# Patient Record
Sex: Female | Born: 1945 | Race: White | Hispanic: No | Marital: Married | Smoking: Never smoker
Health system: Southern US, Community
[De-identification: ages and names within clinical notes are randomized; demographics above are authoritative.]

## PROBLEM LIST (undated history)

## (undated) DIAGNOSIS — R7301 Impaired fasting glucose: Secondary | ICD-10-CM

## (undated) DIAGNOSIS — E782 Mixed hyperlipidemia: Secondary | ICD-10-CM

## (undated) DIAGNOSIS — I10 Essential (primary) hypertension: Secondary | ICD-10-CM

## (undated) DIAGNOSIS — D509 Iron deficiency anemia, unspecified: Secondary | ICD-10-CM

## (undated) DIAGNOSIS — Z972 Presence of dental prosthetic device (complete) (partial): Secondary | ICD-10-CM

## (undated) DIAGNOSIS — K219 Gastro-esophageal reflux disease without esophagitis: Secondary | ICD-10-CM

## (undated) DIAGNOSIS — R112 Nausea with vomiting, unspecified: Secondary | ICD-10-CM

## (undated) DIAGNOSIS — M1990 Unspecified osteoarthritis, unspecified site: Secondary | ICD-10-CM

## (undated) DIAGNOSIS — M4800 Spinal stenosis, site unspecified: Secondary | ICD-10-CM

## (undated) DIAGNOSIS — Z9889 Other specified postprocedural states: Secondary | ICD-10-CM

## (undated) DIAGNOSIS — H269 Unspecified cataract: Secondary | ICD-10-CM

## (undated) DIAGNOSIS — M549 Dorsalgia, unspecified: Secondary | ICD-10-CM

## (undated) DIAGNOSIS — I639 Cerebral infarction, unspecified: Secondary | ICD-10-CM

## (undated) DIAGNOSIS — G8929 Other chronic pain: Secondary | ICD-10-CM

## (undated) HISTORY — PX: FRACTURE SURGERY: SHX138

## (undated) HISTORY — DX: Iron deficiency anemia, unspecified: D50.9

## (undated) HISTORY — DX: Mixed hyperlipidemia: E78.2

## (undated) HISTORY — DX: Essential (primary) hypertension: I10

## (undated) HISTORY — DX: Impaired fasting glucose: R73.01

## (undated) HISTORY — DX: Gastro-esophageal reflux disease without esophagitis: K21.9

## (undated) HISTORY — PX: TONSILLECTOMY: SUR1361

## (undated) HISTORY — DX: Unspecified osteoarthritis, unspecified site: M19.90

## (undated) HISTORY — DX: Spinal stenosis, site unspecified: M48.00

## (undated) HISTORY — PX: APPENDECTOMY: SHX54

---

## 1980-11-16 HISTORY — PX: ABDOMINAL HYSTERECTOMY: SHX81

## 2000-08-09 ENCOUNTER — Encounter: Payer: Self-pay | Admitting: Obstetrics and Gynecology

## 2000-08-09 ENCOUNTER — Encounter: Admission: RE | Admit: 2000-08-09 | Discharge: 2000-08-09 | Payer: Self-pay | Admitting: Obstetrics and Gynecology

## 2003-08-07 ENCOUNTER — Ambulatory Visit (HOSPITAL_COMMUNITY): Admission: RE | Admit: 2003-08-07 | Discharge: 2003-08-07 | Payer: Self-pay | Admitting: Family Medicine

## 2003-08-07 ENCOUNTER — Encounter: Payer: Self-pay | Admitting: Family Medicine

## 2004-07-07 ENCOUNTER — Ambulatory Visit (HOSPITAL_COMMUNITY): Admission: RE | Admit: 2004-07-07 | Discharge: 2004-07-07 | Payer: Self-pay | Admitting: Family Medicine

## 2004-07-10 ENCOUNTER — Encounter: Admission: RE | Admit: 2004-07-10 | Discharge: 2004-07-10 | Payer: Self-pay | Admitting: Family Medicine

## 2005-02-25 ENCOUNTER — Ambulatory Visit (HOSPITAL_COMMUNITY): Admission: RE | Admit: 2005-02-25 | Discharge: 2005-02-25 | Payer: Self-pay | Admitting: Family Medicine

## 2005-04-14 ENCOUNTER — Ambulatory Visit (HOSPITAL_COMMUNITY): Admission: RE | Admit: 2005-04-14 | Discharge: 2005-04-14 | Payer: Self-pay | Admitting: Family Medicine

## 2005-04-16 ENCOUNTER — Ambulatory Visit (HOSPITAL_COMMUNITY): Admission: RE | Admit: 2005-04-16 | Discharge: 2005-04-16 | Payer: Self-pay | Admitting: Family Medicine

## 2005-07-17 ENCOUNTER — Encounter: Admission: RE | Admit: 2005-07-17 | Discharge: 2005-07-17 | Payer: Self-pay | Admitting: Family Medicine

## 2006-07-21 ENCOUNTER — Encounter: Admission: RE | Admit: 2006-07-21 | Discharge: 2006-07-21 | Payer: Self-pay | Admitting: Family Medicine

## 2006-09-10 ENCOUNTER — Ambulatory Visit (HOSPITAL_COMMUNITY): Admission: RE | Admit: 2006-09-10 | Discharge: 2006-09-10 | Payer: Self-pay | Admitting: Family Medicine

## 2006-09-20 ENCOUNTER — Emergency Department (HOSPITAL_COMMUNITY): Admission: EM | Admit: 2006-09-20 | Discharge: 2006-09-20 | Payer: Self-pay | Admitting: Emergency Medicine

## 2007-08-09 ENCOUNTER — Encounter: Admission: RE | Admit: 2007-08-09 | Discharge: 2007-08-09 | Payer: Self-pay | Admitting: Family Medicine

## 2008-04-16 HISTORY — PX: ESOPHAGOGASTRODUODENOSCOPY: SHX1529

## 2008-04-16 HISTORY — PX: COLONOSCOPY: SHX174

## 2008-04-23 ENCOUNTER — Encounter: Admission: RE | Admit: 2008-04-23 | Discharge: 2008-04-23 | Payer: Self-pay | Admitting: Orthopedic Surgery

## 2008-05-01 ENCOUNTER — Ambulatory Visit: Payer: Self-pay | Admitting: Gastroenterology

## 2008-05-01 ENCOUNTER — Ambulatory Visit (HOSPITAL_COMMUNITY): Admission: RE | Admit: 2008-05-01 | Discharge: 2008-05-01 | Payer: Self-pay | Admitting: Internal Medicine

## 2008-05-11 ENCOUNTER — Encounter: Payer: Self-pay | Admitting: Internal Medicine

## 2008-05-11 ENCOUNTER — Ambulatory Visit (HOSPITAL_COMMUNITY): Admission: RE | Admit: 2008-05-11 | Discharge: 2008-05-11 | Payer: Self-pay | Admitting: Internal Medicine

## 2008-05-11 ENCOUNTER — Ambulatory Visit: Payer: Self-pay | Admitting: Internal Medicine

## 2008-06-15 ENCOUNTER — Encounter (HOSPITAL_COMMUNITY): Admission: RE | Admit: 2008-06-15 | Discharge: 2008-07-15 | Payer: Self-pay | Admitting: Internal Medicine

## 2008-06-19 ENCOUNTER — Ambulatory Visit: Payer: Self-pay | Admitting: Internal Medicine

## 2008-08-09 ENCOUNTER — Encounter: Admission: RE | Admit: 2008-08-09 | Discharge: 2008-08-09 | Payer: Self-pay | Admitting: Family Medicine

## 2008-11-15 ENCOUNTER — Ambulatory Visit (HOSPITAL_COMMUNITY): Admission: RE | Admit: 2008-11-15 | Discharge: 2008-11-16 | Payer: Self-pay | Admitting: Specialist

## 2008-11-16 HISTORY — PX: BACK SURGERY: SHX140

## 2009-04-05 ENCOUNTER — Encounter (INDEPENDENT_AMBULATORY_CARE_PROVIDER_SITE_OTHER): Payer: Self-pay | Admitting: *Deleted

## 2009-11-28 ENCOUNTER — Encounter: Admission: RE | Admit: 2009-11-28 | Discharge: 2009-11-28 | Payer: Self-pay | Admitting: Internal Medicine

## 2010-12-06 ENCOUNTER — Other Ambulatory Visit: Payer: Self-pay | Admitting: Internal Medicine

## 2010-12-06 DIAGNOSIS — Z1239 Encounter for other screening for malignant neoplasm of breast: Secondary | ICD-10-CM

## 2010-12-07 ENCOUNTER — Encounter: Payer: Self-pay | Admitting: Family Medicine

## 2011-01-16 ENCOUNTER — Ambulatory Visit
Admission: RE | Admit: 2011-01-16 | Discharge: 2011-01-16 | Disposition: A | Discharge status: Home / Self Care | Payer: Medicare Other | Source: Ambulatory Visit | Attending: Internal Medicine | Admitting: Internal Medicine

## 2011-01-16 DIAGNOSIS — Z1239 Encounter for other screening for malignant neoplasm of breast: Secondary | ICD-10-CM

## 2011-01-26 ENCOUNTER — Emergency Department (HOSPITAL_COMMUNITY): Payer: Medicare Other

## 2011-01-26 ENCOUNTER — Inpatient Hospital Stay (HOSPITAL_COMMUNITY)
Admission: EM | Admit: 2011-01-26 | Discharge: 2011-01-27 | DRG: 287 | Disposition: A | Discharge status: Home / Self Care | Payer: Medicare Other | Attending: Cardiovascular Disease | Admitting: Cardiovascular Disease

## 2011-01-26 ENCOUNTER — Inpatient Hospital Stay: Admission: AD | Admit: 2011-01-26 | Payer: Self-pay | Source: Ambulatory Visit | Admitting: Cardiovascular Disease

## 2011-01-26 DIAGNOSIS — R0789 Other chest pain: Principal | ICD-10-CM | POA: Diagnosis present

## 2011-01-26 DIAGNOSIS — E669 Obesity, unspecified: Secondary | ICD-10-CM | POA: Diagnosis present

## 2011-01-26 DIAGNOSIS — E785 Hyperlipidemia, unspecified: Secondary | ICD-10-CM | POA: Diagnosis present

## 2011-01-26 DIAGNOSIS — Z8249 Family history of ischemic heart disease and other diseases of the circulatory system: Secondary | ICD-10-CM

## 2011-01-26 LAB — MRSA PCR SCREENING: MRSA by PCR: NEGATIVE

## 2011-01-26 LAB — COMPREHENSIVE METABOLIC PANEL
AST: 19 U/L (ref 0–37)
Alkaline Phosphatase: 73 U/L (ref 39–117)
BUN: 17 mg/dL (ref 6–23)
CO2: 25 mEq/L (ref 19–32)
Chloride: 103 mEq/L (ref 96–112)
GFR calc non Af Amer: >60 mL/min (ref >60)
Glucose, Bld: 100 mg/dL — ABNORMAL HIGH (ref 70–99)
Sodium: 137 mEq/L (ref 135–145)
Total Protein: 7.2 g/dL (ref 6.0–8.3)

## 2011-01-26 LAB — BASIC METABOLIC PANEL
Chloride: 107 mEq/L (ref 96–112)
GFR calc Af Amer: >60 mL/min (ref >60)
Sodium: 138 mEq/L (ref 135–145)

## 2011-01-26 LAB — DIFFERENTIAL
Eosinophils Absolute: 0 10*3/uL (ref 0.0–0.7)
Lymphocytes Relative: 30 % (ref 12–46)
Lymphs Abs: 1.6 10*3/uL (ref 0.7–4.0)
Monocytes Relative: 6 % (ref 3–12)
Neutro Abs: 3.3 10*3/uL (ref 1.7–7.7)
Neutrophils Relative %: 63 % (ref 43–77)

## 2011-01-26 LAB — CK TOTAL AND CKMB (NOT AT ARMC): Total CK: 77 U/L (ref 7–177)

## 2011-01-26 LAB — POCT CARDIAC MARKERS
CKMB, poc: <1 ng/mL — ABNORMAL LOW (ref 1.0–8.0)
Myoglobin, poc: 55.9 ng/mL (ref 12–200)
Troponin i, poc: <0.05 ng/mL (ref 0.00–0.09)

## 2011-01-26 LAB — CBC
HCT: 37.2 % (ref 36.0–46.0)
Hemoglobin: 12.2 g/dL (ref 12.0–15.0)
MCH: 27.2 pg (ref 26.0–34.0)
MCHC: 32.8 g/dL (ref 30.0–36.0)

## 2011-01-26 LAB — CARDIAC PANEL(CRET KIN+CKTOT+MB+TROPI): CK, MB: 1.5 ng/mL (ref 0.3–4.0)

## 2011-01-27 LAB — PROTIME-INR
INR: 1 (ref 0.00–1.49)
Prothrombin Time: 13.4 seconds (ref 11.6–15.2)

## 2011-01-27 LAB — CBC
Hemoglobin: 11.6 g/dL — ABNORMAL LOW (ref 12.0–15.0)
MCH: 26.6 pg (ref 26.0–34.0)
RBC: 4.36 MIL/uL (ref 3.87–5.11)

## 2011-01-27 LAB — CARDIAC PANEL(CRET KIN+CKTOT+MB+TROPI)
Relative Index: INVALID (ref 0.0–2.5)
Total CK: 53 U/L (ref 7–177)
Troponin I: <0.01 ng/mL (ref 0.00–0.06)

## 2011-01-27 LAB — BASIC METABOLIC PANEL
CO2: 24 mEq/L (ref 19–32)
Calcium: 8.7 mg/dL (ref 8.4–10.5)
GFR calc Af Amer: >60 mL/min (ref >60)
GFR calc non Af Amer: >60 mL/min (ref >60)
Sodium: 136 mEq/L (ref 135–145)

## 2011-01-28 NOTE — Procedures (Signed)
[name], [name] NO.:  [id number]  MEDICAL RECORD NO.:  [id number]          PATIENT TYPE:  LOCATION:                                 FACILITY:  PHYSICIAN:  Landry Corporal, M DDATE OF BIRTH:  [birth year]  DATE OF PROCEDURE:  01/27/2011 DATE OF DISCHARGE:                           CARDIAC CATHETERIZATION   REFERRING CARDIOLOGIST:  Gerlene Burdock A. Alanda Amass, MD  PRIMARY CARE PHYSICIAN:  Shelva Majestic, MD  PERFORMING PHYSICIAN:  Landry Corporal, MD  PROCEDURE PERFORMED: 1. Left heart catheterization via 5-French right radial access. 2. Left ventriculogram in the RAO projection with 10 mL/sec for 30     seconds. 3. Native coronary angiography.  INDICATIONS:  Chest pain concerning for unstable angina.  BRIEF HISTORY:  [name] is a pleasant 65 year old woman with borderline hyperlipidemia, borderline hyperglycemia/diabetes and hypertension, who was transferred from [facility] after being evaluated by Dr. Susa Griffins in consultation for episodes of chest pain at rest and minimal exertion over the last several days.  These symptoms were concerning for unstable angina and therefore she was referred for diagnostic cardiac catheterization.  The risks, benefits, alternatives, indications of the procedure were explained to the patient in detail and informed consent was obtained and signed was placed on the chart.  PROCEDURE:  The patient was brought to the [facility] in the fasting state.  She was prepped and draped in usual sterile fashion.  After a modified Allen's / Barbeau test was performed to evaluate for collateral circulation to the right hand via the ulnar artery.  As this was deemed to be sufficient collateral flow, the right radial artery access site was prepped.  A time-out period was performed and the patient was sedated with intravenous Versed and fentanyl.  The right radial artery  was then accessed using the Seldinger technique with placement of 5-French sheath.  Sheath was then aspirated, flushed and then infiltrated with total of 10 mL of saline with radial cocktail.  An ACT was checked prior to doing this and was noted to be 128 seconds and therefore a total of 45 units of intravenous heparin was administered at the time of sheath placement.  After the heparin was administered, a 5-French TIG 4-L catheter was advanced over the safety J- wire into the ascending aorta.  It was then used to engage the right coronary artery and the catheter was then redirected and then the left main coronary artery and multiple angiographic views of the both main coronary artery system were obtained.  After completion of the left ventricular angiography, the catheter was exchanged over wire for a 6- French straight pigtail catheter which was advanced over the wire across the aortic valve for measurement left ventricular hemodynamics.  After the hemodynamics were measured, the left ventriculogram was performed in the RAO projection.  The catheter was then aspirated and flushed and left ventricular hemodynamics were remeasured and the catheter was pulled back across the aortic valve for measuring pullback gradient.  The catheter was then removed completely out of the body over wire without any complications.  A  TR band was placed in the cath lab initially it was placed.  There was noted to be a proximal small hematoma forming and the more proximal hemostasis was obtained manually and the TR band was then repositioned more proximally up the forearm and adequate hemostasis was then obtained.  This was monitored in the holding area and was not noted to getting any larger and was actually diminishing in size prior to the patient going to the radial lounge. Otherwise, the patient was stable for during and after the procedure. There was no other significant complications.  Estimated blood  loss less 10 mL.  __________ Sedation 1 mg IV Versed, 200 mcg IV fentanyl.  CONTRAST: 1. 110 mL. 2. 4000 heparin units. 3. Radial cocktail:  400 mcg of nitroglycerin, 2 mL 1% of lidocaine, 5     mg of verapamil and 10 mL of normal saline.  HEMODYNAMICS: 1. Aortic pressure 131/77 mmHg with a mean of 101 mmHg. 2. Left ventricular pressure 135/11 mmHg with EDP of 60/mmHg.  No     pullback gradient. 3. LV gram showed EF showed 60 with no wall motion abnormalities.  ANGIOGRAPHIC FINDINGS: 1. The right coronary artery is a large-caliber dominant vessel gives     rise to posterior ascending artery and extensive posterolateral     branch.  The artery is smooth and large caliber throughout this     entire course to crux and has no significant disease in the entire     vessel system besides diffuse distal minor luminal irregularities. 2. The left main artery is a large-caliber vessel which bifurcates     into an LAD and circumflex branch. 3. The LAD is a large-caliber vessel which tapers down to around the     apex.  It gives rise to a very proximal first diagonal branch which     gives was as above 30% ostial lesion.  There are two small septal     perforators right after this and after the third septal perforator,     there is a second diagonal branch that is relatively small in     caliber, but no significant disease.  The remainder of the LAD     itself has no significant disease.  There is some slight     calcification in the mid vessel at the first diagonal branch. 4. Circumflex is a moderate caliber vessel and the ostial portion in     it bifurcates into two branches.  The first the more dominant of     which is the obtuse marginal which reached down to the inferior     wall of the heart.  The atrioventricular groove circumflex actually     goes in the same distribution as that of obtuse marginal and then     curves back on the AV groove, but does not provide much distal      circulation just to small posterolateral branches  IMPRESSION: 1. No angiographic evidence of any significant obstructive coronary     artery explains her chest pain for admission.  I would likely then     consider non-ACS/nonanginal chest pain as the source for chest     pain.  Consider GI evaluation. 2. Small right forearm hematoma upon initial sheath removal, almost     diminished to nothing shortly thereafter and stable with TR     repositioning.  PLAN:  Likely discharge this afternoon after standard TR band evaluation.  She will follow up with Dr.  Margo Aye and Dr. Alanda Amass or another physician in [facility].          ______________________________ Landry Corporal, MD     DWH/MEDQ  D:  01/27/2011  T:  01/28/2011  Job:  [id number]  cc:   Gerlene Burdock A. Alanda Amass, M.D. Dr. Margo Aye  Electronically Signed by Bryan Lemma MD on 01/28/2011 12:00:51 PM

## 2011-02-09 NOTE — Discharge Summary (Signed)
  [name], May                 ACCOUNT NO.:  [id number]  MEDICAL RECORD NO.:  [id number]           PATIENT TYPE:  I  LOCATION:  6522                         FACILITY:  [facility]  PHYSICIAN:  Nariyah Osias A. Alanda Amass, M.D.DATE OF BIRTH:  [birth year]  DATE OF ADMISSION:  01/26/2011 DATE OF DISCHARGE:  01/27/2011                              DISCHARGE SUMMARY   DISCHARGE DIAGNOSES: 1. Chest pain worrisome for unstable angina, catheterization this     admission revealing minor coronary artery disease with a 30%     diagonal stenosis, but no other significant disease. 2. Good left ventricular function. 3. Dyslipidemia with an LDL of 174. 4. Family history of coronary artery disease.  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  She saw Dr. Alanda Amass in [place] on January 26, 2011, with complaints of chest pain worrisome for unstable angina.  Please see his history and physical for complete details.  She was actually sent from [place] to [facility] for admission for further evaluation.  She was put on heparin and nitrates because she was still having some discomfort.  Her enzymes were negative.  She was sent for diagnostic catheterization January 27, 2011, which was done by Dr. Herbie Baltimore.  I used a right radial artery approach. She had minor disease in the proximal diagonal, but no other significant coronary artery disease.  Her EF was preserved.  She did have a small amount of ecchymosis around the right radial artery site, but had good pulse.  We feel she can be discharged later on the 13th.  LABORATORY DATA:  CK-MB and troponin's were negative x3.  Liver functions were normal.  Sodium 136, potassium 3.8, BUN 18, creatinine 0.85, INR 1.08, white count 5.6, hemoglobin 11.6, hematocrit 36.2, platelets 183, D-dimer is less than 0.22.  Chest x-ray with no acute abnormality.  EKG shows sinus rhythm without acute  changes.  DISPOSITION:  The patient is discharged in stable condition and will follow up with Dr. Margo Aye and Dr. Alanda Amass.  We have added PPI and statin.  Please see med rec for complete discharge medications.  She will see [place] back in the [place] office in a week or two.     [name], P.A.   ______________________________ Pearletha Furl Alanda Amass, M.D.    Lenard Lance  D:  01/27/2011  T:  01/28/2011  Job:  [id number]  cc:   Gerlene Burdock A. Alanda Amass, M.D. Catalina Pizza, M.D.  Electronically Signed by Corine Shelter P.A. on 01/28/2011 03:09:42 PM Electronically Signed by Susa Griffins M.D. on 02/09/2011 01:37:39 PM

## 2011-03-31 NOTE — Op Note (Signed)
[name], [name]                 ACCOUNT NO.:  [id number]   MEDICAL RECORD NO.:  [id number]          PATIENT TYPE:  OIB   LOCATION:  2550                         FACILITY:  [facility]   PHYSICIAN:  Kerrin Champagne, M.D.   DATE OF BIRTH:  [birth year]   DATE OF PROCEDURE:  11/15/2008  DATE OF DISCHARGE:                               OPERATIVE REPORT   PREOPERATIVE DIAGNOSIS:  Large central herniated nucleus pulposus, L4-5.   POSTOPERATIVE DIAGNOSIS:  Large central herniated nucleus pulposus, L4-  5.   PROCEDURE:  Bilateral microdiskectomy, L4-5 with excision of large  herniated nucleus pulposus and extruded fragments.  An operating room  microscope was used during this procedure.   SURGEON:  Kerrin Champagne, M.D.   ASSISTANT:  Wende Neighbors, PA-C.   ANESTHESIA:  General via orotracheal intubation by Dr. Judie Petit  supplemented with local infiltration with Marcaine 0.5% with 1:200,000  epinephrine 10 mL subcu skin, additional 10 mL deep.   FINDINGS:  Bilateral L5 nerve root entrapment and compression secondary  to a large disk herniation at L4-5 with at least 5-8 fragments of disk  material excised.   ESTIMATED BLOOD LOSS:  75 mL.   COMPLICATIONS:  None.   The patient returned to the PACU in good condition.   HISTORY OF PRESENT ILLNESS:  This patient is 65 year old female who has  had no significant history of back pain problems up until approximately  6 weeks ago when she began to experience increasing back pain and  discomfort.  This has worsened over the last 2 weeks to the point where  she was having difficulty with standing or walking any length of time  and now pain with sitting, standing, walking, or lying.  Radiation to  lower extremities.  Clinical exam shows weakness on dorsiflexion of the  feet both sides as well as great toe extension.  She underwent  evaluation by Dr. Burnard Bunting.  MRI study demonstrated a very large  central disk herniation at L4-5 with  compression of the thecal sac  bilateral L5 and bilateral L4 nerve roots.  She was brought to the  operating room because of concerns of rather severe spinal stenosis with  weakness in the lower extremities and concerns of inability to tolerate  pain to nerve compression intractable pain.  Intraoperative findings as  above.   DESCRIPTION OF THE PROCEDURE:  After adequate general anesthesia, the  patient noted to have a rash, felt to be related to muscle relaxer use  during induction.  The patient had standard preoperative antibiotics  with Ancef.  She was rolled to a prone position using a Wilson frame and  reverse Skytron bed.   A standard prep with DuraPrep solution of the lower dorsal spine to the  upper sacral level, draped in the usual manner, iodine Vi-Drape was  used.  Legs were wrapped with TED stockings and PAS hose were used to  prevent DVT.   All pressure points were well padded.  Standard preoperative antibiotics  of Ancef were given.  It was felt that this did not cause  the patient's  rash initially.  Standard preoperative protocol marked in the scheduled  procedure level.  This was carried out marking on the right side with  excellent initials.  Also, a standard intraoperative time-out  identifying the patient, procedure to be performed, and expected  participants, any significant concerns.  The patient had incision made  at the expected L4 level standard superiorly and inferiorly  approximately 1/2 inch to 3/4 of an inch to each direction through the  skin and subcutaneous layers in the midline, carried down to the spinous  process of L4.  The subcu layers infiltrate markings with Marcaine 0.5%  with 1:200,000 epinephrine.  Electrocautery used to incise the  lumbodorsal fascia along the insertion into the lateral aspect of the  spinous process of L4, carried upwards to L3-4, and inferiorly to the  L5.  Clamp placed over the spinous process of L4.  Intraoperative   lateral radiograph demonstrated clamp on the spinous process of L4.  Clamps were then used to elevate the paralumbar muscles off the  posterolateral aspects of the spinous process of L4 and L5, and over the  posterior aspect of the interlaminar region at the L4-5 level on both  sides.  A McCullough retractor was inserted.  Bleeder was controlled  using bipolar monopolar electrocautery.  Using loupe magnification head  lamp initially, a high-speed bur was used to remove the small portion of  the inferior aspect of the lamina of the L4 bilaterally pinning this to  the deeper cortex.   A 2-3 mm Kerrisons were used to resect residual amount of deeper cortex  at the inferior aspect of the lamina on both sides, small amount at the  medial aspect of the facet at the L4-5 level was resected bilaterally  approximately 10-15% on each side to gain access to the spinal canal.  The ligamentum flavum identified and then carefully retracted and  resected off the superior portion of the lamina of L5.  Foraminotomy  performed over both the L5 nerve roots.  Ligamentum flavum was then  resected off the medial aspect of the facet at the L4-5 bilaterally, and  resected at its area of insertion into the ventral aspect of the  inferior portion of the lamina of L4, both bilaterally.  Thecal sac  easily identified and visualized.  Lateral recesses were decompressed  using 2-3 mm Kerrison and resected a small amount of the superior  articular process of L5, both bilaterally within the lateral recess back  to the medial aspect of the pedicle of L5, both sides.  The L5 nerve  root was then able to be identified bilaterally.  The operating room  microscope carefully, draped sterilely, brought into the field.   Under the operating room microscope, beginning on the right side thecal  sac was retracted immediately and carefully using a Penfield 4, and the  L4-5 herniated disc material identified.  An initial incision  was made  in the posterior longitudinal ligament, just teased out of the  subligamentous region using a nerve hook as well as a larger nerve hook  used to remove very large fragments of herniated disc material.  It  appeared to be degenerated here.  The 3 or 4 large fragments were  resected carefully, then freed up the L5 in the lateral aspect of the  thecal sac line falling for better retraction.  Immediately and further  inspection of the disk posteriorly.  Subligamentous wise, the disk was  excised on the right side  on the lamina of the disk space on the right  side due to a disk space narrowing posteriorly.   Large fragments were resected on this side and then probing using large  neuro probe and further fragments that were freed were able to be  introduced into the open laminotomy region resected using  micropituitary.  At this time, thrombin-soaked Gelfoam was placed on the  lateral recess.  Bone wax applied to the bleeding cancellous surface on  the right side at L4-5.  Attention turned to the left side where using  operating room microscope and the L5 nerve root was identified and  retracted immediately along with the lateral aspect of thecal sac at the  L4-5 level.  This noted to be herniated persistently on the left side,  so a 15-blade scalpel was used to incise the disk.  Nerve hook used to  resect free disk material, and that was located on subligamentous plane.  Pituitary rongeur was used to debride the degenerated disc material, and  a rent into the posterior left side of the disk was identified as  posterior annular tear.  This was debrided using the straight  micropituitaries entering the disk space, removing in a small retained  fragments that were loose or free.   Additional exploration of the canal on the left side identified 2 or 3  small fragments of the disk material that remained free on this side.  The L5 nerve root was noted to be exiting without further  nerve  compression.  The L4 nerve root noted to still have some amount of  compression within the neural foramen such that the reflected portion of  the ligamentum flavum was resected using 2-mm Kerrsions up to the area  of fat surrounding the L4 nerve root.  Hockey-stick neuro probe could  then be passed out the right neural foramen and the left neural foramen  of L4 without difficulty.  Following this, then careful inspection of  the canal on the left side demonstrated no further free fragments or  disk material remaining that could be causing nerve root compression.  Bleeder was controlled using bipolar electrocautery and thrombin-soaked  Gelfoam.  Bone wax applied to the bleeding cancellous bone surfaces.  Attention then replaced on the right side and changing to the other side  of the operating table.   Thecal sac was carefully retracted immediately over a very large  fragment that apparently had caused to migrate to this side, was then  removed using a pituitary rongeur.  Additional exploration above the  disk space into the right undersurface of the L4 nerve root identified  some further disk material and was resected using micropituitary.  With  this, there was no further disk material found within the spinal canal,  irrigation was carried out.  Careful inspection of the disk posteriorly  on the right side demonstrated no rents within the disk area.  No area  of entry into the disk space.  The L5 nerve root and L4 nerve root noted  to be exiting without further nerve compression.  Thrombin-soaked  Gelfoam was placed.  Irrigation carried out and the incision was felt to  be completely dry, and the areas of Gelfoam were removed.   Inspection demonstrated no active bleeding at this point.  Irrigation  was carried out.  The incision allowed to fall back into place.  The  lumbar dorsal fascia was then approximated in the midline to the  supraspinous ligament using interrupted 0  Vicryl with UR needle.  Deep  subcutaneous layer approximated with interrupted 0 Vicryl sutures, more  superficial layers with interrupted 2-0 Vicryl sutures, and skin closed  with a running subcu stitch of 4-0 Vicryl.  The patient then had  Dermabond applied, 4 x 4s fixed to the skin with Hypafix tape.  The  patient was then returned to supine position, reactivated, extubated,  and returned to the recovery room in satisfactory condition.  All  instruments and sponge counts were correct.      Kerrin Champagne, M.D.  Electronically Signed     JEN/MEDQ  D:  11/15/2008  T:  11/16/2008  Job:  [id number]

## 2011-03-31 NOTE — Assessment & Plan Note (Signed)
NAMECHRYSTIE, [name]                  CHART#:  [id number]   DATE:  05/01/2008                       DOB:  Mar 29, 1946   CHIEF COMPLAINT:  Right-sided abdominal pain and right back pain, nausea  and vomiting.   PHYSICIAN REQUESTING CONSULTATION:  Melvyn Novas, MD   HISTORY OF PRESENT ILLNESS:  The patient is a 65 year old Caucasian  female who presented for further evaluation of several-month history of  posterior chest pain, right flank pain, and now right-sided abdominal  pain.  She states she has had this pain before back in 2007.  At that  time, she underwent a CT without contrast to rule out kidney stones.  This revealed some minimal right lower lobe atelectasis and duodenal  diverticulum, but no nephrolithiasis or hydronephrosis.  She states that  at that time, the pain lasted about 4 weeks.  In February, she had  recurrence of this pain.  She states this time, the pain has not let up.  She has pain constantly.  It is progressively worsening.  Initially, she  noted it just when she was lying down.  She tends to like to sleep on  her right side, but has been unable to do so.  She has been sleeping in  a recliner for over 2 months now.  Now, pain has been more constant, she  has it during the day, and it is unrelated to position.  She did  initially see a neurosurgeon, Dr. Lajoyce Corners, whom her husband sees.  She  states she had MRI and x-rays, which were unremarkable.  She also had an  abdominal ultrasound, which was negative.  She tried a 2-week course of  pain pills for possible pulled muscle, but this did not work.  She also  was on Celebrex at some point, which did not seem to help.  Over the  last one month, she started having more nausea.  When she wakes up in  the morning, she had a lot of nausea and has to eat crackers.  On one  occasion, she woke up at 1 a.m. and vomited.  She has postprandial  nausea.  For the last 3 to 4 days, it has been a lot worse.  She has  been  having heartburn and ingestion for about a week, which is new.  She  has been taking Alka-Seltzer.  She denies any dysphagia or odynophagia.  She has a bowel movement every morning.  A couple of times, she has had  some loose stools, but nothing significant.  Denies any blood in the  stool or melena.  She is quite frustrated.  She denies any weight loss.   MEDICATIONS:  None.   ALLERGIES:  No known drug allergies.   PAST MEDICAL HISTORY:  Hypercholesterolemia, diet controlled.   PAST SURGICAL HISTORY:  Colonoscopy in December 2001 by Dr. Karilyn Cota.  She  had a sigmoid colon polyp, which was inflammatory.  She has small  external hemorrhoids.  She had a hysterectomy in 1981.  She had surgery  on her arm in 1974.   FAMILY HISTORY:  Two maternal aunts had colon cancer.  Mother is 44 and  has hypertension.  Father died at age 38.  History of MI.   SOCIAL HISTORY:  She is married, has 1 child.  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  Never been a smoker.  No alcohol use.   REVIEW OF SYSTEMS:  See HPI for GI.  CONSTITUTIONAL:  See HPI.  CARDIOPULMONARY:  She complains of chest pain, especially on the right  side with a deep breath.  She really has not had any dyspnea on  exertion.  No cough.  GENITOURINARY:  No dysuria or hematuria.   PHYSICAL EXAMINATION:  VITALS:  Weight 202, height 5 feet 4 inches,  temperature 98.1, blood pressure 118/84, and pulse 72.  GENERAL:  Pleasant, elderly Caucasian female, who is tearful during much  of the exam.  SKIN:  Warm and dry.  No jaundice.  HEENT:  Sclera nonicteric.  Conjunctivae is moist and pink.  CHEST:  Lungs are clear to auscultation.  CARDIAC:  Reveals regular rate and rhythm.  No murmurs, rubs, or  gallops.  BACK:  Cannot reproduce her symptoms with palpation of the back or along  the rib cage margin.  ABDOMEN:  Positive bowel sounds.  Obese.  She has tenderness noted  throughout the right abdomen down the right mid and a little bit down  into  the right pelvis.  Mostly in the right mid and right upper quadrant  region, however.  No rebound or guarding.  No organomegaly or masses.  No abdominal bruits or hernias in the lower extremities.  No edema.   IMPRESSION:  The patient is a 65 year old lady with a 63-month history of  progressive, now constant pain, which is located in the right posterior  chest, right flank, and right side of the abdomen.  Over the last couple  of weeks, she has developed some nausea and one episode of vomiting as  well as reflux symptoms.  I think it will be hard to explain all of her  symptoms based on the upper GI source.  She really denies any urinary  tract symptoms.  Abdominal ultrasound was unremarkable recently.  Etiology not well defined at this point, but I discussed with her today  given the chronicity and progression of these symptoms,  would consider  CT of the chest, abdomen, as well as the pelvis to further evaluate her  pain.  If this was unremarkable, we would pursue EGD prior to any  further evaluation of gallbladder disease.  Her symptoms do not sound  entirely biliary.  She does have some postprandial symptoms, but her  symptoms are not always related to meals.   PLAN:  1. CT of the chest, abdomen, and pelvis.  2. CBC and CMET.  3. Trial of Kapidex 60 mg daily, #27 pills provided.  4. Further recommendations to follow.       Tana Coast, P.A.  Electronically Signed     R. Roetta Sessions, M.D.  Electronically Signed    LL/MEDQ  D:  05/01/2008  T:  05/02/2008  Job:  [id number]   cc:   Melvyn Novas, MD

## 2011-03-31 NOTE — Op Note (Signed)
[name], CORDIAL                 ACCOUNT NO.:  [id number]   MEDICAL RECORD NO.:  [id number]          PATIENT TYPE:  AMB   LOCATION:  DAY                           FACILITY:  [facility]   PHYSICIAN:  R. Roetta Sessions, M.D. DATE OF BIRTH:  Feb 18, 1946   DATE OF PROCEDURE:  05/11/2008  DATE OF DISCHARGE:                               OPERATIVE REPORT   EGD with biopsy followed by ileal colonoscopy and snare polypectomy,  biopsy.   INDICATIONS FOR PROCEDURE:  A 62-year lady with a 47-month history of  progressive right posterior chest, right flank, right-sided abdominal  pain, symptoms gradually worsened.  We have seen her, she underwent a  chest, abdominal and pelvic CT scanning.  Previously underwent an MRI of  spine.  Tiny lesion in liver consistent with hemangioma.  Tiny lung  lesion for which 1 year followup CT was recommended (nonsmoker).  She  tells me that 5 days ago, the above-mentioned symptoms RESOLVED.  She  states, she had similar symptoms back in October which were more short  lived at that time, but for the past 5 days she has not had any symptoms  whatsoever.  She has Kapidex on hand, but has not started taking this  agent as of yet.  She previously had an abdominal ultrasound also which  demonstrated no gallbladder abnormalities.  It has been early 10 years  since she last had a colonoscopy.  EGD and colonoscopy now being done in  part for screening and in part to further evaluate her recent symptoms.  This approach has been discussed with the patient at length.  Risks,  benefits, and alternatives have been reviewed, questions answered.  She  is agreeable.  Please see documentation in medical record.   PROCEDURE NOTE:  O2 saturation, blood pressure, pulse, and respirations  monitored throughout entire procedure.  Conscious sedation, Versed 7 mg,  IV Demerol 125 mg IV in divided doses.   INSTRUMENT:  Pentax video chip system.   FINDINGS:  Examination of the tubular  esophagus revealed a 4-5 mm  circumferential distal esophageal erosions trailing the EG junction.  Esophageal mucosa otherwise appeared normal.  EG junction easily  traversed.  Stomach:  Gastric cavity was emptied and insufflated well  with air.  Thorough examination of the gastric mucosa including  retroflexion at the proximal stomach and esophagogastric junction  demonstrated a small hiatal hernia in 2 x 3 cm of antral scar versus  early adenomatous change.  There are couple of tiny associated erosions  as well.  I did not see anything looking like infiltrating process.  There was no ulcer.  Pylorus patent and easily traversed.  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  Otherwise, duodenal mucosa appeared normal.  Therapeutic/diagnostic maneuvers performed:  The area of scar versus  antrum was biopsied for histologic study.  The patient tolerated the  procedure well and was prepared for colonoscopy.  Digital rectal exam  revealed no abnormalities.  Endoscopic findings:  The prep was good.  Colon:  Colonic  mucosa was surveyed from the rectosigmoid junction  through the left transverse right colon appendiceal orifice, ileocecal  valve, and cecum.  These structures well seen and photographed for the  record.  Terminal ileum was intubated to 10 cm from this level.  The  scope was slowly and cautiously withdrawn.  All previously mentioned  mucosal surfaces were again seen.  The patient was noted to have a 5-mm  polyp at the splenic flexure, which was cold snared, recovered through  the scope.  There was a diminutive polyp at the base of the cecum which  was cold biopsied/removed.  The remainder of the colonic mucosa as well  as terminal ileal mucosa appeared normal.  The scope was pulled down to  the rectum with examination of  rectal mucosa including retroflex view  of the anal verge demonstrated anal papilla and internal  hemorrhoids  only.  The patient tolerated both procedures well and was reactive to  endoscopy.   IMPRESSION:  1. Esophagogastroduodenoscopy.  Circumferential distal esophageal      erosions consistent with mild erosive reflux esophagitis, small      hiatal hernia, antral erosions, any areas of scar versus      adenomatous changes described above status post biopsy.  Otherwise      unremarkable stomach.  D1 and D2 unremarkable aside from D2      diverticulum.  2. Colonoscopy findings:  Anal papilla and internal hemorrhoids,      otherwise normal rectum.  3. Polyp at the splenic flexure removed with snare.  Diminutive polyp      in the cecum removed with cold biopsy technique.  Remainder of the      colonic mucosa, terminal ileum, mucosa appeared normal.   The patient's symptoms have not been present for the past 5 days.  Her  symptoms are atypical for any one GI entity.  She could have occult  gallbladder disease.  She certainly does have an element of erosive  reflux esophagitis.   RECOMMENDATIONS:  1. We will follow up pathology on polyps taken today.  She has Kapidex      on hand, I will ask her to  start taking Kapidex 60 mg orally once      daily.  She has samples for nearly a month.  We will bring her back      in 1 month and see how she is doing as a separate issue.  She will      need chest CT in 1 year to follow up on the most likely benign      lesions seen on recent chest CT scan.  2. Followup appointment with [place] in less than 1 month to see how she is      doing.  3. If she has any recurrence of symptoms, would give some      consideration to HIDA scan with fatty meal challenge.  4. The inflammatory changes in the antrum, they maybe related to      recent course of Celebrex per the record.      Jonathon Bellows, M.D.  Electronically Signed     RMR/MEDQ  D:  05/11/2008  T:  05/12/2008  Job:  [id number]   cc:   Melvyn Novas, MD  Fax: [phone]

## 2011-03-31 NOTE — Assessment & Plan Note (Signed)
[name], [name]                  CHART#:  [id number]   DATE:  06/19/2008                       DOB:  [birth year]   Followup, right flank pain and history of erosive reflux esophagitis.   The patient returns.  She is really doing well.  She took Kapidex for a  week and developed a rash, for which she was given some steroid shots by  Dr. Janna Arch and it resolved.  She is taking her friend's Nexium, with  good control of her symptoms.  Flank pain has resolved.  She had a small  hemangioma on recent CT and nonspecific pulmonary lesions, for which  followup CT in 1 year was recommended.  She has a history of adenomatous  polyps and needs colonoscopy in 5 years.  HIDA scan demonstrated good  gallbladder EF, 63%, no reproduction of symptoms.  Overall, the patient  is doing very well clinically at this point in time.   CURRENT MEDICATIONS:  None.   ALLERGIES:  Most likely now Kapidex but adds Prevacid to the list.  I  doubt she developed the rash related to IV Demerol and Versed for the  endoscopic procedures.   PHYSICAL EXAMINATION:  GENERAL:  Today, appears well.  VITAL SIGNS:  Weight 199, temperature 98.3, BP 118/82, and pulse 64.  SKIN:  Warm and dry.  CHEST:  Lungs are clear to auscultation.  CARDIAC:  Regular rate and rhythm without murmur, gallop, or rub.  ABDOMEN:  Nondistended, positive bowel sounds, soft, and nontender  without appreciable mass or organomegaly.   ASSESSMENT:  1. Etiology of her fleeting right flank pain has never been nailed      down.  However, negative results of multiple studies are      reassuring.  2. She does have erosive reflux esophagitis, needs to be on daily      suppression therapy.  She wants something like Nexium but wants a      generic, therefore we will give her a prescription for omeprazole      20 mg orally daily, #30 with 6 refills.  We will plan to see this      nice lady back in 3 months.  We will slate her for a chest CT in 1  year.  3. Colonoscopy in 5 years.  4. If she has any interim symptoms, she is to let us know immediately.       Jonathon Bellows, M.D.  Electronically Signed     RMR/MEDQ  D:  06/19/2008  T:  06/19/2008  Job:  [id number]   cc:   Melvyn Novas, MD

## 2011-08-21 LAB — URINALYSIS, ROUTINE W REFLEX MICROSCOPIC
Bilirubin Urine: NEGATIVE
Glucose, UA: NEGATIVE mg/dL
Hgb urine dipstick: NEGATIVE
Ketones, ur: NEGATIVE mg/dL
Nitrite: NEGATIVE
Protein, ur: NEGATIVE mg/dL
Specific Gravity, Urine: 1.025 (ref 1.005–1.030)
Urobilinogen, UA: 0.2 mg/dL (ref 0.0–1.0)
pH: 6 (ref 5.0–8.0)

## 2011-08-21 LAB — COMPREHENSIVE METABOLIC PANEL
ALT: 17 U/L (ref 0–35)
AST: 17 U/L (ref 0–37)
Albumin: 4.4 g/dL (ref 3.5–5.2)
Alkaline Phosphatase: 79 U/L (ref 39–117)
BUN: 18 mg/dL (ref 6–23)
CO2: 29 mEq/L (ref 19–32)
Calcium: 9.2 mg/dL (ref 8.4–10.5)
Chloride: 102 mEq/L (ref 96–112)
Creatinine, Ser: 1.03 mg/dL (ref 0.4–1.2)
GFR calc Af Amer: >60 mL/min (ref >60)
GFR calc non Af Amer: 54 mL/min — ABNORMAL LOW (ref >60)
Glucose, Bld: 103 mg/dL — ABNORMAL HIGH (ref 70–99)
Potassium: 3.7 mEq/L (ref 3.5–5.1)
Sodium: 137 mEq/L (ref 135–145)
Total Bilirubin: 0.6 mg/dL (ref 0.3–1.2)
Total Protein: 6.8 g/dL (ref 6.0–8.3)

## 2011-08-21 LAB — URINE MICROSCOPIC-ADD ON

## 2011-08-21 LAB — DIFFERENTIAL
Basophils Absolute: 0 10*3/uL (ref 0.0–0.1)
Lymphocytes Relative: 39 % (ref 12–46)
Monocytes Absolute: 0.5 10*3/uL (ref 0.1–1.0)
Monocytes Relative: 6 % (ref 3–12)
Neutro Abs: 4 10*3/uL (ref 1.7–7.7)

## 2011-08-21 LAB — CBC
HCT: 37.9 % (ref 36.0–46.0)
MCHC: 33.3 g/dL (ref 30.0–36.0)
MCV: 85.1 fL (ref 78.0–100.0)
Platelets: 244 10*3/uL (ref 150–400)
RDW: 14.1 % (ref 11.5–15.5)

## 2011-08-21 LAB — APTT: aPTT: 26 seconds (ref 24–37)

## 2011-08-21 LAB — PROTIME-INR: INR: 0.9 (ref 0.00–1.49)

## 2012-01-11 ENCOUNTER — Other Ambulatory Visit: Payer: Self-pay | Admitting: Internal Medicine

## 2012-01-11 DIAGNOSIS — Z1231 Encounter for screening mammogram for malignant neoplasm of breast: Secondary | ICD-10-CM

## 2012-01-21 ENCOUNTER — Ambulatory Visit
Admission: RE | Admit: 2012-01-21 | Discharge: 2012-01-21 | Disposition: A | Discharge status: Home / Self Care | Payer: Medicare Other | Source: Ambulatory Visit | Attending: Internal Medicine | Admitting: Internal Medicine

## 2012-01-21 DIAGNOSIS — Z1231 Encounter for screening mammogram for malignant neoplasm of breast: Secondary | ICD-10-CM

## 2012-09-19 ENCOUNTER — Other Ambulatory Visit (HOSPITAL_COMMUNITY): Payer: Self-pay | Admitting: Internal Medicine

## 2012-09-19 DIAGNOSIS — M545 Low back pain, unspecified: Secondary | ICD-10-CM

## 2012-09-22 ENCOUNTER — Ambulatory Visit (HOSPITAL_COMMUNITY)
Admission: RE | Admit: 2012-09-22 | Discharge: 2012-09-22 | Disposition: A | Discharge status: Home / Self Care | Payer: Medicare Other | Source: Ambulatory Visit | Attending: Internal Medicine | Admitting: Internal Medicine

## 2012-09-22 DIAGNOSIS — M5137 Other intervertebral disc degeneration, lumbosacral region: Secondary | ICD-10-CM | POA: Insufficient documentation

## 2012-09-22 DIAGNOSIS — M47817 Spondylosis without myelopathy or radiculopathy, lumbosacral region: Secondary | ICD-10-CM | POA: Insufficient documentation

## 2012-09-22 DIAGNOSIS — M51379 Other intervertebral disc degeneration, lumbosacral region without mention of lumbar back pain or lower extremity pain: Secondary | ICD-10-CM | POA: Insufficient documentation

## 2012-09-22 DIAGNOSIS — M545 Low back pain, unspecified: Secondary | ICD-10-CM

## 2012-09-22 LAB — POCT I-STAT, CHEM 8
BUN: 20 mg/dL (ref 6–23)
Calcium, Ion: 1.19 mmol/L (ref 1.13–1.30)
Chloride: 107 mEq/L (ref 96–112)
Creatinine, Ser: 1.1 mg/dL (ref 0.50–1.10)
Glucose, Bld: 108 mg/dL — ABNORMAL HIGH (ref 70–99)
HCT: 37 % (ref 36.0–46.0)

## 2012-09-22 MED ORDER — GADOBENATE DIMEGLUMINE 529 MG/ML IV SOLN
18.0000 mL | Freq: Once | INTRAVENOUS | Status: AC | PRN
  Administered 2012-09-22: 18 mL via INTRAVENOUS

## 2012-09-22 NOTE — Progress Notes (Signed)
Blood sample obtained from left arm IV for Creatnine level.  

## 2012-10-27 ENCOUNTER — Ambulatory Visit (HOSPITAL_COMMUNITY)
Admission: RE | Admit: 2012-10-27 | Discharge: 2012-10-27 | Disposition: A | Discharge status: Home / Self Care | Payer: Medicare Other | Source: Ambulatory Visit | Attending: Neurosurgery | Admitting: Neurosurgery

## 2012-10-27 DIAGNOSIS — R262 Difficulty in walking, not elsewhere classified: Secondary | ICD-10-CM | POA: Insufficient documentation

## 2012-10-27 DIAGNOSIS — M549 Dorsalgia, unspecified: Secondary | ICD-10-CM | POA: Insufficient documentation

## 2012-10-27 DIAGNOSIS — R29898 Other symptoms and signs involving the musculoskeletal system: Secondary | ICD-10-CM | POA: Insufficient documentation

## 2012-10-27 DIAGNOSIS — IMO0001 Reserved for inherently not codable concepts without codable children: Secondary | ICD-10-CM | POA: Insufficient documentation

## 2012-10-27 DIAGNOSIS — M2560 Stiffness of unspecified joint, not elsewhere classified: Secondary | ICD-10-CM | POA: Insufficient documentation

## 2012-10-27 NOTE — Evaluation (Signed)
Physical Therapy Evaluation  Patient Details  Name: [name] MRN: [id number] Date of Birth: Nov 15, 1946  Today's Date: 10/27/2012 Time: 0805-0848 PT Time Calculation (min): 43 min  Visit#: 1  of 8   Re-eval: 11/26/12 Assessment Diagnosis: LBP Next MD Visit: 11/22/11 Prior Therapy: none  Authorization: [employer] medicare complete   Past Medical History: No past medical history on file. Past Surgical History: No past surgical history on file.  Subjective Symptoms/Limitations Symptoms: [name] states that she has been having back pain for the past three months.  The patient had back surgery four yrs ago but did well after that.  She states she has pain on both sides of her back and both of her legs have radicular pain.  She is unable to lie in the bed any longer due to the pain.  She is now staying in a recliner most of the time.  Pertinent History: disectomy L4-5 four years ago.   How long can you sit comfortably?: 5-10 minutes. Pt is unable to travel in a car greater than 30 minutes. (30 minutes she is in severe pain). How long can you stand comfortably?: unable to stand more than two minutes. How long can you walk comfortably?: no more than five minutes. Special Tests: Pt is not sleeping. Pain Assessment Currently in Pain?: Yes Pain Score:   6 (worst 8/10; best 3-4/10) Pain Location: Back Pain Orientation: Right;Left Pain Type: Chronic pain   Assessment RLE Strength Right Hip Flexion: 5/5 Right Hip Extension: 3+/5 Right Hip ABduction: 5/5 Right Hip ADduction: 5/5 Right Knee Flexion: 5/5 Right Knee Extension: 4/5 Right Ankle Dorsiflexion: 5/5 LLE Strength Left Hip Flexion: 3/5 Left Hip Extension: 3+/5 Left Hip ABduction: 5/5 Left Hip ADduction: 4/5 Left Knee Flexion: 4/5 Left Knee Extension: 5/5 Left Ankle Dorsiflexion: 5/5 Lumbar AROM Lumbar Flexion: wnl Lumbar Extension: decreased 50% Lumbar - Right Side Bend: decreased 50% Lumbar - Left Side Bend: decreased  25% Lumbar - Right Rotation: decreased 20% Lumbar - Left Rotation: decreased 30%  Exercise/Treatments    Stretches Single Knee to Chest Stretch: 3 reps;30 seconds Standing Side Bend: 5 reps Standing Extension: 5 reps   Supine Ab Set: 10 reps Other Supine Lumbar Exercises: Kegal x 10  Physical Therapy Assessment and Plan PT Assessment and Plan Clinical Impression Statement: Pt with postural changes and weakened core mm affecting low back pain.  Pt will benefit from skilled physical therapy to improve pt function and quality of life. Pt will benefit from skilled therapeutic intervention in order to improve on the following deficits: Decreased activity tolerance;Decreased range of motion;Decreased strength;Difficulty walking;Impaired flexibility;Pain Rehab Potential: Good PT Frequency: Min 2X/week PT Duration: 4 weeks PT Treatment/Interventions: Therapeutic exercise;Therapeutic activities PT Plan: begin bent knee raise, bridge, clam, and decompressive exercises next treatment.  Pt will need decompressive exercises for home.  Second treatment begin prone heel squeeze, and SLR; third treatment add T-band exercises for posture.    Goals Home Exercise Program Pt will Perform Home Exercise Program: Independently PT Short Term Goals Time to Complete Short Term Goals: 2 weeks PT Short Term Goal 1: no radicular pain past the knee level PT Short Term Goal 1 - Progress: Met PT Short Term Goal 2: Pt to be able to sit for 30 minutes without difficutly PT Short Term Goal 4: Pt to be able to stand x 15 minutes PT Short Term Goal 5: Pt to be able to walk 15 minutes PT Long Term Goals Time to Complete Long Term Goals: 4  weeks PT Long Term Goal 1: No radicular pain in the past week PT Long Term Goal 2: Pt to be able to walk for 30 minutes  Long Term Goal 3: Pt to be I in advance HEP Long Term Goal 4: Pain level to be no greater than a 2 80 % of the day PT Long Term Goal 5: Pt to stand upright, (  currently forward bent 20 degrees)  Problem List Patient Active Problem List  Diagnosis  . Stiffness of joints, not elsewhere classified, multiple sites  . Weakness of both legs  . Difficulty in walking    PT - End of Session Activity Tolerance: Patient tolerated treatment well General Behavior During Session: [facility] for tasks performed Cognition: [facility] for tasks performed PT Plan of Care PT Home Exercise Plan: given  GP Functional Assessment Tool Used: Oswestry Functional Limitation: Mobility: Walking and moving around Mobility: Walking and Moving Around Current Status ([id number]): At least 60 percent but less than 80 percent impaired, limited or restricted Mobility: Walking and Moving Around Goal Status [phone]): At least 20 percent but less than 40 percent impaired, limited or restricted  [name],CINDY 10/27/2012, 12:33 PM  Physician Documentation Your signature is required to indicate approval of the treatment plan as stated above.  Please sign and either send electronically or make a copy of this report for your files and return this physician signed original.   Please mark one 1.__approve of plan  2. ___approve of plan with the following conditions.   ______________________________                                                          _____________________ Physician Signature                                                                                                             Date

## 2012-11-01 ENCOUNTER — Ambulatory Visit (HOSPITAL_COMMUNITY)
Admission: RE | Admit: 2012-11-01 | Discharge: 2012-11-01 | Disposition: A | Discharge status: Home / Self Care | Payer: Medicare Other | Source: Ambulatory Visit | Attending: Neurosurgery | Admitting: Neurosurgery

## 2012-11-01 NOTE — Progress Notes (Signed)
Physical Therapy Treatment Patient Details  Name: [name] MRN: [id number] Date of Birth: 1946-01-21  Today's Date: 11/01/2012 Time: 1324-4010 PT Time Calculation (min): 40 min  Visit#: 2  of 8   Re-eval: 11/26/12 Charges: Therex x 38'  Authorization: [employer] medicare complete    Subjective: Symptoms/Limitations Symptoms: Pt states that she was doing well until Sunday. She states that she was shopping and both legs locked up. Pain Assessment Currently in Pain?: Yes Pain Score:   4 Pain Location: Back Pain Orientation: Right;Left Pain Radiating Towards: B ankles   Exercise/Treatments Stretches Single Knee to Chest Stretch: 3 reps;30 seconds Supine Ab Set: 10 reps Clam: 10 reps Bent Knee Raise: 10 reps Bridge: 10 reps;5 seconds Other Supine Lumbar Exercises: Kegal x 10 Other Supine Lumbar Exercises: Decompression 1-5  Physical Therapy Assessment and Plan PT Assessment and Plan Clinical Impression Statement: Pt presents with decreased mobility in LLE compared to R. Pt completes therex with good core contraction after initial demonstration. Began sciatic nerve glides to decrease adhesions and radicular pain. Pt reports no change in pain at end of session. PT Plan: Next treatment begin prone heel squeeze, and SLR; third treatment add T-band exercises for posture.     Problem List Patient Active Problem List  Diagnosis  . Stiffness of joints, not elsewhere classified, multiple sites  . Weakness of both legs  . Difficulty in walking    PT - End of Session Activity Tolerance: Patient tolerated treatment well General Behavior During Session: [facility] for tasks performed Cognition: [facility] for tasks performed  GP Functional Assessment Tool Used: Oswestry  Seth Bake, PTA 11/01/2012, 11:43 AM

## 2012-11-03 ENCOUNTER — Ambulatory Visit (HOSPITAL_COMMUNITY)
Admission: RE | Admit: 2012-11-03 | Discharge: 2012-11-03 | Disposition: A | Discharge status: Home / Self Care | Payer: Medicare Other | Source: Ambulatory Visit | Attending: Neurosurgery | Admitting: Neurosurgery

## 2012-11-03 NOTE — Progress Notes (Signed)
Physical Therapy Treatment Patient Details  Name: [name] MRN: [id number] Date of Birth: 10-Mar-1946  Today's Date: 11/03/2012 Time: 4098-1191 PT Time Calculation (min): 41 min  Visit#: 3  of 8   Re-eval: 11/26/12 Charges: Therex x 32' Manual x 8'  Authorization: [employer] medicare complete   Subjective: Symptoms/Limitations Symptoms: Pt states that her radicular sx have decreased since last session. Pain Assessment Currently in Pain?: Yes Pain Score:   3 Pain Location: Back Pain Orientation: Right;Left Pain Radiating Towards: B ankles   Exercise/Treatments Stretches Active Hamstring Stretch: 2 reps;30 seconds Single Knee to Chest Stretch: 2 reps;30 seconds Supine Ab Set: 10 reps Clam: 10 reps Bent Knee Raise: 10 reps Bridge: 10 reps;5 seconds Prone  Straight Leg Raise: 10 reps Other Prone Lumbar Exercises: Heel squeeze 10x5"  Manual Therapy Manual Therapy: Other (comment) Other Manual Therapy: Sciatic nerve glides completed to B LE to decrease adhesions and radicular sx  Physical Therapy Assessment and Plan PT Assessment and Plan Clinical Impression Statement: Began scapular tband exercises with multimodal cueing for proper form. Pt completes therex with good core contraction after initial cueing. Completed sciatic nerve glides again this session as they decreased radicular pain after last session. PT Plan: Continue to progress core strength and decrease radicular sx per PT POC.      Problem List Patient Active Problem List  Diagnosis  . Stiffness of joints, not elsewhere classified, multiple sites  . Weakness of both legs  . Difficulty in walking    PT - End of Session Activity Tolerance: Patient tolerated treatment well General Behavior During Session: [facility] for tasks performed Cognition: [facility] for tasks performed  GP Functional Assessment Tool Used: Oswestry  Seth Bake, PTA 11/03/2012, 9:13 AM

## 2012-11-10 ENCOUNTER — Ambulatory Visit (HOSPITAL_COMMUNITY)
Admission: RE | Admit: 2012-11-10 | Discharge: 2012-11-10 | Disposition: A | Discharge status: Home / Self Care | Payer: Medicare Other | Source: Ambulatory Visit | Attending: Neurosurgery | Admitting: Neurosurgery

## 2012-11-10 NOTE — Progress Notes (Signed)
Physical Therapy Treatment Patient Details  Name: [name] MRN: [id number] Date of Birth: [birth year]  Today's Date: 11/10/2012 Time: 4098-1191 PT Time Calculation (min): 39 min  Visit#: 4  of 8   Re-eval: 11/26/12 Authorization: Susa Simmonds medicare complete  Charges:  therex 38'  Subjective: Symptoms/Limitations Symptoms: Pt. states she is not having any radicular symptoms today, pt. is centralized iin LB bilaterally. Pain Assessment Currently in Pain?: Yes Pain Score:   2 Pain Location: Back Pain Orientation: Mid   Exercise/Treatments Stretches Active Hamstring Stretch: 2 reps;30 seconds Single Knee to Chest Stretch: 2 reps;30 seconds Standing Other Standing Lumbar Exercises: postural 3 theraband added 12/19 visit but not done today due to time. Supine Ab Set: 15 reps Clam: 15 reps Bent Knee Raise: 15 reps Bridge: 15 reps Straight Leg Raise: 10 reps Other Supine Lumbar Exercises: hip adduction iso 10X5" Prone  Straight Leg Raise: 10 reps Other Prone Lumbar Exercises: Heel squeeze 10x5"     Physical Therapy Assessment and Plan PT Assessment and Plan Clinical Impression Statement: Unable to complete scapular tband exercises today due to time constraints (pt. requested to be done early due to meeting).  Pt. did not need nerve glides this session due to no radicular symptoms.  Pt. with overall improving form and decreasing symptoms.  PT Plan: Continue to progress core strength and decrease radicular sx per PT POC.  Resume postural tband exercises and add nerve glides as needed for radicular symtoms.      Problem List Patient Active Problem List  Diagnosis  . Stiffness of joints, not elsewhere classified, multiple sites  . Weakness of both legs  . Difficulty in walking    PT - End of Session Activity Tolerance: Patient tolerated treatment well General Behavior During Session: [facility] for tasks performed Cognition: [facility] for tasks performed   Lurena Nida,  PTA/CLT 11/10/2012, 12:01 PM

## 2012-11-15 ENCOUNTER — Ambulatory Visit (HOSPITAL_COMMUNITY)
Admission: RE | Admit: 2012-11-15 | Discharge: 2012-11-15 | Disposition: A | Discharge status: Home / Self Care | Payer: Medicare Other | Source: Ambulatory Visit | Attending: *Deleted | Admitting: *Deleted

## 2012-11-15 NOTE — Progress Notes (Signed)
Physical Therapy Treatment Patient Details  Name: LASEAN RAHMING MRN: [id number] Date of Birth: [birth year]  Today's Date: 11/15/2012 Time: 0802-0845 PT Time Calculation (min): 43 min  Visit#: 5  of 8   Re-eval: 11/26/12 Charges: Therex x 30' Manual x 10'  Authorization: [employer] medicare complete  Authorization Visit#: 5  of 10    Subjective: Symptoms/Limitations Symptoms: Pt describes pain as soreness. Pt reports decreased radicular sx. Pain Assessment Currently in Pain?: Yes Pain Score:   3 Pain Location: Back Pain Orientation: Lower Pain Radiating Towards: B ankles   Exercise/Treatments Supine Ab Set: 15 reps Bent Knee Raise: 15 reps Bridge: 15 reps Straight Leg Raise: 10 reps Other Supine Lumbar Exercises: hip adduction iso 10X5" Sidelying Clam: 5 reps;Limitations Clam Limitations: 10" holds Prone  Straight Leg Raise: 10 reps  Manual Therapy Manual Therapy: Other (comment) Other Manual Therapy: Sciatic nerve glides completed to B LE to decrease adhesions and radicular sx  Physical Therapy Assessment and Plan PT Assessment and Plan Clinical Impression Statement: Pt completes therex withi mproved form and decreased need for cuein.g Pt displays improved glute strength with prone exercises. Pt continues to require vc's to avoid lumbar contraction with prone hip extension.  PT Plan: Continue to progress core strength and decrease radicular sx per PT POC.  Resume postural tband exercises and add nerve glides as needed for radicular symtoms.      Problem List Patient Active Problem List  Diagnosis  . Stiffness of joints, not elsewhere classified, multiple sites  . Weakness of both legs  . Difficulty in walking    PT - End of Session Activity Tolerance: Patient tolerated treatment well General Behavior During Session: [facility] for tasks performed Cognition: [facility] for tasks performed  GP Functional Assessment Tool Used: Oswestry  Seth Bake, PTA 11/15/2012, 9:09  AM

## 2012-11-17 ENCOUNTER — Ambulatory Visit (HOSPITAL_COMMUNITY): Payer: Medicare Other | Admitting: Physical Therapy

## 2012-11-22 ENCOUNTER — Ambulatory Visit (HOSPITAL_COMMUNITY)
Admission: RE | Admit: 2012-11-22 | Discharge: 2012-11-22 | Disposition: A | Discharge status: Home / Self Care | Payer: Medicare Other | Source: Ambulatory Visit | Attending: Neurosurgery | Admitting: Neurosurgery

## 2012-11-22 DIAGNOSIS — M549 Dorsalgia, unspecified: Secondary | ICD-10-CM | POA: Insufficient documentation

## 2012-11-22 DIAGNOSIS — IMO0001 Reserved for inherently not codable concepts without codable children: Secondary | ICD-10-CM | POA: Insufficient documentation

## 2012-11-22 DIAGNOSIS — R262 Difficulty in walking, not elsewhere classified: Secondary | ICD-10-CM | POA: Insufficient documentation

## 2012-11-22 DIAGNOSIS — M2560 Stiffness of unspecified joint, not elsewhere classified: Secondary | ICD-10-CM | POA: Insufficient documentation

## 2012-11-22 NOTE — Progress Notes (Signed)
Physical Therapy Treatment Patient Details  Name: VIEVA BRUMMITT MRN: [id number] Date of Birth: [birth year]  Today's Date: 11/22/2012 Time: 0830-0925 PT Time Calculation (min): 55 min  Visit#: 6  of 8   Re-eval: 11/26/12 Charges: Therex x 38' MHP x 15'  Authorization: [employer] medicare complete  Authorization Time Period:    Authorization Visit#: 6  of 10    Subjective: Symptoms/Limitations Symptoms: Pt states that she is having more stiffness and soreness than pain. Pain Assessment Currently in Pain?: Yes Pain Score:   3 Pain Location: Back Pain Orientation: Lower   Exercise/Treatments Stretches ITB Stretch: 2 reps;30 seconds;Limitations ITB Stretch Limitations: with rope Piriformis Stretch: 2 reps;30 seconds Standing Scapular Retraction: 15 reps;Theraband Theraband Level (Scapular Retraction): Level 3 (Green) Row: 15 reps;Theraband Theraband Level (Row): Level 3 (Green) Shoulder Extension: 15 reps;Theraband Theraband Level (Shoulder Extension): Level 3 (Green) Supine Bent Knee Raise: 15 reps Bridge: 15 reps Straight Leg Raise: 10 reps Sidelying Clam: 5 reps;Limitations Clam Limitations: 10" holds Prone  Straight Leg Raise: 10 reps Other Prone Lumbar Exercises: Heel squeeze 10x5"  Physical Therapy Assessment and Plan PT Assessment and Plan Clinical Impression Statement: Pt completes therex well with minimal need for cueing. TIghtness in B piriformis and ITB noted. Began stretches for both. MHP applied to lumbar at end of session  to decrease pain and  tightness. Pt reports pain decrease to 1/10 at end of session. PT Plan: Continue to progress core strength and decrease radicular sx per PT POC.      Goals    Problem List Patient Active Problem List  Diagnosis  . Stiffness of joints, not elsewhere classified, multiple sites  . Weakness of both legs  . Difficulty in walking    PT - End of Session Activity Tolerance: Patient tolerated treatment  well General Behavior During Session: [facility] for tasks performed Cognition: [facility] for tasks performed  GP Functional Assessment Tool Used: Oswestry  Seth Bake, PTA 11/22/2012, 9:32 AM

## 2012-11-24 ENCOUNTER — Ambulatory Visit (HOSPITAL_COMMUNITY)
Admission: RE | Admit: 2012-11-24 | Discharge: 2012-11-24 | Disposition: A | Discharge status: Home / Self Care | Payer: Medicare Other | Source: Ambulatory Visit | Attending: Neurosurgery | Admitting: Neurosurgery

## 2012-11-24 DIAGNOSIS — M2560 Stiffness of unspecified joint, not elsewhere classified: Secondary | ICD-10-CM

## 2012-11-24 DIAGNOSIS — R29898 Other symptoms and signs involving the musculoskeletal system: Secondary | ICD-10-CM

## 2012-11-24 DIAGNOSIS — R262 Difficulty in walking, not elsewhere classified: Secondary | ICD-10-CM

## 2012-11-24 NOTE — Evaluation (Signed)
Physical Therapy Reassessment Patient Details  Name: [name] MRN: [id number] Date of Birth: 12-Mar-1946  Today's Date: 11/24/2012 Time: 0802-0845 PT Time Calculation (min): 43 min  Visit#: 7  of 16   Re-eval: 12/24/12 Assessment Diagnosis: LBP Next MD Visit: 12/24/2012 Prior Therapy: none Charge:  MM test; ROM test, [place] x 8'; Manual x 4' Authorization: [employer] medicare complete  Authorization Visit#: 7  of 10    Past Medical History: No past medical history on file. Past Surgical History: No past surgical history on file.  Subjective Symptoms/Limitations Symptoms: My legs are a lot better they do not ache like they did but my back is still stiff.   How long can you sit comfortably?: Able to sit for an hour or two but traveling in a car is still difficult aftter 30 minutes but she is no longer in severe pain at this time. How long can you stand comfortably?: Able to stand five to ten minutes was less than two minutes. How long can you walk comfortably?: Pt does not go out and walk.  She states after work it is too late.   Special Tests: Pt is waking up three times a night. Patient Stated Goals: Pt states she is still having significant difficulty going up and down steps due to not having the strength to pull herself up.  She needs to hold on going down the steps. 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 Leisure: Hobbies-yes (Comment)  Cognition/Observation Cognition Overall Cognitive Status: Appears within functional limits for tasks assessed  Sensation/Coordination/Flexibility/Functional Tests Flexibility Thomas: Positive 90/90: Positive Functional Tests Functional Tests: [id number]  Assessment RLE Strength Right Hip Flexion: 5/5 Right Hip Extension: 4/5 (was 3+) Right Hip  ABduction: 5/5 Right Hip ADduction: 5/5 Right Knee Flexion: 5/5 Right Knee Extension: 5/5 (was 4/5) Right Ankle Dorsiflexion: 5/5 LLE Strength Left Hip Flexion: 3/5 Left Hip Extension: 4/5 (was 3+/5) Left Hip ABduction: 5/5 Left Hip ADduction: 4/5 Left Knee Flexion: 4/5 Left Knee Extension: 5/5 Left Ankle Dorsiflexion: 5/5 Lumbar AROM Lumbar Flexion: wnl Lumbar Extension: decreased 20% was  50% Lumbar - Right Side Bend: decreased 30% was 50% Lumbar - Left Side Bend: decreased 20 % was 25% Lumbar - Right Rotation: decreased 20% was 20% Lumbar - Left Rotation: decreased 30% was 30%  Exercise/Treatments    Stretches Standing Side Bend: 5 reps Standing Extension: 5 reps Prone  Straight Leg Raise: 10 reps    Modalities Modalities: Ultrasound Manual Therapy Manual Therapy: Other (comment) Other Manual Therapy: myofascial release and STM to decrease adhesions to lumbar spine at surgical site Ultrasound Ultrasound Location: B paraspinal mm Ultrasound Parameters: 1.5 w/cm2 Ultrasound Goals: Pain  Physical Therapy Assessment and Plan PT Assessment and Plan Clinical Impression Statement: Pt has progressed well with therapy but still has weakned core and low back pain which is affecting functional mobility.  Pt will  benefit from continued theapy to begin stabilization with functional tasks as well and decrease adhesions in LB to decrease pain and improve ROM Pt will benefit from skilled therapeutic intervention in order to improve on the following deficits: Decreased balance;Decreased activity tolerance;Decreased range of motion;Decreased strength;Difficulty walking;Increased fascial restricitons;Increased muscle spasms;Impaired flexibility;Pain Rehab Potential: Good PT Frequency: Min 2X/week PT Duration: 4 weeks PT Treatment/Interventions: Therapeutic activities;Functional mobility training;Therapeutic exercise;Balance training;Manual techniques;Modalities;Patient/family  education PT Plan: Begin [place] and  manual myfascial and STM; continue on stab as well as standing functional activity w/stab,( step up to progress to stair climbing; sit to stand/= begin wall push ups and opposite arm/leg next treatment.    Goals Home Exercise Program PT Goal: Perform Home Exercise Program - Progress: Progressing toward goal PT Short Term Goals PT Short Term Goal 1 - Progress: Met PT Short Term Goal 2 - Progress: Met PT Short Term Goal 4 - Progress: Progressing toward goal PT Short Term Goal 5 - Progress: Met PT Long Term Goals PT Long Term Goal 1 - Progress: Progressing toward goal PT Long Term Goal 2 - Progress: Progressing toward goal Long Term Goal 4 Progress: Progressing toward goal Long Term Goal 5 Progress: Not met  Problem List Patient Active Problem List  Diagnosis  . Stiffness of joints, not elsewhere classified, multiple sites  . Weakness of both legs  . Difficulty in walking    PT - End of Session Activity Tolerance: Patient tolerated treatment well General Behavior During Session: [facility] for tasks performed Cognition: [facility] for tasks performed PT Plan of Care PT Home Exercise Plan: given  GP    Artur Winningham,CINDY 11/24/2012, 9:46 AM  Physician Documentation Your signature is required to indicate approval of the treatment plan as stated above.  Please sign and either send electronically or make a copy of this report for your files and return this physician signed original.   Please mark one 1.__approve of plan  2. ___approve of plan with the following conditions.   ______________________________                                                          _____________________ Physician Signature                                                                                                             Date

## 2012-11-29 ENCOUNTER — Ambulatory Visit (HOSPITAL_COMMUNITY)
Admission: RE | Admit: 2012-11-29 | Discharge: 2012-11-29 | Disposition: A | Discharge status: Home / Self Care | Payer: Medicare Other | Source: Ambulatory Visit | Attending: Neurosurgery | Admitting: Neurosurgery

## 2012-11-29 NOTE — Progress Notes (Signed)
Physical Therapy Treatment Patient Details  Name: EVALYNNE LOCURTO MRN: [id number] Date of Birth: [birth year]  Today's Date: 11/29/2012 Time: 4098-1191 PT Time Calculation (min): 38 min  Visit#: 8  of 16   Re-eval: 12/24/12 Charges:  therex  20' , [place] 8', manual 8'  Authorization: [employer] medicare complete  Authorization Time Period:    Authorization Visit#: 8  of 10    Subjective: Symptoms/Limitations Symptoms: Pt. reports more pain on her L side.  currently 3/10 pain but can tell she is improving since beginning therapy.   Exercise/Treatments Standing Other Standing Lumbar Exercises: fwd, lateral step ups, fwd step downs all with 4" step, 1 HHA, 10 reps  Other Standing Lumbar Exercises: wall push ups 10 reps, UE flexion against wall (add next visit) Seated Other Seated Lumbar Exercises: sit to stand 5 reps no UE's Prone  Opposite Arm/Leg Raise: 5 reps   Modalities Modalities: Ultrasound Manual Therapy Manual Therapy: Myofascial release Myofascial Release: to Bilateral lumbar paraspinal mm X 8' in prone position Ultrasound Ultrasound Location: B paraspinal mm in prone position Ultrasound Parameters: 1.4 w/cm2 continuous with Ultrasound Goals: Pain  Physical Therapy Assessment and Plan PT Assessment and Plan Clinical Impression Statement: Progressed per PT POC.  Added step ups with 4" step.  Noted weakness in R LE greater than L, however with more discomfort/pain in Left.  Added stab exercises with multimodal cues but without c/o pain. PT Plan: G-codes in 2 more visits.  Continue to progress stab with functional standing activities progressing to stair negotiation goal.  Add UE stab against wall next visit.     Problem List Patient Active Problem List  Diagnosis  . Stiffness of joints, not elsewhere classified, multiple sites  . Weakness of both legs  . Difficulty in walking    PT - End of Session Activity Tolerance: Patient tolerated treatment  well General Behavior During Session: [facility] for tasks performed Cognition: [facility] for tasks performed   Lurena Nida, PTA/CLT 11/29/2012, 9:11 AM

## 2012-12-01 ENCOUNTER — Ambulatory Visit (HOSPITAL_COMMUNITY)
Admission: RE | Admit: 2012-12-01 | Discharge: 2012-12-01 | Disposition: A | Discharge status: Home / Self Care | Payer: Medicare Other | Source: Ambulatory Visit | Attending: Neurosurgery | Admitting: Neurosurgery

## 2012-12-01 NOTE — Progress Notes (Signed)
Physical Therapy Treatment Patient Details  Name: [name] MRN: [id number] Date of Birth: [birth year]  Today's Date: 12/01/2012 Time: 8295-6213 PT Time Calculation (min): 40 min  Visit#: 9  of 16   Re-eval: 12/24/12 Charges: Therex x 22' Manual x 8' [place] x 8'  Authorization: [employer] medicare complete  Authorization Visit#: 9  of 10    Subjective: Symptoms/Limitations Symptoms: Pt states that her back pain has decreased and she is no longer having radicular pain. She states that she is having more stiffness than pain in her lower back. Pain Assessment Currently in Pain?: Yes Pain Score:   3 Pain Location: Back Pain Orientation: Lower   Exercise/Treatments Standing Other Standing Lumbar Exercises: fwd, lateral step ups, fwd step downs all with 4" step, 1 HHA, 10 reps  Other Standing Lumbar Exercises: wall push ups 10 reps, UE flexion against wall x10 Seated Other Seated Lumbar Exercises: sit to stand 10 reps no UE's from standard surface Prone  Single Arm Raise: 10 reps Straight Leg Raise: 10 reps Opposite Arm/Leg Raise: 5 reps  Modalities Modalities: Ultrasound Manual Therapy Manual Therapy: Myofascial release Other Manual Therapy: myofascial release and STM to decrease adhesions to lumbar spine at surgical site  Ultrasound Ultrasound Location: B paraspinal mm in prone position  Ultrasound Parameters: 1.2 w/cm2 continuous with  Ultrasound Goals: Pain  Physical Therapy Assessment and Plan PT Assessment and Plan Clinical Impression Statement: Pt appears to be progressing well. Radicular sx have subsided and back pain is decreasing. Pt Continues to display weakness in B LE. [place] and manual techniques completed to decrease pain and stiffness. Pt reports pain decrease to 2/10 at end of session. PT Plan: Continue to progress stab with functional standing activities progressing to stair negotiation goal. Update g-code next session.     Problem List Patient Active Problem  List  Diagnosis  . Stiffness of joints, not elsewhere classified, multiple sites  . Weakness of both legs  . Difficulty in walking    PT - End of Session Activity Tolerance: Patient tolerated treatment well General Behavior During Session: [facility] for tasks performed Cognition: [facility] for tasks performed   Seth Bake, PTA 12/01/2012, 8:59 AM

## 2012-12-06 ENCOUNTER — Ambulatory Visit (HOSPITAL_COMMUNITY)
Admission: RE | Admit: 2012-12-06 | Discharge: 2012-12-06 | Disposition: A | Discharge status: Home / Self Care | Payer: Medicare Other | Source: Ambulatory Visit | Attending: Neurosurgery | Admitting: Neurosurgery

## 2012-12-06 DIAGNOSIS — M2560 Stiffness of unspecified joint, not elsewhere classified: Secondary | ICD-10-CM

## 2012-12-06 DIAGNOSIS — R29898 Other symptoms and signs involving the musculoskeletal system: Secondary | ICD-10-CM

## 2012-12-06 DIAGNOSIS — R262 Difficulty in walking, not elsewhere classified: Secondary | ICD-10-CM

## 2012-12-06 NOTE — Progress Notes (Signed)
Physical Therapy Treatment Patient Details  Name: [name] MRN: [id number] Date of Birth: Feb 21, 1946 Charge: Therex 12; manual 14'; [place] 8' Today's Date: 12/06/2012 Time: 0803-0848 PT Time Calculation (min): 45 min  Visit#: 10  of 16   Re-eval: 12/23/12 Assessment Diagnosis: LBP Next MD Visit: 12/24/2012 Prior Therapy: none  Authorization: [employer] medicare complete  Authorization Visit#: 10  of 16    Subjective: Symptoms/Limitations Symptoms: Pt states that she was doing well but was in the ER with her mother for four hours last night and now has increased leg pain.  Despite leg pain patient states that she has done so well with therapy she is no longer thinking of having surgery; she wants to see what relief she can get with therapy.  Exercise/Treatments    Stretches Standing Side Bend: 5 reps Standing Extension: 5 reps Prone on Elbows Stretch: 1 rep;60 seconds Press Ups: Limitations Press Ups Limitations: mad cat/old horse x 5   Modalities Modalities: Ultrasound Manual Therapy Manual Therapy: Myofascial release Myofascial Release: myofascial release and STM to decreased adhesions and mm tightness at surgical site. Ultrasound Ultrasound Location: B lumbar paraspinal mm Ultrasound Parameters: 1.5 w/cm2  @ 1 MHz x 8'  Physical Therapy Assessment and Plan PT Assessment and Plan Clinical Impression Statement: Pt w/ acute exacerbation secondary to having to be sitting in the ER for four hours with her mother last night.  PT states she is having leg pain again today.  Treatment focused on decreasing pain and spasms today will resume strengthening exercises next treatment. PT Plan: resume strengthening and posture exercises next treatment.    Goals Home Exercise Program PT Goal: Perform Home Exercise Program - Progress: Met PT Short Term Goals PT Short Term Goal 1 - Progress: Met PT Short Term Goal 4 - Progress: Progressing toward goal PT Short Term Goal 5 - Progress:  Progressing toward goal PT Long Term Goals PT Long Term Goal 1 - Progress: Progressing toward goal PT Long Term Goal 2 - Progress: Progressing toward goal Long Term Goal 3 Progress: Met Long Term Goal 4 Progress: Not met Long Term Goal 5 Progress: Progressing toward goal  Problem List Patient Active Problem List  Diagnosis  . Stiffness of joints, not elsewhere classified, multiple sites  . Weakness of both legs  . Difficulty in walking    PT - End of Session Activity Tolerance: Patient tolerated treatment well  GP Functional Assessment Tool Used: oswestry Functional Limitation: Mobility: Walking and moving around Mobility: Walking and Moving Around Current Status ([id number]): At least 40 percent but less than 60 percent impaired, limited or restricted Mobility: Walking and Moving Around Goal Status [phone]): At least 20 percent but less than 40 percent impaired, limited or restricted  [name],[name] 12/06/2012, 1:24 PM

## 2012-12-08 ENCOUNTER — Ambulatory Visit (HOSPITAL_COMMUNITY): Payer: Medicare Other | Admitting: Physical Therapy

## 2012-12-13 ENCOUNTER — Ambulatory Visit (HOSPITAL_COMMUNITY): Payer: Medicare Other | Admitting: *Deleted

## 2012-12-15 ENCOUNTER — Ambulatory Visit (HOSPITAL_COMMUNITY): Payer: Medicare Other | Admitting: *Deleted

## 2012-12-20 ENCOUNTER — Ambulatory Visit (HOSPITAL_COMMUNITY)
Admission: RE | Admit: 2012-12-20 | Discharge: 2012-12-20 | Disposition: A | Discharge status: Home / Self Care | Payer: Medicare Other | Source: Ambulatory Visit | Attending: Neurosurgery | Admitting: Neurosurgery

## 2012-12-20 DIAGNOSIS — M2560 Stiffness of unspecified joint, not elsewhere classified: Secondary | ICD-10-CM | POA: Insufficient documentation

## 2012-12-20 DIAGNOSIS — M549 Dorsalgia, unspecified: Secondary | ICD-10-CM | POA: Insufficient documentation

## 2012-12-20 DIAGNOSIS — R262 Difficulty in walking, not elsewhere classified: Secondary | ICD-10-CM | POA: Insufficient documentation

## 2012-12-20 DIAGNOSIS — IMO0001 Reserved for inherently not codable concepts without codable children: Secondary | ICD-10-CM | POA: Insufficient documentation

## 2012-12-20 NOTE — Progress Notes (Signed)
Physical Therapy Treatment Patient Details  Name: DEADRA DIGGINS MRN: [id number] Date of Birth: January 12, 1946  Today's Date: 12/20/2012 Time: 0810-0848 PT Time Calculation (min): 38 min  Visit#: 11  of 16   Re-eval: 12/23/12 Charges: Therex x 30' [place] x 8'  Authorization: [employer] medicare complete  Authorization Visit#: 11  of 16    Subjective: Symptoms/Limitations Symptoms: Radicular sx have subsided and pt is only having pain in her back. Pain Assessment Currently in Pain?: Yes Pain Score:   4 Pain Location: Back Pain Orientation: Lower   Exercise/Treatments Standing Other Standing Lumbar Exercises: fwd, lateral step ups, fwd step downs all with 4" step, 1 HHA, 15 reps B Other Standing Lumbar Exercises: wall push ups 10 reps, UE flexion against wall x10 Seated Other Seated Lumbar Exercises: sit to stand 10 reps no UE's from standard surface  Modalities Modalities: Ultrasound Ultrasound Ultrasound Location: B lumbar paraspinal mm  Ultrasound Parameters: 1.5 w/cm2 @ 1 MHz x 8'   Physical Therapy Assessment and Plan PT Assessment and Plan Clinical Impression Statement: Able to resume strengthening and postural exercises without difficulty. Pt completes therex with minimal need for cueing. [place] completed to lumbar to decrease pain and tightness. Pt reports pain decrease to 2/10 at end of session. PT Plan: Reassess next session.     Problem List Patient Active Problem List  Diagnosis  . Stiffness of joints, not elsewhere classified, multiple sites  . Weakness of both legs  . Difficulty in walking    PT - End of Session Activity Tolerance: Patient tolerated treatment well General Behavior During Session: [facility] for tasks performed Cognition: [facility] for tasks performed  GP Functional Assessment Tool Used: Bertis Ruddy, PTA 12/20/2012, 9:00 AM

## 2012-12-22 ENCOUNTER — Ambulatory Visit (HOSPITAL_COMMUNITY)
Admission: RE | Admit: 2012-12-22 | Discharge: 2012-12-22 | Disposition: A | Discharge status: Home / Self Care | Payer: Medicare Other | Source: Ambulatory Visit | Attending: Neurosurgery | Admitting: Neurosurgery

## 2012-12-22 DIAGNOSIS — M2560 Stiffness of unspecified joint, not elsewhere classified: Secondary | ICD-10-CM

## 2012-12-22 DIAGNOSIS — R29898 Other symptoms and signs involving the musculoskeletal system: Secondary | ICD-10-CM

## 2012-12-22 DIAGNOSIS — R262 Difficulty in walking, not elsewhere classified: Secondary | ICD-10-CM

## 2012-12-22 NOTE — Evaluation (Addendum)
Physical Therapy Reassessment  Patient Details  Name: [name] MRN: [id number] Charge:  MM test; self care Today's Date: 12/22/2012 Time: 0800-0839 PT Time Calculation (min): 39 min  Visit#: 12  of 12   Re-eval: 12/22/12 Assessment Diagnosis: LBP Next MD Visit: 12/24/2012 Prior Therapy: none  Authorization: [employer] medicare   Past Medical History: No past medical history on file. Past Surgical History: No past surgical history on file.  Subjective Symptoms/Limitations Symptoms: Pt is doing well.  She states her legs no longer has pain.   How long can you stand comfortably?: Able to stand for 15-20 minutes.  Was 10 min.  in Jan.   2 min in Dec. Special Tests: Pt is waking up 2x a night now.  was 3.  Sometimes waking is due to needing to use the restroom Patient Stated Goals: Steps are a litttle better but still is having difficulty Pain Assessment Pain Score:   4 Pain Location: Back Pain Orientation: Lower Pain Type: Chronic pain   Prior Functioning  Prior Function Vocation: Full time employment Vocation Requirements: sit, stand, walk does altrerations Leisure: Hobbies-yes (Comment)  Cognition/Observation Cognition Overall Cognitive Status: Appears within functional limits for tasks assessed  Sensation/Coordination/Flexibility/Functional Tests Flexibility Thomas: Positive 90/90: Positive Functional Tests Functional Tests: Oswestry 27/50 was 38/50  Assessment LLE Strength Left Hip Flexion: 4/5 (was 3/5) Left Knee Flexion: 4/5 B hip extension 4/5 Lumbar AROM Lumbar Flexion: wnl  Exercise/Treatments  lateral and forward step ups for L LE strength;  Prone hip ext B for glut strength. Physical Therapy Assessment and Plan PT Assessment and Plan Clinical Impression Statement: Pt has no radicular sx and has not since she has been with her mother in the ER.  Pt I in Hep pt goals have been met able to D/C PT Plan: D/C goals all met.  Pt no longer thinking about  surgery as pain is tolerable now.     Goals Home Exercise Program PT Goal: Perform Home Exercise Program - Progress: Met PT Short Term Goals PT Short Term Goal 1 - Progress: Met PT Short Term Goal 2 - Progress: Met PT Short Term Goal 3 - Progress: Met PT Short Term Goal 4 - Progress: Met PT Short Term Goal 5 - Progress: Met PT Long Term Goals PT Long Term Goal 1 - Progress: Met PT Long Term Goal 2 - Progress: Progressing toward goal Long Term Goal 3 Progress: Met Long Term Goal 4 Progress: Not met Long Term Goal 5 Progress: Met  Problem List Patient Active Problem List  Diagnosis  . Stiffness of joints, not elsewhere classified, multiple sites  . Weakness of both legs  . Difficulty in walking    PT - End of Session Activity Tolerance: Patient tolerated treatment well General Behavior During Session: [facility] for tasks performed Cognition: [facility] for tasks performed PT Plan of Care PT Home Exercise Plan: updated exercise given.  Pt and therapist went over the importance of walking everyday for general health as well as the importance of good sitting and standing posture.    GP Functional Assessment Tool Used: clinical judgement Functional Limitation: Mobility: Walking and moving around Mobility: Walking and Moving Around Goal Status [phone]): At least 20 percent but less than 40 percent impaired, limited or restricted Mobility: Walking and Moving Around Discharge Status [phone]): At least 20 percent but less than 40 percent impaired, limited or restricted  Ellory Khurana,CINDY 12/22/2012, 8:43 AM  Physician Documentation Your signature is required to indicate approval of the treatment plan  as stated above.  Please sign and either send electronically or make a copy of this report for your files and return this physician signed original.   Please mark one 1.__approve of plan  2. ___approve of plan with the following conditions.   ______________________________                                                           _____________________ Physician Signature                                                                                                             Date

## 2013-01-26 ENCOUNTER — Other Ambulatory Visit: Payer: Self-pay

## 2013-01-26 DIAGNOSIS — Z1231 Encounter for screening mammogram for malignant neoplasm of breast: Secondary | ICD-10-CM

## 2013-02-24 ENCOUNTER — Ambulatory Visit
Admission: RE | Admit: 2013-02-24 | Discharge: 2013-02-24 | Disposition: A | Discharge status: Home / Self Care | Payer: Medicare Other | Source: Ambulatory Visit

## 2013-02-24 DIAGNOSIS — Z1231 Encounter for screening mammogram for malignant neoplasm of breast: Secondary | ICD-10-CM

## 2013-11-16 HISTORY — PX: BACK SURGERY: SHX140

## 2014-04-05 ENCOUNTER — Other Ambulatory Visit: Payer: Self-pay

## 2014-04-05 DIAGNOSIS — Z1231 Encounter for screening mammogram for malignant neoplasm of breast: Secondary | ICD-10-CM

## 2014-04-16 ENCOUNTER — Ambulatory Visit: Payer: Self-pay | Admitting: Podiatry

## 2014-04-17 ENCOUNTER — Ambulatory Visit: Payer: Self-pay

## 2014-04-17 ENCOUNTER — Ambulatory Visit (INDEPENDENT_AMBULATORY_CARE_PROVIDER_SITE_OTHER): Payer: Medicare Other

## 2014-04-17 VITALS — BP 125/92 | HR 75 | Resp 16 | Ht 64.0 in | Wt 199.0 lb

## 2014-04-17 DIAGNOSIS — B070 Plantar wart: Secondary | ICD-10-CM

## 2014-04-17 DIAGNOSIS — M2010 Hallux valgus (acquired), unspecified foot: Secondary | ICD-10-CM

## 2014-04-17 DIAGNOSIS — M2040 Other hammer toe(s) (acquired), unspecified foot: Secondary | ICD-10-CM

## 2014-04-17 DIAGNOSIS — B351 Tinea unguium: Secondary | ICD-10-CM

## 2014-04-17 DIAGNOSIS — Q828 Other specified congenital malformations of skin: Secondary | ICD-10-CM

## 2014-04-17 DIAGNOSIS — M79609 Pain in unspecified limb: Secondary | ICD-10-CM

## 2014-04-17 MED ORDER — TAVABOROLE 5 % EX SOLN: CUTANEOUS | Status: DC

## 2014-04-17 NOTE — Progress Notes (Signed)
   Subjective:    Patient ID: JAELY SILMAN, female    DOB: Dec 21, 1945, 68 y.o.   MRN: [id number]  HPI Comments: N knot L left 4th MPJ area plantar D 7 - 8 months O C painful, hard core A weight bearing T Dr. Casimer Leek ordered medicated corn pads  Foot Pain Associated symptoms include arthralgias and myalgias.      Review of Systems  Musculoskeletal: Positive for arthralgias, back pain, gait problem and myalgias.  Hematological: Bruises/bleeds easily.  All other systems reviewed and are negative.      Objective:   Physical Exam Lower extremity objective findings as follows neurovascular status is intact pedal pulses palpable DP and PT +2/4 bilateral capillary refill time 3 seconds all digits skin temperature warm turgor normal no edema rubor pallor or varicosities noted. Neurologically epicritic and proprioceptive sensations intact and symmetric bilateral there is normal plantar response and DTRs noted. Dermatologically skin color pigment normal there is keratoses interdigitally fifth digit right foot due to hammertoe deformity which is debrided at this time there is also punctate keratoses sub-third fourth metatarsal of the left foot consistent with verruca plantaris lumicain circular lesion about 6-8 month history noted. Patient also is notable HAV deformity of left foot more so than right as well as digital contractures lesser digits 2 through 5 bilateral. Both hallux nails show some yellowing discoloration and friability and separation of the nail plate from the nailbed consistent with distal subungual onychomycosis.       Assessment & Plan:  Assessments at this time as follows peripheral plantaris lesion is debrided pack to 16% salicylic acid and occlusion x24 hours Aquasol instruction sheet is given we salicylic acid and duct tape under occlusion reappointed one month for followup reevaluation  Assessment #2 at this time is onychomycosis of both hallux nails prescription for  Kerydin [facility] patient will have topical antifungal apply daily for 12 months duration to both the affected hallux nails as instructed.  Assessment #3 hammertoe deformity with keratoses fifth digit right foot keratotic lesion debridement and tube foam padding was applied to the toe maintain accommodative shoes. Return in the future as needed for followup suggest one month followup for verruca evaluation if not improved may consider more aggressive invasive options for treatment next  Harriet Masson DPM

## 2014-04-17 NOTE — Patient Instructions (Signed)
Onychomycosis/Fungal Toenails  WHAT IS IT? An infection that lies within the keratin of your nail plate that is caused by a fungus.  WHY ME? Fungal infections affect all ages, sexes, races, and creeds.  There may be many factors that predispose you to a fungal infection such as age, coexisting medical conditions such as diabetes, or an autoimmune disease; stress, medications, fatigue, genetics, etc.  Bottom line: fungus thrives in a warm, moist environment and your shoes offer such a location.  IS IT CONTAGIOUS? Theoretically, yes.  You do not want to share shoes, nail clippers or files with someone who has fungal toenails.  Walking around barefoot in the same room or sleeping in the same bed is unlikely to transfer the organism.  It is important to realize, however, that fungus can spread easily from one nail to the next on the same foot.  HOW DO WE TREAT THIS?  There are several ways to treat this condition.  Treatment may depend on many factors such as age, medications, pregnancy, liver and kidney conditions, etc.  It is best to ask your doctor which options are available to you.  1. No treatment.   Unlike many other medical concerns, you can live with this condition.  However for many people this can be a painful condition and may lead to ingrown toenails or a bacterial infection.  It is recommended that you keep the nails cut short to help reduce the amount of fungal nail. 2. Topical treatment.  These range from herbal remedies to prescription strength nail lacquers.  About 40-50% effective, topicals require twice daily application for approximately 9 to 12 months or until an entirely new nail has grown out.  The most effective topicals are medical grade medications available through physicians offices. 3. Oral antifungal medications.  With an 80-90% cure rate, the most common oral medication requires 3 to 4 months of therapy and stays in your system for a year as the new nail grows out.  Oral  antifungal medications do require blood work to make sure it is a safe drug for you.  A liver function panel will be performed prior to starting the medication and after the first month of treatment.  It is important to have the blood work performed to avoid any harmful side effects.  In general, this medication safe but blood work is required. 4. Laser Therapy.  This treatment is performed by applying a specialized laser to the affected nail plate.  This therapy is noninvasive, fast, and non-painful.  It is not covered by insurance and is therefore, out of pocket.  The results have been very good with a 80-95% cure rate.  [facility] is the only practice in the area to offer this therapy. 5. Permanent Nail Avulsion.  Removing the entire nail so that a new nail will not grow back.  Apply prescribed medication as follows the kerydin should be applied one drop to each affected toenail once daily for 12 months duration. Discontinue dizzy skin irritation or other difficulties

## 2014-04-26 ENCOUNTER — Ambulatory Visit
Admission: RE | Admit: 2014-04-26 | Discharge: 2014-04-26 | Disposition: A | Discharge status: Home / Self Care | Payer: Medicare Other | Source: Ambulatory Visit

## 2014-04-26 DIAGNOSIS — Z1231 Encounter for screening mammogram for malignant neoplasm of breast: Secondary | ICD-10-CM

## 2014-12-19 DIAGNOSIS — Z6834 Body mass index (BMI) 34.0-34.9, adult: Secondary | ICD-10-CM | POA: Diagnosis not present

## 2014-12-19 DIAGNOSIS — M5136 Other intervertebral disc degeneration, lumbar region: Secondary | ICD-10-CM | POA: Diagnosis not present

## 2014-12-19 DIAGNOSIS — M545 Low back pain: Secondary | ICD-10-CM | POA: Diagnosis not present

## 2014-12-19 DIAGNOSIS — M4806 Spinal stenosis, lumbar region: Secondary | ICD-10-CM | POA: Diagnosis not present

## 2014-12-19 DIAGNOSIS — M5416 Radiculopathy, lumbar region: Secondary | ICD-10-CM | POA: Diagnosis not present

## 2014-12-26 DIAGNOSIS — J069 Acute upper respiratory infection, unspecified: Secondary | ICD-10-CM | POA: Diagnosis not present

## 2014-12-26 DIAGNOSIS — Z6834 Body mass index (BMI) 34.0-34.9, adult: Secondary | ICD-10-CM | POA: Diagnosis not present

## 2014-12-26 DIAGNOSIS — I10 Essential (primary) hypertension: Secondary | ICD-10-CM | POA: Diagnosis not present

## 2014-12-26 DIAGNOSIS — M5416 Radiculopathy, lumbar region: Secondary | ICD-10-CM | POA: Diagnosis not present

## 2014-12-28 DIAGNOSIS — M4806 Spinal stenosis, lumbar region: Secondary | ICD-10-CM | POA: Diagnosis not present

## 2015-01-02 DIAGNOSIS — M5136 Other intervertebral disc degeneration, lumbar region: Secondary | ICD-10-CM | POA: Diagnosis not present

## 2015-01-02 DIAGNOSIS — M545 Low back pain: Secondary | ICD-10-CM | POA: Diagnosis not present

## 2015-01-02 DIAGNOSIS — M4806 Spinal stenosis, lumbar region: Secondary | ICD-10-CM | POA: Diagnosis not present

## 2015-01-02 DIAGNOSIS — Z6834 Body mass index (BMI) 34.0-34.9, adult: Secondary | ICD-10-CM | POA: Diagnosis not present

## 2015-01-02 DIAGNOSIS — M5416 Radiculopathy, lumbar region: Secondary | ICD-10-CM | POA: Diagnosis not present

## 2015-01-03 ENCOUNTER — Other Ambulatory Visit (HOSPITAL_COMMUNITY): Payer: Self-pay | Admitting: Neurosurgery

## 2015-01-16 DIAGNOSIS — I10 Essential (primary) hypertension: Secondary | ICD-10-CM | POA: Diagnosis not present

## 2015-01-16 DIAGNOSIS — M545 Low back pain: Secondary | ICD-10-CM | POA: Diagnosis not present

## 2015-01-16 DIAGNOSIS — J069 Acute upper respiratory infection, unspecified: Secondary | ICD-10-CM | POA: Diagnosis not present

## 2015-01-16 DIAGNOSIS — Z6834 Body mass index (BMI) 34.0-34.9, adult: Secondary | ICD-10-CM | POA: Diagnosis not present

## 2015-01-25 ENCOUNTER — Encounter (HOSPITAL_COMMUNITY)
Admission: RE | Admit: 2015-01-25 | Discharge: 2015-01-25 | Disposition: A | Discharge status: Home / Self Care | Payer: Medicare Other | Source: Ambulatory Visit | Attending: Neurosurgery | Admitting: Neurosurgery

## 2015-01-25 ENCOUNTER — Encounter (HOSPITAL_COMMUNITY): Payer: Self-pay

## 2015-01-25 DIAGNOSIS — M4806 Spinal stenosis, lumbar region: Secondary | ICD-10-CM | POA: Insufficient documentation

## 2015-01-25 DIAGNOSIS — Z01812 Encounter for preprocedural laboratory examination: Secondary | ICD-10-CM | POA: Diagnosis not present

## 2015-01-25 DIAGNOSIS — Z0183 Encounter for blood typing: Secondary | ICD-10-CM | POA: Diagnosis not present

## 2015-01-25 DIAGNOSIS — Z01810 Encounter for preprocedural cardiovascular examination: Secondary | ICD-10-CM | POA: Diagnosis not present

## 2015-01-25 HISTORY — DX: Other specified postprocedural states: Z98.890

## 2015-01-25 HISTORY — DX: Essential (primary) hypertension: I10

## 2015-01-25 HISTORY — DX: Nausea with vomiting, unspecified: R11.2

## 2015-01-25 LAB — BASIC METABOLIC PANEL
Anion gap: 10 (ref 5–15)
BUN: 16 mg/dL (ref 6–23)
CO2: 25 mmol/L (ref 19–32)
Calcium: 9.6 mg/dL (ref 8.4–10.5)
Chloride: 103 mmol/L (ref 96–112)
Creatinine, Ser: 0.95 mg/dL (ref 0.50–1.10)
GFR calc Af Amer: 69 mL/min — ABNORMAL LOW (ref >90)
GFR calc non Af Amer: 60 mL/min — ABNORMAL LOW (ref >90)
GLUCOSE: 110 mg/dL — AB (ref 70–99)
POTASSIUM: 3.9 mmol/L (ref 3.5–5.1)
SODIUM: 138 mmol/L (ref 135–145)

## 2015-01-25 LAB — CBC
HCT: 36.9 % (ref 36.0–46.0)
HEMOGLOBIN: 11.8 g/dL — AB (ref 12.0–15.0)
MCH: 26.6 pg (ref 26.0–34.0)
MCHC: 32 g/dL (ref 30.0–36.0)
MCV: 83.1 fL (ref 78.0–100.0)
PLATELETS: 221 10*3/uL (ref 150–400)
RBC: 4.44 MIL/uL (ref 3.87–5.11)
RDW: 15 % (ref 11.5–15.5)
WBC: 5.6 10*3/uL (ref 4.0–10.5)

## 2015-01-25 LAB — TYPE AND SCREEN
ABO/RH(D): O POS
Antibody Screen: NEGATIVE

## 2015-01-25 LAB — ABO/RH: ABO/RH(D): O POS

## 2015-01-25 LAB — SURGICAL PCR SCREEN
MRSA, PCR: NEGATIVE
STAPHYLOCOCCUS AUREUS: POSITIVE — AB

## 2015-01-25 NOTE — Pre-Procedure Instructions (Addendum)
[name]  01/25/2015   Your procedure is scheduled on:  02/04/15  Report to [facility] short stay admitting at 845 AM.  Call this number if you have problems the morning of surgery: [phone]   Remember:   Do not eat food or drink liquids after midnight.   Take these medicines the morning of surgery with A SIP OF WATER: pain med if needed    STOP all herbel meds, nsaids (aleve,naproxen,advil,ibuprofen) 5 days prior to surgery starting 01/30/15 including vitamins,aspirin,    Do not wear jewelry, make-up or nail polish.  Do not wear lotions, powders, or perfumes. You may wear deodorant.  Do not shave 48 hours prior to surgery. Men may shave face and neck.  Do not bring valuables to the hospital.  [facility] is not responsible                  for any belongings or valuables.               Contacts, dentures or bridgework may not be worn into surgery.  Leave suitcase in the car. After surgery it may be brought to your room.  For patients admitted to the hospital, discharge time is determined by your                treatment team.               Patients discharged the day of surgery will not be allowed to drive  home.  Name and phone number of your driver:   Special Instructions:  Special Instructions: [name] - Preparing for Surgery  Before surgery, you can play an important role.  Because skin is not sterile, your skin needs to be as free of germs as possible.  You can reduce the number of germs on you skin by washing with CHG (chlorahexidine gluconate) soap before surgery.  CHG is an antiseptic cleaner which kills germs and bonds with the skin to continue killing germs even after washing.  Please DO NOT use if you have an allergy to CHG or antibacterial soaps.  If your skin becomes reddened/irritated stop using the CHG and inform your nurse when you arrive at Short Stay.  Do not shave (including legs and underarms) for at least 48 hours prior to the first CHG shower.  You may  shave your face.  Please follow these instructions carefully:   1.  Shower with CHG Soap the night before surgery and the morning of Surgery.  2.  If you choose to wash your hair, wash your hair first as usual with your normal shampoo.  3.  After you shampoo, rinse your hair and body thoroughly to remove the Shampoo.  4.  Use CHG as you would any other liquid soap.  You can apply chg directly  to the skin and wash gently with scrungie or a clean washcloth.  5.  Apply the CHG Soap to your body ONLY FROM THE NECK DOWN.  Do not use on open wounds or open sores.  Avoid contact with your eyes ears, mouth and genitals (private parts).  Wash genitals (private parts)       with your normal soap.  6.  Wash thoroughly, paying special attention to the area where your surgery will be performed.  7.  Thoroughly rinse your body with warm water from the neck down.  8.  DO NOT shower/wash with your normal soap after using and rinsing off the CHG Soap.  9.  Pat yourself dry with a clean towel.            10.  Wear clean pajamas.            11.  Place clean sheets on your bed the night of your first shower and do not sleep with pets.  Day of Surgery  Do not apply any lotions/deodorants the morning of surgery.  Please wear clean clothes to the hospital/surgery center.   Please read over the following fact sheets that you were given: Pain Booklet, Coughing and Deep Breathing, Blood Transfusion Information, MRSA Information and Surgical Site Infection Prevention

## 2015-02-03 MED ORDER — CEFAZOLIN SODIUM-DEXTROSE 2-3 GM-% IV SOLR
2.0000 g | INTRAVENOUS | Status: AC
  Administered 2015-02-04 (×2): 2 g via INTRAVENOUS
  Filled 2015-02-03: qty 50

## 2015-02-04 ENCOUNTER — Encounter (HOSPITAL_COMMUNITY): Payer: Self-pay

## 2015-02-04 ENCOUNTER — Inpatient Hospital Stay (HOSPITAL_COMMUNITY): Payer: Medicare Other

## 2015-02-04 ENCOUNTER — Inpatient Hospital Stay (HOSPITAL_COMMUNITY): Payer: Medicare Other | Admitting: Certified Registered Nurse Anesthetist

## 2015-02-04 ENCOUNTER — Encounter (HOSPITAL_COMMUNITY)
Admission: RE | Disposition: A | Discharge status: Home / Self Care | Payer: Medicare Other | Source: Ambulatory Visit | Attending: Neurosurgery

## 2015-02-04 ENCOUNTER — Inpatient Hospital Stay (HOSPITAL_COMMUNITY)
Admission: RE | Admit: 2015-02-04 | Discharge: 2015-02-05 | DRG: 460 | Disposition: A | Discharge status: Home / Self Care | Payer: Medicare Other | Source: Ambulatory Visit | Attending: Neurosurgery | Admitting: Neurosurgery

## 2015-02-04 DIAGNOSIS — M4317 Spondylolisthesis, lumbosacral region: Secondary | ICD-10-CM | POA: Diagnosis not present

## 2015-02-04 DIAGNOSIS — M4806 Spinal stenosis, lumbar region: Secondary | ICD-10-CM | POA: Diagnosis present

## 2015-02-04 DIAGNOSIS — M48061 Spinal stenosis, lumbar region without neurogenic claudication: Secondary | ICD-10-CM

## 2015-02-04 DIAGNOSIS — M1990 Unspecified osteoarthritis, unspecified site: Secondary | ICD-10-CM | POA: Diagnosis not present

## 2015-02-04 DIAGNOSIS — M4326 Fusion of spine, lumbar region: Secondary | ICD-10-CM | POA: Diagnosis not present

## 2015-02-04 DIAGNOSIS — I10 Essential (primary) hypertension: Secondary | ICD-10-CM | POA: Diagnosis not present

## 2015-02-04 DIAGNOSIS — M5116 Intervertebral disc disorders with radiculopathy, lumbar region: Secondary | ICD-10-CM | POA: Diagnosis not present

## 2015-02-04 DIAGNOSIS — Z9889 Other specified postprocedural states: Secondary | ICD-10-CM | POA: Diagnosis not present

## 2015-02-04 DIAGNOSIS — M5416 Radiculopathy, lumbar region: Secondary | ICD-10-CM | POA: Diagnosis not present

## 2015-02-04 DIAGNOSIS — M549 Dorsalgia, unspecified: Secondary | ICD-10-CM | POA: Diagnosis present

## 2015-02-04 DIAGNOSIS — M5106 Intervertebral disc disorders with myelopathy, lumbar region: Secondary | ICD-10-CM | POA: Diagnosis not present

## 2015-02-04 DIAGNOSIS — M48062 Spinal stenosis, lumbar region with neurogenic claudication: Secondary | ICD-10-CM | POA: Diagnosis present

## 2015-02-04 DIAGNOSIS — M961 Postlaminectomy syndrome, not elsewhere classified: Secondary | ICD-10-CM | POA: Diagnosis not present

## 2015-02-04 SURGERY — POSTERIOR LUMBAR FUSION 2 LEVEL
Anesthesia: General | Site: Spine Lumbar

## 2015-02-04 MED ORDER — SODIUM CHLORIDE 0.9 % IR SOLN
Status: DC | PRN
  Administered 2015-02-04: 13:00:00

## 2015-02-04 MED ORDER — LACTATED RINGERS IV SOLN
INTRAVENOUS | Status: DC | PRN
  Administered 2015-02-04 (×2): via INTRAVENOUS

## 2015-02-04 MED ORDER — ALBUTEROL SULFATE HFA 108 (90 BASE) MCG/ACT IN AERS
INHALATION_SPRAY | RESPIRATORY_TRACT | Status: AC
  Filled 2015-02-04: qty 6.7

## 2015-02-04 MED ORDER — FENTANYL CITRATE 0.05 MG/ML IJ SOLN
INTRAMUSCULAR | Status: AC
  Filled 2015-02-04: qty 5

## 2015-02-04 MED ORDER — OXYCODONE-ACETAMINOPHEN 5-325 MG PO TABS
1.0000 | ORAL_TABLET | ORAL | Status: DC | PRN
  Administered 2015-02-04 – 2015-02-05 (×3): 2 via ORAL
  Filled 2015-02-04 (×3): qty 2

## 2015-02-04 MED ORDER — ALUM & MAG HYDROXIDE-SIMETH 200-200-20 MG/5ML PO SUSP: 30.0000 mL | Freq: Four times a day (QID) | ORAL | Status: DC | PRN

## 2015-02-04 MED ORDER — PHENYLEPHRINE HCL 10 MG/ML IJ SOLN
10.0000 mg | INTRAVENOUS | Status: DC | PRN
  Administered 2015-02-04: 10 ug/min via INTRAVENOUS

## 2015-02-04 MED ORDER — THROMBIN 20000 UNITS EX SOLR
CUTANEOUS | Status: DC | PRN
  Administered 2015-02-04 (×2): via TOPICAL

## 2015-02-04 MED ORDER — LIDOCAINE HCL (CARDIAC) 20 MG/ML IV SOLN
INTRAVENOUS | Status: AC
  Filled 2015-02-04: qty 10

## 2015-02-04 MED ORDER — NEOSTIGMINE METHYLSULFATE 10 MG/10ML IV SOLN
INTRAVENOUS | Status: AC
  Filled 2015-02-04: qty 1

## 2015-02-04 MED ORDER — PHENYLEPHRINE HCL 10 MG/ML IJ SOLN
INTRAMUSCULAR | Status: DC | PRN
  Administered 2015-02-04 (×2): 40 ug via INTRAVENOUS

## 2015-02-04 MED ORDER — ACETAMINOPHEN 650 MG RE SUPP: 650.0000 mg | RECTAL | Status: DC | PRN

## 2015-02-04 MED ORDER — PROPOFOL 10 MG/ML IV BOLUS
INTRAVENOUS | Status: DC | PRN
  Administered 2015-02-04: 200 mg via INTRAVENOUS

## 2015-02-04 MED ORDER — ROCURONIUM BROMIDE 100 MG/10ML IV SOLN
INTRAVENOUS | Status: DC | PRN
  Administered 2015-02-04: 50 mg via INTRAVENOUS

## 2015-02-04 MED ORDER — ARTIFICIAL TEARS OP OINT
TOPICAL_OINTMENT | OPHTHALMIC | Status: AC
  Filled 2015-02-04: qty 3.5

## 2015-02-04 MED ORDER — VANCOMYCIN HCL 1000 MG IV SOLR
INTRAVENOUS | Status: DC | PRN
  Administered 2015-02-04: 1000 mg via TOPICAL

## 2015-02-04 MED ORDER — CEFAZOLIN SODIUM-DEXTROSE 2-3 GM-% IV SOLR
INTRAVENOUS | Status: AC
  Filled 2015-02-04: qty 50

## 2015-02-04 MED ORDER — DOCUSATE SODIUM 100 MG PO CAPS: 100.0000 mg | ORAL_CAPSULE | Freq: Two times a day (BID) | ORAL | Status: DC

## 2015-02-04 MED ORDER — GLYCOPYRROLATE 0.2 MG/ML IJ SOLN
INTRAMUSCULAR | Status: DC | PRN
  Administered 2015-02-04: .8 mg via INTRAVENOUS

## 2015-02-04 MED ORDER — PHENOL 1.4 % MT LIQD: 1.0000 | OROMUCOSAL | Status: DC | PRN

## 2015-02-04 MED ORDER — EPHEDRINE SULFATE 50 MG/ML IJ SOLN
INTRAMUSCULAR | Status: AC
  Filled 2015-02-04: qty 1

## 2015-02-04 MED ORDER — BUPIVACAINE LIPOSOME 1.3 % IJ SUSP
INTRAMUSCULAR | Status: DC | PRN
  Administered 2015-02-04: 20 mL

## 2015-02-04 MED ORDER — DIAZEPAM 5 MG PO TABS
5.0000 mg | ORAL_TABLET | Freq: Four times a day (QID) | ORAL | Status: DC | PRN
  Administered 2015-02-04 – 2015-02-05 (×2): 5 mg via ORAL
  Filled 2015-02-04 (×2): qty 1

## 2015-02-04 MED ORDER — 0.9 % SODIUM CHLORIDE (POUR BTL) OPTIME
TOPICAL | Status: DC | PRN
  Administered 2015-02-04: 1000 mL

## 2015-02-04 MED ORDER — SUCCINYLCHOLINE CHLORIDE 20 MG/ML IJ SOLN
INTRAMUSCULAR | Status: AC
  Filled 2015-02-04: qty 1

## 2015-02-04 MED ORDER — ACETAMINOPHEN 325 MG PO TABS: 650.0000 mg | ORAL_TABLET | ORAL | Status: DC | PRN

## 2015-02-04 MED ORDER — ROCURONIUM BROMIDE 50 MG/5ML IV SOLN
INTRAVENOUS | Status: AC
  Filled 2015-02-04: qty 2

## 2015-02-04 MED ORDER — SUCCINYLCHOLINE CHLORIDE 20 MG/ML IJ SOLN
INTRAMUSCULAR | Status: DC | PRN
  Administered 2015-02-04: 120 mg via INTRAVENOUS

## 2015-02-04 MED ORDER — MORPHINE SULFATE 2 MG/ML IJ SOLN
1.0000 mg | INTRAMUSCULAR | Status: DC | PRN
  Administered 2015-02-04 (×2): 2 mg via INTRAVENOUS
  Filled 2015-02-04 (×2): qty 1

## 2015-02-04 MED ORDER — MIDAZOLAM HCL 5 MG/5ML IJ SOLN
INTRAMUSCULAR | Status: DC | PRN
  Administered 2015-02-04: 1 mg via INTRAVENOUS

## 2015-02-04 MED ORDER — BUPIVACAINE-EPINEPHRINE (PF) 0.5% -1:200000 IJ SOLN
INTRAMUSCULAR | Status: DC | PRN
  Administered 2015-02-04: 10 mL

## 2015-02-04 MED ORDER — BISACODYL 10 MG RE SUPP: 10.0000 mg | Freq: Every day | RECTAL | Status: DC | PRN

## 2015-02-04 MED ORDER — NEOSTIGMINE METHYLSULFATE 10 MG/10ML IV SOLN
INTRAVENOUS | Status: DC | PRN
  Administered 2015-02-04: 5 mg via INTRAVENOUS

## 2015-02-04 MED ORDER — VECURONIUM BROMIDE 10 MG IV SOLR
INTRAVENOUS | Status: DC | PRN
  Administered 2015-02-04: 2 mg via INTRAVENOUS

## 2015-02-04 MED ORDER — PHENYLEPHRINE HCL 10 MG/ML IJ SOLN
INTRAMUSCULAR | Status: AC
  Filled 2015-02-04: qty 1

## 2015-02-04 MED ORDER — FENTANYL CITRATE 0.05 MG/ML IJ SOLN
INTRAMUSCULAR | Status: DC | PRN
  Administered 2015-02-04 (×4): 50 ug via INTRAVENOUS
  Administered 2015-02-04: 25 ug via INTRAVENOUS
  Administered 2015-02-04 (×4): 50 ug via INTRAVENOUS
  Administered 2015-02-04: 25 ug via INTRAVENOUS

## 2015-02-04 MED ORDER — VANCOMYCIN HCL 1000 MG IV SOLR
INTRAVENOUS | Status: AC
  Filled 2015-02-04: qty 1000

## 2015-02-04 MED ORDER — MUPIROCIN 2 % EX OINT: 1.0000 "application " | TOPICAL_OINTMENT | Freq: Two times a day (BID) | CUTANEOUS | Status: DC

## 2015-02-04 MED ORDER — FLEET ENEMA 7-19 GM/118ML RE ENEM
1.0000 | ENEMA | Freq: Once | RECTAL | Status: AC | PRN
  Filled 2015-02-04: qty 1

## 2015-02-04 MED ORDER — HYDROMORPHONE HCL 1 MG/ML IJ SOLN
0.2500 mg | INTRAMUSCULAR | Status: DC | PRN
  Administered 2015-02-04 (×2): 0.5 mg via INTRAVENOUS

## 2015-02-04 MED ORDER — PHENYLEPHRINE 40 MCG/ML (10ML) SYRINGE FOR IV PUSH (FOR BLOOD PRESSURE SUPPORT)
PREFILLED_SYRINGE | INTRAVENOUS | Status: AC
  Filled 2015-02-04: qty 20

## 2015-02-04 MED ORDER — LACTATED RINGERS IV SOLN: INTRAVENOUS | Status: DC

## 2015-02-04 MED ORDER — CEFAZOLIN SODIUM-DEXTROSE 2-3 GM-% IV SOLR
2.0000 g | Freq: Three times a day (TID) | INTRAVENOUS | Status: DC
  Administered 2015-02-04: 2 g via INTRAVENOUS
  Filled 2015-02-04 (×2): qty 50

## 2015-02-04 MED ORDER — MENTHOL 3 MG MT LOZG: 1.0000 | LOZENGE | OROMUCOSAL | Status: DC | PRN

## 2015-02-04 MED ORDER — MIDAZOLAM HCL 2 MG/2ML IJ SOLN
INTRAMUSCULAR | Status: AC
  Filled 2015-02-04: qty 2

## 2015-02-04 MED ORDER — ONDANSETRON HCL 4 MG/2ML IJ SOLN
INTRAMUSCULAR | Status: DC | PRN
  Administered 2015-02-04: 4 mg via INTRAVENOUS

## 2015-02-04 MED ORDER — BUPIVACAINE LIPOSOME 1.3 % IJ SUSP
20.0000 mL | Freq: Once | INTRAMUSCULAR | Status: DC
  Filled 2015-02-04: qty 20

## 2015-02-04 MED ORDER — LIDOCAINE HCL (CARDIAC) 20 MG/ML IV SOLN
INTRAVENOUS | Status: DC | PRN
  Administered 2015-02-04: 60 mg via INTRAVENOUS

## 2015-02-04 MED ORDER — HYDROCODONE-ACETAMINOPHEN 5-325 MG PO TABS: 1.0000 | ORAL_TABLET | ORAL | Status: DC | PRN

## 2015-02-04 MED ORDER — LACTATED RINGERS IV SOLN
INTRAVENOUS | Status: DC
  Administered 2015-02-04: 11:00:00 via INTRAVENOUS

## 2015-02-04 MED ORDER — EPHEDRINE SULFATE 50 MG/ML IJ SOLN
INTRAMUSCULAR | Status: DC | PRN
  Administered 2015-02-04: 5 mg via INTRAVENOUS

## 2015-02-04 MED ORDER — GLYCOPYRROLATE 0.2 MG/ML IJ SOLN
INTRAMUSCULAR | Status: AC
  Filled 2015-02-04: qty 4

## 2015-02-04 MED ORDER — SODIUM CHLORIDE 0.9 % IJ SOLN
INTRAMUSCULAR | Status: AC
  Filled 2015-02-04: qty 30

## 2015-02-04 MED ORDER — ONDANSETRON HCL 4 MG/2ML IJ SOLN
4.0000 mg | INTRAMUSCULAR | Status: DC | PRN
  Administered 2015-02-04: 4 mg via INTRAVENOUS
  Filled 2015-02-04: qty 2

## 2015-02-04 MED ORDER — HYDROMORPHONE HCL 1 MG/ML IJ SOLN
INTRAMUSCULAR | Status: AC
  Administered 2015-02-04: 0.5 mg via INTRAVENOUS
  Filled 2015-02-04: qty 1

## 2015-02-04 MED ORDER — ONDANSETRON HCL 4 MG/2ML IJ SOLN: 4.0000 mg | Freq: Once | INTRAMUSCULAR | Status: DC | PRN

## 2015-02-04 SURGICAL SUPPLY — 70 items
APL SKNCLS STERI-STRIP NONHPOA (GAUZE/BANDAGES/DRESSINGS) ×1
APL SRG 60D 8 XTD TIP BNDBL (TIP) ×1
BAG DECANTER FOR FLEXI CONT (MISCELLANEOUS) ×2 IMPLANT
BENZOIN TINCTURE PRP APPL 2/3 (GAUZE/BANDAGES/DRESSINGS) ×2 IMPLANT
BLADE CLIPPER SURG (BLADE) IMPLANT
BRUSH SCRUB EZ PLAIN DRY (MISCELLANEOUS) ×2 IMPLANT
BUR MATCHSTICK NEURO 3.0 LAGG (BURR) ×2 IMPLANT
BUR PRECISION FLUTE 6.0 (BURR) ×2 IMPLANT
CANISTER SUCT 3000ML PPV (MISCELLANEOUS) ×2 IMPLANT
CAP REVERE LOCKING (Cap) ×6 IMPLANT
CONT SPEC 4OZ CLIKSEAL STRL BL (MISCELLANEOUS) ×2 IMPLANT
COVER BACK TABLE 60X90IN (DRAPES) ×2 IMPLANT
DRAPE C-ARM 42X72 X-RAY (DRAPES) ×4 IMPLANT
DRAPE LAPAROTOMY 100X72X124 (DRAPES) ×2 IMPLANT
DRAPE POUCH INSTRU U-SHP 10X18 (DRAPES) ×2 IMPLANT
DRAPE PROXIMA HALF (DRAPES) ×2 IMPLANT
DRAPE SURG 17X23 STRL (DRAPES) ×8 IMPLANT
DURASEAL APPLICATOR TIP (TIP) ×1 IMPLANT
DURASEAL SPINE SEALANT 3ML (MISCELLANEOUS) ×1 IMPLANT
ELECT BLADE 4.0 EZ CLEAN MEGAD (MISCELLANEOUS) ×2
ELECT REM PT RETURN 9FT ADLT (ELECTROSURGICAL) ×2
ELECTRODE BLDE 4.0 EZ CLN MEGD (MISCELLANEOUS) ×1 IMPLANT
ELECTRODE REM PT RTRN 9FT ADLT (ELECTROSURGICAL) ×1 IMPLANT
EVACUATOR 1/8 PVC DRAIN (DRAIN) ×1 IMPLANT
GAUZE SPONGE 4X4 12PLY STRL (GAUZE/BANDAGES/DRESSINGS) ×2 IMPLANT
GAUZE SPONGE 4X4 16PLY XRAY LF (GAUZE/BANDAGES/DRESSINGS) ×2 IMPLANT
GLOVE BIO SURGEON STRL SZ8 (GLOVE) ×4 IMPLANT
GLOVE BIO SURGEON STRL SZ8.5 (GLOVE) ×4 IMPLANT
GLOVE BIOGEL PI IND STRL 7.5 (GLOVE) IMPLANT
GLOVE BIOGEL PI INDICATOR 7.5 (GLOVE) ×3
GLOVE ECLIPSE 7.5 STRL STRAW (GLOVE) ×4 IMPLANT
GLOVE ECLIPSE 9.0 STRL (GLOVE) ×1 IMPLANT
GLOVE EXAM NITRILE LRG STRL (GLOVE) IMPLANT
GLOVE EXAM NITRILE MD LF STRL (GLOVE) IMPLANT
GLOVE EXAM NITRILE XL STR (GLOVE) IMPLANT
GLOVE EXAM NITRILE XS STR PU (GLOVE) IMPLANT
GOWN STRL REUS W/ TWL LRG LVL3 (GOWN DISPOSABLE) IMPLANT
GOWN STRL REUS W/ TWL XL LVL3 (GOWN DISPOSABLE) ×2 IMPLANT
GOWN STRL REUS W/TWL 2XL LVL3 (GOWN DISPOSABLE) IMPLANT
GOWN STRL REUS W/TWL LRG LVL3 (GOWN DISPOSABLE) ×2
GOWN STRL REUS W/TWL XL LVL3 (GOWN DISPOSABLE) ×8
KIT BASIN OR (CUSTOM PROCEDURE TRAY) ×2 IMPLANT
KIT ROOM TURNOVER OR (KITS) ×2 IMPLANT
NDL HYPO 21X1.5 SAFETY (NEEDLE) IMPLANT
NEEDLE HYPO 21X1.5 SAFETY (NEEDLE) IMPLANT
NEEDLE HYPO 22GX1.5 SAFETY (NEEDLE) ×3 IMPLANT
NS IRRIG 1000ML POUR BTL (IV SOLUTION) ×2 IMPLANT
PACK LAMINECTOMY NEURO (CUSTOM PROCEDURE TRAY) ×2 IMPLANT
PAD ARMBOARD 7.5X6 YLW CONV (MISCELLANEOUS) ×6 IMPLANT
PATTIES SURGICAL .5 X1 (DISPOSABLE) IMPLANT
ROD REVERE 6.35 CURVED 60MM (Rod) ×2 IMPLANT
SCREW REVERE 6.35 6.5MMX45 (Screw) ×4 IMPLANT
SCREW REVERE 6.5X50MM (Screw) ×2 IMPLANT
SPACER SUSTAIN O 10X26 8MM (Spacer) ×2 IMPLANT
SPONGE LAP 4X18 X RAY DECT (DISPOSABLE) IMPLANT
SPONGE NEURO XRAY DETECT 1X3 (DISPOSABLE) IMPLANT
SPONGE SURGIFOAM ABS GEL 100 (HEMOSTASIS) ×2 IMPLANT
STRIP BIOACTIVE 20CC 25X100X8 (Miscellaneous) ×1 IMPLANT
STRIP CLOSURE SKIN 1/2X4 (GAUZE/BANDAGES/DRESSINGS) ×2 IMPLANT
SUT PROLENE 6 0 BV (SUTURE) ×2 IMPLANT
SUT VIC AB 1 CT1 18XBRD ANBCTR (SUTURE) ×2 IMPLANT
SUT VIC AB 1 CT1 8-18 (SUTURE) ×2
SUT VIC AB 2-0 CP2 18 (SUTURE) ×4 IMPLANT
SYR 20CC LL (SYRINGE) ×1 IMPLANT
SYR 20ML ECCENTRIC (SYRINGE) ×2 IMPLANT
TAPE CLOTH SURG 4X10 WHT LF (GAUZE/BANDAGES/DRESSINGS) ×1 IMPLANT
TOWEL OR 17X24 6PK STRL BLUE (TOWEL DISPOSABLE) ×2 IMPLANT
TOWEL OR 17X26 10 PK STRL BLUE (TOWEL DISPOSABLE) ×2 IMPLANT
TRAY FOLEY CATH 14FRSI W/METER (CATHETERS) ×2 IMPLANT
WATER STERILE IRR 1000ML POUR (IV SOLUTION) ×2 IMPLANT

## 2015-02-04 NOTE — Transfer of Care (Signed)
Immediate Anesthesia Transfer of Care Note  Patient: TWALA COLLINGS  Procedure(s) Performed: Procedure(s) with comments: LUMBAR FOUR-FIVE, LUMBAR FIVE-SACRAL ONE POSTERIOR LUMBAR FUSION 2 LEVEL (N/A) - L45 L5S1 posterior lumbar interbody fusion with interbody prosthesis posterior lateral arthrodesis and posterior segmental instrumenation  Patient Location: PACU  Anesthesia Type:General  Level of Consciousness: awake, alert  and oriented  Airway & Oxygen Therapy: Patient Spontanous Breathing and Patient connected to face mask oxygen  Post-op Assessment: Report given to RN and Post -op Vital signs reviewed and stable  Post vital signs: Reviewed and stable  Last Vitals:  Filed Vitals:   02/04/15 0924  BP: 163/93  Pulse: 71  Temp: 37 C  Resp: 18    Complications: No apparent anesthesia complications

## 2015-02-04 NOTE — Anesthesia Procedure Notes (Signed)
Procedure Name: Intubation Date/Time: 02/04/2015 12:40 PM Performed by: Maeola Harman Pre-anesthesia Checklist: Patient identified, Emergency Drugs available, Suction available, Patient being monitored and Timeout performed Patient Re-evaluated:Patient Re-evaluated prior to inductionOxygen Delivery Method: Circle system utilized Preoxygenation: Pre-oxygenation with 100% oxygen Intubation Type: IV induction Ventilation: Mask ventilation without difficulty Laryngoscope Size: Mac and 3 Grade View: Grade I Tube type: Oral Tube size: 7.5 mm Number of attempts: 1 Airway Equipment and Method: Stylet Placement Confirmation: ETT inserted through vocal cords under direct vision,  positive ETCO2 and breath sounds checked- equal and bilateral Secured at: 22 cm Tube secured with: Tape Dental Injury: Teeth and Oropharynx as per pre-operative assessment

## 2015-02-04 NOTE — H&P (Signed)
Subjective: The patient is a 69 year old white female who has complained of back and leg pain consistent with neurogenic claudication. She has failed medical management and was worked up with a lumbar MRI and lumbar x-rays. 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 I discussed the various treatment options with the patient including surgery. She has weighed the risks, benefits, and alternative surgery and decided proceed with an L4-5 and L5-S1 decompression, instrumentation, and fusion.   Past Medical History  Diagnosis Date  . Arthritis   . PONV (postoperative nausea and vomiting)   . Hypertension     Past Surgical History  Procedure Laterality Date  . Back surgery  2010  . Abdominal hysterectomy  82  . Fracture surgery Right     74    No Known Allergies  History  Substance Use Topics  . Smoking status: Never Smoker   . Smokeless tobacco: Not on file  . Alcohol Use: No    Family History  Problem Relation Age of Onset  . Hypertension Mother    Prior to Admission medications   Medication Sig Start Date End Date Taking? Authorizing Provider  HYDROcodone-acetaminophen (NORCO/VICODIN) 5-325 MG per tablet Take 1 tablet by mouth 2 (two) times daily as needed for moderate pain.   Yes Historical Provider, MD  losartan (COZAAR) 25 MG tablet Take 25 mg by mouth daily.   Yes Historical Provider, MD  traMADol (ULTRAM) 50 MG tablet Take 50 mg by mouth every 6 (six) hours as needed for moderate pain.   Yes Historical Provider, MD  Tavaborole (KERYDIN) 5 % SOLN Apply 1 drop each affected toenail once daily for 12 months Patient not taking: Reported on 01/25/2015 04/17/14   Harriet Masson, DPM     Review of Systems  Positive ROS: As above  All other systems have been reviewed and were otherwise negative with the exception of those mentioned in the HPI and as above.  Objective: Vital signs in last 24 hours: Temp:  [98.6 F (37 C)] 98.6 F (37 C) (03/21  0924) Pulse Rate:  [71] 71 (03/21 0924) Resp:  [18] 18 (03/21 0924) BP: (163)/(93) 163/93 mmHg (03/21 0924) SpO2:  [97 %] 97 % (03/21 0924) Weight:  [92.307 kg (203 lb 8 oz)] 92.307 kg (203 lb 8 oz) (03/21 0924)  General Appearance: Alert, cooperative, no distress, Head: Normocephalic, without obvious abnormality, atraumatic Eyes: PERRL, conjunctiva/corneas clear, EOM's intact,    Ears: Normal  Throat: Normal  Neck: Supple, symmetrical, trachea midline, no adenopathy; thyroid: No enlargement/tenderness/nodules; no carotid bruit or JVD Back: Symmetric, no curvature, ROM normal, no CVA tenderness Lungs: Clear to auscultation bilaterally, respirations unlabored Heart: Regular rate and rhythm, no murmur, rub or gallop Abdomen: Soft, non-tender,, no masses, no organomegaly Extremities: Extremities normal, atraumatic, no cyanosis or edema Pulses: 2+ and symmetric all extremities Skin: Skin color, texture, turgor normal, no rashes or lesions  NEUROLOGIC:   Mental status: alert and oriented, no aphasia, good attention span, Fund of knowledge/ memory ok Motor Exam - grossly normal Sensory Exam - grossly normal Reflexes:  Coordination - grossly normal Gait - grossly normal Balance - grossly normal Cranial Nerves: I: smell Not tested  II: visual acuity  OS: Normal  OD: Normal   II: visual fields Full to confrontation  II: pupils Equal, round, reactive to light  III,VII: ptosis None  III,IV,VI: extraocular muscles  Full ROM  V: mastication Normal  V: facial light touch sensation  Normal  V,VII: corneal reflex  Present  VII: facial muscle function - upper  Normal  VII: facial muscle function - lower Normal  VIII: hearing Not tested  IX: soft palate elevation  Normal  IX,X: gag reflex Present  XI: trapezius strength  5/5  XI: sternocleidomastoid strength 5/5  XI: neck flexion strength  5/5  XII: tongue strength  Normal    Data Review Lab Results  Component Value Date   WBC 5.6  01/25/2015   HGB 11.8* 01/25/2015   HCT 36.9 01/25/2015   MCV 83.1 01/25/2015   PLT 221 01/25/2015   Lab Results  Component Value Date   NA 138 01/25/2015   K 3.9 01/25/2015   CL 103 01/25/2015   CO2 25 01/25/2015   BUN 16 01/25/2015   CREATININE 0.95 01/25/2015   GLUCOSE 110* 01/25/2015   Lab Results  Component Value Date   INR 1.00 01/27/2011    Assessment/Plan: L4-5 and L5-S1 spondylolisthesis, spinal stenosis, lumbago, lumbar radiculopathy: I have discussed the situation with the patient. I have reviewed her imaging studies with her and pointed out the abnormalities. We have discussed the various treatment options including surgery. I have described the surgical treatment option of an L4-5 and L5-S1 decompression, instrumentation, and fusion. I have shown her surgical models. We have discussed the risks, benefits, alternatives, and likelihood of achieving our goal for surgery. I have answered all the patient's questions. She has decided to proceed with surgery.   Ophelia Charter 02/04/2015 12:29 PM

## 2015-02-04 NOTE — Anesthesia Preprocedure Evaluation (Addendum)
Anesthesia Evaluation  Patient identified by MRN, date of birth, ID band Patient awake    Reviewed: Allergy & Precautions, NPO status , Patient's Chart, lab work & pertinent test results  History of Anesthesia Complications (+) PONV  Airway Mallampati: I  TM Distance: >3 FB     Dental  (+) Edentulous Upper, Edentulous Lower, Dental Advisory Given   Pulmonary neg pulmonary ROS,  breath sounds clear to auscultation        Cardiovascular hypertension, Rhythm:Regular     Neuro/Psych negative neurological ROS  negative psych ROS   GI/Hepatic negative GI ROS, Neg liver ROS,   Endo/Other  negative endocrine ROS  Renal/GU negative Renal ROS     Musculoskeletal negative musculoskeletal ROS (+) Arthritis -,   Abdominal (+)  Abdomen: soft. Bowel sounds: normal.  Peds  Hematology negative hematology ROS (+)   Anesthesia Other Findings   Reproductive/Obstetrics negative OB ROS                           Anesthesia Physical Anesthesia Plan  ASA: I  Anesthesia Plan: General   Post-op Pain Management:    Induction: Intravenous  Airway Management Planned: Oral ETT  Additional Equipment:   Intra-op Plan:   Post-operative Plan: Extubation in OR  Informed Consent: I have reviewed the patients History and Physical, chart, labs and discussed the procedure including the risks, benefits and alternatives for the proposed anesthesia with the patient or authorized representative who has indicated his/her understanding and acceptance.     Plan Discussed with: CRNA, Anesthesiologist and Surgeon  Anesthesia Plan Comments:         Anesthesia Quick Evaluation

## 2015-02-04 NOTE — Progress Notes (Signed)
Patient ID: [name], female   DOB: 05/13/1946, 69 y.o.   MRN: [id number] Subjective:  The patient is alert and pleasant. She is having some nausea. She otherwise looks well.  Objective: Vital signs in last 24 hours: Temp:  [98.6 F (37 C)] 98.6 F (37 C) (03/21 0924) Pulse Rate:  [71] 71 (03/21 0924) Resp:  [18] 18 (03/21 0924) BP: (163)/(93) 163/93 mmHg (03/21 0924) SpO2:  [97 %] 97 % (03/21 0924) Weight:  [92.307 kg (203 lb 8 oz)] 92.307 kg (203 lb 8 oz) (03/21 0924)  Intake/Output from previous day:   Intake/Output this shift: Total I/O In: 1320 [I.V.:1200; Blood:120] Out: 550 [Urine:250; Blood:300]  Physical exam the patient is alert and pleasant. She is moving her lower extremities well.  Lab Results: No results for input(s): WBC, HGB, HCT, PLT in the last 72 hours. BMET No results for input(s): NA, K, CL, CO2, GLUCOSE, BUN, CREATININE, CALCIUM in the last 72 hours.  Studies/Results: Dg Lumbar Spine 2-3 Views  02/04/2015   CLINICAL DATA:  L4-S1 PLIF  EXAM: DG C-ARM 61-120 MIN; LUMBAR SPINE - 2-3 VIEW  FLUOROSCOPY TIME:  Fluoroscopy Time (in minutes and seconds): 0 minutes, 28 seconds  Number of Acquired Images:  2  COMPARISON:  None  FINDINGS: The 2 fluoro spot images reveal placement of pedicle screws bilaterally at L4, L5, and S1. An intradiscal device is present at L4-L5. Connecting rods between the pedicle screws have not yet been attached  IMPRESSION: Intraoperative fluoro spot images reveal the patient to be undergoing L4-S1 PLIF. No immediate complication is demonstrated.   Electronically Signed   By: David  Martinique   On: 02/04/2015 17:13   Dg Lumbar Spine 1 View  02/04/2015   CLINICAL DATA:  L4-5, L5-S1 posterior fusion.  EXAM: LUMBAR SPINE - 1 VIEW  COMPARISON:  12/28/2014  FINDINGS: Single lateral intraoperative image of the lumbar spine demonstrates posterior surgical instruments in place with a localizing instrument directed at the L3-4 level.  IMPRESSION:  Intraoperative localization as above.   Electronically Signed   By: Rolm Baptise M.D.   On: 02/04/2015 15:10   Dg C-arm 61-120 Min  02/04/2015   CLINICAL DATA:  L4-S1 PLIF  EXAM: DG C-ARM 61-120 MIN; LUMBAR SPINE - 2-3 VIEW  FLUOROSCOPY TIME:  Fluoroscopy Time (in minutes and seconds): 0 minutes, 28 seconds  Number of Acquired Images:  2  COMPARISON:  None  FINDINGS: The 2 fluoro spot images reveal placement of pedicle screws bilaterally at L4, L5, and S1. An intradiscal device is present at L4-L5. Connecting rods between the pedicle screws have not yet been attached  IMPRESSION: Intraoperative fluoro spot images reveal the patient to be undergoing L4-S1 PLIF. No immediate complication is demonstrated.   Electronically Signed   By: David  Martinique   On: 02/04/2015 17:13    Assessment/Plan: The patient is doing well except for nausea. We are treating her with medications.  LOS: 0 days     Myha Arizpe D 02/04/2015, 5:57 PM

## 2015-02-04 NOTE — Op Note (Signed)
Brief history: The patient is a 69 year old white female who's had prior back surgery by another physician. She has developed recurrent back, buttock, and leg pain consistent with a lumbar radiculopathy. She has failed medical management and was worked up with lumbar x-rays and lumbar MRI. These demonstrated multilevel degenerative changes most prominent at L4-5 and L5-S1. I discussed the various treatment options with patient including surgery. She has weighed the risks, benefits, and alternative surgery and decided proceed with a L4-5 and L5-S1 redo laminectomy, decompression, fusion and instrumentation.  Preoperative diagnosis: L4-5 and L5-S1 Degenerative disc disease, spinal stenosis compressing both the L4, L5 and the S1 nerve roots; lumbago; lumbar radiculopathy  Postoperative diagnosis: The same  Procedure: Bilateral L4 and L5 Laminotomy/foraminotomies to decompress the bilateral L4, L5 and S1 nerve roots(the work required to do this was in addition to the work required to do the posterior lumbar interbody fusion because of the patient's spinal stenosis, facet arthropathy. Etc. requiring a wide decompression of the nerve roots.); L4-5 posterior lumbar interbody fusion with local morselized autograft bone and Kinnex graft extender; insertion of interbody prosthesis at L4-5 (globus peek interbody prosthesis); posterior segmental instrumentation from L4 to S1 with globus titanium pedicle screws and rods; posterior lateral arthrodesis at L4-5 and L5-S1 with local morselized autograft bone and Kinnex bone graft extender.  Surgeon: Dr. Earle Gell  Asst.: Dr. Granville Lewis  Anesthesia: Gen. endotracheal  Estimated blood loss: 250 mL  Drains: One medium Hemovac  Complications: None  Description of procedure: The patient was brought to the operating room by the anesthesia team. General endotracheal anesthesia was induced. The patient was turned to the prone position on the Wilson frame. The patient's  lumbosacral region was then prepared with Betadine scrub and Betadine solution. Sterile drapes were applied.  I then injected the area to be incised with Marcaine with epinephrine solution. I then used the scalpel to make a linear midline incision over the L4-5 and L5-S1 interspace, incising through the old surgical scar. I then used electrocautery to perform a bilateral subperiosteal dissection exposing the spinous process and lamina of L3, L4, L5 and the upper sacrum. We then obtained intraoperative radiograph to confirm our location. We then inserted the Verstrac retractor to provide exposure.  I began the decompression by using the high speed drill to perform laminotomies at L4 and L5 bilaterally. We then used the Kerrison punches to widen the laminotomy and removed the ligamentum flavum at L4-5 and L5-S1 bilaterally. There was epidural scar tissue at L4-5 bilaterally.. We used the Kerrison punches to remove the medial facets at L4-5 and L5-S1. We performed wide foraminotomies about the bilateral L4, L5 and S1 nerve roots completing the decompression. I did encounter a small durotomy at L4-5 on the left. I repaired it with 6-0 Prolene sutures.  We now turned our attention to the posterior lumbar interbody fusion. I used a scalpel to incise the intervertebral disc at L4-5 (I did not think we could get a prosthesis in the collapsed disc space at L5-S1). I then performed a partial intervertebral discectomy at L4-5 using the pituitary forceps. We prepared the vertebral endplates at T4-1 for the fusion by removing the soft tissues with the curettes. We then used the trial spacers to pick the appropriate sized interbody prosthesis. We prefilled his prosthesis with a combination of local morselized autograft bone that we obtained during the decompression as well as Kinnex bone graft extender. We inserted the prefilled prosthesis into the interspace at L4-5. There was  a good snug fit of the prosthesis in the  interspace. We then filled and the remainder of the intervertebral disc space with local morselized autograft bone and Kinnex. This completed the posterior lumbar interbody arthrodesis.  We now turned attention to the instrumentation. Under fluoroscopic guidance we cannulated the bilateral L4, L5 and S1 pedicles with the bone probe. We then removed the bone probe. We then tapped the pedicle with a high 0.5 millimeter tap. We then removed the tap. We probed inside the tapped pedicle with a ball probe to rule out cortical breaches. We then inserted a 6.5 x 50 and 45 millimeter pedicle screw into the L4, L5 and S1 pedicles bilaterally under fluoroscopic guidance. We then palpated along the medial aspect of the pedicles to rule out cortical breaches. There were none. The nerve roots were not injured. We then connected the unilateral pedicle screws with a lordotic rod. We compressed the construct and secured the rod in place with the caps. We then tightened the caps appropriately. This completed the instrumentation from L4-S1.  We now turned our attention to the posterior lateral arthrodesis at L4-5 and L5-S1. We used the high-speed drill to decorticate the remainder of the facets, pars, transverse process at L4-5 and L5-S1. We then applied a combination of local morselized autograft bone and Kinnex bone graft extender over these decorticated posterior lateral structures. This completed the posterior lateral arthrodesis.  We then obtained hemostasis using bipolar electrocautery. We irrigated the wound out with bacitracin solution. We inspected the thecal sac and nerve roots and noted they were well decompressed. I placed DuraSeal over the durotomy site at L4-5. We then removed the retractor. We placed a medium Hemovac drain in the epidural space and tunneled out through separate stab wound. I placed vancomycin powder in the wound. We reapproximated patient's thoracolumbar fascia with interrupted #1 Vicryl suture.  We reapproximated patient's subcutaneous tissue with interrupted 2-0 Vicryl suture. The reapproximated patient's skin with Steri-Strips and benzoin. The wound was then coated with bacitracin ointment. A sterile dressing was applied. The drapes were removed. The patient was subsequently returned to the supine position where they were extubated by the anesthesia team. He was then transported to the post anesthesia care unit in stable condition. All sponge instrument and needle counts were reportedly correct at the end of this case.

## 2015-02-05 LAB — CBC
HEMATOCRIT: 31.8 % — AB (ref 36.0–46.0)
Hemoglobin: 10.4 g/dL — ABNORMAL LOW (ref 12.0–15.0)
MCH: 27.4 pg (ref 26.0–34.0)
MCHC: 32.7 g/dL (ref 30.0–36.0)
MCV: 83.7 fL (ref 78.0–100.0)
PLATELETS: 164 10*3/uL (ref 150–400)
RBC: 3.8 MIL/uL — ABNORMAL LOW (ref 3.87–5.11)
RDW: 14.7 % (ref 11.5–15.5)
WBC: 6.8 10*3/uL (ref 4.0–10.5)

## 2015-02-05 LAB — BASIC METABOLIC PANEL
ANION GAP: 9 (ref 5–15)
BUN: 11 mg/dL (ref 6–23)
CALCIUM: 8.1 mg/dL — AB (ref 8.4–10.5)
CO2: 24 mmol/L (ref 19–32)
CREATININE: 0.92 mg/dL (ref 0.50–1.10)
Chloride: 104 mmol/L (ref 96–112)
GFR, EST AFRICAN AMERICAN: 72 mL/min — AB (ref >90)
GFR, EST NON AFRICAN AMERICAN: 62 mL/min — AB (ref >90)
Glucose, Bld: 129 mg/dL — ABNORMAL HIGH (ref 70–99)
Potassium: 4.1 mmol/L (ref 3.5–5.1)
SODIUM: 137 mmol/L (ref 135–145)

## 2015-02-05 MED ORDER — DOCUSATE SODIUM 100 MG PO CAPS: 100.0000 mg | ORAL_CAPSULE | Freq: Two times a day (BID) | ORAL | Status: DC

## 2015-02-05 MED ORDER — OXYCODONE-ACETAMINOPHEN 5-325 MG PO TABS: 1.0000 | ORAL_TABLET | ORAL | Status: DC | PRN

## 2015-02-05 MED ORDER — DIAZEPAM 5 MG PO TABS: 5.0000 mg | ORAL_TABLET | Freq: Four times a day (QID) | ORAL | Status: DC | PRN

## 2015-02-05 MED FILL — Heparin Sodium (Porcine) Inj 1000 Unit/ML: INTRAMUSCULAR | Qty: 30 | Status: AC

## 2015-02-05 MED FILL — Sodium Chloride IV Soln 0.9%: INTRAVENOUS | Qty: 2000 | Status: AC

## 2015-02-05 NOTE — Progress Notes (Signed)
PT Cancellation Note  Patient Details Name: GALADRIEL SHROFF MRN: [id number] DOB: 09-Jul-1946   Cancelled Treatment:    Reason Eval/Treat Not Completed: PT screened, no needs identified, will sign off (OT reports no PT needs)   Shanna Cisco 02/05/2015, 10:47 AM

## 2015-02-05 NOTE — Discharge Summary (Signed)
Physician Discharge Summary  Patient ID: [name] MRN: [id number] DOB/AGE: 69/05/1946 69 y.o.  Admit date: 02/04/2015 Discharge date: 02/05/2015  Admission Diagnoses: L4-5 and L5-S1 degenerative disc disease, stenosis, lumbago, lumbar radiculopathy, neurogenic claudication  Discharge Diagnoses: The same Active Problems:   Lumbar stenosis with neurogenic claudication   Discharged Condition: good  [facility] performed an L4-5 and L5-S1 decompression, instrumentation, and fusion on the patient on 02/04/2015.  The patient's postoperative course was unremarkable. On postoperative day #1 the patient requested discharge to home. She was given oral and written discharge instructions. All her questions were answered. Consults:None Significant  Diagnostic Stnone Treatments: L4-5 and L5-S1 decompression, instrumentation, and fusion.  Discharge Exam: Blood pressure 116/54, pulse 74, temperature 99.2 F (37.3 C), temperature source Oral, resp. rate 20, height 5\' 4"  (1.626 m), weight 92.307 kg (203 lb 8 oz), SpO2 100 %.the patient is alert and pleasant. She looks great. She is in no apparent distress. Her dressing is clean and dry. She does not have a headache. Her strength is normal in her lower extremities.  Disposition: home  Discharge Instructions    Call MD for:  difficulty breathing, headache or visual disturbances    Complete by:  As directed      Call MD for:  extreme fatigue    Complete by:  As directed      Call MD for:  hives    Complete by:  As directed      Call MD for:  persistant dizziness or light-headedness    Complete by:  As directed      Call MD for:  persistant nausea and vomiting    Complete by:  As directed      Call MD for:  redness, tenderness, or signs of infection (pain, swelling, redness, odor or green/yellow discharge around incision site)    Complete by:  As directed      Call MD for:  severe uncontrolled pain    Complete by:  As directed      Call  MD for:  temperature >100.4    Complete by:  As directed      Diet - low sodium heart healthy    Complete by:  As directed      Discharge instructions    Complete by:  As directed   Call [phone] for a followup appointment. Take a stool softener while you are using pain medications.     Driving Restrictions    Complete by:  As directed   Do not drive for 2 weeks.     Increase activity slowly    Complete by:  As directed      Lifting restrictions    Complete by:  As directed   Do not lift more than 5 pounds. No excessive bending or twisting.     May shower / Bathe    Complete by:  As directed   He may shower after the pain she is removed 3 days after surgery. Leave the incision alone.     Remove dressing in 48 hours    Complete by:  As directed   Your stitches are under the scan and will dissolve by themselves. The Steri-Strips will fall off after you take a few showers. Do not rub back or pick at the wound, Leave the wound alone.            Medication List    STOP taking these medications        HYDROcodone-acetaminophen 5-325 MG  per tablet  Commonly known as:  NORCO/VICODIN     Tavaborole 5 % Soln  Commonly known as:  KERYDIN     traMADol 50 MG tablet  Commonly known as:  ULTRAM      TAKE these medications        diazepam 5 MG tablet  Commonly known as:  VALIUM  Take 1 tablet (5 mg total) by mouth every 6 (six) hours as needed for muscle spasms.     docusate sodium 100 MG capsule  Commonly known as:  COLACE  Take 1 capsule (100 mg total) by mouth 2 (two) times daily.     losartan 25 MG tablet  Commonly known as:  COZAAR  Take 25 mg by mouth daily.     oxyCODONE-acetaminophen 5-325 MG per tablet  Commonly known as:  PERCOCET/ROXICET  Take 1-2 tablets by mouth every 4 (four) hours as needed for moderate pain.         SignedOphelia Charter 02/05/2015, 7:49 AM

## 2015-02-05 NOTE — Progress Notes (Signed)
Pt doing well. Pt given D/C instructions with Rx's, verbal understanding was provided. Pt's Hemovac and IV were removed prior to D/C. Pt's incision is covered with gauze dressing and has no sign of infection. Pt D/C'd home via wheelchair @ 1050 per MD order. Pt is stable @ D/C and has no other needs at this time. Holli Humbles, RN

## 2015-02-05 NOTE — Progress Notes (Signed)
Utilization review completed.  

## 2015-02-05 NOTE — Evaluation (Signed)
Occupational Therapy Evaluation and Discharge Patient Details Name: [name] MRN: [id number] DOB: [birth year] Today's Date: 02/05/2015    History of Present Illness Bilateral L4 and L5 Laminotomy/foraminotomies and L4-5 fusion   Clinical Impression   This 69 yo female admitted and underwent above presents to acute OT with all education completed, we will D/C from acute OT.    Follow Up Recommendations  No OT follow up    Equipment Recommendations  None recommended by OT       Precautions / Restrictions Precautions Precautions: Back Precaution Booklet Issued: Yes (comment) Required Braces or Orthoses: Spinal Brace Spinal Brace: Lumbar corset;Applied in sitting position Restrictions Weight Bearing Restrictions: No      Mobility Bed Mobility               General bed mobility comments: Pt sitting on EOB upon my arrival. She reports that the nursing staff had shown her how to roll over and then sit up from her side  Transfers Overall transfer level: Modified independent Equipment used: Rolling walker (2 wheeled)  Pt ambulated 200 feet and went up and down 3 steps with rail without issues.           General transfer comment: sit to stand         ADL                                         General ADL Comments: Husband will A with LB ADLs prn, recommended that she use wet wipes for toileting hygiene for ease and to avoid twisitng as much, recommended that she use a spit cup and a rinse cup for brushing teeth so she does not bend over sink. She demonstrated sit<>stand from toilet               Pertinent Vitals/Pain Pain Assessment: 0-10 Pain Score: 3  Pain Location: back Pain Descriptors / Indicators: Aching;Sore Pain Intervention(s): Monitored during session;Patient requesting pain meds-RN notified;Repositioned     Hand Dominance Right   Extremity/Trunk Assessment Upper Extremity Assessment Upper Extremity Assessment:  Overall WFL for tasks assessed   Lower Extremity Assessment Lower Extremity Assessment: Overall WFL for tasks assessed       Communication Communication Communication: No difficulties   Cognition Arousal/Alertness: Awake/alert Behavior During Therapy: WFL for tasks assessed/performed Overall Cognitive Status: Within Functional Limits for tasks assessed                                Home Living Family/patient expects to be discharged to:: Private residence Living Arrangements: Spouse/significant other Available Help at Discharge: Family Type of Home: House Home Access: Stairs to enter Technical brewer of Steps: 3 Entrance Stairs-Rails: Left Home Layout: One level     Bathroom Shower/Tub: Occupational psychologist: Handicapped height     Home Equipment: None          Prior Functioning/Environment Level of Independence: Independent             OT Diagnosis: Generalized weakness;Acute pain         OT Goals(Current goals can be found in the care plan section) Acute Rehab OT Goals Patient Stated Goal: home today  OT Frequency:                End of Session Equipment Utilized  During Treatment: Rolling walker;Back brace Nurse Communication: Mobility status  Activity Tolerance: Patient tolerated treatment well Patient left: in bed;with call bell/phone within reach   Time: 0853-0916 OT Time Calculation (min): 23 min Charges:  OT General Charges $OT Visit: 1 Procedure OT Evaluation $Initial OT Evaluation Tier I: 1 Procedure OT Treatments $Self Care/Home Management : 8-22 mins  Almon Register [phone] 02/05/2015, 12:19 PM

## 2015-02-05 NOTE — Discharge Instructions (Signed)

## 2015-02-05 NOTE — Anesthesia Postprocedure Evaluation (Signed)
  Anesthesia Post-op Note  Patient: [name]  Procedure(s) Performed: Procedure(s) with comments: LUMBAR FOUR-FIVE, LUMBAR FIVE-SACRAL ONE POSTERIOR LUMBAR FUSION 2 LEVEL (N/A) - L45 L5S1 posterior lumbar interbody fusion with interbody prosthesis posterior lateral arthrodesis and posterior segmental instrumenation  Patient Location: PACU  Anesthesia Type:General  Level of Consciousness: awake, alert , oriented and patient cooperative  Airway and Oxygen Therapy: Patient Spontanous Breathing  Post-op Pain: mild  Post-op Assessment: Post-op Vital signs reviewed, Patient's Cardiovascular Status Stable, Respiratory Function Stable, Patent Airway, No signs of Nausea or vomiting and Pain level controlled  Post-op Vital Signs: stable  Last Vitals:  Filed Vitals:   02/05/15 0800  BP: 121/62  Pulse: 98  Temp: 36.9 C  Resp: 18    Complications: No apparent anesthesia complications

## 2015-03-05 DIAGNOSIS — M4806 Spinal stenosis, lumbar region: Secondary | ICD-10-CM | POA: Diagnosis not present

## 2015-04-23 DIAGNOSIS — I10 Essential (primary) hypertension: Secondary | ICD-10-CM | POA: Diagnosis not present

## 2015-04-23 DIAGNOSIS — E782 Mixed hyperlipidemia: Secondary | ICD-10-CM | POA: Diagnosis not present

## 2015-04-29 DIAGNOSIS — R7301 Impaired fasting glucose: Secondary | ICD-10-CM | POA: Diagnosis not present

## 2015-04-29 DIAGNOSIS — I10 Essential (primary) hypertension: Secondary | ICD-10-CM | POA: Diagnosis not present

## 2015-04-29 DIAGNOSIS — G589 Mononeuropathy, unspecified: Secondary | ICD-10-CM | POA: Diagnosis not present

## 2015-04-29 DIAGNOSIS — E785 Hyperlipidemia, unspecified: Secondary | ICD-10-CM | POA: Diagnosis not present

## 2015-04-29 DIAGNOSIS — M5410 Radiculopathy, site unspecified: Secondary | ICD-10-CM | POA: Diagnosis not present

## 2015-06-10 DIAGNOSIS — M545 Low back pain: Secondary | ICD-10-CM | POA: Diagnosis not present

## 2015-06-10 DIAGNOSIS — I10 Essential (primary) hypertension: Secondary | ICD-10-CM | POA: Diagnosis not present

## 2015-06-10 DIAGNOSIS — G589 Mononeuropathy, unspecified: Secondary | ICD-10-CM | POA: Diagnosis not present

## 2015-06-17 DIAGNOSIS — D239 Other benign neoplasm of skin, unspecified: Secondary | ICD-10-CM | POA: Diagnosis not present

## 2015-06-17 DIAGNOSIS — L570 Actinic keratosis: Secondary | ICD-10-CM | POA: Diagnosis not present

## 2015-06-21 DIAGNOSIS — M4316 Spondylolisthesis, lumbar region: Secondary | ICD-10-CM | POA: Diagnosis not present

## 2015-06-21 DIAGNOSIS — M545 Low back pain: Secondary | ICD-10-CM | POA: Diagnosis not present

## 2015-10-02 DIAGNOSIS — L570 Actinic keratosis: Secondary | ICD-10-CM | POA: Diagnosis not present

## 2015-10-21 DIAGNOSIS — R197 Diarrhea, unspecified: Secondary | ICD-10-CM | POA: Diagnosis not present

## 2015-10-21 DIAGNOSIS — R5383 Other fatigue: Secondary | ICD-10-CM | POA: Diagnosis not present

## 2015-10-21 DIAGNOSIS — R070 Pain in throat: Secondary | ICD-10-CM | POA: Diagnosis not present

## 2015-10-21 DIAGNOSIS — R509 Fever, unspecified: Secondary | ICD-10-CM | POA: Diagnosis not present

## 2015-10-21 DIAGNOSIS — R110 Nausea: Secondary | ICD-10-CM | POA: Diagnosis not present

## 2015-10-29 DIAGNOSIS — D1800 Hemangioma unspecified site: Secondary | ICD-10-CM | POA: Diagnosis not present

## 2015-10-29 DIAGNOSIS — L570 Actinic keratosis: Secondary | ICD-10-CM | POA: Diagnosis not present

## 2015-12-02 DIAGNOSIS — J019 Acute sinusitis, unspecified: Secondary | ICD-10-CM | POA: Diagnosis not present

## 2015-12-09 DIAGNOSIS — E782 Mixed hyperlipidemia: Secondary | ICD-10-CM | POA: Diagnosis not present

## 2015-12-09 DIAGNOSIS — R7301 Impaired fasting glucose: Secondary | ICD-10-CM | POA: Diagnosis not present

## 2015-12-09 DIAGNOSIS — I10 Essential (primary) hypertension: Secondary | ICD-10-CM | POA: Diagnosis not present

## 2016-04-08 DIAGNOSIS — M792 Neuralgia and neuritis, unspecified: Secondary | ICD-10-CM | POA: Diagnosis not present

## 2016-04-08 DIAGNOSIS — G589 Mononeuropathy, unspecified: Secondary | ICD-10-CM | POA: Diagnosis not present

## 2016-05-08 DIAGNOSIS — R7301 Impaired fasting glucose: Secondary | ICD-10-CM | POA: Diagnosis not present

## 2016-05-08 DIAGNOSIS — E782 Mixed hyperlipidemia: Secondary | ICD-10-CM | POA: Diagnosis not present

## 2016-05-08 DIAGNOSIS — D509 Iron deficiency anemia, unspecified: Secondary | ICD-10-CM | POA: Diagnosis not present

## 2016-05-13 DIAGNOSIS — R7301 Impaired fasting glucose: Secondary | ICD-10-CM | POA: Diagnosis not present

## 2016-05-13 DIAGNOSIS — I10 Essential (primary) hypertension: Secondary | ICD-10-CM | POA: Diagnosis not present

## 2016-05-13 DIAGNOSIS — E782 Mixed hyperlipidemia: Secondary | ICD-10-CM | POA: Diagnosis not present

## 2016-05-13 DIAGNOSIS — D509 Iron deficiency anemia, unspecified: Secondary | ICD-10-CM | POA: Diagnosis not present

## 2016-05-13 DIAGNOSIS — G589 Mononeuropathy, unspecified: Secondary | ICD-10-CM | POA: Diagnosis not present

## 2016-05-20 ENCOUNTER — Other Ambulatory Visit: Payer: Self-pay | Admitting: Internal Medicine

## 2016-05-20 DIAGNOSIS — Z1231 Encounter for screening mammogram for malignant neoplasm of breast: Secondary | ICD-10-CM

## 2016-05-26 DIAGNOSIS — R030 Elevated blood-pressure reading, without diagnosis of hypertension: Secondary | ICD-10-CM | POA: Diagnosis not present

## 2016-05-26 DIAGNOSIS — M5416 Radiculopathy, lumbar region: Secondary | ICD-10-CM | POA: Diagnosis not present

## 2016-05-29 DIAGNOSIS — M5416 Radiculopathy, lumbar region: Secondary | ICD-10-CM | POA: Diagnosis not present

## 2016-05-29 DIAGNOSIS — M4806 Spinal stenosis, lumbar region: Secondary | ICD-10-CM | POA: Diagnosis not present

## 2016-06-01 ENCOUNTER — Ambulatory Visit
Admission: RE | Admit: 2016-06-01 | Discharge: 2016-06-01 | Disposition: A | Discharge status: Home / Self Care | Payer: Medicare Other | Source: Ambulatory Visit | Attending: Internal Medicine | Admitting: Internal Medicine

## 2016-06-01 DIAGNOSIS — Z1231 Encounter for screening mammogram for malignant neoplasm of breast: Secondary | ICD-10-CM

## 2016-06-02 DIAGNOSIS — M5416 Radiculopathy, lumbar region: Secondary | ICD-10-CM | POA: Diagnosis not present

## 2016-06-02 DIAGNOSIS — M4806 Spinal stenosis, lumbar region: Secondary | ICD-10-CM | POA: Diagnosis not present

## 2016-06-02 DIAGNOSIS — R030 Elevated blood-pressure reading, without diagnosis of hypertension: Secondary | ICD-10-CM | POA: Diagnosis not present

## 2016-06-08 ENCOUNTER — Other Ambulatory Visit: Payer: Self-pay | Admitting: Neurosurgery

## 2016-06-16 HISTORY — PX: BACK SURGERY: SHX140

## 2016-06-27 ENCOUNTER — Encounter (HOSPITAL_COMMUNITY): Payer: Self-pay

## 2016-06-29 ENCOUNTER — Encounter (HOSPITAL_COMMUNITY)
Admission: RE | Admit: 2016-06-29 | Discharge: 2016-06-29 | Disposition: A | Discharge status: Home / Self Care | Payer: Medicare Other | Source: Ambulatory Visit | Attending: Neurosurgery | Admitting: Neurosurgery

## 2016-06-29 ENCOUNTER — Other Ambulatory Visit: Payer: Self-pay

## 2016-06-29 ENCOUNTER — Encounter (HOSPITAL_COMMUNITY): Payer: Self-pay

## 2016-06-29 DIAGNOSIS — Z01812 Encounter for preprocedural laboratory examination: Secondary | ICD-10-CM | POA: Diagnosis not present

## 2016-06-29 DIAGNOSIS — Z01818 Encounter for other preprocedural examination: Secondary | ICD-10-CM | POA: Insufficient documentation

## 2016-06-29 DIAGNOSIS — Z0183 Encounter for blood typing: Secondary | ICD-10-CM | POA: Insufficient documentation

## 2016-06-29 DIAGNOSIS — M4806 Spinal stenosis, lumbar region: Secondary | ICD-10-CM | POA: Diagnosis not present

## 2016-06-29 LAB — BASIC METABOLIC PANEL
ANION GAP: 9 (ref 5–15)
BUN: 15 mg/dL (ref 6–20)
CO2: 26 mmol/L (ref 22–32)
Calcium: 9.4 mg/dL (ref 8.9–10.3)
Chloride: 104 mmol/L (ref 101–111)
Creatinine, Ser: 0.85 mg/dL (ref 0.44–1.00)
GFR calc Af Amer: >60 mL/min (ref >60)
GLUCOSE: 108 mg/dL — AB (ref 65–99)
POTASSIUM: 4.2 mmol/L (ref 3.5–5.1)
Sodium: 139 mmol/L (ref 135–145)

## 2016-06-29 LAB — CBC
HCT: 40.4 % (ref 36.0–46.0)
HEMOGLOBIN: 12.5 g/dL (ref 12.0–15.0)
MCH: 26.7 pg (ref 26.0–34.0)
MCHC: 30.9 g/dL (ref 30.0–36.0)
MCV: 86.1 fL (ref 78.0–100.0)
Platelets: 220 10*3/uL (ref 150–400)
RBC: 4.69 MIL/uL (ref 3.87–5.11)
RDW: 15.1 % (ref 11.5–15.5)
WBC: 5.3 10*3/uL (ref 4.0–10.5)

## 2016-06-29 LAB — TYPE AND SCREEN
ABO/RH(D): O POS
Antibody Screen: NEGATIVE

## 2016-06-29 LAB — SURGICAL PCR SCREEN
MRSA, PCR: NEGATIVE
STAPHYLOCOCCUS AUREUS: POSITIVE — AB

## 2016-06-29 NOTE — Progress Notes (Signed)
Message left for patient regarding results of PCR screen. Rx. For mupirocin called to [facility] in [place].

## 2016-06-29 NOTE — Pre-Procedure Instructions (Signed)
    [name]  06/29/2016      [facility], [place] [address] Phone: [phone] Fax: [place], [place] [address] Phone: [phone] Fax: [phone]    Your procedure is scheduled on 07-09-2016    Thursday   Report to [facility] Admitting at 11:30 AM    Call this number if you have problems the morning of surgery:  [phone]   Remember:  Do not eat food or drink liquids after midnight .  Take these medicines the morning of surgery with A SIP OF WATER  none               STOP ASPIRIN,ANTIINFLAMATORIES (IBUPROFEN,ALEVE,MOTRIN,ADVIL,GOODY'S POWDERS),HERBAL SUPPLEMENTS,FISH OIL,AND VITAMINS 5-7 DAYS PRIOR TO SURGERY   Do not wear jewelry, make-up or nail polish.  Do not wear lotions, powders, or perfumes.  You may NOT wear deoderant.  Do not shave 48 hours prior to surgery.     Do not bring valuables to the hospital.  [facility] is not responsible for any belongings or valuables.  Contacts, dentures or bridgework may not be worn into surgery.  Leave your suitcase in the car.  After surgery it may be brought to your room.  For patients admitted to the hospital, discharge time will be determined by your treatment team.  Patients discharged the day of surgery will not be allowed to drive home.    Special instructions:  See Attached Sheet for instructions on CHG shower  Please read over the following fact sheets that you were given. MRSA Information and Surgical Site Infection Prevention

## 2016-06-30 DIAGNOSIS — H52223 Regular astigmatism, bilateral: Secondary | ICD-10-CM | POA: Diagnosis not present

## 2016-06-30 DIAGNOSIS — H25813 Combined forms of age-related cataract, bilateral: Secondary | ICD-10-CM | POA: Diagnosis not present

## 2016-06-30 DIAGNOSIS — H524 Presbyopia: Secondary | ICD-10-CM | POA: Diagnosis not present

## 2016-06-30 DIAGNOSIS — H5213 Myopia, bilateral: Secondary | ICD-10-CM | POA: Diagnosis not present

## 2016-07-09 ENCOUNTER — Inpatient Hospital Stay (HOSPITAL_COMMUNITY): Payer: Medicare Other

## 2016-07-09 ENCOUNTER — Encounter (HOSPITAL_COMMUNITY): Payer: Self-pay | Admitting: Urology

## 2016-07-09 ENCOUNTER — Inpatient Hospital Stay (HOSPITAL_COMMUNITY)
Admission: RE | Disposition: A | Discharge status: Home / Self Care | Payer: Self-pay | Source: Ambulatory Visit | Attending: Neurosurgery

## 2016-07-09 ENCOUNTER — Inpatient Hospital Stay (HOSPITAL_COMMUNITY)
Admission: RE | Admit: 2016-07-09 | Discharge: 2016-07-10 | DRG: 460 | Disposition: A | Discharge status: Home / Self Care | Payer: Medicare Other | Source: Ambulatory Visit | Attending: Neurosurgery | Admitting: Neurosurgery

## 2016-07-09 ENCOUNTER — Inpatient Hospital Stay (HOSPITAL_COMMUNITY): Payer: Medicare Other | Admitting: Certified Registered Nurse Anesthetist

## 2016-07-09 DIAGNOSIS — M4326 Fusion of spine, lumbar region: Secondary | ICD-10-CM | POA: Diagnosis not present

## 2016-07-09 DIAGNOSIS — M5116 Intervertebral disc disorders with radiculopathy, lumbar region: Secondary | ICD-10-CM | POA: Diagnosis not present

## 2016-07-09 DIAGNOSIS — M545 Low back pain: Secondary | ICD-10-CM | POA: Diagnosis present

## 2016-07-09 DIAGNOSIS — M48062 Spinal stenosis, lumbar region with neurogenic claudication: Secondary | ICD-10-CM | POA: Diagnosis present

## 2016-07-09 DIAGNOSIS — Z419 Encounter for procedure for purposes other than remedying health state, unspecified: Secondary | ICD-10-CM

## 2016-07-09 DIAGNOSIS — Z981 Arthrodesis status: Secondary | ICD-10-CM | POA: Diagnosis not present

## 2016-07-09 DIAGNOSIS — M5136 Other intervertebral disc degeneration, lumbar region: Secondary | ICD-10-CM | POA: Diagnosis not present

## 2016-07-09 DIAGNOSIS — M4806 Spinal stenosis, lumbar region: Secondary | ICD-10-CM | POA: Diagnosis present

## 2016-07-09 DIAGNOSIS — Z6835 Body mass index (BMI) 35.0-35.9, adult: Secondary | ICD-10-CM | POA: Diagnosis not present

## 2016-07-09 DIAGNOSIS — I10 Essential (primary) hypertension: Secondary | ICD-10-CM | POA: Diagnosis present

## 2016-07-09 SURGERY — POSTERIOR LUMBAR FUSION 1 LEVEL
Anesthesia: General

## 2016-07-09 MED ORDER — SODIUM CHLORIDE 0.9 % IR SOLN
Status: DC | PRN
  Administered 2016-07-09: 500 mL

## 2016-07-09 MED ORDER — SUGAMMADEX SODIUM 200 MG/2ML IV SOLN
INTRAVENOUS | Status: AC
  Filled 2016-07-09: qty 2

## 2016-07-09 MED ORDER — BUPIVACAINE LIPOSOME 1.3 % IJ SUSP
20.0000 mL | Freq: Once | INTRAMUSCULAR | Status: DC
  Filled 2016-07-09: qty 20

## 2016-07-09 MED ORDER — CEFAZOLIN SODIUM-DEXTROSE 2-4 GM/100ML-% IV SOLN
2.0000 g | Freq: Three times a day (TID) | INTRAVENOUS | Status: AC
  Administered 2016-07-09 – 2016-07-10 (×2): 2 g via INTRAVENOUS
  Filled 2016-07-09 (×2): qty 100

## 2016-07-09 MED ORDER — BACITRACIN ZINC 500 UNIT/GM EX OINT
TOPICAL_OINTMENT | CUTANEOUS | Status: DC | PRN
  Administered 2016-07-09: 1 via TOPICAL

## 2016-07-09 MED ORDER — MEPERIDINE HCL 25 MG/ML IJ SOLN: 6.2500 mg | INTRAMUSCULAR | Status: DC | PRN

## 2016-07-09 MED ORDER — VANCOMYCIN HCL 1000 MG IV SOLR
INTRAVENOUS | Status: DC | PRN
  Administered 2016-07-09: 1000 mg via TOPICAL

## 2016-07-09 MED ORDER — HYDROMORPHONE HCL 1 MG/ML IJ SOLN
INTRAMUSCULAR | Status: AC
  Filled 2016-07-09: qty 1

## 2016-07-09 MED ORDER — PHENOL 1.4 % MT LIQD
1.0000 | OROMUCOSAL | Status: DC | PRN
Start: 2016-07-09 — End: 2016-07-10

## 2016-07-09 MED ORDER — HYDROCODONE-ACETAMINOPHEN 5-325 MG PO TABS: 1.0000 | ORAL_TABLET | ORAL | Status: DC | PRN

## 2016-07-09 MED ORDER — FENTANYL CITRATE (PF) 100 MCG/2ML IJ SOLN
INTRAMUSCULAR | Status: AC
  Filled 2016-07-09: qty 2

## 2016-07-09 MED ORDER — ALUM & MAG HYDROXIDE-SIMETH 200-200-20 MG/5ML PO SUSP: 30.0000 mL | Freq: Four times a day (QID) | ORAL | Status: DC | PRN

## 2016-07-09 MED ORDER — MORPHINE SULFATE (PF) 2 MG/ML IV SOLN: 1.0000 mg | INTRAVENOUS | Status: DC | PRN

## 2016-07-09 MED ORDER — EPHEDRINE 5 MG/ML INJ
INTRAVENOUS | Status: AC
  Filled 2016-07-09: qty 10

## 2016-07-09 MED ORDER — VANCOMYCIN HCL 1000 MG IV SOLR
INTRAVENOUS | Status: AC
  Filled 2016-07-09: qty 1000

## 2016-07-09 MED ORDER — THROMBIN 20000 UNITS EX SOLR
CUTANEOUS | Status: DC | PRN
  Administered 2016-07-09: 20 mL via TOPICAL

## 2016-07-09 MED ORDER — 0.9 % SODIUM CHLORIDE (POUR BTL) OPTIME
TOPICAL | Status: DC | PRN
  Administered 2016-07-09: 1000 mL

## 2016-07-09 MED ORDER — CEFAZOLIN SODIUM-DEXTROSE 2-4 GM/100ML-% IV SOLN
2.0000 g | INTRAVENOUS | Status: AC
  Administered 2016-07-09: 2 g via INTRAVENOUS

## 2016-07-09 MED ORDER — CHLORHEXIDINE GLUCONATE CLOTH 2 % EX PADS: 6.0000 | MEDICATED_PAD | Freq: Once | CUTANEOUS | Status: DC

## 2016-07-09 MED ORDER — LIDOCAINE HCL (CARDIAC) 20 MG/ML IV SOLN
INTRAVENOUS | Status: DC | PRN
  Administered 2016-07-09: 20 mg via INTRAVENOUS

## 2016-07-09 MED ORDER — ONDANSETRON HCL 4 MG/2ML IJ SOLN: 4.0000 mg | INTRAMUSCULAR | Status: DC | PRN

## 2016-07-09 MED ORDER — HYDROMORPHONE HCL 1 MG/ML IJ SOLN
0.2500 mg | INTRAMUSCULAR | Status: DC | PRN
  Administered 2016-07-09: 0.5 mg via INTRAVENOUS

## 2016-07-09 MED ORDER — PROMETHAZINE HCL 25 MG/ML IJ SOLN: 6.2500 mg | INTRAMUSCULAR | Status: DC | PRN

## 2016-07-09 MED ORDER — DIAZEPAM 5 MG PO TABS
5.0000 mg | ORAL_TABLET | Freq: Four times a day (QID) | ORAL | Status: DC | PRN
  Administered 2016-07-09: 5 mg via ORAL
  Filled 2016-07-09: qty 1

## 2016-07-09 MED ORDER — LACTATED RINGERS IV SOLN: INTRAVENOUS | Status: DC

## 2016-07-09 MED ORDER — CEFAZOLIN SODIUM-DEXTROSE 2-4 GM/100ML-% IV SOLN
INTRAVENOUS | Status: AC
  Filled 2016-07-09: qty 100

## 2016-07-09 MED ORDER — BUPIVACAINE-EPINEPHRINE (PF) 0.5% -1:200000 IJ SOLN
INTRAMUSCULAR | Status: DC | PRN
  Administered 2016-07-09: 10 mL

## 2016-07-09 MED ORDER — BISACODYL 10 MG RE SUPP: 10.0000 mg | Freq: Every day | RECTAL | Status: DC | PRN

## 2016-07-09 MED ORDER — PROPOFOL 10 MG/ML IV BOLUS
INTRAVENOUS | Status: AC
  Filled 2016-07-09: qty 20

## 2016-07-09 MED ORDER — LACTATED RINGERS IV SOLN
INTRAVENOUS | Status: DC
  Administered 2016-07-09 (×2): via INTRAVENOUS

## 2016-07-09 MED ORDER — DOCUSATE SODIUM 100 MG PO CAPS
100.0000 mg | ORAL_CAPSULE | Freq: Two times a day (BID) | ORAL | Status: DC
  Administered 2016-07-09: 100 mg via ORAL
  Filled 2016-07-09: qty 1

## 2016-07-09 MED ORDER — DEXAMETHASONE SODIUM PHOSPHATE 10 MG/ML IJ SOLN
INTRAMUSCULAR | Status: DC | PRN
  Administered 2016-07-09: 10 mg via INTRAVENOUS

## 2016-07-09 MED ORDER — PHENYLEPHRINE 40 MCG/ML (10ML) SYRINGE FOR IV PUSH (FOR BLOOD PRESSURE SUPPORT)
PREFILLED_SYRINGE | INTRAVENOUS | Status: AC
  Filled 2016-07-09: qty 10

## 2016-07-09 MED ORDER — ACETAMINOPHEN 325 MG PO TABS: 650.0000 mg | ORAL_TABLET | ORAL | Status: DC | PRN

## 2016-07-09 MED ORDER — PROPOFOL 10 MG/ML IV BOLUS
INTRAVENOUS | Status: DC | PRN
  Administered 2016-07-09: 200 mg via INTRAVENOUS

## 2016-07-09 MED ORDER — BUPIVACAINE LIPOSOME 1.3 % IJ SUSP
INTRAMUSCULAR | Status: DC | PRN
  Administered 2016-07-09: 20 mL

## 2016-07-09 MED ORDER — ONDANSETRON HCL 4 MG/2ML IJ SOLN
INTRAMUSCULAR | Status: DC | PRN
  Administered 2016-07-09: 4 mg via INTRAVENOUS

## 2016-07-09 MED ORDER — ROCURONIUM BROMIDE 100 MG/10ML IV SOLN
INTRAVENOUS | Status: DC | PRN
  Administered 2016-07-09: 50 mg via INTRAVENOUS
  Administered 2016-07-09 (×2): 10 mg via INTRAVENOUS

## 2016-07-09 MED ORDER — MIDAZOLAM HCL 2 MG/2ML IJ SOLN: 0.5000 mg | Freq: Once | INTRAMUSCULAR | Status: DC | PRN

## 2016-07-09 MED ORDER — MENTHOL 3 MG MT LOZG: 1.0000 | LOZENGE | OROMUCOSAL | Status: DC | PRN

## 2016-07-09 MED ORDER — FENTANYL CITRATE (PF) 100 MCG/2ML IJ SOLN
INTRAMUSCULAR | Status: DC | PRN
  Administered 2016-07-09 (×4): 100 ug via INTRAVENOUS

## 2016-07-09 MED ORDER — ACETAMINOPHEN 650 MG RE SUPP: 650.0000 mg | RECTAL | Status: DC | PRN

## 2016-07-09 MED ORDER — OXYCODONE-ACETAMINOPHEN 5-325 MG PO TABS
1.0000 | ORAL_TABLET | ORAL | Status: DC | PRN
  Administered 2016-07-09: 2 via ORAL
  Administered 2016-07-10 (×2): 1 via ORAL
  Filled 2016-07-09 (×2): qty 2
  Filled 2016-07-09: qty 1

## 2016-07-09 MED ORDER — ONDANSETRON HCL 4 MG/2ML IJ SOLN
INTRAMUSCULAR | Status: AC
  Filled 2016-07-09: qty 2

## 2016-07-09 MED ORDER — SUGAMMADEX SODIUM 200 MG/2ML IV SOLN
INTRAVENOUS | Status: DC | PRN
  Administered 2016-07-09: 186 mg via INTRAVENOUS

## 2016-07-09 SURGICAL SUPPLY — 68 items
APL SKNCLS STERI-STRIP NONHPOA (GAUZE/BANDAGES/DRESSINGS) ×1
BAG DECANTER FOR FLEXI CONT (MISCELLANEOUS) ×2 IMPLANT
BENZOIN TINCTURE PRP APPL 2/3 (GAUZE/BANDAGES/DRESSINGS) ×2 IMPLANT
BLADE CLIPPER SURG (BLADE) IMPLANT
BRUSH SCRUB EZ PLAIN DRY (MISCELLANEOUS) ×2 IMPLANT
BUR MATCHSTICK NEURO 3.0 LAGG (BURR) ×3 IMPLANT
BUR PRECISION FLUTE 6.0 (BURR) ×2 IMPLANT
CANISTER SUCT 3000ML PPV (MISCELLANEOUS) ×2 IMPLANT
CAP REVERE LOCKING (Cap) ×8 IMPLANT
CONT SPEC 4OZ CLIKSEAL STRL BL (MISCELLANEOUS) ×2 IMPLANT
CORDS BIPOLAR (ELECTRODE) ×1 IMPLANT
COVER BACK TABLE 60X90IN (DRAPES) ×2 IMPLANT
COVER TABLE BACK 60X90 (DRAPES) ×2 IMPLANT
DRAPE C-ARM 42X72 X-RAY (DRAPES) ×4 IMPLANT
DRAPE HALF SHEET 40X57 (DRAPES) ×2 IMPLANT
DRAPE LAPAROTOMY 100X72X124 (DRAPES) ×2 IMPLANT
DRAPE POUCH INSTRU U-SHP 10X18 (DRAPES) ×2 IMPLANT
DRAPE SURG 17X23 STRL (DRAPES) ×8 IMPLANT
ELECT BLADE 4.0 EZ CLEAN MEGAD (MISCELLANEOUS) ×2
ELECT REM PT RETURN 9FT ADLT (ELECTROSURGICAL) ×2
ELECTRODE BLDE 4.0 EZ CLN MEGD (MISCELLANEOUS) ×1 IMPLANT
ELECTRODE REM PT RTRN 9FT ADLT (ELECTROSURGICAL) ×1 IMPLANT
EVACUATOR 1/8 PVC DRAIN (DRAIN) IMPLANT
GAUZE SPONGE 4X4 12PLY STRL (GAUZE/BANDAGES/DRESSINGS) ×2 IMPLANT
GAUZE SPONGE 4X4 16PLY XRAY LF (GAUZE/BANDAGES/DRESSINGS) ×2 IMPLANT
GLOVE BIO SURGEON STRL SZ 6.5 (GLOVE) ×3 IMPLANT
GLOVE BIO SURGEON STRL SZ8 (GLOVE) ×4 IMPLANT
GLOVE BIO SURGEON STRL SZ8.5 (GLOVE) ×4 IMPLANT
GLOVE BIOGEL PI IND STRL 7.5 (GLOVE) IMPLANT
GLOVE BIOGEL PI INDICATOR 7.5 (GLOVE) ×2
GLOVE ECLIPSE 6.5 STRL STRAW (GLOVE) ×1 IMPLANT
GLOVE EXAM NITRILE LRG STRL (GLOVE) IMPLANT
GLOVE EXAM NITRILE MD LF STRL (GLOVE) IMPLANT
GLOVE EXAM NITRILE XL STR (GLOVE) IMPLANT
GLOVE EXAM NITRILE XS STR PU (GLOVE) IMPLANT
GOWN STRL REUS W/ TWL LRG LVL3 (GOWN DISPOSABLE) IMPLANT
GOWN STRL REUS W/ TWL XL LVL3 (GOWN DISPOSABLE) ×2 IMPLANT
GOWN STRL REUS W/TWL 2XL LVL3 (GOWN DISPOSABLE) IMPLANT
GOWN STRL REUS W/TWL LRG LVL3 (GOWN DISPOSABLE) ×2
GOWN STRL REUS W/TWL XL LVL3 (GOWN DISPOSABLE) ×4
KIT BASIN OR (CUSTOM PROCEDURE TRAY) ×2 IMPLANT
KIT ROOM TURNOVER OR (KITS) ×2 IMPLANT
NDL HYPO 21X1.5 SAFETY (NEEDLE) IMPLANT
NEEDLE HYPO 21X1.5 SAFETY (NEEDLE) IMPLANT
NEEDLE HYPO 22GX1.5 SAFETY (NEEDLE) ×2 IMPLANT
NS IRRIG 1000ML POUR BTL (IV SOLUTION) ×2 IMPLANT
PACK LAMINECTOMY NEURO (CUSTOM PROCEDURE TRAY) ×2 IMPLANT
PAD ARMBOARD 7.5X6 YLW CONV (MISCELLANEOUS) ×6 IMPLANT
PATTIES SURGICAL .5 X1 (DISPOSABLE) IMPLANT
ROD CURVED REVERE 6.35 95MM (Rod) ×1 IMPLANT
ROD REVERE 6.35 CURVED 85MM (Rod) ×1 IMPLANT
SCREW REVERE 6.5X50MM (Screw) ×2 IMPLANT
SPACER ALTERA 10X31 8-12MM-8 (Spacer) ×1 IMPLANT
SPONGE LAP 4X18 X RAY DECT (DISPOSABLE) IMPLANT
SPONGE NEURO XRAY DETECT 1X3 (DISPOSABLE) IMPLANT
SPONGE SURGIFOAM ABS GEL 100 (HEMOSTASIS) ×2 IMPLANT
STRIP BIOACTIVE 10CC 25X50X8 (Miscellaneous) ×2 IMPLANT
STRIP BIOACTIVE 5CC 25X50X4MM (Miscellaneous) ×1 IMPLANT
STRIP CLOSURE SKIN 1/2X4 (GAUZE/BANDAGES/DRESSINGS) ×2 IMPLANT
SUT VIC AB 1 CT1 18XBRD ANBCTR (SUTURE) ×2 IMPLANT
SUT VIC AB 1 CT1 8-18 (SUTURE) ×4
SUT VIC AB 2-0 CP2 18 (SUTURE) ×4 IMPLANT
SYRINGE 10CC LL (SYRINGE) ×1 IMPLANT
TAPE CLOTH SURG 4X10 WHT LF (GAUZE/BANDAGES/DRESSINGS) ×1 IMPLANT
TOWEL OR 17X24 6PK STRL BLUE (TOWEL DISPOSABLE) ×2 IMPLANT
TOWEL OR 17X26 10 PK STRL BLUE (TOWEL DISPOSABLE) ×2 IMPLANT
TRAY FOLEY W/METER SILVER 16FR (SET/KITS/TRAYS/PACK) ×2 IMPLANT
WATER STERILE IRR 1000ML POUR (IV SOLUTION) ×2 IMPLANT

## 2016-07-09 NOTE — Transfer of Care (Signed)
Immediate Anesthesia Transfer of Care Note  Patient: [name]  Procedure(s) Performed: Procedure(s) with comments: lumbar three-four  Exploration/extension of fusion/Interbody prosthesis/posterior segmental instrumentation/posterolateral arthrodesis (N/A) - L3-4 Exploration/extension of fusion/Interbody prosthesis/posterior segmental instrumentation/posterolateral arthrodesis  Patient Location: PACU  Anesthesia Type:General  Level of Consciousness: awake and alert   Airway & Oxygen Therapy: Patient Spontanous Breathing and Patient connected to nasal cannula oxygen  Post-op Assessment: Report given to RN and Post -op Vital signs reviewed and stable  Post vital signs: Reviewed and stable  Last Vitals:  Vitals:   07/09/16 1158 07/09/16 1803  BP: (!) 157/76   Pulse:    Resp:    Temp:  36.6 C    Last Pain:  Vitals:   07/09/16 1157  TempSrc:   PainSc: 4          Complications: No apparent anesthesia complications

## 2016-07-09 NOTE — Anesthesia Preprocedure Evaluation (Signed)
Anesthesia Evaluation  Patient identified by MRN, date of birth, ID band Patient awake    Reviewed: Allergy & Precautions, NPO status , Patient's Chart, lab work & pertinent test results  History of Anesthesia Complications Negative for: history of anesthetic complications  Airway Mallampati: I  TM Distance: >3 FB Neck ROM: Full    Dental  (+) Edentulous Upper, Edentulous Lower   Pulmonary neg pulmonary ROS,    breath sounds clear to auscultation       Cardiovascular hypertension (does not need meds), (-) angina Rhythm:Regular Rate:Normal     Neuro/Psych Back pain: tylenol    GI/Hepatic negative GI ROS, Neg liver ROS,   Endo/Other  Morbid obesity  Renal/GU negative Renal ROS     Musculoskeletal  (+) Arthritis , Osteoarthritis,    Abdominal (+) + obese,   Peds  Hematology negative hematology ROS (+)   Anesthesia Other Findings   Reproductive/Obstetrics                             Anesthesia Physical Anesthesia Plan  ASA: II  Anesthesia Plan: General   Post-op Pain Management:    Induction: Intravenous  Airway Management Planned: Oral ETT  Additional Equipment:   Intra-op Plan:   Post-operative Plan: Extubation in OR  Informed Consent: I have reviewed the patients History and Physical, chart, labs and discussed the procedure including the risks, benefits and alternatives for the proposed anesthesia with the patient or authorized representative who has indicated his/her understanding and acceptance.     Plan Discussed with: CRNA and Surgeon  Anesthesia Plan Comments: (Plan routine monitors, GETA)        Anesthesia Quick Evaluation

## 2016-07-09 NOTE — H&P (Signed)
Subjective: The patient is a 70 year old white female on whom I performed an L4-5 decompression, instrumentation, and fusion. She initially did well but has developed recurrent back, buttock, and leg pain consistent with neurogenic claudication. She has failed medical management and was worked up with a lumbar MRI. This demonstrated spinal stenosis at L3-4. Discussed the various treatment options with the patient. She has decided to proceed with surgery.   Past Medical History:  Diagnosis Date  . Arthritis   . Hypertension   . PONV (postoperative nausea and vomiting)     Past Surgical History:  Procedure Laterality Date  . ABDOMINAL HYSTERECTOMY  82  . BACK SURGERY  2010  . FRACTURE SURGERY Right    arm  1974    Allergies  Allergen Reactions  . No Known Allergies     Social History  Substance Use Topics  . Smoking status: Never Smoker  . Smokeless tobacco: Never Used  . Alcohol use No    Family History  Problem Relation Age of Onset  . Hypertension Mother    Prior to Admission medications   Medication Sig Start Date End Date Taking? Authorizing Provider  acetaminophen (TYLENOL) 325 MG tablet Take 650 mg by mouth every 6 (six) hours as needed for moderate pain or headache.   Yes Historical Provider, MD     Review of Systems  Positive ROS: As above  All other systems have been reviewed and were otherwise negative with the exception of those mentioned in the HPI and as above.  Objective: Vital signs in last 24 hours: Temp:  [98 F (36.7 C)] 98 F (36.7 C) (08/24 1128) Pulse Rate:  [77] 77 (08/24 1128) Resp:  [20] 20 (08/24 1128) BP: (157-172)/(76-78) 157/76 (08/24 1158) SpO2:  [99 %] 99 % (08/24 1128) Weight:  [93 kg (205 lb)] 93 kg (205 lb) (08/24 1128)  General Appearance: Alert, cooperative, no distress, Head: Normocephalic, without obvious abnormality, atraumatic Eyes: PERRL, conjunctiva/corneas clear, EOM's intact,    Ears: Normal  Throat: Normal  Neck:  Supple, symmetrical, trachea midline, no adenopathy; thyroid: No enlargement/tenderness/nodules; no carotid bruit or JVD Back: Symmetric, no curvature, ROM normal, no CVA tenderness. Her lumbar incision is well-healed. Lungs: Clear to auscultation bilaterally, respirations unlabored Heart: Regular rate and rhythm, no murmur, rub or gallop Abdomen: Soft, non-tender,, no masses, no organomegaly Extremities: Extremities normal, atraumatic, no cyanosis or edema Pulses: 2+ and symmetric all extremities Skin: Skin color, texture, turgor normal, no rashes or lesions  NEUROLOGIC:   Mental status: alert and oriented, no aphasia, good attention span, Fund of knowledge/ memory ok Motor Exam - grossly normal Sensory Exam - grossly normal Reflexes:  Coordination - grossly normal Gait - grossly normal Balance - grossly normal Cranial Nerves: I: smell Not tested  II: visual acuity  OS: Normal  OD: Normal   II: visual fields Full to confrontation  II: pupils Equal, round, reactive to light  III,VII: ptosis None  III,IV,VI: extraocular muscles  Full ROM  V: mastication Normal  V: facial light touch sensation  Normal  V,VII: corneal reflex  Present  VII: facial muscle function - upper  Normal  VII: facial muscle function - lower Normal  VIII: hearing Not tested  IX: soft palate elevation  Normal  IX,X: gag reflex Present  XI: trapezius strength  5/5  XI: sternocleidomastoid strength 5/5  XI: neck flexion strength  5/5  XII: tongue strength  Normal    Data Review Lab Results  Component Value Date  WBC 5.3 06/29/2016   HGB 12.5 06/29/2016   HCT 40.4 06/29/2016   MCV 86.1 06/29/2016   PLT 220 06/29/2016   Lab Results  Component Value Date   NA 139 06/29/2016   K 4.2 06/29/2016   CL 104 06/29/2016   CO2 26 06/29/2016   BUN 15 06/29/2016   CREATININE 0.85 06/29/2016   GLUCOSE 108 (H) 06/29/2016   Lab Results  Component Value Date   INR 1.00 01/27/2011     Assessment/Plan: L3-4 spinal stenosis, lumbago, lumbar radiculopathy, neurogenic claudication: I have discussed situation with the patient and reviewed her imaging studies with her. We have discussed the various treatment options including surgery. I have described the surgical treatment option of exploration of her fusion with an L3-4 decompression, instrumentation, and fusion. I have shown her surgical models. We have discussed the risks, benefits, alternatives, expected postoperative course, and likelihood of achieving her goals with surgery. I have answered all the patient's questions. She has decided to proceed with surgery.   Erdem Naas D 07/09/2016 1:59 PM

## 2016-07-09 NOTE — Progress Notes (Signed)
Subjective:  The patient is somnolent but easily arousable. She is in no apparent distress. She looks well.  Objective: Vital signs in last 24 hours: Temp:  [97.9 F (36.6 C)-98 F (36.7 C)] 97.9 F (36.6 C) (08/24 1803) Pulse Rate:  [77] 77 (08/24 1128) Resp:  [20] 20 (08/24 1128) BP: (157-172)/(76-78) 157/76 (08/24 1158) SpO2:  [99 %] 99 % (08/24 1128) Weight:  [93 kg (205 lb)] 93 kg (205 lb) (08/24 1128)  Intake/Output from previous day: No intake/output data recorded. Intake/Output this shift: Total I/O In: 2150 [I.V.:2150] Out: 540 [Urine:340; Blood:200]  Physical exam the patient is, but arousable. She is moving her lower extremities well.  Lab Results: No results for input(s): WBC, HGB, HCT, PLT in the last 72 hours. BMET No results for input(s): NA, K, CL, CO2, GLUCOSE, BUN, CREATININE, CALCIUM in the last 72 hours.  Studies/Results: No results found.  Assessment/Plan: The patient is doing well. I spoke with her family.  LOS: 0 days     Pola Furno D 07/09/2016, 6:07 PM

## 2016-07-09 NOTE — Anesthesia Procedure Notes (Signed)
Procedure Name: Intubation Date/Time: 07/09/2016 2:15 PM Performed by: Shirlyn Goltz Pre-anesthesia Checklist: Patient identified, Emergency Drugs available, Suction available and Patient being monitored Patient Re-evaluated:Patient Re-evaluated prior to inductionOxygen Delivery Method: Circle system utilized Preoxygenation: Pre-oxygenation with 100% oxygen Intubation Type: IV induction Ventilation: Mask ventilation without difficulty and Oral airway inserted - appropriate to patient size Laryngoscope Size: Mac and 3 Grade View: Grade I Tube type: Oral Tube size: 7.0 mm Number of attempts: 1 Airway Equipment and Method: Stylet Placement Confirmation: ETT inserted through vocal cords under direct vision,  positive ETCO2 and breath sounds checked- equal and bilateral Secured at: 22 cm Tube secured with: Tape Dental Injury: Teeth and Oropharynx as per pre-operative assessment

## 2016-07-09 NOTE — Op Note (Signed)
Brief history: The patient is a 69 year old white female on whom I performed a L4-5 and L5-S1 decompression, instrumentation, and fusion years ago. She initially did well but has developed recurrent back, buttock and leg pain consistent with neurogenic claudication. She has failed medical management and was worked up with a lumbar MRI which demonstrated severe stenosis at L3-4. I discussed the various treatment options. The patient has decided to proceed with surgery after weighing the risks, benefits, and alternatives.  Preoperative diagnosis: L3-4 Degenerative disc disease, spinal stenosis compressing both the L3 and the L4 nerve roots; lumbago; lumbar radiculopathy  Postoperative diagnosis: The same  Procedure: Bilateral L3-4 Laminotomy/foraminotomies to decompress the bilateral L3 and L4 nerve roots(the work required to do this was in addition to the work required to do the posterior lumbar interbody fusion because of the patient's spinal stenosis, facet arthropathy. Etc. requiring a wide decompression of the nerve roots.); L3-4 transforaminal lumbar interbody fusion with local morselized autograft bone and Kinnex graft extender; insertion of interbody prosthesis at L3-4 (globus peek expandable interbody prosthesis); posterior segmental instrumentation from L3 to S1 with globus titanium pedicle screws and rods; posterior lateral arthrodesis at L3-4 with local morselized autograft bone and Kinnex bone graft extender.  Surgeon: Dr. Earle Gell  Asst.: None  Anesthesia: Gen. endotracheal  Estimated blood loss: 200 mL  Drains: None  Complications: None  Description of procedure: The patient was brought to the operating room by the anesthesia team. General endotracheal anesthesia was induced. The patient was turned to the prone position on the Wilson frame. The patient's lumbosacral region was then prepared with Betadine scrub and Betadine solution. Sterile drapes were applied.  I then injected  the area to be incised with Marcaine with epinephrine solution. I then used the scalpel to make a linear midline incision over the L3-4, L4-5 and L5-S1 interspace. I then used electrocautery to perform a bilateral subperiosteal dissection exposing the spinous process and lamina of L3-4, L4-5 and L5-S1, exposing the old hardware. We then inserted the Verstrac retractor to provide exposure. I explored the fusion by removing the caps at the old pedicle screws at L4, L5 and S1. We removed the rods. I inspected the arthrodesis at L4-5 and L5-S1. It appeared solid.  I began the decompression by using the high speed drill to perform laminotomies at L3-4 bilaterally. We then used the Kerrison punches to widen the laminotomy and removed the ligamentum flavum at L3-4 bilaterally. We used the Kerrison punches to remove the medial facets at L3-4 bilaterally. We performed wide foraminotomies about the bilateral L3 and L4 nerve roots completing the decompression.  We now turned our attention to the posterior lumbar interbody fusion. I used a scalpel to incise the intervertebral disc at L3-4 bilaterally. I then performed a partial intervertebral discectomy at L3-4 bilaterally using the pituitary forceps. We prepared the vertebral endplates at [id number] bilaterally for the fusion by removing the soft tissues with the curettes. We then used the trial spacers to pick the appropriate sized interbody prosthesis. We prefilled his prosthesis with a combination of local morselized autograft bone that we obtained during the decompression as well as Kinnex bone graft extender. We inserted the prefilled prosthesis into the interspace at L3-4 from the right, we then expanded the prosthesis. There was a good snug fit of the prosthesis in the interspace. We then filled and the remainder of the intervertebral disc space with local morselized autograft bone and Kinnex. This completed the posterior lumbar interbody arthrodesis.  We now  turned  attention to the instrumentation. Under fluoroscopic guidance we cannulated the bilateral L3 pedicles with the bone probe. We then removed the bone probe. We then tapped the pedicle with a 5.5 millimeter tap. We then removed the tap. We probed inside the tapped pedicle with a ball probe to rule out cortical breaches. We then inserted a 6.5 x 50 millimeter pedicle screw into the L3 pedicles bilaterally under fluoroscopic guidance. We then palpated along the medial aspect of the pedicles to rule out cortical breaches. There were none. The nerve roots were not injured. We then connected the unilateral pedicle screws from L3 to the sacrum with a lordotic rod. We compressed the construct and secured the rod in place with the caps. We then tightened the caps appropriately. This completed the instrumentation from L3 to the sacrum.  We now turned our attention to the posterior lateral arthrodesis at L3-4. We used the high-speed drill to decorticate the remainder of the facets, pars, transverse process at L3-4. We then applied a combination of local morselized autograft bone and Kinnex bone graft extender over these decorticated posterior lateral structures. This completed the posterior lateral arthrodesis.  We then obtained hemostasis using bipolar electrocautery. We irrigated the wound out with bacitracin solution. We inspected the thecal sac and nerve roots and noted they were well decompressed. We then removed the retractor. We placed vancomycin powder in the wound. We reapproximated patient's thoracolumbar fascia with interrupted #1 Vicryl suture. We reapproximated patient's subcutaneous tissue with interrupted 2-0 Vicryl suture. The reapproximated patient's skin with Steri-Strips and benzoin. The wound was then coated with bacitracin ointment. A sterile dressing was applied. The drapes were removed. The patient was subsequently returned to the supine position where they were extubated by the anesthesia team. He was  then transported to the post anesthesia care unit in stable condition. All sponge instrument and needle counts were reportedly correct at the end of this case.

## 2016-07-10 LAB — BASIC METABOLIC PANEL
Anion gap: 12 (ref 5–15)
BUN: 14 mg/dL (ref 6–20)
CALCIUM: 8.6 mg/dL — AB (ref 8.9–10.3)
CO2: 23 mmol/L (ref 22–32)
Chloride: 100 mmol/L — ABNORMAL LOW (ref 101–111)
Creatinine, Ser: 0.98 mg/dL (ref 0.44–1.00)
GFR calc Af Amer: >60 mL/min (ref >60)
GFR, EST NON AFRICAN AMERICAN: 57 mL/min — AB (ref >60)
GLUCOSE: 143 mg/dL — AB (ref 65–99)
Potassium: 4.2 mmol/L (ref 3.5–5.1)
Sodium: 135 mmol/L (ref 135–145)

## 2016-07-10 LAB — CBC
HCT: 34.6 % — ABNORMAL LOW (ref 36.0–46.0)
Hemoglobin: 10.9 g/dL — ABNORMAL LOW (ref 12.0–15.0)
MCH: 26.3 pg (ref 26.0–34.0)
MCHC: 31.5 g/dL (ref 30.0–36.0)
MCV: 83.6 fL (ref 78.0–100.0)
PLATELETS: 197 10*3/uL (ref 150–400)
RBC: 4.14 MIL/uL (ref 3.87–5.11)
RDW: 15.3 % (ref 11.5–15.5)
WBC: 9.5 10*3/uL (ref 4.0–10.5)

## 2016-07-10 MED ORDER — CYCLOBENZAPRINE HCL 10 MG PO TABS: 10.0000 mg | ORAL_TABLET | Freq: Three times a day (TID) | ORAL | 1 refills | Status: DC | PRN

## 2016-07-10 MED ORDER — DOCUSATE SODIUM 100 MG PO CAPS: 100.0000 mg | ORAL_CAPSULE | Freq: Two times a day (BID) | ORAL | 0 refills | Status: DC

## 2016-07-10 MED ORDER — CYCLOBENZAPRINE HCL 10 MG PO TABS: 10.0000 mg | ORAL_TABLET | Freq: Three times a day (TID) | ORAL | Status: DC | PRN

## 2016-07-10 MED ORDER — OXYCODONE-ACETAMINOPHEN 5-325 MG PO TABS: 1.0000 | ORAL_TABLET | ORAL | 0 refills | Status: DC | PRN

## 2016-07-10 MED FILL — Heparin Sodium (Porcine) Inj 1000 Unit/ML: INTRAMUSCULAR | Qty: 30 | Status: AC

## 2016-07-10 MED FILL — Sodium Chloride IV Soln 0.9%: INTRAVENOUS | Qty: 1000 | Status: AC

## 2016-07-10 NOTE — Progress Notes (Signed)
Pt doing well. Pt given D/C instructions with Rx's, verbal understanding was provided. Pt stated "she can get a RW from a family member". Pt's incision is clean and dry with no sign of infection. Pt's IV was removed prior to D/C. Pt D/C'd home via wheelchair @ 0915 per MD order. Pt is stable @ D/C and has no other needs at this time. Holli Humbles, RN

## 2016-07-10 NOTE — Discharge Summary (Signed)
Physician Discharge Summary  Patient ID: [name] MRN: [id number] DOB/AGE: 03-14-1946 71 y.o.  Admit date: 07/09/2016 Discharge date: 07/10/2016  Admission Diagnoses:L3-4 spinal stenosis, lumbago, lumbar radiculopathy, neurogenic claudication  Discharge Diagnoses: The same Active Problems:   Lumbar stenosis with neurogenic claudication   Discharged Condition: good  Hospital Course: I performed an exploration of the patient's lumbar fusion with an L3-4 decompression, instrumentation, and fusion on 07/09/2016. The surgery went well.  The patient's postoperative course was unremarkable. On postoperative day #1 the patient's leg pain was gone. She requested discharge to home. She was given written and oral discharge instructions. All her questions were answered.  Consults: Physical therapy Significant Diagnostic Studies: None Treatments: L3-4 decompression, instrumentation, fusion, exploration of lumbar fusion Discharge Exam: Blood pressure 132/72, pulse 78, temperature 97.8 F (36.6 C), temperature source Oral, resp. rate 18, height 5\' 4"  (1.626 m), weight 93 kg (205 lb), SpO2 97 %. The patient is alert and pleasant. She looks well. Her dressing is clean and dry. Her strength is normal in her lower extremities.  Disposition: Home  Discharge Instructions    Call MD for:  difficulty breathing, headache or visual disturbances    Complete by:  As directed   Call MD for:  extreme fatigue    Complete by:  As directed   Call MD for:  hives    Complete by:  As directed   Call MD for:  persistant dizziness or light-headedness    Complete by:  As directed   Call MD for:  persistant nausea and vomiting    Complete by:  As directed   Call MD for:  redness, tenderness, or signs of infection (pain, swelling, redness, odor or green/yellow discharge around incision site)    Complete by:  As directed   Call MD for:  severe uncontrolled pain    Complete by:  As directed   Call MD for:   temperature >100.4    Complete by:  As directed   Diet - low sodium heart healthy    Complete by:  As directed   Discharge instructions    Complete by:  As directed   Call [phone] for a followup appointment. Take a stool softener while you are using pain medications.   Driving Restrictions    Complete by:  As directed   Do not drive for 2 weeks.   Increase activity slowly    Complete by:  As directed   Lifting restrictions    Complete by:  As directed   Do not lift more than 5 pounds. No excessive bending or twisting.   May shower / Bathe    Complete by:  As directed   He may shower after the pain she is removed 3 days after surgery. Leave the incision alone.   Remove dressing in 48 hours    Complete by:  As directed   Your stitches are under the scan and will dissolve by themselves. The Steri-Strips will fall off after you take a few showers. Do not rub back or pick at the wound, Leave the wound alone.       Medication List    STOP taking these medications   acetaminophen 325 MG tablet Commonly known as:  TYLENOL     TAKE these medications   cyclobenzaprine 10 MG tablet Commonly known as:  FLEXERIL Take 1 tablet (10 mg total) by mouth 3 (three) times daily as needed for muscle spasms.   docusate sodium 100 MG capsule Commonly known  as:  COLACE Take 1 capsule (100 mg total) by mouth 2 (two) times daily.   oxyCODONE-acetaminophen 5-325 MG tablet Commonly known as:  PERCOCET/ROXICET Take 1-2 tablets by mouth every 4 (four) hours as needed for moderate pain.        SignedOphelia Charter 07/10/2016, 7:39 AM

## 2016-07-10 NOTE — Discharge Instructions (Signed)

## 2016-07-10 NOTE — Evaluation (Signed)
Physical Therapy Evaluation Patient Details Name: [name] MRN: [id number] DOB: 11-May-1946 Today's Date: 07/10/2016   History of Present Illness  70 yo admitted for L3-4 decompression fusion. PMHx: HTN, arthritis, L4-5 fusion  Clinical Impression  Pt very pleasant and eager to mobilize for discharge. Pt with decreased functional mobility, gait and balance who will benefit from acute therapy to maximize mobility, function and independence. Pt educated for back precautions with handout provided with demonstration and discussion of transfers, ADLs, brace wear, gait, and functional mobility with pt needing cues to maintain no twisting with all above. Will follow acutely.     Follow Up Recommendations Outpatient PT    Equipment Recommendations  Rolling walker with 5" wheels    Recommendations for Other Services       Precautions / Restrictions Precautions Precautions: Fall;Back Precaution Booklet Issued: Yes (comment) Required Braces or Orthoses: Spinal Brace Spinal Brace: Lumbar corset;Applied in sitting position      Mobility  Bed Mobility Overal bed mobility: Needs Assistance Bed Mobility: Rolling;Sidelying to Sit;Sit to Sidelying Rolling: Supervision Sidelying to sit: Supervision     Sit to sidelying: Supervision General bed mobility comments: cues for sequence and not to twist  Transfers Overall transfer level: Needs assistance   Transfers: Sit to/from Stand Sit to Stand: Min assist         General transfer comment: cues for posture with assist to rise  Ambulation/Gait Ambulation/Gait assistance: Min guard Ambulation Distance (Feet): 300 Feet Assistive device: Rolling walker (2 wheeled) Gait Pattern/deviations: Step-through pattern;Wide base of support   Gait velocity interpretation: Below normal speed for age/gender General Gait Details: cues for position in RW and posture, pt with wide unsteady gait with improved function and narrowed BOS with RW  use  Stairs Stairs: Yes Stairs assistance: Supervision Stair Management: Step to pattern;Forwards;Backwards;Two rails Number of Stairs: 3 General stair comments: pt ascends forward and descends backward per pt preference with cues for sequence  Wheelchair Mobility    Modified Rankin (Stroke Patients Only)       Balance Overall balance assessment: Needs assistance   Sitting balance-Leahy Scale: Good       Standing balance-Leahy Scale: Poor                               Pertinent Vitals/Pain Pain Assessment: No/denies pain    Home Living Family/patient expects to be discharged to:: Private residence Living Arrangements: Spouse/significant other Available Help at Discharge: Available 24 hours/day Type of Home: House Home Access: Stairs to enter Entrance Stairs-Rails: Psychiatric nurse of Steps: 3 Home Layout: One level Home Equipment: Walker - 4 wheels;Shower seat      Prior Function Level of Independence: Independent         Comments: pt works in Estate [employer]        Extremity/Trunk Assessment   Upper Extremity Assessment: Overall WFL for tasks assessed           Lower Extremity Assessment: Generalized weakness      Cervical / Trunk Assessment: Normal  Communication   Communication: No difficulties  Cognition Arousal/Alertness: Awake/alert Behavior During Therapy: WFL for tasks assessed/performed Overall Cognitive Status: Within Functional Limits for tasks assessed                      General Comments      Exercises        Assessment/Plan  PT Assessment Patient needs continued PT services  PT Diagnosis Difficulty walking   PT Problem List Decreased strength;Decreased activity tolerance;Decreased balance;Decreased knowledge of use of DME  PT Treatment Interventions DME instruction;Gait training;Stair training;Therapeutic activities;Therapeutic exercise;Patient/family  education   PT Goals (Current goals can be found in the Care Plan section) Acute Rehab PT Goals Patient Stated Goal: return to work PT Goal Formulation: With patient Time For Goal Achievement: 07/24/16 Potential to Achieve Goals: Fair    Frequency Min 5X/week   Barriers to discharge        Co-evaluation               End of Session Equipment Utilized During Treatment: Back brace Activity Tolerance: Patient tolerated treatment well Patient left: in bed;with call bell/phone within reach Nurse Communication: Mobility status;Precautions         Time: [id number] PT Time Calculation (min) (ACUTE ONLY): 16 min   Charges:   PT Evaluation $PT Eval Moderate Complexity: 1 Procedure     PT G CodesMelford Aase 07/10/2016, 9:08 AM Elwyn Reach, [place]

## 2016-07-13 NOTE — Anesthesia Postprocedure Evaluation (Signed)
Anesthesia Post Note  Patient: [name]  Procedure(s) Performed: Procedure(s) (LRB): lumbar three-four  Exploration/extension of fusion/Interbody prosthesis/posterior segmental instrumentation/posterolateral arthrodesis (N/A)  Patient location during evaluation: PACU Anesthesia Type: General Level of consciousness: awake and alert and patient cooperative Pain management: pain level controlled Vital Signs Assessment: post-procedure vital signs reviewed and stable Respiratory status: spontaneous breathing and respiratory function stable Cardiovascular status: stable Anesthetic complications: no    Last Vitals:  Vitals:   07/10/16 0400 07/10/16 0828  BP: 132/72 121/73  Pulse: 78 89  Resp: 18 20  Temp: 36.6 C 36.9 C    Last Pain:  Vitals:   07/10/16 0910  TempSrc:   PainSc: [place]

## 2016-08-04 DIAGNOSIS — M4316 Spondylolisthesis, lumbar region: Secondary | ICD-10-CM | POA: Diagnosis not present

## 2016-08-28 DIAGNOSIS — R7301 Impaired fasting glucose: Secondary | ICD-10-CM | POA: Diagnosis not present

## 2016-08-28 DIAGNOSIS — I10 Essential (primary) hypertension: Secondary | ICD-10-CM | POA: Diagnosis not present

## 2016-08-28 DIAGNOSIS — D509 Iron deficiency anemia, unspecified: Secondary | ICD-10-CM | POA: Diagnosis not present

## 2016-08-31 DIAGNOSIS — E782 Mixed hyperlipidemia: Secondary | ICD-10-CM | POA: Diagnosis not present

## 2016-08-31 DIAGNOSIS — M5410 Radiculopathy, site unspecified: Secondary | ICD-10-CM | POA: Diagnosis not present

## 2016-08-31 DIAGNOSIS — I10 Essential (primary) hypertension: Secondary | ICD-10-CM | POA: Diagnosis not present

## 2016-08-31 DIAGNOSIS — Z0000 Encounter for general adult medical examination without abnormal findings: Secondary | ICD-10-CM | POA: Diagnosis not present

## 2016-08-31 DIAGNOSIS — G589 Mononeuropathy, unspecified: Secondary | ICD-10-CM | POA: Diagnosis not present

## 2016-08-31 DIAGNOSIS — R7301 Impaired fasting glucose: Secondary | ICD-10-CM | POA: Diagnosis not present

## 2016-12-01 DIAGNOSIS — R030 Elevated blood-pressure reading, without diagnosis of hypertension: Secondary | ICD-10-CM | POA: Diagnosis not present

## 2016-12-01 DIAGNOSIS — M48061 Spinal stenosis, lumbar region without neurogenic claudication: Secondary | ICD-10-CM | POA: Diagnosis not present

## 2016-12-07 DIAGNOSIS — R05 Cough: Secondary | ICD-10-CM | POA: Diagnosis not present

## 2016-12-07 DIAGNOSIS — J060 Acute laryngopharyngitis: Secondary | ICD-10-CM | POA: Diagnosis not present

## 2016-12-14 DIAGNOSIS — H2513 Age-related nuclear cataract, bilateral: Secondary | ICD-10-CM | POA: Diagnosis not present

## 2016-12-25 NOTE — Patient Instructions (Signed)
Dawn ELIZARDO  12/25/2016     @PREFPERIOPPHARMACY @   Your procedure is scheduled on 01/07/2017.  Report to [facility] at 10:30 A.M.  Call this number if you have problems the morning of surgery:  [phone]   Remember:  Do not eat food or drink liquids after midnight.  Take these medicines the morning of surgery with A SIP OF WATER Flexeril, oxycodone   Do not wear jewelry, make-up or nail polish.  Do not wear lotions, powders, or perfumes, or deoderant.  Do not shave 48 hours prior to surgery.  Men may shave face and neck.  Do not bring valuables to the hospital.  [facility] is not responsible for any belongings or valuables.  Contacts, dentures or bridgework may not be worn into surgery.  Leave your suitcase in the car.  After surgery it may be brought to your room.  For patients admitted to the hospital, discharge time will be determined by your treatment team.  Patients discharged the day of surgery will not be allowed to drive home.    Please read over the following fact sheets that you were given. Anesthesia Post-op Instructions     PATIENT INSTRUCTIONS POST-ANESTHESIA  IMMEDIATELY FOLLOWING SURGERY:  Do not drive or operate machinery for the first twenty four hours after surgery.  Do not make any important decisions for twenty four hours after surgery or while taking narcotic pain medications or sedatives.  If you develop intractable nausea and vomiting or a severe headache please notify your doctor immediately.  FOLLOW-UP:  Please make an appointment with your surgeon as instructed. You do not need to follow up with anesthesia unless specifically instructed to do so.  WOUND CARE INSTRUCTIONS (if applicable):  Keep a dry clean dressing on the anesthesia/puncture wound site if there is drainage.  Once the wound has quit draining you may leave it open to air.  Generally you should leave the bandage intact for twenty four hours unless there is drainage.  If the  epidural site drains for more than 36-48 hours please call the anesthesia department.  QUESTIONS?:  Please feel free to call your physician or the hospital operator if you have any questions, and they will be happy to assist you.      Cataract Surgery Cataract surgery is a procedure to remove a cataract from your eye. A cataract is cloudiness on the lens of your eye. The lens focuses light inside the eye. When a lens becomes cloudy, your vision is affected. Cataract surgery is a procedure to remove the cloudy lens. A substitute lens (intraocular lens or IOL) is usually inserted as a replacement for the cloudy lens. Tell a health care provider about:  Any allergies you have.  All medicines you are taking, including vitamins, herbs, eye drops, creams, and over-the-counter medicines.  Any problems you or family members have had with anesthetic medicines.  Any blood disorders you have.  Any surgeries you have had, especially eye surgeries that include refractive surgery, such as PRK and LASIK.  Any medical conditions you have.  Whether you are pregnant or may be pregnant. What are the risks? Generally, this is a safe procedure. However, problems may occur, including:  Infection.  Bleeding.  Glaucoma.  Retinal detachment.  Allergic reactions to medicines.  Damage to other structures or organs.  Inflammation of the eye.  Clouding of the part of your eye that holds an IOL in place (after-cataract), if an IOL was inserted. This is fairly common.  An IOL moving out of position, if an IOL was inserted. This is very rare.  Loss of vision. This is rare. What happens before the procedure?  Follow instructions from your health care provider about eating or drinking restrictions.  Ask your health care provider about:  Changing or stopping your regular medicines, including any eye drops you have been prescribed. This is especially important if you are taking diabetes medicines or  blood thinners.  Taking medicines such as aspirin and ibuprofen. These medicines can thin your blood. Do not take these medicines before your procedure if your health care provider instructs you not to.  Do not put contact lenses in either eye on the day of your surgery.  Plan for someone to drive you to and from the procedure.  If you will be going home right after the procedure, plan to have someone with you for 24 hours. What happens during the procedure?  An IV tube may be inserted into one of your veins.  You will be given one or more of the following:  A medicine to help you relax (sedative).  A medicine to numb the area (local anesthetic). This may be numbing eye drops or an injection that is given behind the eye.  A small cut (incision) will be made to the edge of the clear, dome-shaped surface that covers the front of the eye (cornea).  A small probe will be inserted into the eye. This device gives off ultrasound waves that soften and break up the cloudy center of the lens. This makes it easier for the cloudy lens to be removed by suction.  An IOL may be implanted.  Part of the capsule that surrounds the lens will be left in the eye to support the IOL.  Your surgeon may use stitches (sutures) to close the incision. The procedure may vary among health care providers and hospitals. What happens after the procedure?  Your blood pressure, heart rate, breathing rate, and blood oxygen level will be monitored often until the medicines you were given have worn off.  You may be given a protective shield to wear over your eyes.  Do not drive for 24 hours if you received a sedative. This information is not intended to replace advice given to you by your health care provider. Make sure you discuss any questions you have with your health care provider. Document Released: 10/22/2011 Document Revised: 04/09/2016 Document Reviewed: 09/12/2015 Elsevier Interactive Patient Education   2017 [employer].

## 2016-12-29 DIAGNOSIS — H2512 Age-related nuclear cataract, left eye: Secondary | ICD-10-CM | POA: Diagnosis not present

## 2016-12-30 ENCOUNTER — Encounter (HOSPITAL_COMMUNITY)
Admission: RE | Admit: 2016-12-30 | Discharge: 2016-12-30 | Disposition: A | Discharge status: Home / Self Care | Payer: Medicare Other | Source: Ambulatory Visit | Attending: Ophthalmology | Admitting: Ophthalmology

## 2016-12-30 ENCOUNTER — Encounter (HOSPITAL_COMMUNITY): Payer: Self-pay

## 2016-12-30 DIAGNOSIS — I10 Essential (primary) hypertension: Secondary | ICD-10-CM | POA: Diagnosis not present

## 2016-12-30 DIAGNOSIS — R7301 Impaired fasting glucose: Secondary | ICD-10-CM | POA: Diagnosis not present

## 2016-12-30 DIAGNOSIS — E782 Mixed hyperlipidemia: Secondary | ICD-10-CM | POA: Diagnosis not present

## 2016-12-30 DIAGNOSIS — Z01818 Encounter for other preprocedural examination: Secondary | ICD-10-CM | POA: Insufficient documentation

## 2016-12-30 DIAGNOSIS — H2512 Age-related nuclear cataract, left eye: Secondary | ICD-10-CM | POA: Insufficient documentation

## 2016-12-31 DIAGNOSIS — L821 Other seborrheic keratosis: Secondary | ICD-10-CM | POA: Diagnosis not present

## 2016-12-31 DIAGNOSIS — L820 Inflamed seborrheic keratosis: Secondary | ICD-10-CM | POA: Diagnosis not present

## 2016-12-31 DIAGNOSIS — D225 Melanocytic nevi of trunk: Secondary | ICD-10-CM | POA: Diagnosis not present

## 2017-01-01 DIAGNOSIS — I10 Essential (primary) hypertension: Secondary | ICD-10-CM | POA: Diagnosis not present

## 2017-01-01 DIAGNOSIS — H269 Unspecified cataract: Secondary | ICD-10-CM | POA: Diagnosis not present

## 2017-01-01 DIAGNOSIS — Z0000 Encounter for general adult medical examination without abnormal findings: Secondary | ICD-10-CM | POA: Diagnosis not present

## 2017-01-01 DIAGNOSIS — R531 Weakness: Secondary | ICD-10-CM | POA: Diagnosis not present

## 2017-01-01 DIAGNOSIS — D509 Iron deficiency anemia, unspecified: Secondary | ICD-10-CM | POA: Diagnosis not present

## 2017-01-01 DIAGNOSIS — E782 Mixed hyperlipidemia: Secondary | ICD-10-CM | POA: Diagnosis not present

## 2017-01-07 ENCOUNTER — Encounter (HOSPITAL_COMMUNITY)
Admission: RE | Disposition: A | Discharge status: Home / Self Care | Payer: Self-pay | Source: Ambulatory Visit | Attending: Ophthalmology

## 2017-01-07 ENCOUNTER — Encounter (HOSPITAL_COMMUNITY): Payer: Self-pay | Admitting: *Deleted

## 2017-01-07 ENCOUNTER — Ambulatory Visit (HOSPITAL_COMMUNITY)
Admission: RE | Admit: 2017-01-07 | Discharge: 2017-01-07 | Disposition: A | Discharge status: Home / Self Care | Payer: Medicare Other | Source: Ambulatory Visit | Attending: Ophthalmology | Admitting: Ophthalmology

## 2017-01-07 ENCOUNTER — Ambulatory Visit (HOSPITAL_COMMUNITY): Payer: Medicare Other | Admitting: Anesthesiology

## 2017-01-07 DIAGNOSIS — Z6835 Body mass index (BMI) 35.0-35.9, adult: Secondary | ICD-10-CM | POA: Diagnosis not present

## 2017-01-07 DIAGNOSIS — H2512 Age-related nuclear cataract, left eye: Secondary | ICD-10-CM | POA: Insufficient documentation

## 2017-01-07 DIAGNOSIS — I10 Essential (primary) hypertension: Secondary | ICD-10-CM | POA: Diagnosis not present

## 2017-01-07 HISTORY — PX: CATARACT EXTRACTION W/PHACO: SHX586

## 2017-01-07 SURGERY — PHACOEMULSIFICATION, CATARACT, WITH IOL INSERTION
Anesthesia: Monitor Anesthesia Care | Site: Eye | Laterality: Left

## 2017-01-07 MED ORDER — BSS IO SOLN
INTRAOCULAR | Status: DC | PRN
  Administered 2017-01-07: 500 mL

## 2017-01-07 MED ORDER — CYCLOPENTOLATE-PHENYLEPHRINE 0.2-1 % OP SOLN
1.0000 [drp] | OPHTHALMIC | Status: AC
  Administered 2017-01-07 (×3): 1 [drp] via OPHTHALMIC

## 2017-01-07 MED ORDER — NEOMYCIN-POLYMYXIN-DEXAMETH 3.5-10000-0.1 OP SUSP
OPHTHALMIC | Status: DC | PRN
  Administered 2017-01-07: 2 [drp] via OPHTHALMIC

## 2017-01-07 MED ORDER — ONDANSETRON HCL 4 MG/2ML IJ SOLN
INTRAMUSCULAR | Status: AC
  Filled 2017-01-07: qty 2

## 2017-01-07 MED ORDER — POVIDONE-IODINE 5 % OP SOLN
OPHTHALMIC | Status: DC | PRN
  Administered 2017-01-07: 1 via OPHTHALMIC

## 2017-01-07 MED ORDER — TETRACAINE HCL 0.5 % OP SOLN
1.0000 [drp] | OPHTHALMIC | Status: AC
  Administered 2017-01-07 (×3): 1 [drp] via OPHTHALMIC

## 2017-01-07 MED ORDER — FENTANYL CITRATE (PF) 100 MCG/2ML IJ SOLN
INTRAMUSCULAR | Status: AC
  Filled 2017-01-07: qty 2

## 2017-01-07 MED ORDER — LIDOCAINE HCL (PF) 1 % IJ SOLN
INTRAMUSCULAR | Status: DC | PRN
Start: 2017-01-07 — End: 2017-01-07
  Administered 2017-01-07: .6 mL

## 2017-01-07 MED ORDER — BSS IO SOLN
INTRAOCULAR | Status: DC | PRN
Start: 2017-01-07 — End: 2017-01-07
  Administered 2017-01-07: 500 mL via INTRAOCULAR

## 2017-01-07 MED ORDER — PHENYLEPHRINE HCL 2.5 % OP SOLN
1.0000 [drp] | OPHTHALMIC | Status: AC
  Administered 2017-01-07 (×3): 1 [drp] via OPHTHALMIC

## 2017-01-07 MED ORDER — ONDANSETRON HCL 4 MG/2ML IJ SOLN
4.0000 mg | Freq: Once | INTRAMUSCULAR | Status: AC
  Administered 2017-01-07: 4 mg via INTRAVENOUS

## 2017-01-07 MED ORDER — LIDOCAINE HCL 3.5 % OP GEL: 1.0000 "application " | Freq: Once | OPHTHALMIC | Status: DC

## 2017-01-07 MED ORDER — MIDAZOLAM HCL 2 MG/2ML IJ SOLN
INTRAMUSCULAR | Status: AC
  Filled 2017-01-07: qty 2

## 2017-01-07 MED ORDER — FENTANYL CITRATE (PF) 100 MCG/2ML IJ SOLN
25.0000 ug | Freq: Once | INTRAMUSCULAR | Status: AC
  Administered 2017-01-07: 25 ug via INTRAVENOUS

## 2017-01-07 MED ORDER — PROVISC 10 MG/ML IO SOLN
INTRAOCULAR | Status: DC | PRN
  Administered 2017-01-07: 0.85 mL via INTRAOCULAR

## 2017-01-07 MED ORDER — MIDAZOLAM HCL 2 MG/2ML IJ SOLN
1.0000 mg | INTRAMUSCULAR | Status: AC
  Administered 2017-01-07: 2 mg via INTRAVENOUS

## 2017-01-07 MED ORDER — LACTATED RINGERS IV SOLN
INTRAVENOUS | Status: DC
  Administered 2017-01-07: 12:00:00 via INTRAVENOUS

## 2017-01-07 SURGICAL SUPPLY — 12 items
EYE SHIELD UNIVERSAL CLEAR (GAUZE/BANDAGES/DRESSINGS) ×1 IMPLANT
GLOVE BIOGEL PI IND STRL 6.5 (GLOVE) IMPLANT
GLOVE BIOGEL PI IND STRL 8 (GLOVE) IMPLANT
GLOVE BIOGEL PI INDICATOR 6.5 (GLOVE) ×1
GLOVE BIOGEL PI INDICATOR 8 (GLOVE) ×1
LENS INTRAOCULAR RESTOR ×1 IMPLANT
PAD ARMBOARD 7.5X6 YLW CONV (MISCELLANEOUS) ×1 IMPLANT
PROC W SPEC LENS (INTRAOCULAR LENS) ×2
PROCESS W SPEC LENS (INTRAOCULAR LENS) IMPLANT
SYRINGE LUER LOK 1CC (MISCELLANEOUS) ×1 IMPLANT
TAPE TRANSPARENT 1/2IN (GAUZE/BANDAGES/DRESSINGS) ×1 IMPLANT
WATER STERILE IRR 250ML POUR (IV SOLUTION) ×1 IMPLANT

## 2017-01-07 NOTE — Transfer of Care (Signed)
Immediate Anesthesia Transfer of Care Note  Patient: [name]  Procedure(s) Performed: Procedure(s) with comments: CATARACT EXTRACTION PHACO AND INTRAOCULAR LENS PLACEMENT (IOC) (Left) - left CDE 9.31   Patient Location: Short Stay  Anesthesia Type:MAC  Level of Consciousness: awake, alert , oriented and patient cooperative  Airway & Oxygen Therapy: Patient Spontanous Breathing  Post-op Assessment: Report given to RN and Post -op Vital signs reviewed and stable  Post vital signs: Reviewed and stable  Last Vitals:  Vitals:   01/07/17 1220 01/07/17 1225  BP:  139/85  Pulse:    Resp: (!) 27 19  Temp:      Last Pain:  Vitals:   01/07/17 1151  TempSrc: Oral      Patients Stated Pain Goal: 7 (74/08/14 4818)  Complications: No apparent anesthesia complications

## 2017-01-07 NOTE — Op Note (Signed)
Date of Admission: 01/07/2017  Date of Surgery: 01/07/2017  Pre-Op Dx: Cataract Left  Eye  Post-Op Dx: Senile Nuclear Cataract  Left  Eye,  Dx Code H25.12  Surgeon: Tonny Branch, M.D.  Assistants: None  Anesthesia: Topical with MAC  Indications: Painless, progressive loss of vision with compromise of daily activities.  Surgery: Cataract Extraction with Intraocular lens Implant Left Eye  Discription: The patient had dilating drops and viscous lidocaine placed into the Left eye in the pre-op holding area. After transfer to the operating room, a time out was performed. The patient was then prepped and draped. Beginning with a 36 degree blade a paracentesis port was made at the surgeon's 2 o'clock position. The anterior chamber was then filled with 1% non-preserved lidocaine. This was followed by filling the anterior chamber with Provisc.  A 2.14m keratome blade was used to make a clear corneal incision at the temporal limbus.  A bent cystatome needle was used to create a continuous tear capsulotomy. Hydrodissection was performed with balanced salt solution on a Fine canula. The lens nucleus was then removed using the phacoemulsification handpiece. Residual cortex was removed with the I&A handpiece. The anterior chamber and capsular bag were refilled with Provisc. A posterior chamber intraocular lens was placed into the capsular bag with it's injector. The implant was positioned with the Kuglan hook. The Provisc was then removed from the anterior chamber and capsular bag with the I&A handpiece. Stromal hydration of the main incision and paracentesis port was performed with BSS on a Fine canula. The wounds were tested for leak which was negative. The patient tolerated the procedure well. There were no operative complications. The patient was then transferred to the recovery room in stable condition.  Complications: None  Specimen: None  EBL: None  Prosthetic device: Alcon ReStor [id number], power 21.0,  SN [id number]

## 2017-01-07 NOTE — Anesthesia Postprocedure Evaluation (Signed)
Anesthesia Post Note  Patient: [name]  Procedure(s) Performed: Procedure(s) (LRB): CATARACT EXTRACTION PHACO AND INTRAOCULAR LENS PLACEMENT (IOC) (Left)  Patient location during evaluation: Short Stay Anesthesia Type: MAC Level of consciousness: awake and alert and oriented Pain management: pain level controlled Vital Signs Assessment: post-procedure vital signs reviewed and stable Respiratory status: spontaneous breathing Cardiovascular status: stable Postop Assessment: no signs of nausea or vomiting Anesthetic complications: no     Last Vitals:  Vitals:   01/07/17 1220 01/07/17 1225  BP:  139/85  Pulse:    Resp: (!) 27 19  Temp:      Last Pain:  Vitals:   01/07/17 1151  TempSrc: Oral                 ADAMS, AMY A

## 2017-01-07 NOTE — H&P (Signed)
I have reviewed the H&P, the patient was re-examined, and I have identified no interval changes in medical condition and plan of care since the history and physical of record  

## 2017-01-07 NOTE — Anesthesia Preprocedure Evaluation (Signed)
Anesthesia Evaluation  Patient identified by MRN, date of birth, ID band Patient awake    Reviewed: Allergy & Precautions, NPO status , Patient's Chart, lab work & pertinent test results  History of Anesthesia Complications (+) PONVNegative for: history of anesthetic complications  Airway Mallampati: I  TM Distance: >3 FB Neck ROM: Full    Dental  (+) Edentulous Upper, Edentulous Lower   Pulmonary neg pulmonary ROS,    breath sounds clear to auscultation       Cardiovascular hypertension, (-) angina Rhythm:Regular Rate:Normal     Neuro/Psych Back pain: tylenol    GI/Hepatic negative GI ROS, Neg liver ROS,   Endo/Other  Morbid obesity  Renal/GU negative Renal ROS     Musculoskeletal  (+) Arthritis , Osteoarthritis,    Abdominal (+) + obese,   Peds  Hematology negative hematology ROS (+)   Anesthesia Other Findings   Reproductive/Obstetrics                             Anesthesia Physical Anesthesia Plan  ASA: II  Anesthesia Plan: MAC   Post-op Pain Management:    Induction: Intravenous  Airway Management Planned: Nasal Cannula  Additional Equipment:   Intra-op Plan:   Post-operative Plan:   Informed Consent: I have reviewed the patients History and Physical, chart, labs and discussed the procedure including the risks, benefits and alternatives for the proposed anesthesia with the patient or authorized representative who has indicated his/her understanding and acceptance.     Plan Discussed with:   Anesthesia Plan Comments:         Anesthesia Quick Evaluation

## 2017-01-07 NOTE — Anesthesia Procedure Notes (Signed)
Procedure Name: MAC Date/Time: 01/07/2017 12:28 PM Performed by: Andree Elk, Vonzell Lindblad A Pre-anesthesia Checklist: Patient identified, Timeout performed, Emergency Drugs available and Suction available Oxygen Delivery Method: Nasal cannula

## 2017-01-11 ENCOUNTER — Encounter (HOSPITAL_COMMUNITY): Payer: Self-pay | Admitting: Ophthalmology

## 2017-01-18 DIAGNOSIS — H25811 Combined forms of age-related cataract, right eye: Secondary | ICD-10-CM | POA: Diagnosis not present

## 2017-01-20 ENCOUNTER — Encounter (HOSPITAL_COMMUNITY): Payer: Self-pay

## 2017-01-20 ENCOUNTER — Encounter (HOSPITAL_COMMUNITY)
Admission: RE | Admit: 2017-01-20 | Discharge: 2017-01-20 | Disposition: A | Discharge status: Home / Self Care | Payer: Medicare Other | Source: Ambulatory Visit | Attending: Ophthalmology | Admitting: Ophthalmology

## 2017-01-25 ENCOUNTER — Ambulatory Visit (HOSPITAL_COMMUNITY): Payer: Medicare Other | Admitting: Anesthesiology

## 2017-01-25 ENCOUNTER — Encounter (HOSPITAL_COMMUNITY)
Admission: RE | Disposition: A | Discharge status: Home / Self Care | Payer: Self-pay | Source: Ambulatory Visit | Attending: Ophthalmology

## 2017-01-25 ENCOUNTER — Encounter (HOSPITAL_COMMUNITY): Payer: Self-pay | Admitting: *Deleted

## 2017-01-25 ENCOUNTER — Ambulatory Visit (HOSPITAL_COMMUNITY)
Admission: RE | Admit: 2017-01-25 | Discharge: 2017-01-25 | Disposition: A | Discharge status: Home / Self Care | Payer: Medicare Other | Source: Ambulatory Visit | Attending: Ophthalmology | Admitting: Ophthalmology

## 2017-01-25 DIAGNOSIS — M1990 Unspecified osteoarthritis, unspecified site: Secondary | ICD-10-CM | POA: Insufficient documentation

## 2017-01-25 DIAGNOSIS — H269 Unspecified cataract: Secondary | ICD-10-CM | POA: Diagnosis not present

## 2017-01-25 DIAGNOSIS — I10 Essential (primary) hypertension: Secondary | ICD-10-CM | POA: Diagnosis not present

## 2017-01-25 DIAGNOSIS — E669 Obesity, unspecified: Secondary | ICD-10-CM | POA: Insufficient documentation

## 2017-01-25 DIAGNOSIS — H25811 Combined forms of age-related cataract, right eye: Secondary | ICD-10-CM | POA: Insufficient documentation

## 2017-01-25 HISTORY — PX: CATARACT EXTRACTION W/PHACO: SHX586

## 2017-01-25 SURGERY — PHACOEMULSIFICATION, CATARACT, WITH IOL INSERTION
Anesthesia: Monitor Anesthesia Care | Site: Eye | Laterality: Right

## 2017-01-25 MED ORDER — PHENYLEPHRINE HCL 2.5 % OP SOLN
1.0000 [drp] | OPHTHALMIC | Status: AC
  Administered 2017-01-25 (×3): 1 [drp] via OPHTHALMIC

## 2017-01-25 MED ORDER — MIDAZOLAM HCL 2 MG/2ML IJ SOLN
1.0000 mg | INTRAMUSCULAR | Status: AC
  Administered 2017-01-25: 2 mg via INTRAVENOUS

## 2017-01-25 MED ORDER — LIDOCAINE HCL 3.5 % OP GEL
1.0000 "application " | Freq: Once | OPHTHALMIC | Status: AC
  Administered 2017-01-25: 1 via OPHTHALMIC

## 2017-01-25 MED ORDER — PROVISC 10 MG/ML IO SOLN
INTRAOCULAR | Status: DC | PRN
  Administered 2017-01-25: 0.85 mL via INTRAOCULAR

## 2017-01-25 MED ORDER — LIDOCAINE 3.5 % OP GEL OPTIME - NO CHARGE
OPHTHALMIC | Status: DC | PRN
  Administered 2017-01-25: 2 [drp] via OPHTHALMIC

## 2017-01-25 MED ORDER — EPINEPHRINE PF 1 MG/ML IJ SOLN
INTRAMUSCULAR | Status: AC
  Filled 2017-01-25: qty 1

## 2017-01-25 MED ORDER — TETRACAINE HCL 0.5 % OP SOLN
1.0000 [drp] | OPHTHALMIC | Status: AC
  Administered 2017-01-25 (×3): 1 [drp] via OPHTHALMIC

## 2017-01-25 MED ORDER — EPINEPHRINE PF 1 MG/ML IJ SOLN
INTRAOCULAR | Status: DC | PRN
  Administered 2017-01-25: 500 mL

## 2017-01-25 MED ORDER — LACTATED RINGERS IV SOLN
INTRAVENOUS | Status: DC
  Administered 2017-01-25: 1000 mL via INTRAVENOUS

## 2017-01-25 MED ORDER — NEOMYCIN-POLYMYXIN-DEXAMETH 3.5-10000-0.1 OP SUSP
OPHTHALMIC | Status: DC | PRN
  Administered 2017-01-25: 2 [drp] via OPHTHALMIC

## 2017-01-25 MED ORDER — CYCLOPENTOLATE-PHENYLEPHRINE 0.2-1 % OP SOLN
1.0000 [drp] | OPHTHALMIC | Status: AC
  Administered 2017-01-25 (×3): 1 [drp] via OPHTHALMIC

## 2017-01-25 MED ORDER — FENTANYL CITRATE (PF) 100 MCG/2ML IJ SOLN
25.0000 ug | INTRAMUSCULAR | Status: AC
  Administered 2017-01-25 (×2): 25 ug via INTRAVENOUS

## 2017-01-25 MED ORDER — MIDAZOLAM HCL 2 MG/2ML IJ SOLN
INTRAMUSCULAR | Status: AC
  Filled 2017-01-25: qty 2

## 2017-01-25 MED ORDER — LIDOCAINE HCL (PF) 1 % IJ SOLN
INTRAMUSCULAR | Status: DC | PRN
  Administered 2017-01-25: .06 mL

## 2017-01-25 MED ORDER — POVIDONE-IODINE 5 % OP SOLN
OPHTHALMIC | Status: DC | PRN
  Administered 2017-01-25: 1 via OPHTHALMIC

## 2017-01-25 MED ORDER — BSS IO SOLN
INTRAOCULAR | Status: DC | PRN
  Administered 2017-01-25: 15 mL via INTRAOCULAR

## 2017-01-25 MED ORDER — FENTANYL CITRATE (PF) 100 MCG/2ML IJ SOLN
INTRAMUSCULAR | Status: AC
  Filled 2017-01-25: qty 2

## 2017-01-25 SURGICAL SUPPLY — 23 items
CAPSULAR TENSION RING-AMO (OPHTHALMIC RELATED) IMPLANT
CLOTH BEACON ORANGE TIMEOUT ST (SAFETY) ×1 IMPLANT
EYE SHIELD UNIVERSAL CLEAR (GAUZE/BANDAGES/DRESSINGS) ×1 IMPLANT
GLOVE BIOGEL PI IND STRL 6.5 (GLOVE) IMPLANT
GLOVE BIOGEL PI IND STRL 7.0 (GLOVE) IMPLANT
GLOVE BIOGEL PI INDICATOR 6.5 (GLOVE) ×1
GLOVE BIOGEL PI INDICATOR 7.0 (GLOVE) ×1
GLOVE EXAM NITRILE LRG STRL (GLOVE) ×1 IMPLANT
GLOVE EXAM NITRILE MD LF STRL (GLOVE) IMPLANT
KIT VITRECTOMY (OPHTHALMIC RELATED) IMPLANT
LENS INTRAOCULAR RESTOR ×1 IMPLANT
PAD ARMBOARD 7.5X6 YLW CONV (MISCELLANEOUS) ×1 IMPLANT
PROC W NO LENS (INTRAOCULAR LENS)
PROC W SPEC LENS (INTRAOCULAR LENS) ×2
PROCESS W NO LENS (INTRAOCULAR LENS) IMPLANT
PROCESS W SPEC LENS (INTRAOCULAR LENS) IMPLANT
RETRACTOR IRIS SIGHTPATH (OPHTHALMIC RELATED) IMPLANT
RING MALYGIN (MISCELLANEOUS) IMPLANT
SYRINGE LUER LOK 1CC (MISCELLANEOUS) ×1 IMPLANT
TAPE SURG TRANSPARENT 2IN (GAUZE/BANDAGES/DRESSINGS) IMPLANT
TAPE TRANSPARENT 2IN (GAUZE/BANDAGES/DRESSINGS) ×1
VISCOELASTIC ADDITIONAL (OPHTHALMIC RELATED) IMPLANT
WATER STERILE IRR 250ML POUR (IV SOLUTION) ×1 IMPLANT

## 2017-01-25 NOTE — Anesthesia Preprocedure Evaluation (Signed)
Anesthesia Evaluation  Patient identified by MRN, date of birth, ID band Patient awake    Reviewed: Allergy & Precautions, NPO status , Patient's Chart, lab work & pertinent test results  History of Anesthesia Complications (+) PONVNegative for: history of anesthetic complications  Airway Mallampati: I  TM Distance: >3 FB Neck ROM: Full    Dental  (+) Edentulous Upper, Edentulous Lower   Pulmonary neg pulmonary ROS,    breath sounds clear to auscultation       Cardiovascular hypertension, (-) angina Rhythm:Regular Rate:Normal     Neuro/Psych Back pain: tylenol    GI/Hepatic negative GI ROS, Neg liver ROS,   Endo/Other  Morbid obesity  Renal/GU negative Renal ROS     Musculoskeletal  (+) Arthritis , Osteoarthritis,    Abdominal (+) + obese,   Peds  Hematology negative hematology ROS (+)   Anesthesia Other Findings   Reproductive/Obstetrics                             Anesthesia Physical Anesthesia Plan  ASA: II  Anesthesia Plan: MAC   Post-op Pain Management:    Induction: Intravenous  Airway Management Planned: Nasal Cannula  Additional Equipment:   Intra-op Plan:   Post-operative Plan:   Informed Consent: I have reviewed the patients History and Physical, chart, labs and discussed the procedure including the risks, benefits and alternatives for the proposed anesthesia with the patient or authorized representative who has indicated his/her understanding and acceptance.     Plan Discussed with:   Anesthesia Plan Comments:         Anesthesia Quick Evaluation

## 2017-01-25 NOTE — Discharge Instructions (Signed)

## 2017-01-25 NOTE — Anesthesia Postprocedure Evaluation (Signed)
Anesthesia Post Note  Patient: [name]  Procedure(s) Performed: Procedure(s) (LRB): CATARACT EXTRACTION PHACO AND INTRAOCULAR LENS PLACEMENT (IOC) CDE:16.07 (Right)  Patient location during evaluation: Short Stay Anesthesia Type: MAC Level of consciousness: awake and alert Pain management: pain level controlled Vital Signs Assessment: post-procedure vital signs reviewed and stable Respiratory status: spontaneous breathing Anesthetic complications: no     Last Vitals:  Vitals:   01/25/17 1020 01/25/17 1043  BP:  (!) 167/88  Pulse:  73  Resp: 16 16  Temp:  36.6 C    Last Pain:  Vitals:   01/25/17 1043  TempSrc: Oral                 [place]

## 2017-01-25 NOTE — H&P (Signed)
I have reviewed the H&P, the patient was re-examined, and I have identified no interval changes in medical condition and plan of care since the history and physical of record  

## 2017-01-25 NOTE — Transfer of Care (Signed)
Immediate Anesthesia Transfer of Care Note  Patient: [name]  Procedure(s) Performed: Procedure(s) with comments: CATARACT EXTRACTION PHACO AND INTRAOCULAR LENS PLACEMENT (IOC) CDE:16.07 (Right) - right  Patient Location: PACU  Anesthesia Type:MAC  Level of Consciousness: awake and alert   Airway & Oxygen Therapy: Patient Spontanous Breathing  Post-op Assessment: Report given to RN and Post -op Vital signs reviewed and stable  Post vital signs: Reviewed and stable  Last Vitals:  Vitals:   01/25/17 1020 01/25/17 1043  BP:  (!) 167/88  Pulse:  73  Resp: 16 16  Temp:  36.6 C    Last Pain:  Vitals:   01/25/17 1043  TempSrc: Oral      Patients Stated Pain Goal: 7 (67/01/41 0301)  Complications: No apparent anesthesia complications

## 2017-01-25 NOTE — Op Note (Signed)
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  Assistants: None  Anesthesia: Topical with MAC  Indications: Painless, progressive loss of vision with compromise of daily activities.  Surgery: Cataract Extraction with Intraocular lens Implant Right Eye  Discription: The patient had dilating drops and viscous lidocaine placed into the Right eye in the pre-op holding area. After transfer to the operating room, a time out was performed. The patient was then prepped and draped. Beginning with a 16 degree blade a paracentesis port was made at the surgeon's 2 o'clock position. The anterior chamber was then filled with 1% non-preserved lidocaine. This was followed by filling the anterior chamber with Provisc.  A 2.74m keratome blade was used to make a clear corneal incision at the temporal limbus.  A bent cystatome needle was used to create a continuous tear capsulotomy. Hydrodissection was performed with balanced salt solution on a Fine canula. The lens nucleus was then removed using the phacoemulsification handpiece. Residual cortex was removed with the I&A handpiece. The anterior chamber and capsular bag were refilled with Provisc. A posterior chamber intraocular lens was placed into the capsular bag with it's injector. The implant was positioned with the Kuglan hook. The Provisc was then removed from the anterior chamber and capsular bag with the I&A handpiece. Stromal hydration of the main incision and paracentesis port was performed with BSS on a Fine canula. The wounds were tested for leak which was negative. The patient tolerated the procedure well. There were no operative complications. The patient was then transferred to the recovery room in stable condition.  Complications: None  Specimen: None  EBL: None  Prosthetic device: Alcon ReStor, [id number], power  21.0, SN [id number]

## 2017-01-25 NOTE — Anesthesia Procedure Notes (Signed)
Procedure Name: MAC Date/Time: 01/25/2017 10:21 AM Performed by: Vista Deck Pre-anesthesia Checklist: Patient identified, Emergency Drugs available, Suction available, Timeout performed and Patient being monitored Patient Re-evaluated:Patient Re-evaluated prior to inductionOxygen Delivery Method: Nasal Cannula

## 2017-01-26 ENCOUNTER — Encounter (HOSPITAL_COMMUNITY): Payer: Self-pay | Admitting: Ophthalmology

## 2017-03-29 DIAGNOSIS — E782 Mixed hyperlipidemia: Secondary | ICD-10-CM | POA: Diagnosis not present

## 2017-03-29 DIAGNOSIS — R7301 Impaired fasting glucose: Secondary | ICD-10-CM | POA: Diagnosis not present

## 2017-03-29 DIAGNOSIS — D509 Iron deficiency anemia, unspecified: Secondary | ICD-10-CM | POA: Diagnosis not present

## 2017-03-29 DIAGNOSIS — I10 Essential (primary) hypertension: Secondary | ICD-10-CM | POA: Diagnosis not present

## 2017-04-01 DIAGNOSIS — I10 Essential (primary) hypertension: Secondary | ICD-10-CM | POA: Diagnosis not present

## 2017-04-01 DIAGNOSIS — E782 Mixed hyperlipidemia: Secondary | ICD-10-CM | POA: Diagnosis not present

## 2017-04-01 DIAGNOSIS — Z0001 Encounter for general adult medical examination with abnormal findings: Secondary | ICD-10-CM | POA: Diagnosis not present

## 2017-04-01 DIAGNOSIS — R7301 Impaired fasting glucose: Secondary | ICD-10-CM | POA: Diagnosis not present

## 2017-04-01 DIAGNOSIS — Z981 Arthrodesis status: Secondary | ICD-10-CM | POA: Diagnosis not present

## 2017-06-01 DIAGNOSIS — G2581 Restless legs syndrome: Secondary | ICD-10-CM | POA: Diagnosis not present

## 2017-06-01 DIAGNOSIS — M545 Low back pain: Secondary | ICD-10-CM | POA: Diagnosis not present

## 2017-06-01 DIAGNOSIS — R030 Elevated blood-pressure reading, without diagnosis of hypertension: Secondary | ICD-10-CM | POA: Diagnosis not present

## 2017-08-03 ENCOUNTER — Other Ambulatory Visit: Payer: Self-pay | Admitting: Neurosurgery

## 2017-08-03 DIAGNOSIS — M545 Low back pain: Principal | ICD-10-CM

## 2017-08-03 DIAGNOSIS — G8929 Other chronic pain: Secondary | ICD-10-CM

## 2017-08-16 ENCOUNTER — Ambulatory Visit
Admission: RE | Admit: 2017-08-16 | Discharge: 2017-08-16 | Disposition: A | Discharge status: Home / Self Care | Payer: Medicare Other | Source: Ambulatory Visit | Attending: Neurosurgery | Admitting: Neurosurgery

## 2017-08-16 VITALS — BP 154/84 | HR 66

## 2017-08-16 DIAGNOSIS — G8929 Other chronic pain: Secondary | ICD-10-CM

## 2017-08-16 DIAGNOSIS — M48061 Spinal stenosis, lumbar region without neurogenic claudication: Secondary | ICD-10-CM | POA: Diagnosis not present

## 2017-08-16 DIAGNOSIS — M545 Low back pain: Secondary | ICD-10-CM

## 2017-08-16 DIAGNOSIS — M48062 Spinal stenosis, lumbar region with neurogenic claudication: Secondary | ICD-10-CM

## 2017-08-16 DIAGNOSIS — R29898 Other symptoms and signs involving the musculoskeletal system: Secondary | ICD-10-CM

## 2017-08-16 MED ORDER — IOPAMIDOL (ISOVUE-M 200) INJECTION 41%
15.0000 mL | Freq: Once | INTRAMUSCULAR | Status: AC
  Administered 2017-08-16: 15 mL via INTRATHECAL

## 2017-08-16 MED ORDER — DIAZEPAM 5 MG PO TABS
5.0000 mg | ORAL_TABLET | Freq: Once | ORAL | Status: AC
  Administered 2017-08-16: 5 mg via ORAL

## 2017-08-16 NOTE — Discharge Instructions (Signed)

## 2017-08-23 DIAGNOSIS — M4800 Spinal stenosis, site unspecified: Secondary | ICD-10-CM | POA: Diagnosis not present

## 2017-08-23 DIAGNOSIS — M48062 Spinal stenosis, lumbar region with neurogenic claudication: Secondary | ICD-10-CM | POA: Diagnosis not present

## 2017-08-26 ENCOUNTER — Other Ambulatory Visit: Payer: Self-pay | Admitting: Neurosurgery

## 2017-09-07 NOTE — Pre-Procedure Instructions (Signed)
[name]  09/07/2017      [facility], [place] [address] Phone: [phone] Fax: [place], [name] [address] Phone: [phone] Fax: [phone]    Your procedure is scheduled on October 29  Report to [facility] at [facility].  Call this number if you have problems the morning of surgery:  [phone]   Remember:  Do not eat food or drink liquids after midnight.  Continue all other medications as directed by your physician except follow these medication instructions before surgery   Take these medicines the morning of surgery with A SIP OF WATER  oxyCODONE-acetaminophen (PERCOCET/ROXICET)  7 days prior to surgery STOP taking any Aspirin (unless otherwise instructed by your surgeon), Aleve, Naproxen, Ibuprofen, Motrin, Advil, Goody's, BC's, all herbal medications, fish oil, and all vitamins    Do not wear jewelry, make-up or nail polish.  Do not wear lotions, powders, or perfumes, or deoderant.  Do not shave 48 hours prior to surgery.  Men may shave face and neck.  Do not bring valuables to the hospital.  [facility] is not responsible for any belongings or valuables.  Contacts, dentures or bridgework may not be worn into surgery.  Leave your suitcase in the car.  After surgery it may be brought to your room.  For patients admitted to the hospital, discharge time will be determined by your treatment team.  Patients discharged the day of surgery will not be allowed to drive home.    Special instructions:   [facility]- Preparing For Surgery  Before surgery, you can play an important role. Because skin is not sterile, your skin needs to be as free of germs as possible. You can reduce the number of germs on your skin by washing with CHG (chlorahexidine gluconate) Soap before surgery.  CHG is an  antiseptic cleaner which kills germs and bonds with the skin to continue killing germs even after washing.  Please do not use if you have an allergy to CHG or antibacterial soaps. If your skin becomes reddened/irritated stop using the CHG.  Do not shave (including legs and underarms) for at least 48 hours prior to first CHG shower. It is OK to shave your face.  Please follow these instructions carefully.   1. Shower the NIGHT BEFORE SURGERY and the MORNING OF SURGERY with CHG.   2. If you chose to wash your hair, wash your hair first as usual with your normal shampoo.  3. After you shampoo, rinse your hair and body thoroughly to remove the shampoo.  4. Use CHG as you would any other liquid soap. You can apply CHG directly to the skin and wash gently with a scrungie or a clean washcloth.   5. Apply the CHG Soap to your body ONLY FROM THE NECK DOWN.  Do not use on open wounds or open sores. Avoid contact with your eyes, ears, mouth and genitals (private parts). Wash Face and genitals (private parts)  with your normal soap.  6. Wash thoroughly, paying special attention to the area where your surgery will be performed.  7. Thoroughly rinse your body with warm water from the neck down.  8. DO NOT shower/wash with your normal soap after using and rinsing off the CHG Soap.  9. Pat yourself dry with a CLEAN TOWEL.  10. Wear CLEAN PAJAMAS  to bed the night before surgery, wear comfortable clothes the morning of surgery  11. Place CLEAN SHEETS on your bed the night of your first shower and DO NOT SLEEP WITH PETS.    Day of Surgery: Do not apply any deodorants/lotions. Please wear clean clothes to the hospital/surgery center.      Please read over the following fact sheets that you were given.

## 2017-09-08 ENCOUNTER — Encounter (HOSPITAL_COMMUNITY): Payer: Self-pay

## 2017-09-08 ENCOUNTER — Encounter (HOSPITAL_COMMUNITY)
Admission: RE | Admit: 2017-09-08 | Discharge: 2017-09-08 | Disposition: A | Discharge status: Home / Self Care | Payer: Medicare Other | Source: Ambulatory Visit | Attending: Neurosurgery | Admitting: Neurosurgery

## 2017-09-08 DIAGNOSIS — R9431 Abnormal electrocardiogram [ECG] [EKG]: Secondary | ICD-10-CM | POA: Insufficient documentation

## 2017-09-08 DIAGNOSIS — Z01812 Encounter for preprocedural laboratory examination: Secondary | ICD-10-CM | POA: Insufficient documentation

## 2017-09-08 DIAGNOSIS — Z01810 Encounter for preprocedural cardiovascular examination: Secondary | ICD-10-CM | POA: Diagnosis not present

## 2017-09-08 DIAGNOSIS — I10 Essential (primary) hypertension: Secondary | ICD-10-CM | POA: Diagnosis not present

## 2017-09-08 LAB — BASIC METABOLIC PANEL
Anion gap: 9 (ref 5–15)
BUN: 16 mg/dL (ref 6–20)
CHLORIDE: 104 mmol/L (ref 101–111)
CO2: 23 mmol/L (ref 22–32)
Calcium: 8.7 mg/dL — ABNORMAL LOW (ref 8.9–10.3)
Creatinine, Ser: 0.81 mg/dL (ref 0.44–1.00)
GFR calc Af Amer: >60 mL/min (ref >60)
GFR calc non Af Amer: >60 mL/min (ref >60)
GLUCOSE: 102 mg/dL — AB (ref 65–99)
POTASSIUM: 4 mmol/L (ref 3.5–5.1)
Sodium: 136 mmol/L (ref 135–145)

## 2017-09-08 LAB — TYPE AND SCREEN
ABO/RH(D): O POS
ANTIBODY SCREEN: NEGATIVE

## 2017-09-08 LAB — CBC
HCT: 37.6 % (ref 36.0–46.0)
HEMOGLOBIN: 11.9 g/dL — AB (ref 12.0–15.0)
MCH: 26.7 pg (ref 26.0–34.0)
MCHC: 31.6 g/dL (ref 30.0–36.0)
MCV: 84.5 fL (ref 78.0–100.0)
Platelets: 220 10*3/uL (ref 150–400)
RBC: 4.45 MIL/uL (ref 3.87–5.11)
RDW: 15.1 % (ref 11.5–15.5)
WBC: 5.2 10*3/uL (ref 4.0–10.5)

## 2017-09-08 LAB — SURGICAL PCR SCREEN
MRSA, PCR: NEGATIVE
Staphylococcus aureus: POSITIVE — AB

## 2017-09-08 NOTE — Progress Notes (Signed)
Notified pt's husband that pt needs to pick up Rx for Mupirocin today and get started on it tonight. He states he will try to get to they pharmacy before they close today.

## 2017-09-13 ENCOUNTER — Inpatient Hospital Stay (HOSPITAL_COMMUNITY): Payer: Medicare Other | Admitting: Critical Care Medicine

## 2017-09-13 ENCOUNTER — Inpatient Hospital Stay (HOSPITAL_COMMUNITY)
Admission: RE | Admit: 2017-09-13 | Discharge: 2017-09-14 | DRG: 454 | Disposition: A | Discharge status: Home / Self Care | Payer: Medicare Other | Source: Ambulatory Visit | Attending: Neurosurgery | Admitting: Neurosurgery

## 2017-09-13 ENCOUNTER — Encounter (HOSPITAL_COMMUNITY): Payer: Self-pay | Admitting: Critical Care Medicine

## 2017-09-13 ENCOUNTER — Encounter (HOSPITAL_COMMUNITY)
Admission: RE | Disposition: A | Discharge status: Home / Self Care | Payer: Self-pay | Source: Ambulatory Visit | Attending: Neurosurgery

## 2017-09-13 ENCOUNTER — Inpatient Hospital Stay (HOSPITAL_COMMUNITY): Payer: Medicare Other

## 2017-09-13 DIAGNOSIS — Z885 Allergy status to narcotic agent status: Secondary | ICD-10-CM | POA: Diagnosis not present

## 2017-09-13 DIAGNOSIS — Z6835 Body mass index (BMI) 35.0-35.9, adult: Secondary | ICD-10-CM

## 2017-09-13 DIAGNOSIS — M5116 Intervertebral disc disorders with radiculopathy, lumbar region: Secondary | ICD-10-CM | POA: Diagnosis present

## 2017-09-13 DIAGNOSIS — M48062 Spinal stenosis, lumbar region with neurogenic claudication: Secondary | ICD-10-CM | POA: Diagnosis not present

## 2017-09-13 DIAGNOSIS — Z8249 Family history of ischemic heart disease and other diseases of the circulatory system: Secondary | ICD-10-CM

## 2017-09-13 DIAGNOSIS — M545 Low back pain: Secondary | ICD-10-CM | POA: Diagnosis not present

## 2017-09-13 DIAGNOSIS — Z888 Allergy status to other drugs, medicaments and biological substances status: Secondary | ICD-10-CM

## 2017-09-13 DIAGNOSIS — Z419 Encounter for procedure for purposes other than remedying health state, unspecified: Secondary | ICD-10-CM

## 2017-09-13 DIAGNOSIS — M960 Pseudarthrosis after fusion or arthrodesis: Secondary | ICD-10-CM | POA: Diagnosis not present

## 2017-09-13 DIAGNOSIS — I10 Essential (primary) hypertension: Secondary | ICD-10-CM | POA: Diagnosis not present

## 2017-09-13 DIAGNOSIS — M5136 Other intervertebral disc degeneration, lumbar region: Secondary | ICD-10-CM | POA: Diagnosis not present

## 2017-09-13 DIAGNOSIS — M4326 Fusion of spine, lumbar region: Secondary | ICD-10-CM | POA: Diagnosis not present

## 2017-09-13 DIAGNOSIS — M549 Dorsalgia, unspecified: Secondary | ICD-10-CM | POA: Diagnosis present

## 2017-09-13 DIAGNOSIS — M4807 Spinal stenosis, lumbosacral region: Secondary | ICD-10-CM | POA: Diagnosis present

## 2017-09-13 SURGERY — POSTERIOR LUMBAR FUSION 1 LEVEL
Anesthesia: General

## 2017-09-13 MED ORDER — THROMBIN (RECOMBINANT) 5000 UNITS EX SOLR
OROMUCOSAL | Status: DC | PRN
  Administered 2017-09-13: 19:00:00 via TOPICAL

## 2017-09-13 MED ORDER — ROCURONIUM BROMIDE 100 MG/10ML IV SOLN
INTRAVENOUS | Status: DC | PRN
  Administered 2017-09-13: 50 mg via INTRAVENOUS
  Administered 2017-09-13 (×2): 20 mg via INTRAVENOUS

## 2017-09-13 MED ORDER — FENTANYL CITRATE (PF) 250 MCG/5ML IJ SOLN
INTRAMUSCULAR | Status: AC
Start: 2017-09-13 — End: 2017-09-13
  Filled 2017-09-13: qty 5

## 2017-09-13 MED ORDER — MENTHOL 3 MG MT LOZG: 1.0000 | LOZENGE | OROMUCOSAL | Status: DC | PRN

## 2017-09-13 MED ORDER — OXYCODONE HCL 5 MG PO TABS
5.0000 mg | ORAL_TABLET | ORAL | Status: DC | PRN
  Administered 2017-09-13 – 2017-09-14 (×4): 5 mg via ORAL
  Filled 2017-09-13 (×4): qty 1

## 2017-09-13 MED ORDER — 0.9 % SODIUM CHLORIDE (POUR BTL) OPTIME
TOPICAL | Status: DC | PRN
  Administered 2017-09-13: 1000 mL

## 2017-09-13 MED ORDER — DEXAMETHASONE SODIUM PHOSPHATE 4 MG/ML IJ SOLN
INTRAMUSCULAR | Status: DC | PRN
  Administered 2017-09-13: 8 mg via INTRAVENOUS

## 2017-09-13 MED ORDER — BACITRACIN ZINC 500 UNIT/GM EX OINT
TOPICAL_OINTMENT | CUTANEOUS | Status: DC | PRN
  Administered 2017-09-13: 1 via TOPICAL

## 2017-09-13 MED ORDER — MORPHINE SULFATE (PF) 4 MG/ML IV SOLN: 4.0000 mg | INTRAVENOUS | Status: DC | PRN

## 2017-09-13 MED ORDER — HYDROMORPHONE HCL 1 MG/ML IJ SOLN
INTRAMUSCULAR | Status: AC
  Filled 2017-09-13: qty 1

## 2017-09-13 MED ORDER — OXYCODONE-ACETAMINOPHEN 5-325 MG PO TABS
ORAL_TABLET | ORAL | Status: AC
  Filled 2017-09-13: qty 2

## 2017-09-13 MED ORDER — HYDROMORPHONE HCL 1 MG/ML IJ SOLN: 0.2500 mg | INTRAMUSCULAR | Status: DC | PRN

## 2017-09-13 MED ORDER — PROPOFOL 10 MG/ML IV BOLUS
INTRAVENOUS | Status: AC
  Filled 2017-09-13: qty 20

## 2017-09-13 MED ORDER — THROMBIN (RECOMBINANT) 20000 UNITS EX SOLR
CUTANEOUS | Status: AC
  Filled 2017-09-13: qty 20000

## 2017-09-13 MED ORDER — SODIUM CHLORIDE 0.9 % IR SOLN
Status: DC | PRN
  Administered 2017-09-13: 19:00:00

## 2017-09-13 MED ORDER — PHENYLEPHRINE HCL 10 MG/ML IJ SOLN
INTRAVENOUS | Status: DC | PRN
  Administered 2017-09-13: 10 ug/min via INTRAVENOUS

## 2017-09-13 MED ORDER — ACETAMINOPHEN 325 MG PO TABS
650.0000 mg | ORAL_TABLET | ORAL | Status: DC | PRN
Start: 2017-09-13 — End: 2017-09-14
  Administered 2017-09-14 (×2): 650 mg via ORAL
  Filled 2017-09-13 (×2): qty 2

## 2017-09-13 MED ORDER — CEFAZOLIN SODIUM-DEXTROSE 2-4 GM/100ML-% IV SOLN
INTRAVENOUS | Status: AC
  Filled 2017-09-13: qty 100

## 2017-09-13 MED ORDER — THROMBIN (RECOMBINANT) 5000 UNITS EX SOLR
CUTANEOUS | Status: AC
  Filled 2017-09-13: qty 5000

## 2017-09-13 MED ORDER — SODIUM CHLORIDE 0.9% FLUSH: 3.0000 mL | Freq: Two times a day (BID) | INTRAVENOUS | Status: DC

## 2017-09-13 MED ORDER — PHENOL 1.4 % MT LIQD: 1.0000 | OROMUCOSAL | Status: DC | PRN

## 2017-09-13 MED ORDER — LIDOCAINE HCL (CARDIAC) 20 MG/ML IV SOLN
INTRAVENOUS | Status: DC | PRN
  Administered 2017-09-13: 60 mg via INTRAVENOUS

## 2017-09-13 MED ORDER — ZOLPIDEM TARTRATE 5 MG PO TABS: 5.0000 mg | ORAL_TABLET | Freq: Every evening | ORAL | Status: DC | PRN

## 2017-09-13 MED ORDER — BUPIVACAINE-EPINEPHRINE (PF) 0.5% -1:200000 IJ SOLN
INTRAMUSCULAR | Status: DC | PRN
  Administered 2017-09-13: 10 mL

## 2017-09-13 MED ORDER — BISACODYL 10 MG RE SUPP: 10.0000 mg | Freq: Every day | RECTAL | Status: DC | PRN

## 2017-09-13 MED ORDER — VANCOMYCIN HCL 1000 MG IV SOLR
INTRAVENOUS | Status: DC | PRN
  Administered 2017-09-13: 1000 mg

## 2017-09-13 MED ORDER — SODIUM CHLORIDE 0.9% FLUSH: 3.0000 mL | INTRAVENOUS | Status: DC | PRN

## 2017-09-13 MED ORDER — MIDAZOLAM HCL 2 MG/2ML IJ SOLN
INTRAMUSCULAR | Status: AC
  Filled 2017-09-13: qty 2

## 2017-09-13 MED ORDER — BACITRACIN ZINC 500 UNIT/GM EX OINT
TOPICAL_OINTMENT | CUTANEOUS | Status: AC
  Filled 2017-09-13: qty 28.35

## 2017-09-13 MED ORDER — BUPIVACAINE LIPOSOME 1.3 % IJ SUSP
INTRAMUSCULAR | Status: DC | PRN
  Administered 2017-09-13: 20 mL

## 2017-09-13 MED ORDER — SUGAMMADEX SODIUM 200 MG/2ML IV SOLN
INTRAVENOUS | Status: DC | PRN
  Administered 2017-09-13: 200 mg via INTRAVENOUS

## 2017-09-13 MED ORDER — SODIUM CHLORIDE 0.9 % IV SOLN: 250.0000 mL | INTRAVENOUS | Status: DC

## 2017-09-13 MED ORDER — LACTATED RINGERS IV SOLN
INTRAVENOUS | Status: DC
  Administered 2017-09-13 (×3): via INTRAVENOUS

## 2017-09-13 MED ORDER — ACETAMINOPHEN 650 MG RE SUPP: 650.0000 mg | RECTAL | Status: DC | PRN

## 2017-09-13 MED ORDER — FENTANYL CITRATE (PF) 100 MCG/2ML IJ SOLN
INTRAMUSCULAR | Status: DC | PRN
  Administered 2017-09-13: 100 ug via INTRAVENOUS
  Administered 2017-09-13 (×3): 50 ug via INTRAVENOUS
  Administered 2017-09-13: 100 ug via INTRAVENOUS
  Administered 2017-09-13: 50 ug via INTRAVENOUS

## 2017-09-13 MED ORDER — VANCOMYCIN HCL 1000 MG IV SOLR
INTRAVENOUS | Status: AC
  Filled 2017-09-13: qty 1000

## 2017-09-13 MED ORDER — CHLORHEXIDINE GLUCONATE CLOTH 2 % EX PADS: 6.0000 | MEDICATED_PAD | Freq: Once | CUTANEOUS | Status: DC

## 2017-09-13 MED ORDER — BUPIVACAINE-EPINEPHRINE (PF) 0.5% -1:200000 IJ SOLN
INTRAMUSCULAR | Status: AC
  Filled 2017-09-13: qty 30

## 2017-09-13 MED ORDER — ONDANSETRON HCL 4 MG PO TABS: 4.0000 mg | ORAL_TABLET | Freq: Four times a day (QID) | ORAL | Status: DC | PRN

## 2017-09-13 MED ORDER — HYDROMORPHONE HCL 1 MG/ML IJ SOLN
0.2500 mg | INTRAMUSCULAR | Status: DC | PRN
  Administered 2017-09-13: 0.5 mg via INTRAVENOUS

## 2017-09-13 MED ORDER — HYDROMORPHONE HCL 1 MG/ML IJ SOLN
INTRAMUSCULAR | Status: AC
  Administered 2017-09-13 (×2): 0.5 mg
  Filled 2017-09-13: qty 1

## 2017-09-13 MED ORDER — ONDANSETRON HCL 4 MG/2ML IJ SOLN: 4.0000 mg | Freq: Four times a day (QID) | INTRAMUSCULAR | Status: DC | PRN

## 2017-09-13 MED ORDER — FENTANYL CITRATE (PF) 250 MCG/5ML IJ SOLN
INTRAMUSCULAR | Status: AC
  Filled 2017-09-13: qty 5

## 2017-09-13 MED ORDER — CEFAZOLIN SODIUM-DEXTROSE 2-4 GM/100ML-% IV SOLN
2.0000 g | INTRAVENOUS | Status: AC
  Administered 2017-09-13: 2 g via INTRAVENOUS

## 2017-09-13 MED ORDER — OXYCODONE HCL 5 MG PO TABS: 10.0000 mg | ORAL_TABLET | ORAL | Status: DC | PRN

## 2017-09-13 MED ORDER — PROPOFOL 10 MG/ML IV BOLUS
INTRAVENOUS | Status: DC | PRN
  Administered 2017-09-13: 40 mg via INTRAVENOUS
  Administered 2017-09-13: 160 mg via INTRAVENOUS

## 2017-09-13 MED ORDER — BUPIVACAINE LIPOSOME 1.3 % IJ SUSP
20.0000 mL | INTRAMUSCULAR | Status: DC
  Filled 2017-09-13: qty 20

## 2017-09-13 MED ORDER — CYCLOBENZAPRINE HCL 10 MG PO TABS
10.0000 mg | ORAL_TABLET | Freq: Three times a day (TID) | ORAL | Status: DC | PRN
  Administered 2017-09-13: 10 mg via ORAL
  Filled 2017-09-13: qty 1

## 2017-09-13 MED ORDER — DOCUSATE SODIUM 100 MG PO CAPS
100.0000 mg | ORAL_CAPSULE | Freq: Two times a day (BID) | ORAL | Status: DC
  Administered 2017-09-13: 100 mg via ORAL
  Filled 2017-09-13: qty 1

## 2017-09-13 MED ORDER — CEFAZOLIN SODIUM-DEXTROSE 2-4 GM/100ML-% IV SOLN
2.0000 g | Freq: Three times a day (TID) | INTRAVENOUS | Status: AC
  Administered 2017-09-13 – 2017-09-14 (×2): 2 g via INTRAVENOUS
  Filled 2017-09-13 (×2): qty 100

## 2017-09-13 MED ORDER — THROMBIN (RECOMBINANT) 20000 UNITS EX SOLR
CUTANEOUS | Status: DC | PRN
  Administered 2017-09-13: 19:00:00 via TOPICAL

## 2017-09-13 MED ORDER — OXYCODONE-ACETAMINOPHEN 5-325 MG PO TABS: 2.0000 | ORAL_TABLET | Freq: Once | ORAL | Status: DC

## 2017-09-13 MED ORDER — ONDANSETRON HCL 4 MG/2ML IJ SOLN
INTRAMUSCULAR | Status: DC | PRN
  Administered 2017-09-13: 4 mg via INTRAVENOUS

## 2017-09-13 SURGICAL SUPPLY — 73 items
APL SKNCLS STERI-STRIP NONHPOA (GAUZE/BANDAGES/DRESSINGS) ×1
BAG DECANTER FOR FLEXI CONT (MISCELLANEOUS) ×2 IMPLANT
BASKET BONE COLLECTION (BASKET) ×1 IMPLANT
BENZOIN TINCTURE PRP APPL 2/3 (GAUZE/BANDAGES/DRESSINGS) ×2 IMPLANT
BUR MATCHSTICK NEURO 3.0 LAGG (BURR) ×2 IMPLANT
BUR PRECISION FLUTE 6.0 (BURR) ×2 IMPLANT
CAGE ALTERA 10X31X9-13 15D (Cage) ×1 IMPLANT
CANISTER SUCT 3000ML PPV (MISCELLANEOUS) ×2 IMPLANT
CAP REVERE LOCKING (Cap) ×10 IMPLANT
CARTRIDGE OIL MAESTRO DRILL (MISCELLANEOUS) ×1 IMPLANT
CLSR STERI-STRIP ANTIMIC 1/2X4 (GAUZE/BANDAGES/DRESSINGS) ×1 IMPLANT
CONN CROSSLINK REV 38-50MM (Connector) ×2 IMPLANT
CONNECTOR CRSLINK REV 38-50MM (Connector) IMPLANT
CONT SPEC 4OZ CLIKSEAL STRL BL (MISCELLANEOUS) ×2 IMPLANT
COVER BACK TABLE 60X90IN (DRAPES) ×4 IMPLANT
DIFFUSER DRILL AIR PNEUMATIC (MISCELLANEOUS) ×2 IMPLANT
DRAPE C-ARM 42X72 X-RAY (DRAPES) ×4 IMPLANT
DRAPE HALF SHEET 40X57 (DRAPES) ×3 IMPLANT
DRAPE LAPAROTOMY 100X72X124 (DRAPES) ×2 IMPLANT
DRAPE POUCH INSTRU U-SHP 10X18 (DRAPES) ×2 IMPLANT
DRAPE SURG 17X23 STRL (DRAPES) ×8 IMPLANT
ELECT BLADE 4.0 EZ CLEAN MEGAD (MISCELLANEOUS) ×2
ELECT REM PT RETURN 9FT ADLT (ELECTROSURGICAL) ×2
ELECTRODE BLDE 4.0 EZ CLN MEGD (MISCELLANEOUS) ×1 IMPLANT
ELECTRODE REM PT RTRN 9FT ADLT (ELECTROSURGICAL) ×1 IMPLANT
EVACUATOR 1/8 PVC DRAIN (DRAIN) IMPLANT
GAUZE SPONGE 4X4 12PLY STRL (GAUZE/BANDAGES/DRESSINGS) ×2 IMPLANT
GAUZE SPONGE 4X4 16PLY XRAY LF (GAUZE/BANDAGES/DRESSINGS) ×3 IMPLANT
GLOVE BIO SURGEON STRL SZ7 (GLOVE) ×1 IMPLANT
GLOVE BIO SURGEON STRL SZ8 (GLOVE) ×4 IMPLANT
GLOVE BIO SURGEON STRL SZ8.5 (GLOVE) ×4 IMPLANT
GLOVE BIOGEL PI IND STRL 6.5 (GLOVE) IMPLANT
GLOVE BIOGEL PI IND STRL 7.0 (GLOVE) IMPLANT
GLOVE BIOGEL PI IND STRL 7.5 (GLOVE) IMPLANT
GLOVE BIOGEL PI INDICATOR 6.5 (GLOVE) ×2
GLOVE BIOGEL PI INDICATOR 7.0 (GLOVE) ×2
GLOVE BIOGEL PI INDICATOR 7.5 (GLOVE) ×3
GLOVE EXAM NITRILE LRG STRL (GLOVE) IMPLANT
GLOVE EXAM NITRILE XL STR (GLOVE) IMPLANT
GLOVE EXAM NITRILE XS STR PU (GLOVE) IMPLANT
GLOVE SS N UNI LF 6.5 STRL (GLOVE) ×1 IMPLANT
GLOVE SS N UNI LF 7.0 STRL (GLOVE) ×1 IMPLANT
GOWN STRL REUS W/ TWL LRG LVL3 (GOWN DISPOSABLE) IMPLANT
GOWN STRL REUS W/ TWL XL LVL3 (GOWN DISPOSABLE) ×2 IMPLANT
GOWN STRL REUS W/TWL 2XL LVL3 (GOWN DISPOSABLE) IMPLANT
GOWN STRL REUS W/TWL LRG LVL3 (GOWN DISPOSABLE) ×4
GOWN STRL REUS W/TWL XL LVL3 (GOWN DISPOSABLE) ×4
KIT BASIN OR (CUSTOM PROCEDURE TRAY) ×2 IMPLANT
KIT ROOM TURNOVER OR (KITS) ×2 IMPLANT
NDL HYPO 21X1.5 SAFETY (NEEDLE) IMPLANT
NEEDLE HYPO 21X1.5 SAFETY (NEEDLE) IMPLANT
NEEDLE HYPO 22GX1.5 SAFETY (NEEDLE) ×2 IMPLANT
NS IRRIG 1000ML POUR BTL (IV SOLUTION) ×2 IMPLANT
OIL CARTRIDGE MAESTRO DRILL (MISCELLANEOUS) ×2
PACK LAMINECTOMY NEURO (CUSTOM PROCEDURE TRAY) ×2 IMPLANT
PAD ARMBOARD 7.5X6 YLW CONV (MISCELLANEOUS) ×6 IMPLANT
PATTIES SURGICAL .5 X1 (DISPOSABLE) IMPLANT
PATTIES SURGICAL 1X1 (DISPOSABLE) ×1 IMPLANT
ROD REVERE 6.35 CURVED 125MM (Rod) ×2 IMPLANT
SCREW REVERE 6.35 6.5MMX45 (Screw) ×2 IMPLANT
SPONGE LAP 4X18 X RAY DECT (DISPOSABLE) IMPLANT
SPONGE NEURO XRAY DETECT 1X3 (DISPOSABLE) IMPLANT
SPONGE SURGIFOAM ABS GEL 100 (HEMOSTASIS) ×2 IMPLANT
STRIP BIOACTIVE 20CC 25X100X8 (Miscellaneous) ×1 IMPLANT
STRIP CLOSURE SKIN 1/2X4 (GAUZE/BANDAGES/DRESSINGS) ×2 IMPLANT
SUT VIC AB 1 CT1 18XBRD ANBCTR (SUTURE) ×2 IMPLANT
SUT VIC AB 1 CT1 8-18 (SUTURE) ×4
SUT VIC AB 2-0 CP2 18 (SUTURE) ×5 IMPLANT
TAPE CLOTH SURG 4X10 WHT LF (GAUZE/BANDAGES/DRESSINGS) ×1 IMPLANT
TOWEL GREEN STERILE (TOWEL DISPOSABLE) ×2 IMPLANT
TOWEL GREEN STERILE FF (TOWEL DISPOSABLE) ×2 IMPLANT
TRAY FOLEY W/METER SILVER 16FR (SET/KITS/TRAYS/PACK) ×2 IMPLANT
WATER STERILE IRR 1000ML POUR (IV SOLUTION) ×2 IMPLANT

## 2017-09-13 NOTE — Progress Notes (Signed)
Pt. Resting comfortably. NO complaints offered.

## 2017-09-13 NOTE — Anesthesia Preprocedure Evaluation (Signed)
Anesthesia Evaluation  Patient identified by MRN, date of birth, ID band Patient awake    Reviewed: Allergy & Precautions, NPO status , Patient's Chart, lab work & pertinent test results  History of Anesthesia Complications (+) PONV and history of anesthetic complications  Airway Mallampati: I  TM Distance: >3 FB Neck ROM: Full    Dental  (+) Edentulous Upper, Edentulous Lower   Pulmonary neg pulmonary ROS,    breath sounds clear to auscultation       Cardiovascular hypertension, (-) angina Rhythm:Regular Rate:Normal     Neuro/Psych Back pain: tylenol    GI/Hepatic negative GI ROS, Neg liver ROS,   Endo/Other  Morbid obesity  Renal/GU negative Renal ROS     Musculoskeletal  (+) Arthritis , Osteoarthritis,    Abdominal (+) + obese,   Peds  Hematology negative hematology ROS (+)   Anesthesia Other Findings   Reproductive/Obstetrics                             Anesthesia Physical  Anesthesia Plan  ASA: III  Anesthesia Plan: General   Post-op Pain Management:    Induction: Intravenous  PONV Risk Score and Plan: 3 and Ondansetron, Dexamethasone, Midazolam and Treatment may vary due to age or medical condition  Airway Management Planned: Oral ETT  Additional Equipment:   Intra-op Plan:   Post-operative Plan: Extubation in OR  Informed Consent: I have reviewed the patients History and Physical, chart, labs and discussed the procedure including the risks, benefits and alternatives for the proposed anesthesia with the patient or authorized representative who has indicated his/her understanding and acceptance.     Plan Discussed with: CRNA and Surgeon  Anesthesia Plan Comments: (Plan routine monitors, GETA)        Anesthesia Quick Evaluation

## 2017-09-13 NOTE — H&P (Signed)
In my dictation I forgot to mention that we are planning to do a redo L4-5 and L5-S1 laminectomy as well.

## 2017-09-13 NOTE — Op Note (Signed)
Brief history: The patient is a 71 year old white female on whom I previously performed lumbar fusions. She has developed recurrent back, buttock and leg pain consistent with neurogenic claudication. She has failed medical management and was worked up with a lumbar myelo CT. This demonstrated the patient had severe stenosis at L2-3 with moderate stenosis at L3-4 and L4-5. I discussed the various treatment options with the patient and her husband. She has decided to proceed with surgery after weighing the risks, benefits and alternatives.  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 Etc. requiring a wide decompression of the nerve roots.); L2-3 transforaminal lumbar interbody fusion with local morselized autograft bone and Kinnex graft extender; insertion of interbody prosthesis at L2-3 (globus peek expandable interbody prosthesis); posterior segmental instrumentation from L2 to S1 with globus titanium pedicle screws and rods; posterior lateral arthrodesis at L2-3 and L3-4 with local morselized autograft bone and Kinnex bone graft extender; exploration of lumbar fusion/removal of old hardware.  Surgeon: Dr. Earle Gell  Asst.: None  Anesthesia: Gen. endotracheal  Estimated blood loss: 250 mL  Drains: None  Complications: None  Description of procedure: The patient was brought to the operating room by the anesthesia team. General endotracheal anesthesia was  induced. The patient was turned to the prone position on the Wilson frame. The patient's lumbosacral region was then prepared with Betadine scrub and Betadine solution. Sterile drapes were applied.  I then injected the area to be incised with Marcaine with epinephrine solution. I then used the scalpel to make a linear midline incision over the L2-3, L3-4, L4-5 and L5-S1 interspace. I then used electrocautery to perform a bilateral subperiosteal dissection exposing the spinous process and lamina of L2, L3, L4, L5 and the upper sacrum. We determined our levels based on the old hardware We then inserted the Verstrac retractor to provide exposure.  We began by exploring the arthrodesis. I removed the caps from the old screws and then remove the rods. The patient appeared to have a good arthrodesis at L4-5 and L5-S1. I was not sure at L3-4.  I began the decompression by using the above Leksell to remove the spinous processes of L4 and L5. We then used the high speed drill to perform laminotomies at L2-3, and redo laminotomies at L4-5 and L5-S1. We then used the Kerrison punches to complete the laminectomy at L4-5 and L5-S1 and to widen the bilateral laminotomy at L2-3 and removed the ligamentum flavum at L2-3. We used the Kerrison punches to remove the medial facets at L2-3, L4-5 and L5-S1. We performed wide foraminotomies about the bilateral L2, L3, L4, L5 and S1 nerve roots completing the decompression.  We now turned our attention to the posterior lumbar interbody fusion. I used a scalpel to incise the intervertebral disc at L2-3 bilaterally. I then performed a partial intervertebral discectomy at L2-3 bilaterally using the pituitary forceps. We prepared the vertebral endplates at G9-5 bilaterally for the fusion by removing the soft tissues with the curettes. We then used the trial spacers to pick the appropriate sized interbody  prosthesis. We prefilled his prosthesis with a combination of local morselized  autograft bone that we obtained during the decompression as well as Kinnex bone graft extender. We inserted the prefilled prosthesis into the interspace at L2-3 we then expanded the prosthesis. There was a good snug fit of the prosthesis in the interspace. We then filled and the remainder of the intervertebral disc space with local morselized autograft bone and Kinnex. This completed the posterior lumbar interbody arthrodesis.  We now turned attention to the instrumentation. Under fluoroscopic guidance we cannulated the bilateral L2 pedicles with the bone probe. We then removed the bone probe. We then tapped the pedicle with a 5.5 millimeter tap. We then removed the tap. We probed inside the tapped pedicle with a ball probe to rule out cortical breaches. We then inserted a 6.5 x 45 millimeter pedicle screw into the L2 pedicles bilaterally under fluoroscopic guidance. We then palpated along the medial aspect of the pedicles to rule out cortical breaches. There were none. The nerve roots were not injured. We then connected the unilateral pedicle screws from L2-S1 bilaterally with  lordotic rods. We compressed the construct and secured the rod in place with the caps. We then tightened the caps appropriately. This completed the instrumentation from L2-S1 bilaterally.  We now turned our attention to the posterior lateral arthrodesis at L2-3 and L3-4. We used the high-speed drill to decorticate the remainder of the facets, pars, transverse process at L2-3 and L3-4. We then applied a combination of local morselized autograft bone and Kinnex bone graft extender over these decorticated posterior lateral structures. This completed the bilateral posterior lateral arthrodesis at L2-3 and L3-4.  We then obtained hemostasis using bipolar electrocautery. We irrigated the wound out with bacitracin solution. We inspected the thecal sac and nerve roots and noted they were well decompressed. We then removed the retractor. We  placed vancomycin powder in the wound. We reapproximated patient's thoracolumbar fascia with interrupted #1 Vicryl suture. We reapproximated patient's subcutaneous tissue with interrupted 2-0 Vicryl suture. The reapproximated patient's skin with Steri-Strips and benzoin. The wound was then coated with bacitracin ointment. A sterile dressing was applied. The drapes were removed. The patient was subsequently returned to the supine position where they were extubated by the anesthesia team. He was then transported to the post anesthesia care unit in stable condition. All sponge instrument and needle counts were reportedly correct at the end of this case.

## 2017-09-13 NOTE — Progress Notes (Signed)
Subjective:  The patient is alert and pleasant. She is in no apparent distress. She looks well.  Objective: Vital signs in last 24 hours: Temp:  [97.7 F (36.5 C)] 97.7 F (36.5 C) (10/29 0849) Pulse Rate:  [78-80] 80 (10/29 1338) Resp:  [18-20] 20 (10/29 1338) BP: (160-185)/(77-79) 160/77 (10/29 1338) SpO2:  [96 %-97 %] 97 % (10/29 1338) Weight:  [95.1 kg (209 lb 9.6 oz)] 95.1 kg (209 lb 9.6 oz) (10/29 0849)  Intake/Output from previous day: No intake/output data recorded. Intake/Output this shift: Total I/O In: 1160 [I.V.:1000; Blood:160] Out: 305 [Urine:55; Blood:250]  Physical exam the patient is alert and pleasant. She is moving her lower extremities well.  Lab Results: No results for input(s): WBC, HGB, HCT, PLT in the last 72 hours. BMET No results for input(s): NA, K, CL, CO2, GLUCOSE, BUN, CREATININE, CALCIUM in the last 72 hours.  Studies/Results: No results found.  Assessment/Plan: The patient is doing well.  LOS: 0 days     Otto Caraway D 09/13/2017, 9:17 PM

## 2017-09-13 NOTE — Transfer of Care (Signed)
Immediate Anesthesia Transfer of Care Note  Patient: [name]  Procedure(s) Performed: POSTERIOR LUMBAR INTERBODY FUSION, INTERBODY PROSTHESIS, POSTERIOR INSTRUMENTATION AND FUSION, REDO LAMINECTOMY LUMBAR 2- LUMBAR 3 (N/A )  Patient Location: PACU  Anesthesia Type:General  Level of Consciousness: awake, alert , oriented and patient cooperative  Airway & Oxygen Therapy: Patient Spontanous Breathing and Patient connected to nasal cannula oxygen  Post-op Assessment: Report given to RN and Post -op Vital signs reviewed and stable  Post vital signs: Reviewed and stable  Last Vitals:  Vitals:   09/13/17 0849 09/13/17 1338  BP: (!) 185/79 (!) 160/77  Pulse: 78 80  Resp: 18 20  Temp: 36.5 C   SpO2: 96% 97%    Last Pain:  Vitals:   09/13/17 1300  TempSrc:   PainSc: 4          Complications: No apparent anesthesia complications

## 2017-09-13 NOTE — Anesthesia Procedure Notes (Signed)
Procedure Name: Intubation Date/Time: 09/13/2017 5:05 PM Performed by: Oletta Lamas Pre-anesthesia Checklist: Patient identified, Emergency Drugs available, Suction available and Patient being monitored Patient Re-evaluated:Patient Re-evaluated prior to induction Oxygen Delivery Method: Circle System Utilized Preoxygenation: Pre-oxygenation with 100% oxygen Induction Type: IV induction Ventilation: Mask ventilation without difficulty Laryngoscope Size: Miller and 2 Grade View: Grade I Tube type: Oral Tube size: 7.5 mm Number of attempts: 1 Airway Equipment and Method: Stylet and Oral airway Placement Confirmation: ETT inserted through vocal cords under direct vision,  positive ETCO2 and breath sounds checked- equal and bilateral Secured at: 22 cm Tube secured with: Tape Dental Injury: Teeth and Oropharynx as per pre-operative assessment

## 2017-09-13 NOTE — H&P (Signed)
Subjective: The patient is a 71 year old white female on whom I previously performed lumbar fusions. She has developed recurrent back and leg pain consistent with neurogenic claudication. She has failed medical management and was worked up with a lumbar MRI. This demonstrated significant stenosis at L2-3 with degenerative changes. I discussed the various treatment options with the patient including surgery. She has weighed the risks, benefits, and alternative surgery and decided to proceed with an L2-3 decompression, instrumentation and fusion.   Past Medical History:  Diagnosis Date  . Arthritis   . Hypertension    no meds in 2 yrs pt took self off  . PONV (postoperative nausea and vomiting)     Past Surgical History:  Procedure Laterality Date  . ABDOMINAL HYSTERECTOMY  82  . BACK SURGERY  2010  . BACK SURGERY  2015  . BACK SURGERY  06/2016  . CATARACT EXTRACTION W/PHACO Left 01/07/2017   Procedure: CATARACT EXTRACTION PHACO AND INTRAOCULAR LENS PLACEMENT ([facility]);  Surgeon: Tonny Branch, MD;  Location: [facility];  Service: Ophthalmology;  Laterality: Left;  left CDE 9.31   . CATARACT EXTRACTION W/PHACO Right 01/25/2017   Procedure: CATARACT EXTRACTION PHACO AND INTRAOCULAR LENS PLACEMENT ([place]) CDE:16.07;  Surgeon: Tonny Branch, MD;  Location: [facility];  Service: Ophthalmology;  Laterality: Right;  right  . FRACTURE SURGERY Right    arm  1974    Allergies  Allergen Reactions  . Hydrocodone   . Lyrica [Pregabalin]   . Neurontin [Gabapentin] Palpitations    Social History  Substance Use Topics  . Smoking status: Never Smoker  . Smokeless tobacco: Never Used  . Alcohol use No    Family History  Problem Relation Age of Onset  . Hypertension Mother    Prior to Admission medications   Medication Sig Start Date End Date Taking? Authorizing Provider  oxyCODONE-acetaminophen (PERCOCET/ROXICET) 5-325 MG tablet Take 1 tablet by mouth every 4 (four) hours as needed for severe pain.    Yes  [provider]     Review of Systems  Positive ROS: As above  All other systems have been reviewed and were otherwise negative with the exception of those mentioned in the HPI and as above.  Objective: Vital signs in last 24 hours: Temp:  [97.7 F (36.5 C)] 97.7 F (36.5 C) (10/29 0849) Pulse Rate:  [78] 78 (10/29 0849) Resp:  [18] 18 (10/29 0849) BP: (185)/(79) 185/79 (10/29 0849) SpO2:  [96 %] 96 % (10/29 0849) Weight:  [95.1 kg (209 lb 9.6 oz)] 95.1 kg (209 lb 9.6 oz) (10/29 0849)  General Appearance: Alert Head: Normocephalic, without obvious abnormality, atraumatic Eyes: PERRL, conjunctiva/corneas clear, EOM's intact,    Ears: Normal  Throat: Normal  Neck: Supple, Back: The patient's lumbar incision is well-healed. Lungs: Clear to auscultation bilaterally, respirations unlabored Heart: Regular rate and rhythm, no murmur, rub or gallop Abdomen: Soft, non-tender Extremities: Extremities normal, atraumatic, no cyanosis or edema Skin: unremarkable  NEUROLOGIC:   Mental status: alert and oriented,Motor Exam - grossly normal Sensory Exam - grossly normal Reflexes:  Coordination - grossly normal Gait - grossly normal Balance - grossly normal Cranial Nerves: I: smell Not tested  II: visual acuity  OS: Normal  OD: Normal   II: visual fields Full to confrontation  II: pupils Equal, round, reactive to light  III,VII: ptosis None  III,IV,VI: extraocular muscles  Full ROM  V: mastication Normal  V: facial light touch sensation  Normal  V,VII: corneal reflex  Present  VII: facial muscle  function - upper  Normal  VII: facial muscle function - lower Normal  VIII: hearing Not tested  IX: soft palate elevation  Normal  IX,X: gag reflex Present  XI: trapezius strength  5/5  XI: sternocleidomastoid strength 5/5  XI: neck flexion strength  5/5  XII: tongue strength  Normal    Data Review Lab Results  Component Value Date   WBC 5.2 09/08/2017   HGB 11.9 (L)  09/08/2017   HCT 37.6 09/08/2017   MCV 84.5 09/08/2017   PLT 220 09/08/2017   Lab Results  Component Value Date   NA 136 09/08/2017   K 4.0 09/08/2017   CL 104 09/08/2017   CO2 23 09/08/2017   BUN 16 09/08/2017   CREATININE 0.81 09/08/2017   GLUCOSE 102 (H) 09/08/2017   Lab Results  Component Value Date   INR 1.00 01/27/2011    Assessment/Plan: L2-3 degenerative disc disease, spinal stenosis, lumbago, lumbar radiculopathy, neurogenic claudication: I have discussed the situation with the patient. I reviewed her imaging studies with her. We have discussed the various treatment options including surgery. I have described the surgical treatment option of an L2-3 decompression, instrumentation and fusion with an expiration of her old fusion. I have given her a surgical pamphlet. I have shown her surgical models. We have discussed the risks, benefits, alternatives, expected postoperative course, and likelihood of achieving her goals with surgery. I have answered all her questions. She has decided to proceed with surgery.   Jeanelle Dake D 09/13/2017 9:38 AM

## 2017-09-14 LAB — CBC
HCT: 31.7 % — ABNORMAL LOW (ref 36.0–46.0)
HEMOGLOBIN: 10.1 g/dL — AB (ref 12.0–15.0)
MCH: 26.8 pg (ref 26.0–34.0)
MCHC: 31.9 g/dL (ref 30.0–36.0)
MCV: 84.1 fL (ref 78.0–100.0)
PLATELETS: 181 10*3/uL (ref 150–400)
RBC: 3.77 MIL/uL — ABNORMAL LOW (ref 3.87–5.11)
RDW: 15.1 % (ref 11.5–15.5)
WBC: 6.6 10*3/uL (ref 4.0–10.5)

## 2017-09-14 LAB — BASIC METABOLIC PANEL
Anion gap: 11 (ref 5–15)
BUN: 16 mg/dL (ref 6–20)
CALCIUM: 8.4 mg/dL — AB (ref 8.9–10.3)
CHLORIDE: 101 mmol/L (ref 101–111)
CO2: 23 mmol/L (ref 22–32)
CREATININE: 1.12 mg/dL — AB (ref 0.44–1.00)
GFR, EST AFRICAN AMERICAN: 56 mL/min — AB (ref >60)
GFR, EST NON AFRICAN AMERICAN: 48 mL/min — AB (ref >60)
Glucose, Bld: 149 mg/dL — ABNORMAL HIGH (ref 65–99)
Potassium: 4.5 mmol/L (ref 3.5–5.1)
SODIUM: 135 mmol/L (ref 135–145)

## 2017-09-14 MED ORDER — DOCUSATE SODIUM 100 MG PO CAPS: 100.0000 mg | ORAL_CAPSULE | Freq: Two times a day (BID) | ORAL | 0 refills | Status: DC

## 2017-09-14 MED ORDER — CYCLOBENZAPRINE HCL 10 MG PO TABS: 10.0000 mg | ORAL_TABLET | Freq: Three times a day (TID) | ORAL | 1 refills | Status: DC | PRN

## 2017-09-14 MED ORDER — OXYCODONE HCL 5 MG PO TABS: 5.0000 mg | ORAL_TABLET | ORAL | 0 refills | Status: DC | PRN

## 2017-09-14 NOTE — Discharge Instructions (Signed)

## 2017-09-14 NOTE — Progress Notes (Signed)
Pt doing well. Pt given D/C instructions with Rx's, verbal understanding was provided. Pt's IV was removed prior to D/C. Pt's incision is clean and dry with no sign of infection. Pt D/C'd home via wheelchair @ 1030 per MD order. Pt is stable @ 1030 per MD order. Holli Humbles, RN

## 2017-09-14 NOTE — Care Management Note (Signed)
Case Management Note  Patient Details  Name: [name] MRN: [id number] Date of Birth: 1946-10-16  Subjective/Objective:   Pt is s/p redo L4-5 laminectomy                 Action/Plan:  PTA independent from home with spouse - family will provide recommended supervision at discharge.  Pt has PCP and active insurance.  No CM needs determined prior to discharge   Expected Discharge Date:  09/14/17               Expected Discharge Plan:  Home/Self Care  In-House Referral:     Discharge planning Services  CM Consult  Post Acute Care Choice:    Choice offered to:     DME Arranged:    DME Agency:     HH Arranged:    HH Agency:     Status of Service:     If discussed at [employer], dates discussed:    Additional Comments:  Maryclare Labrador, RN 09/14/2017, 9:15 AM

## 2017-09-14 NOTE — Discharge Summary (Signed)
Physician Discharge Summary  Patient ID: [name] MRN: [id number] DOB/AGE: Jul 11, 1946 71 y.o.  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 The surgery went well.  The patient's postoperative course was unremarkable. She requested discharge home on postoperative day #1. She was given written and oral discharge instructions. All her questions were answered.  Consults: Physical therapy Significant Diagnostic Studies: None Treatments: L2-3, L4-5 and L5-S1 laminectomy, L2-3 and L3-4  and fusion, instrumentation L2-S1 Discharge Exam: Blood pressure 121/63, pulse 87, temperature 98.3 F (36.8 C), temperature source Oral, resp. rate 15, height 5\' 4"  (1.626 m), weight 95.1 kg (209 lb 9.6 oz), SpO2 98 %. The patient is alert and pleasant. She looks well. Her dressing is clean and dry. Her strength is normal. Her leg pain is gone.  Disposition: Home  Discharge Instructions    Call MD for:  difficulty breathing, headache or visual disturbances    Complete by:  As directed    Call MD for:  extreme fatigue    Complete by:  As directed    Call MD for:  hives    Complete by:  As directed    Call MD for:  persistant dizziness or light-headedness    Complete by:  As directed    Call MD for:  persistant nausea and vomiting    Complete by:  As directed    Call MD for:  redness, tenderness, or signs of infection (pain, swelling, redness, odor or green/yellow discharge around incision site)    Complete by:  As directed    Call MD for:  severe uncontrolled pain    Complete by:  As directed    Call MD for:  temperature >100.4    Complete by:  As directed    Diet - low sodium heart healthy    Complete by:  As directed    Discharge instructions    Complete by:  As directed    Call [phone] for a followup appointment. Take a stool softener while you are using pain medications.   Driving Restrictions    Complete by:  As directed    Do not drive for 2 weeks.   Increase activity slowly    Complete by:  As directed    Lifting restrictions    Complete by:  As directed    Do not lift more than 5 pounds. No excessive bending or twisting.   May shower / Bathe    Complete by:  As directed    He may shower after the pain she is removed 3 days after surgery. Leave the incision alone.   Remove dressing in 48 hours    Complete by:  As directed    Your stitches are under the scan and will dissolve by themselves. The Steri-Strips will fall off after you take a few showers. Do not rub back or pick at the wound, Leave the wound alone.     Allergies as of 09/14/2017      Reactions   Hydrocodone    Lyrica [pregabalin]    Neurontin [gabapentin] Palpitations      Medication List    TAKE these medications   cyclobenzaprine 10 MG tablet Commonly  known as:  FLEXERIL Take 1 tablet (10 mg total) by mouth 3 (three) times daily as needed for muscle spasms.   docusate sodium 100 MG capsule Commonly known as:  COLACE Take 1 capsule (100 mg total) by mouth 2 (two) times daily.   oxyCODONE 5 MG immediate release tablet Commonly known as:  Oxy IR/ROXICODONE Take 1 tablet (5 mg total) by mouth every 3 (three) hours as needed for moderate pain ((score 4 to 6)).   oxyCODONE-acetaminophen 5-325 MG tablet Commonly known as:  PERCOCET/ROXICET Take 1 tablet by mouth every 4 (four) hours as needed for severe pain.        SignedNewman Pies D 09/14/2017, 7:35 AM

## 2017-09-14 NOTE — Anesthesia Postprocedure Evaluation (Signed)
Anesthesia Post Note  Patient: [name]  Procedure(s) Performed: POSTERIOR LUMBAR INTERBODY FUSION, INTERBODY PROSTHESIS, POSTERIOR INSTRUMENTATION AND FUSION, REDO LAMINECTOMY LUMBAR 2- LUMBAR 3 (N/A )     Patient location during evaluation: PACU Anesthesia Type: General Level of consciousness: awake Pain management: pain level controlled Vital Signs Assessment: post-procedure vital signs reviewed and stable Respiratory status: spontaneous breathing Cardiovascular status: stable Anesthetic complications: no    Last Vitals:  Vitals:   09/14/17 0500 09/14/17 0746  BP: 121/63 (!) 114/54  Pulse: 87 82  Resp: 15 16  Temp: 36.8 C 36.9 C  SpO2: 98% 99%    Last Pain:  Vitals:   09/14/17 0800  TempSrc:   PainSc: 2                  Taegen Delker

## 2017-09-14 NOTE — Evaluation (Signed)
Physical Therapy Evaluation Patient Details Name: [name] MRN: [id number] DOB: 07-14-1946 Today's Date: 09/14/2017   History of Present Illness  Pt is a 71 yo female with h/o of multiple back surgies who was admitted for redo L4-5 laminectomy and PLIFx1 level at L2-L3.   Clinical Impression  Patient is s/p above surgery resulting in the deficits listed below (see PT Problem List). This is patients 4th back procedure and is familiar with back precautions. Pt functioning at min guard. Pt to benefit from RW initially to allow for optimal posture and to minimize trunk flexion. Patient will benefit from skilled PT to increase their independence and safety with mobility (while adhering to their precautions) to allow discharge to the venue listed below.     Follow Up Recommendations No PT follow up;Supervision/Assistance - 24 hour    Equipment Recommendations  None recommended by PT (pt has walking stick and RW)    Recommendations for Other Services       Precautions / Restrictions Precautions Precautions: Back Precaution Booklet Issued: Yes (comment) Precaution Comments: pt with verbal understanding Required Braces or Orthoses: Spinal Brace Spinal Brace: Lumbar corset Restrictions Weight Bearing Restrictions: No Other Position/Activity Restrictions: lifting restrictions      Mobility  Bed Mobility               General bed mobility comments: pt received sitting up EOB, pt able to verbal describe logrolling to sidelying technique for transferring in/out of bed  Transfers Overall transfer level: Needs assistance Equipment used: None Transfers: Sit to/from Stand Sit to Stand: Min guard         General transfer comment: increased time, v/c's to minimize trunk flexion  Ambulation/Gait Ambulation/Gait assistance: Min guard Ambulation Distance (Feet): 200 Feet Assistive device: Rolling walker (2 wheeled);None Gait Pattern/deviations: Step-through pattern;Decreased  stride length;Trunk flexed Gait velocity: slow Gait velocity interpretation: Below normal speed for age/gender General Gait Details: pt initialy amb without AD however pt very guarded, increased trunk flexion, reaching for something to hold onto and had wide base of support. Pt given RW and pt with erect posture, minimal UE WBing, increased step length and fluidity of gait pattern. v/c's to maintain abdominal contraction with onset of fatigue to minimize trunk flexion  Stairs Stairs: Yes Stairs assistance: Min guard Stair Management: Two rails;Step to pattern;Backwards;Forwards Number of Stairs: 3 General stair comments: pt ascended fowards but descended backwards. pt report "I always go down the step backwards because I have better balance"  Wheelchair Mobility    Modified Rankin (Stroke Patients Only)       Balance Overall balance assessment: Needs assistance Sitting-balance support: Feet supported;No upper extremity supported Sitting balance-Leahy Scale: Good Sitting balance - Comments: pt able to don LSO without difficulty or LOB   Standing balance support: No upper extremity supported Standing balance-Leahy Scale: Fair Standing balance comment: pt unsteady without minimal external support                             Pertinent Vitals/Pain Pain Assessment: 0-10 Pain Score: 5  Pain Location: surgical site Pain Descriptors / Indicators: Operative site guarding Pain Intervention(s): Monitored during session    Home Living Family/patient expects to be discharged to:: Private residence Living Arrangements: Spouse/significant other Available Help at Discharge: Family;Available 24 hours/day Type of Home: House Home Access: Stairs to enter Entrance Stairs-Rails: Can reach both Entrance Stairs-Number of Steps: 3 Home Layout: One level Home Equipment: Walker - 4  wheels;Shower seat      Prior Function Level of Independence: Independent with assistive device(s)          Comments: pt was independent but needed to use RW for the last month due to R LE radiating pain causing instability     Hand Dominance   Dominant Hand: Right    Extremity/Trunk Assessment   Upper Extremity Assessment Upper Extremity Assessment: Overall WFL for tasks assessed    Lower Extremity Assessment Lower Extremity Assessment: Overall WFL for tasks assessed    Cervical / Trunk Assessment Cervical / Trunk Assessment: Other exceptions Cervical / Trunk Exceptions: recent back surgery  Communication   Communication: No difficulties  Cognition Arousal/Alertness: Awake/alert Behavior During Therapy: WFL for tasks assessed/performed Overall Cognitive Status: Within Functional Limits for tasks assessed                                        General Comments      Exercises Other Exercises Other Exercises: discussed isometric contractions of abdominal muscles for strengthening to support back   Assessment/Plan    PT Assessment Patient needs continued PT services  PT Problem List Decreased strength;Decreased activity tolerance;Decreased balance;Decreased mobility;Decreased coordination;Decreased knowledge of use of DME       PT Treatment Interventions DME instruction;Gait training;Stair training;Functional mobility training;Therapeutic activities;Therapeutic exercise;Balance training;Neuromuscular re-education    PT Goals (Current goals can be found in the Care Plan section)  Acute Rehab PT Goals Patient Stated Goal: go home today PT Goal Formulation: With patient Time For Goal Achievement: 09/21/17 Potential to Achieve Goals: Good    Frequency Min 5X/week   Barriers to discharge        Co-evaluation               AM-PAC PT "6 Clicks" Daily Activity  Outcome Measure Difficulty turning over in bed (including adjusting bedclothes, sheets and blankets)?: A Little Difficulty moving from lying on back to sitting on the side of the  bed? : A Little Difficulty sitting down on and standing up from a chair with arms (e.g., wheelchair, bedside commode, etc,.)?: A Little Help needed moving to and from a bed to chair (including a wheelchair)?: A Little Help needed walking in hospital room?: A Little Help needed climbing 3-5 steps with a railing? : A Little 6 Click Score: 18    End of Session Equipment Utilized During Treatment: Gait belt;Back brace Activity Tolerance: Patient tolerated treatment well Patient left: in chair;with call bell/phone within reach Nurse Communication: Mobility status PT Visit Diagnosis: Unsteadiness on feet (R26.81)    Time: 9476-5465 PT Time Calculation (min) (ACUTE ONLY): 16 min   Charges:   PT Evaluation $PT Eval Moderate Complexity: 1 Mod     PT G CodesKittie Plater, PT, DPT Pager #: [phone] Office #: [phone]   Takila Kronberg M Latisa Belay 09/14/2017, 8:09 AM

## 2017-09-20 ENCOUNTER — Other Ambulatory Visit: Payer: Self-pay

## 2017-09-20 ENCOUNTER — Inpatient Hospital Stay (HOSPITAL_COMMUNITY)
Admission: EM | Admit: 2017-09-20 | Discharge: 2017-09-28 | DRG: 909 | Disposition: A | Discharge status: Home Health | Payer: Medicare Other | Attending: Neurosurgery | Admitting: Neurosurgery

## 2017-09-20 ENCOUNTER — Emergency Department (HOSPITAL_COMMUNITY): Payer: Medicare Other

## 2017-09-20 ENCOUNTER — Encounter (HOSPITAL_COMMUNITY): Payer: Self-pay

## 2017-09-20 DIAGNOSIS — E669 Obesity, unspecified: Secondary | ICD-10-CM | POA: Diagnosis present

## 2017-09-20 DIAGNOSIS — Y838 Other surgical procedures as the cause of abnormal reaction of the patient, or of later complication, without mention of misadventure at the time of the procedure: Secondary | ICD-10-CM | POA: Diagnosis present

## 2017-09-20 DIAGNOSIS — I10 Essential (primary) hypertension: Secondary | ICD-10-CM | POA: Diagnosis not present

## 2017-09-20 DIAGNOSIS — G8928 Other chronic postprocedural pain: Secondary | ICD-10-CM | POA: Diagnosis not present

## 2017-09-20 DIAGNOSIS — G9782 Other postprocedural complications and disorders of nervous system: Secondary | ICD-10-CM | POA: Diagnosis present

## 2017-09-20 DIAGNOSIS — Z6835 Body mass index (BMI) 35.0-35.9, adult: Secondary | ICD-10-CM | POA: Diagnosis not present

## 2017-09-20 DIAGNOSIS — G9761 Postprocedural hematoma of a nervous system organ or structure following a nervous system procedure: Secondary | ICD-10-CM | POA: Diagnosis not present

## 2017-09-20 DIAGNOSIS — K5901 Slow transit constipation: Secondary | ICD-10-CM | POA: Diagnosis present

## 2017-09-20 DIAGNOSIS — Z8249 Family history of ischemic heart disease and other diseases of the circulatory system: Secondary | ICD-10-CM | POA: Diagnosis not present

## 2017-09-20 DIAGNOSIS — M545 Low back pain: Secondary | ICD-10-CM | POA: Diagnosis not present

## 2017-09-20 DIAGNOSIS — G8918 Other acute postprocedural pain: Secondary | ICD-10-CM | POA: Diagnosis present

## 2017-09-20 DIAGNOSIS — M1990 Unspecified osteoarthritis, unspecified site: Secondary | ICD-10-CM | POA: Diagnosis not present

## 2017-09-20 DIAGNOSIS — Z888 Allergy status to other drugs, medicaments and biological substances status: Secondary | ICD-10-CM

## 2017-09-20 DIAGNOSIS — Z885 Allergy status to narcotic agent status: Secondary | ICD-10-CM

## 2017-09-20 DIAGNOSIS — M549 Dorsalgia, unspecified: Secondary | ICD-10-CM | POA: Diagnosis not present

## 2017-09-20 DIAGNOSIS — M9689 Other intraoperative and postprocedural complications and disorders of the musculoskeletal system: Secondary | ICD-10-CM | POA: Diagnosis not present

## 2017-09-20 DIAGNOSIS — R51 Headache: Secondary | ICD-10-CM | POA: Diagnosis not present

## 2017-09-20 DIAGNOSIS — R110 Nausea: Secondary | ICD-10-CM | POA: Diagnosis not present

## 2017-09-20 DIAGNOSIS — M48061 Spinal stenosis, lumbar region without neurogenic claudication: Secondary | ICD-10-CM | POA: Diagnosis not present

## 2017-09-20 DIAGNOSIS — K2970 Gastritis, unspecified, without bleeding: Secondary | ICD-10-CM | POA: Diagnosis not present

## 2017-09-20 DIAGNOSIS — K5900 Constipation, unspecified: Secondary | ICD-10-CM | POA: Diagnosis not present

## 2017-09-20 DIAGNOSIS — M96840 Postprocedural hematoma of a musculoskeletal structure following a musculoskeletal system procedure: Secondary | ICD-10-CM | POA: Diagnosis not present

## 2017-09-20 LAB — CBC WITH DIFFERENTIAL/PLATELET
Basophils Absolute: 0 10*3/uL (ref 0.0–0.1)
Basophils Relative: 0 %
Eosinophils Absolute: 0.2 10*3/uL (ref 0.0–0.7)
Eosinophils Relative: 3 %
HEMATOCRIT: 29 % — AB (ref 36.0–46.0)
Hemoglobin: 9.3 g/dL — ABNORMAL LOW (ref 12.0–15.0)
LYMPHS ABS: 1.4 10*3/uL (ref 0.7–4.0)
LYMPHS PCT: 23 %
MCH: 27.1 pg (ref 26.0–34.0)
MCHC: 32.1 g/dL (ref 30.0–36.0)
MCV: 84.5 fL (ref 78.0–100.0)
MONO ABS: 0.5 10*3/uL (ref 0.1–1.0)
MONOS PCT: 8 %
NEUTROS ABS: 3.9 10*3/uL (ref 1.7–7.7)
Neutrophils Relative %: 66 %
Platelets: 253 10*3/uL (ref 150–400)
RBC: 3.43 MIL/uL — ABNORMAL LOW (ref 3.87–5.11)
RDW: 15.1 % (ref 11.5–15.5)
WBC: 5.9 10*3/uL (ref 4.0–10.5)

## 2017-09-20 LAB — COMPREHENSIVE METABOLIC PANEL
ALK PHOS: 103 U/L (ref 38–126)
ALT: 14 U/L (ref 14–54)
ANION GAP: 11 (ref 5–15)
AST: 17 U/L (ref 15–41)
Albumin: 3.4 g/dL — ABNORMAL LOW (ref 3.5–5.0)
BILIRUBIN TOTAL: 0.6 mg/dL (ref 0.3–1.2)
BUN: 14 mg/dL (ref 6–20)
CALCIUM: 8.8 mg/dL — AB (ref 8.9–10.3)
CO2: 25 mmol/L (ref 22–32)
Chloride: 101 mmol/L (ref 101–111)
Creatinine, Ser: 0.78 mg/dL (ref 0.44–1.00)
GFR calc Af Amer: >60 mL/min (ref >60)
Glucose, Bld: 117 mg/dL — ABNORMAL HIGH (ref 65–99)
POTASSIUM: 4.2 mmol/L (ref 3.5–5.1)
Sodium: 137 mmol/L (ref 135–145)
TOTAL PROTEIN: 6.9 g/dL (ref 6.5–8.1)

## 2017-09-20 LAB — URINALYSIS, ROUTINE W REFLEX MICROSCOPIC
BILIRUBIN URINE: NEGATIVE
GLUCOSE, UA: NEGATIVE mg/dL
Hgb urine dipstick: NEGATIVE
KETONES UR: NEGATIVE mg/dL
Leukocytes, UA: NEGATIVE
Nitrite: NEGATIVE
PH: 7 (ref 5.0–8.0)
Protein, ur: NEGATIVE mg/dL
Specific Gravity, Urine: 1.009 (ref 1.005–1.030)

## 2017-09-20 MED ORDER — GADOBENATE DIMEGLUMINE 529 MG/ML IV SOLN
20.0000 mL | Freq: Once | INTRAVENOUS | Status: AC | PRN
  Administered 2017-09-20: 20 mL via INTRAVENOUS

## 2017-09-20 MED ORDER — ONDANSETRON HCL 4 MG PO TABS: 4.0000 mg | ORAL_TABLET | Freq: Four times a day (QID) | ORAL | Status: DC | PRN

## 2017-09-20 MED ORDER — HYDROMORPHONE HCL 1 MG/ML IJ SOLN
1.0000 mg | Freq: Once | INTRAMUSCULAR | Status: DC
  Filled 2017-09-20: qty 1

## 2017-09-20 MED ORDER — DOCUSATE SODIUM 100 MG PO CAPS
100.0000 mg | ORAL_CAPSULE | Freq: Two times a day (BID) | ORAL | Status: DC
  Administered 2017-09-20 – 2017-09-27 (×14): 100 mg via ORAL
  Filled 2017-09-20 (×14): qty 1

## 2017-09-20 MED ORDER — FENTANYL CITRATE (PF) 100 MCG/2ML IJ SOLN
50.0000 ug | INTRAMUSCULAR | Status: DC | PRN
  Administered 2017-09-20 (×5): 50 ug via INTRAVENOUS
  Filled 2017-09-20 (×5): qty 2

## 2017-09-20 MED ORDER — HYDROMORPHONE HCL 1 MG/ML IJ SOLN
0.5000 mg | Freq: Once | INTRAMUSCULAR | Status: AC
  Administered 2017-09-20: 0.5 mg via INTRAVENOUS
  Filled 2017-09-20: qty 1

## 2017-09-20 MED ORDER — LACTATED RINGERS IV SOLN
INTRAVENOUS | Status: DC
  Filled 2017-09-20 (×5): qty 1000

## 2017-09-20 MED ORDER — HYDROMORPHONE HCL 1 MG/ML IJ SOLN
0.5000 mg | Freq: Once | INTRAMUSCULAR | Status: AC
  Administered 2017-09-20: 0.5 mg via INTRAVENOUS

## 2017-09-20 MED ORDER — DEXAMETHASONE SODIUM PHOSPHATE 4 MG/ML IJ SOLN
4.0000 mg | Freq: Four times a day (QID) | INTRAMUSCULAR | Status: AC
  Administered 2017-09-20 – 2017-09-22 (×6): 4 mg via INTRAVENOUS
  Filled 2017-09-20 (×6): qty 1

## 2017-09-20 MED ORDER — FLEET ENEMA 7-19 GM/118ML RE ENEM: 1.0000 | ENEMA | Freq: Once | RECTAL | Status: DC | PRN

## 2017-09-20 MED ORDER — CYCLOBENZAPRINE HCL 10 MG PO TABS
10.0000 mg | ORAL_TABLET | Freq: Three times a day (TID) | ORAL | Status: DC | PRN
  Filled 2017-09-20: qty 1

## 2017-09-20 MED ORDER — SODIUM CHLORIDE 0.9 % IV SOLN
INTRAVENOUS | Status: DC
  Administered 2017-09-20: 08:00:00 via INTRAVENOUS

## 2017-09-20 MED ORDER — ONDANSETRON HCL 4 MG/2ML IJ SOLN
4.0000 mg | Freq: Three times a day (TID) | INTRAMUSCULAR | Status: AC | PRN
  Administered 2017-09-20: 4 mg via INTRAVENOUS
  Filled 2017-09-20: qty 2

## 2017-09-20 MED ORDER — HYDROMORPHONE HCL 1 MG/ML IJ SOLN
1.0000 mg | Freq: Once | INTRAMUSCULAR | Status: AC
  Administered 2017-09-20: 1 mg via INTRAVENOUS
  Filled 2017-09-20: qty 1

## 2017-09-20 MED ORDER — MENTHOL 3 MG MT LOZG: 1.0000 | LOZENGE | OROMUCOSAL | Status: DC | PRN

## 2017-09-20 MED ORDER — ONDANSETRON HCL 4 MG/2ML IJ SOLN
4.0000 mg | Freq: Four times a day (QID) | INTRAMUSCULAR | Status: DC | PRN
  Administered 2017-09-20 – 2017-09-26 (×2): 4 mg via INTRAVENOUS
  Filled 2017-09-20 (×2): qty 2

## 2017-09-20 MED ORDER — BISACODYL 10 MG RE SUPP
10.0000 mg | Freq: Every day | RECTAL | Status: DC | PRN
  Administered 2017-09-22 – 2017-09-26 (×2): 10 mg via RECTAL
  Filled 2017-09-20 (×3): qty 1

## 2017-09-20 MED ORDER — SODIUM CHLORIDE 0.9 % IV SOLN: INTRAVENOUS | Status: AC

## 2017-09-20 MED ORDER — ACETAMINOPHEN 650 MG RE SUPP: 650.0000 mg | RECTAL | Status: DC | PRN

## 2017-09-20 MED ORDER — MORPHINE SULFATE (PF) 4 MG/ML IV SOLN
4.0000 mg | INTRAVENOUS | Status: DC | PRN
  Administered 2017-09-20 – 2017-09-26 (×18): 4 mg via INTRAVENOUS
  Filled 2017-09-20 (×18): qty 1

## 2017-09-20 MED ORDER — ZOLPIDEM TARTRATE 5 MG PO TABS
5.0000 mg | ORAL_TABLET | Freq: Every evening | ORAL | Status: DC | PRN
  Administered 2017-09-22 – 2017-09-26 (×5): 5 mg via ORAL
  Filled 2017-09-20 (×5): qty 1

## 2017-09-20 MED ORDER — PHENOL 1.4 % MT LIQD: 1.0000 | OROMUCOSAL | Status: DC | PRN

## 2017-09-20 MED ORDER — ACETAMINOPHEN 325 MG PO TABS
650.0000 mg | ORAL_TABLET | ORAL | Status: DC | PRN
  Filled 2017-09-20: qty 2

## 2017-09-20 MED ORDER — OXYCODONE HCL 5 MG PO TABS
10.0000 mg | ORAL_TABLET | ORAL | Status: DC | PRN
  Administered 2017-09-21 – 2017-09-27 (×13): 10 mg via ORAL
  Filled 2017-09-20 (×14): qty 2

## 2017-09-20 NOTE — ED Provider Notes (Signed)
[facility] EMERGENCY DEPARTMENT Provider Note   CSN: [id number] Arrival date & time: 09/20/17  0701     History   Chief Complaint Chief Complaint  Patient presents with  . Back Pain    HPI [name] is a 71 y.o. female.  HPI  The patient is a 71 year old female, history of hypertension arthritis and multiple surgical interventions on her back for history of chronic back pain, spinal stenosis and neurogenic claudication.  The patient has had some difficulty walking over time but since coming home 6 days ago from her surgery which occurred on 29 October she has had increased amounts of pain to the point where she is unable to move without the assistance of her husband.  Even with assistance in getting up to a walker she is having difficulty ambulating, she has not had a bowel movement since coming home and after passing a very hard golf ball sized stool the other day has felt nonstop pain in both her lower back and her abdomen.  She reports that the pain shoots down both of her legs.  She feels like she has numbness in both of her legs.  She has not had any urinary incontinence or retention.  She feels like she might have a fever objectively she has a temperature of 98.8.  She denies coughing or shortness of breath or headache.  She has been taking oxycodone 5 mg every 4 hours for pain which she has used chronically.  She has been taking Colace without relief.  She has not seen her neurosurgeon, Dr. Arnoldo Morale since discharge, follow-up is this week she believes  Past Medical History:  Diagnosis Date  . Arthritis   . Hypertension    no meds in 2 yrs pt took self off  . PONV (postoperative nausea and vomiting)     Patient Active Problem List   Diagnosis Date Noted  . Post-operative pain 09/20/2017  . Lumbar stenosis with neurogenic claudication 02/04/2015  . Stiffness of joints, not elsewhere classified, multiple sites 10/27/2012  . Weakness of both legs 10/27/2012  . Difficulty  in walking(719.7) 10/27/2012    Past Surgical History:  Procedure Laterality Date  . ABDOMINAL HYSTERECTOMY  82  . BACK SURGERY  2010  . BACK SURGERY  2015  . BACK SURGERY  06/2016  . FRACTURE SURGERY Right    arm  1974    OB History    No data available       Home Medications    Prior to Admission medications   Medication Sig Start Date End Date Taking? Authorizing Provider  docusate sodium (COLACE) 100 MG capsule Take 1 capsule (100 mg total) by mouth 2 (two) times daily. 09/14/17  Yes Newman Pies, MD  oxyCODONE (OXY IR/ROXICODONE) 5 MG immediate release tablet Take 1 tablet (5 mg total) by mouth every 3 (three) hours as needed for moderate pain ((score 4 to 6)). 09/14/17  Yes Newman Pies, MD  oxyCODONE-acetaminophen (PERCOCET/ROXICET) 5-325 MG tablet Take 1 tablet by mouth every 4 (four) hours as needed for severe pain.    Yes [provider]  cyclobenzaprine (FLEXERIL) 10 MG tablet Take 1 tablet (10 mg total) by mouth 3 (three) times daily as needed for muscle spasms. Patient not taking: Reported on 09/20/2017 09/14/17   Newman Pies, MD    Family History Family History  Problem Relation Age of Onset  . Hypertension Mother     Social History Social History   Tobacco Use  . Smoking  status: Never Smoker  . Smokeless tobacco: Never Used  Substance Use Topics  . Alcohol use: No  . Drug use: No     Allergies   Hydrocodone; Lyrica [pregabalin]; and Neurontin [gabapentin]   Review of Systems Review of Systems  All other systems reviewed and are negative.    Physical Exam Updated Vital Signs BP (!) 141/65 (BP Location: Right Arm)   Pulse 92   Temp 98.8 F (37.1 C) (Oral)   Resp 16   Ht 5\' 4"  (1.626 m)   Wt 94.8 kg (209 lb)   SpO2 93%   BMI 35.87 kg/m   Physical Exam  Constitutional: She appears well-developed and well-nourished. No distress.  HENT:  Head: Normocephalic and atraumatic.  Mouth/Throat: Oropharynx is clear and  moist. No oropharyngeal exudate.  Eyes: Conjunctivae and EOM are normal. Pupils are equal, round, and reactive to light. Right eye exhibits no discharge. Left eye exhibits no discharge. No scleral icterus.  Neck: Normal range of motion. Neck supple. No JVD present. No thyromegaly present.  Cardiovascular: Normal rate, regular rhythm, normal heart sounds and intact distal pulses. Exam reveals no gallop and no friction rub.  No murmur heard. Pulmonary/Chest: Effort normal and breath sounds normal. No respiratory distress. She has no wheezes. She has no rales.  Abdominal: Soft. Bowel sounds are normal. She exhibits no distension and no mass. There is tenderness ( mild diffuse abd ttp).  Musculoskeletal: Normal range of motion. She exhibits tenderness ( There is bruising in the paraspinal muscles of the thoracic and lumbar area). She exhibits no edema.  Lymphadenopathy:    She has no cervical adenopathy.  Neurological: She is alert. Coordination normal.  The patient is able to move all 4 extremities with normal strength including normal grips, she can flex and extend at the knees and the hips bilaterally though this does cause severe pain in her legs and her back.  Sensation to light touch is normal bilaterally, I cannot test reflexes as her muscles are tight, spasming and she insists on lying on her side as it causes pain to lay on her back.  Skin: Skin is warm and dry. No rash noted. No erythema.  The wound is clean dry and intact over the lumbar spine  Psychiatric: She has a normal mood and affect. Her behavior is normal.  Nursing note and vitals reviewed.    ED Treatments / Results  Labs (all labs ordered are listed, but only abnormal results are displayed) Labs Reviewed  CBC WITH DIFFERENTIAL/PLATELET - Abnormal; Notable for the following components:      Result Value   RBC 3.43 (*)    Hemoglobin 9.3 (*)    HCT 29.0 (*)    All other components within normal limits  COMPREHENSIVE  METABOLIC PANEL - Abnormal; Notable for the following components:   Glucose, Bld 117 (*)    Calcium 8.8 (*)    Albumin 3.4 (*)    All other components within normal limits  URINALYSIS, ROUTINE W REFLEX MICROSCOPIC     Radiology Dg Abd 1 View  Result Date: 09/20/2017 CLINICAL DATA:  Nausea and constipation EXAM: ABDOMEN - 1 VIEW COMPARISON:  None. FINDINGS: There is diffuse stool throughout much of the colon. There is no bowel dilatation or air-fluid level to suggest bowel obstruction. No free air. There is multifocal postoperative change in the lumbar spine and upper sacrum. IMPRESSION: Diffuse stool throughout colon. No bowel obstruction or free air evident. Electronically Signed   By: Gwyndolyn Saxon  Jasmine December III M.D.   On: 09/20/2017 08:47   Mr Lumbar Spine W Wo Contrast  Result Date: 09/20/2017 CLINICAL DATA:  Lumbar spine stenosis. Spinal fusion 1 week ago. Back pain since that time with no bowel movement since. EXAM: MRI LUMBAR SPINE WITHOUT AND WITH CONTRAST TECHNIQUE: Multiplanar and multiecho pulse sequences of the lumbar spine were obtained without and with intravenous contrast. CONTRAST:  26mL MULTIHANCE GADOBENATE DIMEGLUMINE 529 MG/ML IV SOLN COMPARISON:  Lumbar myelogram 08/16/2017 FINDINGS: Segmentation:  Standard. Alignment:  Physiologic. Vertebrae: Recent L2-3 TLIF. Pre-existing L3-S1 fusion with L3-4 and L4-5 discectomy cages. Hardware is in expected position by MRI. Radiography would be complementary. No evidence of acute fracture. No evidence of discitis. Conus medullaris: Extends to the T12 level and appears normal. New spinal fluid collection ventrally with subdural appearance, likely extending from T12-L1 to L2-3 at least. Paraspinal and other soft tissues: There is a collection in the subcutaneous incision measuring 5 cm in width by 14 cm in length. Internally there are septations and fluid fluid levels consistent with hematoma seroma. Subtle channel is seen at the level of L4/5  extending through the deep fascia. There is fluid within the laminectomy defect of L4 and L5, also with fluid fluid levels. This appears to be under tension as there is complete thecal sac compression from behind at L4 and L5. Disc levels: T12- L1: Ventral intrathecal fluid collection with mild cauda equina mass effect. L1-L2: Cauda equina crowding due to ventral fluid collection measuring 8 x 16 mm on axial slices. L2-L3: Interval discectomy. Residual posterior disc bulging and fluid in the posterior element defect causes advanced spinal stenosis with no visible CSF. Patent foramina. L3-L4: Discectomy with no residual impingement. Clustering of the cauda equina that is likely from compressive stenoses above and below. L4-L5: Remote discectomy. Interval posterior decompression with fluid collection causing mass effect on the thecal sac. No bony impingement noted. L5-S1:Remote discectomy. Laminectomy with fluid collection compressing the thecal sac from behind. Patent foramina. IMPRESSION: 1. Recent L4 and L5 laminectomy with fluid collection in the defect that completely flattens the thecal sac at L4 and L5. The collection contains a few fluid fluid levels and is at least partially hematoma/seroma. 2. L2-3 recent TLIF. Advanced spinal stenosis due to posterior fluid and small residual disc. 3. Ventral fluid collection from T12-L1 to L2-3, likely subdural. Collection is greatest at L1-2 where there is mild to moderate mass effect on the cauda equina. 4. Subcutaneous incisional hematoma/seroma. Small contiguous defect in the deep fascia seen at L4-5. Electronically Signed   By: Monte Fantasia M.D.   On: 09/20/2017 08:43    Procedures Procedures (including critical care time)  Medications Ordered in ED Medications  0.9 %  sodium chloride infusion ( Intravenous New Bag/Given 09/20/17 0754)  fentaNYL (SUBLIMAZE) injection 50 mcg (50 mcg Intravenous Given 09/20/17 0949)  0.9 %  sodium chloride infusion (not  administered)  ondansetron (ZOFRAN) injection 4 mg (not administered)  HYDROmorphone (DILAUDID) injection 1 mg (1 mg Intravenous Given 09/20/17 0754)  gadobenate dimeglumine (MULTIHANCE) injection 20 mL (20 mLs Intravenous Contrast Given 09/20/17 0803)     Initial Impression / Assessment and Plan / ED Course  I have reviewed the triage vital signs and the nursing notes.  Pertinent labs & imaging results that were available during my care of the patient were reviewed by me and considered in my medical decision making (see chart for details).     It is unclear the exact cause of her pain  though I suspect is postoperative muscle spasm, I do not detect any focal neurologic deficits that would make me concerned for a postoperative hematoma or infection however the patient states she has been feeling febrile and has had progressive loss of function with increasing pain which is going to require advanced imaging of the spine.  I doubt that the entirety of her syndrome is caused by the constipation  Has had ongoing pain MRI done and reviewed D/w Dr. Arnoldo Morale Will admit for ongoing evaluation [facility]  Final Clinical Impressions(s) / ED Diagnoses   Final diagnoses:  Post-operative pain  Slow transit constipation    ED Discharge Orders    None       Noemi Chapel, MD 09/20/17 1009

## 2017-09-20 NOTE — ED Triage Notes (Signed)
Pt had spinal fusion 1 week ago.  Reports back pain since then and hasn't had a bm since surgery.  Denies urinary problems.

## 2017-09-20 NOTE — ED Notes (Signed)
Pt husbands number if need to contact. [phone]

## 2017-09-20 NOTE — ED Notes (Signed)
Patient called out states she is having stomach cramps. RN notified.

## 2017-09-20 NOTE — ED Notes (Signed)
Pt was doing well after surgery until Thursday afternoon. Pt states has not had good bowel movement since surgery. Pt was only taking colace for stool softener.

## 2017-09-20 NOTE — H&P (Signed)
Subjective: The patient is a 71 year old white female on whom I performed a lumbar fusion a week ago. She was discharged on postoperative day #1. She tells me she did well until 09/18/2017 when she began having increasing back pain and aching in her bilateral legs. She had worsening pain this morning and also noted a headache. She's had no fevers or drainage from her wound. She called EMS and was taken to [facility] ER where a lumbar MRI this morning demonstrated an epidural fluid collection. She was transported to [facility].  Presently the patient is alert and pleasant. She complains of back pain, bilateral leg pain, and headache. She also complains of constipation.  Past Medical History:  Diagnosis Date  . Arthritis   . Hypertension    no meds in 2 yrs pt took self off  . PONV (postoperative nausea and vomiting)     Past Surgical History:  Procedure Laterality Date  . ABDOMINAL HYSTERECTOMY  82  . BACK SURGERY  2010  . BACK SURGERY  2015  . BACK SURGERY  06/2016  . FRACTURE SURGERY Right    arm  1974    Allergies  Allergen Reactions  . Hydrocodone   . Lyrica [Pregabalin]   . Neurontin [Gabapentin] Palpitations    Social History   Tobacco Use  . Smoking status: Never Smoker  . Smokeless tobacco: Never Used  Substance Use Topics  . Alcohol use: No    Family History  Problem Relation Age of Onset  . Hypertension Mother    Prior to Admission medications   Medication Sig Start Date End Date Taking? Authorizing Provider  docusate sodium (COLACE) 100 MG capsule Take 1 capsule (100 mg total) by mouth 2 (two) times daily. 09/14/17  Yes Newman Pies, MD  oxyCODONE (OXY IR/ROXICODONE) 5 MG immediate release tablet Take 1 tablet (5 mg total) by mouth every 3 (three) hours as needed for moderate pain ((score 4 to 6)). 09/14/17  Yes Newman Pies, MD  oxyCODONE-acetaminophen (PERCOCET/ROXICET) 5-325 MG tablet Take 1 tablet by mouth every 4 (four) hours as needed for  severe pain.    Yes [provider]  cyclobenzaprine (FLEXERIL) 10 MG tablet Take 1 tablet (10 mg total) by mouth 3 (three) times daily as needed for muscle spasms. Patient not taking: Reported on 09/20/2017 09/14/17   Newman Pies, MD     Review of Systems  Positive ROS: : As above  All other systems have been reviewed and were otherwise negative with the exception of those mentioned in the HPI and as above.  Objective: Vital signs in last 24 hours: Temp:  [98.6 F (37 C)-99.3 F (37.4 C)] 98.6 F (37 C) (11/05 2128) Pulse Rate:  [91-102] 94 (11/05 2128) Resp:  [16-18] 18 (11/05 2128) BP: (136-159)/(65-81) 140/67 (11/05 2128) SpO2:  [93 %-100 %] 95 % (11/05 2128) Weight:  [94.8 kg (209 lb)-94.9 kg (209 lb 4.8 oz)] 94.9 kg (209 lb 4.8 oz) (11/05 1915)  Physical exam:  General: An alert and pleasant 71 year old white female in no obvious distress.  HEENT: Normocephalic, atraumatic, extraocular muscles intact  Thorax: Symmetric  Abdomen: Soft  Back exam: The patient has some peri-incisional ecchymosis. Her incision is otherwise well-healed. There is no signs of infection.  Neurologic exam: The patient is alert and oriented 3. Her strength is grossly normal and her bilateral quadriceps, gastrocnemius and dorsiflexors. Sensory function is intact to light touch sensation all tested dermatomes bilaterally.  I reviewed the patient's lumbar MRI performed  at [facility] today. She has an epidural and subdural fluid collection. She has spinal stenosis. Data Review Lab Results  Component Value Date   WBC 5.9 09/20/2017   HGB 9.3 (L) 09/20/2017   HCT 29.0 (L) 09/20/2017   MCV 84.5 09/20/2017   PLT 253 09/20/2017   Lab Results  Component Value Date   NA 137 09/20/2017   K 4.2 09/20/2017   CL 101 09/20/2017   CO2 25 09/20/2017   BUN 14 09/20/2017   CREATININE 0.78 09/20/2017   GLUCOSE 117 (H) 09/20/2017   Lab Results  Component Value Date   INR 1.00  01/27/2011    Assessment/Plan: Back pain, epidural fluid collection, headache, constipation: I have discussed situation with the patient. We are going to admit her for observation. I'll start steroids to see if inflammation is playing a role. We discussed the various possibilities including a resolving hematoma, involving seroma, CSF leak, etc. She does not improve we would likely proceed with a exploration of her wound in the next day or 2. I have answered all her questions.   Ophelia Charter 09/20/2017 9:31 PM

## 2017-09-20 NOTE — ED Notes (Signed)
Purewick placed on patient instead of a catheter per pt request.

## 2017-09-21 NOTE — Progress Notes (Signed)
Pt admitted to [facility] from [facility]. On arrival pt is alert and orientex4. VSS. Pt oriented to unit and equipment. White identification bracelet in place. WCTM

## 2017-09-21 NOTE — Evaluation (Signed)
Physical Therapy Evaluation Patient Details Name: [name] MRN: [id number] DOB: 1946/07/27 Today's Date: 09/21/2017   History of Present Illness  Pt is a 71 y/o female admitted secondary to worsening post op pain. Pt had PLIF and laminectomy on 09/14/17. Imaging revealed fluid collection at L4-5, and T12-L3. PMH includes HTN and multiple back surgeries.   Clinical Impression  Pt admitted secondary to problem above with deficits below. PTA, pt was using RW following recent back surgery, however, when pt began experiencing increased pain, required assist from husband to stand. Upon eval, pt limited by back pain, weakness, and decreased balance. Required min to min guard assist for mobility and able to tolerate short ambulation distance in room. Feel pt will progress well once back pain is better and do not feel she will need follow up PT services at d/c. Will have assist from husband and has all necessary DME. Will continue to follow acutely and progress mobility to ensure safety and independence with functional mobility.     Follow Up Recommendations No PT follow up;Supervision/Assistance - 24 hour    Equipment Recommendations  None recommended by PT(has all necessary DME )    Recommendations for Other Services       Precautions / Restrictions Precautions Precautions: Back Precaution Booklet Issued: No Precaution Comments: Pt able to recall 2/3 back precautions. Required cues for no lifting back precautions.  Required Braces or Orthoses: Spinal Brace Spinal Brace: Lumbar corset Restrictions Weight Bearing Restrictions: No      Mobility  Bed Mobility Overal bed mobility: Needs Assistance Bed Mobility: Rolling;Sidelying to Sit;Sit to Sidelying Rolling: Supervision Sidelying to sit: Min assist     Sit to sidelying: Min guard General bed mobility comments: Min A for trunk elevation in sidelying secondary to pain. Demonstrated good log roll technique.   Transfers Overall  transfer level: Needs assistance Equipment used: Rolling walker (2 wheeled) Transfers: Sit to/from Stand Sit to Stand: Min assist         General transfer comment: Min A for lift assist. Required increased time and effort to stand.   Ambulation/Gait Ambulation/Gait assistance: Min guard Ambulation Distance (Feet): 25 Feet Assistive device: Rolling walker (2 wheeled);None Gait Pattern/deviations: Step-through pattern;Decreased stride length Gait velocity: Decreased Gait velocity interpretation: Below normal speed for age/gender General Gait Details: Slow, guarded gait secondary to back pain. Able to ambulate distances within the room this session, however, limited by pain.   Stairs            Wheelchair Mobility    Modified Rankin (Stroke Patients Only)       Balance Overall balance assessment: Needs assistance Sitting-balance support: No upper extremity supported;Feet supported Sitting balance-Leahy Scale: Good     Standing balance support: Bilateral upper extremity supported;No upper extremity supported;During functional activity Standing balance-Leahy Scale: Fair Standing balance comment: Able to maintain static standing without UE support                              Pertinent Vitals/Pain Pain Assessment: 0-10 Pain Score: 5  Pain Location: back  Pain Descriptors / Indicators: Operative site guarding;Sore Pain Intervention(s): Limited activity within patient's tolerance;Monitored during session;Repositioned    Home Living Family/patient expects to be discharged to:: Private residence Living Arrangements: Spouse/significant other Available Help at Discharge: Family;Available 24 hours/day Type of Home: House Home Access: Stairs to enter Entrance Stairs-Rails: Can reach both Entrance Stairs-Number of Steps: 3 Home Layout: One level Home Equipment:  Walker - 4 wheels;Shower seat      Prior Function Level of Independence: Independent with  assistive device(s)         Comments: Pt was using RW following PLIF on 10/30.      Hand Dominance   Dominant Hand: Right    Extremity/Trunk Assessment   Upper Extremity Assessment Upper Extremity Assessment: Overall WFL for tasks assessed    Lower Extremity Assessment Lower Extremity Assessment: Generalized weakness    Cervical / Trunk Assessment Cervical / Trunk Assessment: Other exceptions Cervical / Trunk Exceptions: recent back surgery  Communication   Communication: No difficulties  Cognition Arousal/Alertness: Awake/alert Behavior During Therapy: WFL for tasks assessed/performed Overall Cognitive Status: Within Functional Limits for tasks assessed                                        General Comments      Exercises     Assessment/Plan    PT Assessment Patient needs continued PT services  PT Problem List Decreased strength;Decreased balance;Decreased mobility;Decreased knowledge of use of DME;Decreased knowledge of precautions;Pain       PT Treatment Interventions DME instruction;Gait training;Stair training;Functional mobility training;Therapeutic activities;Therapeutic exercise;Balance training;Neuromuscular re-education;Patient/family education    PT Goals (Current goals can be found in the Care Plan section)  Acute Rehab PT Goals Patient Stated Goal: to decrease pain  PT Goal Formulation: With patient Time For Goal Achievement: 10/05/17 Potential to Achieve Goals: Good    Frequency Min 3X/week   Barriers to discharge        Co-evaluation               AM-PAC PT "6 Clicks" Daily Activity  Outcome Measure Difficulty turning over in bed (including adjusting bedclothes, sheets and blankets)?: A Little Difficulty moving from lying on back to sitting on the side of the bed? : Unable Difficulty sitting down on and standing up from a chair with arms (e.g., wheelchair, bedside commode, etc,.)?: Unable Help needed moving to  and from a bed to chair (including a wheelchair)?: A Little Help needed walking in hospital room?: A Little Help needed climbing 3-5 steps with a railing? : A Lot 6 Click Score: 13    End of Session Equipment Utilized During Treatment: Gait belt;Back brace Activity Tolerance: Patient tolerated treatment well Patient left: in bed;with call bell/phone within reach Nurse Communication: Mobility status PT Visit Diagnosis: Unsteadiness on feet (R26.81);Pain Pain - part of body: (back )    Time: 5361-4431 PT Time Calculation (min) (ACUTE ONLY): 19 min   Charges:   PT Evaluation $PT Eval Low Complexity: 1 Low     PT G Codes:        Leighton Ruff, PT, DPT  Acute Rehabilitation Services  Pager: [phone]   Rudean Hitt 09/21/2017, 2:05 PM

## 2017-09-21 NOTE — Progress Notes (Signed)
Patient ID: [name], female   DOB: 09-24-1946, 71 y.o.   MRN: [id number] Subjective:  The patient is alert and pleasant. She looks and feels much better. She has stood at the bedside. She denies headaches.  Objective: Vital signs in last 24 hours: Temp:  [97.7 F (36.5 C)-99.3 F (37.4 C)] 98.5 F (36.9 C) (11/06 0449) Pulse Rate:  [91-102] 92 (11/06 0449) Resp:  [16-18] 18 (11/06 0449) BP: (136-175)/(65-96) 140/78 (11/06 0449) SpO2:  [93 %-100 %] 94 % (11/06 0449) Weight:  [94.9 kg (209 lb 4.8 oz)] 94.9 kg (209 lb 4.8 oz) (11/05 1915)  Intake/Output from previous day: 11/05 0701 - 11/06 0700 In: 0  Out: 100 [Urine:100] Intake/Output this shift: No intake/output data recorded.  Physical exam the patient is alert and oriented 3. Her strength is normal and her bowel gastrocnemius and dorsiflexors.  Lab Results: Recent Labs    09/20/17 0735  WBC 5.9  HGB 9.3*  HCT 29.0*  PLT 253   BMET Recent Labs    09/20/17 0859  NA 137  K 4.2  CL 101  CO2 25  GLUCOSE 117*  BUN 14  CREATININE 0.78  CALCIUM 8.8*    Studies/Results: Dg Abd 1 View  Result Date: 09/20/2017 CLINICAL DATA:  Nausea and constipation EXAM: ABDOMEN - 1 VIEW COMPARISON:  None. FINDINGS: There is diffuse stool throughout much of the colon. There is no bowel dilatation or air-fluid level to suggest bowel obstruction. No free air. There is multifocal postoperative change in the lumbar spine and upper sacrum. IMPRESSION: Diffuse stool throughout colon. No bowel obstruction or free air evident. Electronically Signed   By: Lowella Grip III M.D.   On: 09/20/2017 08:47   Mr Lumbar Spine W Wo Contrast  Result Date: 09/20/2017 CLINICAL DATA:  Lumbar spine stenosis. Spinal fusion 1 week ago. Back pain since that time with no bowel movement since. EXAM: MRI LUMBAR SPINE WITHOUT AND WITH CONTRAST TECHNIQUE: Multiplanar and multiecho pulse sequences of the lumbar spine were obtained without and with intravenous  contrast. CONTRAST:  57mL MULTIHANCE GADOBENATE DIMEGLUMINE 529 MG/ML IV SOLN COMPARISON:  Lumbar myelogram 08/16/2017 FINDINGS: Segmentation:  Standard. Alignment:  Physiologic. Vertebrae: Recent L2-3 TLIF. Pre-existing L3-S1 fusion with L3-4 and L4-5 discectomy cages. Hardware is in expected position by MRI. Radiography would be complementary. No evidence of acute fracture. No evidence of discitis. Conus medullaris: Extends to the T12 level and appears normal. New spinal fluid collection ventrally with subdural appearance, likely extending from T12-L1 to L2-3 at least. Paraspinal and other soft tissues: There is a collection in the subcutaneous incision measuring 5 cm in width by 14 cm in length. Internally there are septations and fluid fluid levels consistent with hematoma seroma. Subtle channel is seen at the level of L4/5 extending through the deep fascia. There is fluid within the laminectomy defect of L4 and L5, also with fluid fluid levels. This appears to be under tension as there is complete thecal sac compression from behind at L4 and L5. Disc levels: T12- L1: Ventral intrathecal fluid collection with mild cauda equina mass effect. L1-L2: Cauda equina crowding due to ventral fluid collection measuring 8 x 16 mm on axial slices. L2-L3: Interval discectomy. Residual posterior disc bulging and fluid in the posterior element defect causes advanced spinal stenosis with no visible CSF. Patent foramina. L3-L4: Discectomy with no residual impingement. Clustering of the cauda equina that is likely from compressive stenoses above and below. L4-L5: Remote discectomy. Interval posterior decompression with  fluid collection causing mass effect on the thecal sac. No bony impingement noted. L5-S1:Remote discectomy. Laminectomy with fluid collection compressing the thecal sac from behind. Patent foramina. IMPRESSION: 1. Recent L4 and L5 laminectomy with fluid collection in the defect that completely flattens the thecal  sac at L4 and L5. The collection contains a few fluid fluid levels and is at least partially hematoma/seroma. 2. L2-3 recent TLIF. Advanced spinal stenosis due to posterior fluid and small residual disc. 3. Ventral fluid collection from T12-L1 to L2-3, likely subdural. Collection is greatest at L1-2 where there is mild to moderate mass effect on the cauda equina. 4. Subcutaneous incisional hematoma/seroma. Small contiguous defect in the deep fascia seen at L4-5. Electronically Signed   By: Monte Fantasia M.D.   On: 09/20/2017 08:43    Assessment/Plan: Epidural fluid collection, spinal stenosis: It is always difficult to know what to make of these early postoperative scans. Possibilities include a resolving hematoma, seroma, and spinal fluid leak. Clinically she is doing well since starting steroids so inflammation is likely playing a role. I don't think she needs surgery presently. We will continue to observe. I have answered her questions.  LOS: 1 day     Jadrian Bulman D 09/21/2017, 7:39 AM

## 2017-09-22 NOTE — Progress Notes (Signed)
Physical Therapy Treatment Patient Details Name: [name] MRN: [id number] DOB: [birth year] Today's Date: 09/22/2017    History of Present Illness Pt is a 71 y/o female admitted secondary to worsening post op pain. Pt had PLIF and laminectomy on 09/14/17. Imaging revealed fluid collection at L4-5, and T12-L3. PMH includes HTN and multiple back surgeries.     PT Comments    Pt mobility greatly limited by back pain. Pt was at 2/10 pain at rest but quickly increased to 10/10 with mobility and experienced muscle spasms in back bringing pt to tears limiting ambulation tolerance. Pt owns an alteration shop in [place] and wants this taken care of quickly because the pain is debilitating. Acute PT to con't to follow.  Follow Up Recommendations  No PT follow up;Supervision/Assistance - 24 hour     Equipment Recommendations  None recommended by PT    Recommendations for Other Services       Precautions / Restrictions Precautions Precautions: Back Precaution Booklet Issued: No Precaution Comments: Pt able to recall 2/3 back precautions. Required cues for no lifting back precautions.  Required Braces or Orthoses: Spinal Brace Spinal Brace: Lumbar corset Restrictions Weight Bearing Restrictions: No Other Position/Activity Restrictions: lifting restrictions    Mobility  Bed Mobility Overal bed mobility: Needs Assistance Bed Mobility: Rolling;Sidelying to Sit;Sit to Sidelying Rolling: Supervision Sidelying to sit: Min assist     Sit to sidelying: Supervision General bed mobility comments: with increased time and use of bed rail pt able to bring self to EOB with minA for trunk elevation, pt able to transfer from sit to sidelying without physical assist  Transfers Overall transfer level: Needs assistance Equipment used: Rolling walker (2 wheeled) Transfers: Sit to/from Stand Sit to Stand: Min assist         General transfer comment: v/c's for hand placement, minA for initial  power up due to bilat LE weakness and back pain  Ambulation/Gait Ambulation/Gait assistance: Min assist Ambulation Distance (Feet): 50 Feet Assistive device: Rolling walker (2 wheeled) Gait Pattern/deviations: Decreased stride length;Step-through pattern;Antalgic Gait velocity: Decreased Gait velocity interpretation: Below normal speed for age/gender General Gait Details: slow and guarded, increased bilat UE support. pt with onset of muscle spasms limiting ambulation tolerance   Stairs            Wheelchair Mobility    Modified Rankin (Stroke Patients Only)       Balance Overall balance assessment: Needs assistance Sitting-balance support: No upper extremity supported;Feet supported Sitting balance-Leahy Scale: Good Sitting balance - Comments: pt able to don LSO without difficulty or LOB   Standing balance support: Bilateral upper extremity supported;No upper extremity supported;During functional activity Standing balance-Leahy Scale: Poor Standing balance comment: requires support of UEs                            Cognition Arousal/Alertness: Awake/alert Behavior During Therapy: WFL for tasks assessed/performed Overall Cognitive Status: Within Functional Limits for tasks assessed                                        Exercises      General Comments        Pertinent Vitals/Pain Pain Assessment: 0-10 Pain Score: 10-Worst pain ever(10 with movement, 2 at rest) Pain Location: back Pain Descriptors / Indicators: Sharp Pain Intervention(s): Limited activity within patient's tolerance  Home Living                      Prior Function            PT Goals (current goals can now be found in the care plan section) Acute Rehab PT Goals Patient Stated Goal: get this fixed Progress towards PT goals: Not progressing toward goals - comment(due to pain)    Frequency    Min 3X/week      PT Plan Current plan remains  appropriate    Co-evaluation              AM-PAC PT "6 Clicks" Daily Activity  Outcome Measure  Difficulty turning over in bed (including adjusting bedclothes, sheets and blankets)?: A Lot Difficulty moving from lying on back to sitting on the side of the bed? : Unable Difficulty sitting down on and standing up from a chair with arms (e.g., wheelchair, bedside commode, etc,.)?: Unable Help needed moving to and from a bed to chair (including a wheelchair)?: A Little Help needed walking in hospital room?: A Little Help needed climbing 3-5 steps with a railing? : A Lot 6 Click Score: 12    End of Session Equipment Utilized During Treatment: Gait belt;Back brace Activity Tolerance: Patient limited by pain Patient left: in bed;with call bell/phone within reach(in sidelying with supportive pillows) Nurse Communication: Mobility status(muscle spasms) PT Visit Diagnosis: Unsteadiness on feet (R26.81);Pain Pain - part of body: (back)     Time: 1829-9371 PT Time Calculation (min) (ACUTE ONLY): 19 min  Charges:  $Gait Training: 8-22 mins                    G Codes:       [name], PT, DPT Pager #: [phone] Office #: [phone]    [place] 09/22/2017, 2:19 PM

## 2017-09-22 NOTE — Anesthesia Preprocedure Evaluation (Addendum)
Anesthesia Evaluation  Patient identified by MRN, date of birth, ID band Patient awake    Reviewed: Allergy & Precautions, NPO status , Patient's Chart, lab work & pertinent test results  History of Anesthesia Complications (+) PONV and history of anesthetic complications  Airway Mallampati: II   Neck ROM: Full    Dental  (+) Edentulous Upper, Edentulous Lower   Pulmonary neg pulmonary ROS,    breath sounds clear to auscultation       Cardiovascular Exercise Tolerance: Good hypertension, (-) angina Rhythm:Regular Rate:Tachycardia     Neuro/Psych Back pain    GI/Hepatic negative GI ROS, Neg liver ROS,   Endo/Other  Obesity  Renal/GU negative Renal ROS     Musculoskeletal  (+) Arthritis , Osteoarthritis,    Abdominal (+) + obese,   Peds  Hematology  (+) anemia ,   Anesthesia Other Findings   Reproductive/Obstetrics                            Anesthesia Physical  Anesthesia Plan  ASA: III  Anesthesia Plan: General   Post-op Pain Management:    Induction: Intravenous  PONV Risk Score and Plan: 4 or greater and Ondansetron, Dexamethasone, Midazolam, Treatment may vary due to age or medical condition and Scopolamine patch - Pre-op  Airway Management Planned: Oral ETT  Additional Equipment: None  Intra-op Plan:   Post-operative Plan: Extubation in OR  Informed Consent: I have reviewed the patients History and Physical, chart, labs and discussed the procedure including the risks, benefits and alternatives for the proposed anesthesia with the patient or authorized representative who has indicated his/her understanding and acceptance.     Plan Discussed with: CRNA  Anesthesia Plan Comments:         Anesthesia Quick Evaluation

## 2017-09-22 NOTE — Progress Notes (Signed)
Patient ID: [name], female   DOB: 27-Nov-1945, 71 y.o.   MRN: [id number] Subjective:  The patient is alert and pleasant. She complains of a headache.  Objective: Vital signs in last 24 hours: Temp:  [98.2 F (36.8 C)-99.5 F (37.5 C)] 98.4 F (36.9 C) (11/07 1352) Pulse Rate:  [83-111] 95 (11/07 1352) Resp:  [16-18] 18 (11/07 1352) BP: (128-162)/(58-89) 140/79 (11/07 1352) SpO2:  [88 %-100 %] 98 % (11/07 1352)  Intake/Output from previous day: 11/06 0701 - 11/07 0700 In: 600 [P.O.:600] Out: 300 [Urine:300] Intake/Output this shift: Total I/O In: 417 [P.O.:417] Out: -   Physical exam the patient is alert and oriented. Her strength is grossly normal in her bowel gastrocnemius and dorsiflexors. Her wound is clean and dry.  Lab Results: Recent Labs    09/20/17 0735  WBC 5.9  HGB 9.3*  HCT 29.0*  PLT 253   BMET Recent Labs    09/20/17 0859  NA 137  K 4.2  CL 101  CO2 25  GLUCOSE 117*  BUN 14  CREATININE 0.78  CALCIUM 8.8*    Studies/Results: No results found.  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 We discussed the various treatment options including continued observation persistent exploration of her lumbar wound with a repair of pseudomeningocele. I described the surgery to her. We have discussed the risks including risks of anesthesia, hemorrhage, infection, recurrent pseudomeningocele, nerve injury, medical risk, etc. I have answered all her questions. She would like to proceed with surgery. We will plan to do this tomorrow afternoon.  LOS: 2 days     Kayhan Boardley D 09/22/2017, 3:57 PM

## 2017-09-23 ENCOUNTER — Encounter (HOSPITAL_COMMUNITY)
Admission: EM | Disposition: A | Discharge status: Home Health | Payer: Self-pay | Source: Home / Self Care | Attending: Neurosurgery

## 2017-09-23 ENCOUNTER — Inpatient Hospital Stay (HOSPITAL_COMMUNITY): Payer: Medicare Other | Admitting: Anesthesiology

## 2017-09-23 ENCOUNTER — Encounter (HOSPITAL_COMMUNITY): Payer: Self-pay | Admitting: Certified Registered Nurse Anesthetist

## 2017-09-23 HISTORY — PX: LUMBAR WOUND DEBRIDEMENT: SHX1988

## 2017-09-23 LAB — MRSA PCR SCREENING: MRSA by PCR: NEGATIVE

## 2017-09-23 SURGERY — LUMBAR WOUND DEBRIDEMENT
Anesthesia: General | Site: Back

## 2017-09-23 MED ORDER — THROMBIN (RECOMBINANT) 5000 UNITS EX SOLR
OROMUCOSAL | Status: DC | PRN
  Administered 2017-09-23: 5 mL via TOPICAL

## 2017-09-23 MED ORDER — FENTANYL CITRATE (PF) 100 MCG/2ML IJ SOLN
INTRAMUSCULAR | Status: AC
  Filled 2017-09-23: qty 2

## 2017-09-23 MED ORDER — LIDOCAINE-EPINEPHRINE 1 %-1:100000 IJ SOLN
INTRAMUSCULAR | Status: AC
  Filled 2017-09-23: qty 1

## 2017-09-23 MED ORDER — MIDAZOLAM HCL 5 MG/5ML IJ SOLN
INTRAMUSCULAR | Status: DC | PRN
  Administered 2017-09-23 (×2): 1 mg via INTRAVENOUS

## 2017-09-23 MED ORDER — LIDOCAINE HCL (CARDIAC) 20 MG/ML IV SOLN
INTRAVENOUS | Status: DC | PRN
  Administered 2017-09-23: 100 mg via INTRAVENOUS

## 2017-09-23 MED ORDER — DEXAMETHASONE SODIUM PHOSPHATE 10 MG/ML IJ SOLN
INTRAMUSCULAR | Status: DC | PRN
  Administered 2017-09-23: 10 mg via INTRAVENOUS

## 2017-09-23 MED ORDER — FENTANYL CITRATE (PF) 100 MCG/2ML IJ SOLN
25.0000 ug | INTRAMUSCULAR | Status: DC | PRN
  Administered 2017-09-23 (×2): 25 ug via INTRAVENOUS
  Administered 2017-09-23: 50 ug via INTRAVENOUS
  Administered 2017-09-23: 25 ug via INTRAVENOUS

## 2017-09-23 MED ORDER — THROMBIN (RECOMBINANT) 5000 UNITS EX SOLR
CUTANEOUS | Status: AC
  Filled 2017-09-23: qty 5000

## 2017-09-23 MED ORDER — LACTATED RINGERS IV SOLN
INTRAVENOUS | Status: DC | PRN
  Administered 2017-09-23 (×2): via INTRAVENOUS

## 2017-09-23 MED ORDER — ONDANSETRON HCL 4 MG/2ML IJ SOLN
INTRAMUSCULAR | Status: DC | PRN
  Administered 2017-09-23: 4 mg via INTRAVENOUS

## 2017-09-23 MED ORDER — CEFAZOLIN SODIUM 1 G IJ SOLR
INTRAMUSCULAR | Status: AC
  Filled 2017-09-23: qty 20

## 2017-09-23 MED ORDER — ONDANSETRON HCL 4 MG/2ML IJ SOLN
INTRAMUSCULAR | Status: AC
  Filled 2017-09-23: qty 2

## 2017-09-23 MED ORDER — HEMOSTATIC AGENTS (NO CHARGE) OPTIME
TOPICAL | Status: DC | PRN
  Administered 2017-09-23: 1 via TOPICAL

## 2017-09-23 MED ORDER — DEXAMETHASONE SODIUM PHOSPHATE 10 MG/ML IJ SOLN
INTRAMUSCULAR | Status: AC
  Filled 2017-09-23: qty 1

## 2017-09-23 MED ORDER — SUGAMMADEX SODIUM 200 MG/2ML IV SOLN
INTRAVENOUS | Status: AC
  Filled 2017-09-23: qty 2

## 2017-09-23 MED ORDER — PROPOFOL 10 MG/ML IV BOLUS
INTRAVENOUS | Status: AC
  Filled 2017-09-23: qty 40

## 2017-09-23 MED ORDER — CEFAZOLIN SODIUM-DEXTROSE 2-3 GM-%(50ML) IV SOLR
INTRAVENOUS | Status: DC | PRN
  Administered 2017-09-23: 2 g via INTRAVENOUS

## 2017-09-23 MED ORDER — LIDOCAINE 2% (20 MG/ML) 5 ML SYRINGE
INTRAMUSCULAR | Status: AC
  Filled 2017-09-23: qty 5

## 2017-09-23 MED ORDER — BACITRACIN ZINC 500 UNIT/GM EX OINT
TOPICAL_OINTMENT | CUTANEOUS | Status: DC | PRN
  Administered 2017-09-23: 1 via TOPICAL

## 2017-09-23 MED ORDER — VANCOMYCIN HCL 500 MG IV SOLR
INTRAVENOUS | Status: DC | PRN
  Administered 2017-09-23: 1000 mg

## 2017-09-23 MED ORDER — PROPOFOL 10 MG/ML IV BOLUS
INTRAVENOUS | Status: DC | PRN
  Administered 2017-09-23: 150 mg via INTRAVENOUS
  Administered 2017-09-23: 50 mg via INTRAVENOUS

## 2017-09-23 MED ORDER — SUGAMMADEX SODIUM 200 MG/2ML IV SOLN
INTRAVENOUS | Status: DC | PRN
  Administered 2017-09-23: 189.8 mg via INTRAVENOUS

## 2017-09-23 MED ORDER — SODIUM CHLORIDE 0.9 % IR SOLN
Status: DC | PRN
  Administered 2017-09-23: 500 mL

## 2017-09-23 MED ORDER — PHENYLEPHRINE 40 MCG/ML (10ML) SYRINGE FOR IV PUSH (FOR BLOOD PRESSURE SUPPORT)
PREFILLED_SYRINGE | INTRAVENOUS | Status: DC | PRN
  Administered 2017-09-23 (×2): 80 ug via INTRAVENOUS
  Administered 2017-09-23 (×2): 120 ug via INTRAVENOUS
  Administered 2017-09-23: 40 ug via INTRAVENOUS

## 2017-09-23 MED ORDER — FENTANYL CITRATE (PF) 250 MCG/5ML IJ SOLN
INTRAMUSCULAR | Status: AC
  Filled 2017-09-23: qty 5

## 2017-09-23 MED ORDER — BACITRACIN ZINC 500 UNIT/GM EX OINT
TOPICAL_OINTMENT | CUTANEOUS | Status: AC
  Filled 2017-09-23: qty 28.35

## 2017-09-23 MED ORDER — ROCURONIUM BROMIDE 10 MG/ML (PF) SYRINGE
PREFILLED_SYRINGE | INTRAVENOUS | Status: AC
  Filled 2017-09-23: qty 5

## 2017-09-23 MED ORDER — BUPIVACAINE-EPINEPHRINE (PF) 0.5% -1:200000 IJ SOLN
INTRAMUSCULAR | Status: AC
  Filled 2017-09-23: qty 30

## 2017-09-23 MED ORDER — EPHEDRINE 5 MG/ML INJ
INTRAVENOUS | Status: AC
  Filled 2017-09-23: qty 10

## 2017-09-23 MED ORDER — PHENYLEPHRINE 40 MCG/ML (10ML) SYRINGE FOR IV PUSH (FOR BLOOD PRESSURE SUPPORT)
PREFILLED_SYRINGE | INTRAVENOUS | Status: AC
  Filled 2017-09-23: qty 10

## 2017-09-23 MED ORDER — MIDAZOLAM HCL 2 MG/2ML IJ SOLN
INTRAMUSCULAR | Status: AC
  Filled 2017-09-23: qty 2

## 2017-09-23 MED ORDER — ROCURONIUM BROMIDE 100 MG/10ML IV SOLN
INTRAVENOUS | Status: DC | PRN
  Administered 2017-09-23: 50 mg via INTRAVENOUS

## 2017-09-23 MED ORDER — ONDANSETRON HCL 4 MG/2ML IJ SOLN
4.0000 mg | Freq: Once | INTRAMUSCULAR | Status: AC | PRN
  Administered 2017-09-23: 4 mg via INTRAVENOUS

## 2017-09-23 MED ORDER — SUCCINYLCHOLINE CHLORIDE 200 MG/10ML IV SOSY
PREFILLED_SYRINGE | INTRAVENOUS | Status: AC
  Filled 2017-09-23: qty 10

## 2017-09-23 MED ORDER — FENTANYL CITRATE (PF) 100 MCG/2ML IJ SOLN
INTRAMUSCULAR | Status: DC | PRN
  Administered 2017-09-23: 100 ug via INTRAVENOUS
  Administered 2017-09-23 (×3): 50 ug via INTRAVENOUS

## 2017-09-23 MED ORDER — THROMBIN (RECOMBINANT) 5000 UNITS EX SOLR
CUTANEOUS | Status: AC
  Filled 2017-09-23: qty 10000

## 2017-09-23 MED ORDER — VANCOMYCIN HCL 1000 MG IV SOLR
INTRAVENOUS | Status: AC
  Filled 2017-09-23: qty 1000

## 2017-09-23 SURGICAL SUPPLY — 53 items
APL SKNCLS STERI-STRIP NONHPOA (GAUZE/BANDAGES/DRESSINGS) ×1
APL SRG 60D 8 XTD TIP BNDBL (TIP) ×1
BAG DECANTER FOR FLEXI CONT (MISCELLANEOUS) ×2 IMPLANT
BENZOIN TINCTURE PRP APPL 2/3 (GAUZE/BANDAGES/DRESSINGS) ×2 IMPLANT
BLADE CLIPPER SURG (BLADE) IMPLANT
CANISTER SUCT 3000ML PPV (MISCELLANEOUS) ×2 IMPLANT
CARTRIDGE OIL MAESTRO DRILL (MISCELLANEOUS) ×1 IMPLANT
CLSR STERI-STRIP ANTIMIC 1/2X4 (GAUZE/BANDAGES/DRESSINGS) ×1 IMPLANT
DIFFUSER DRILL AIR PNEUMATIC (MISCELLANEOUS) ×2 IMPLANT
DRAPE LAPAROTOMY 100X72X124 (DRAPES) ×2 IMPLANT
DRAPE POUCH INSTRU U-SHP 10X18 (DRAPES) ×2 IMPLANT
DRAPE SURG 17X23 STRL (DRAPES) ×8 IMPLANT
DURASEAL APPLICATOR TIP (TIP) ×1 IMPLANT
DURASEAL SPINE SEALANT 3ML (MISCELLANEOUS) ×1 IMPLANT
ELECT REM PT RETURN 9FT ADLT (ELECTROSURGICAL) ×2
ELECTRODE REM PT RTRN 9FT ADLT (ELECTROSURGICAL) ×1 IMPLANT
EVACUATOR 3/16  PVC DRAIN (DRAIN)
EVACUATOR 3/16 PVC DRAIN (DRAIN) IMPLANT
GAUZE SPONGE 4X4 12PLY STRL (GAUZE/BANDAGES/DRESSINGS) ×1 IMPLANT
GAUZE SPONGE 4X4 12PLY STRL LF (GAUZE/BANDAGES/DRESSINGS) ×1 IMPLANT
GAUZE SPONGE 4X4 16PLY XRAY LF (GAUZE/BANDAGES/DRESSINGS) IMPLANT
GLOVE BIO SURGEON STRL SZ8 (GLOVE) ×2 IMPLANT
GLOVE BIO SURGEON STRL SZ8.5 (GLOVE) ×2 IMPLANT
GLOVE BIOGEL PI IND STRL 7.5 (GLOVE) IMPLANT
GLOVE BIOGEL PI INDICATOR 7.5 (GLOVE) ×1
GLOVE EXAM NITRILE LRG STRL (GLOVE) IMPLANT
GLOVE EXAM NITRILE XL STR (GLOVE) IMPLANT
GLOVE EXAM NITRILE XS STR PU (GLOVE) IMPLANT
GLOVE SURG SS PI 7.0 STRL IVOR (GLOVE) ×1 IMPLANT
GOWN STRL REUS W/ TWL LRG LVL3 (GOWN DISPOSABLE) IMPLANT
GOWN STRL REUS W/ TWL XL LVL3 (GOWN DISPOSABLE) IMPLANT
GOWN STRL REUS W/TWL LRG LVL3 (GOWN DISPOSABLE) ×2
GOWN STRL REUS W/TWL XL LVL3 (GOWN DISPOSABLE) ×2
GRAFT DURAGEN MATRIX 1WX1L (Tissue) ×2 IMPLANT
KIT BASIN OR (CUSTOM PROCEDURE TRAY) ×2 IMPLANT
KIT ROOM TURNOVER OR (KITS) ×2 IMPLANT
NEEDLE HYPO 22GX1.5 SAFETY (NEEDLE) IMPLANT
NS IRRIG 1000ML POUR BTL (IV SOLUTION) ×2 IMPLANT
OIL CARTRIDGE MAESTRO DRILL (MISCELLANEOUS) ×2
PACK LAMINECTOMY NEURO (CUSTOM PROCEDURE TRAY) ×2 IMPLANT
PAD ARMBOARD 7.5X6 YLW CONV (MISCELLANEOUS) ×8 IMPLANT
STRIP CLOSURE SKIN 1/2X4 (GAUZE/BANDAGES/DRESSINGS) ×2 IMPLANT
SUT ETHILON 2 0 PSLX (SUTURE) ×2 IMPLANT
SUT PROLENE 6 0 BV (SUTURE) ×2 IMPLANT
SUT VIC AB 1 CT1 18XBRD ANBCTR (SUTURE) ×1 IMPLANT
SUT VIC AB 1 CT1 8-18 (SUTURE) ×4
SUT VIC AB 2-0 CP2 18 (SUTURE) ×2 IMPLANT
SWAB COLLECTION DEVICE MRSA (MISCELLANEOUS) ×1 IMPLANT
SWAB CULTURE ESWAB REG 1ML (MISCELLANEOUS) ×1 IMPLANT
TAPE CLOTH SURG 4X10 WHT LF (GAUZE/BANDAGES/DRESSINGS) ×1 IMPLANT
TOWEL GREEN STERILE (TOWEL DISPOSABLE) ×2 IMPLANT
TOWEL GREEN STERILE FF (TOWEL DISPOSABLE) ×2 IMPLANT
WATER STERILE IRR 1000ML POUR (IV SOLUTION) ×2 IMPLANT

## 2017-09-23 NOTE — Anesthesia Postprocedure Evaluation (Signed)
Anesthesia Post Note  Patient: [name]  Procedure(s) Performed: Exploration of Lumbar Wound; Repair of Pseudomeningocele (N/A Back)     Patient location during evaluation: PACU Anesthesia Type: General Level of consciousness: awake and alert Pain management: pain level controlled Vital Signs Assessment: post-procedure vital signs reviewed and stable Respiratory status: spontaneous breathing, nonlabored ventilation and respiratory function stable Cardiovascular status: blood pressure returned to baseline and stable Postop Assessment: no apparent nausea or vomiting Anesthetic complications: no    Last Vitals:  Vitals:   09/23/17 0905 09/23/17 0915  BP: 135/78 (!) 142/59  Pulse: 90 91  Resp: 17 20  Temp: (!) 36.4 C   SpO2: 99% 95%    Last Pain:  Vitals:   09/23/17 0915  TempSrc:   PainSc: [place]

## 2017-09-23 NOTE — Anesthesia Procedure Notes (Signed)
Procedure Name: Intubation Date/Time: 09/23/2017 7:37 AM Performed by: Lowella Dell, CRNA Pre-anesthesia Checklist: Patient identified, Emergency Drugs available, Suction available and Patient being monitored Patient Re-evaluated:Patient Re-evaluated prior to induction Oxygen Delivery Method: Circle System Utilized Preoxygenation: Pre-oxygenation with 100% oxygen Induction Type: IV induction Ventilation: Mask ventilation without difficulty Laryngoscope Size: Mac and 3 Grade View: Grade I Tube type: Oral Tube size: 7.0 mm Number of attempts: 1 Airway Equipment and Method: Stylet Placement Confirmation: ETT inserted through vocal cords under direct vision,  positive ETCO2 and breath sounds checked- equal and bilateral Secured at: 20 cm Tube secured with: Tape Dental Injury: Teeth and Oropharynx as per pre-operative assessment

## 2017-09-23 NOTE — Op Note (Signed)
Brief history: The patient is a 71 year old white female on whom I performed a lumbar fusion about 10 days ago.  She has developed increasing back pain with a headache.  A lumbar MRI demonstrated an epidural fluid collection with stenosis.  I suspected a pseudomeningocele.  We discussed the various treatment options including an exploration of her wound with a possible repair of pseudomeningocele.  The patient has weighed the risks, benefits, and alternatives to surgery and decided to proceed with the operation.  Preop diagnosis: Epidural fluid collection  Postop diagnosis: The same, resolving hematoma  Procedure: Exploration of lumbar wound, evacuation of epidural hematoma  Surgeon: Dr. Earle Gell  Assistant: None  Anesthesia: General endotracheal  Estimated blood loss: Minimal  Specimens: Wound cultures  Drains: None  Complications: None  Description of procedure: The patient was brought to the operating room by the anesthesia team.  General endotracheal anesthesia was induced.  The patient was turned to the prone position on the skin frame.  Her lumbosacral region was then prepared with Betadine scrub and Betadine solution.  Sterile drapes were applied.  I then used a scalpel to incise through the patient's fresh surgical scar.  Encountered a liquefied fluid collection in the subcutaneous space consistent with a resolving hematoma.  We obtained wound cultures, although it did not appear to be infected.  We evacuated it with suction.  I used the Metzenbaum scissors to cut the sutures and the patient's lumbar fascia.  We used cerebellar retractors for exposure.  We encountered more motor oil like fluid in the epidural space.  I removed it with suction.  I inspected the exposed dura at L2-3 and at L4-5 and L5-S1.  The dura was somewhat thin at L4-5 on the right just cephalad to the cross connector.  We had anesthesia Valsalva the patient several times but did not see any spinal fluid leak.   I did not think I could get a secure suture in the thin door so I placed Dura-Guard over the thin dura.  We then covered that with DuraSeal.  We then removed the retractors.  I reapproximated patient's thoracolumbar fascia with interrupted #1 Vicryl suture.  I placed vancomycin powder in the subcutaneous tissue.  We reapproximate the subcutaneous tissue with interrupted 2-0 Vicryl suture.  We reapproximated the skin with a running 2-0 nylon suture.  The wound was then coated with bacitracin ointment.  A sterile dressing was applied.  The drapes were removed.  The patient was subsequently returned to the supine position.  By report all sponge, instrument, and needle counts were correct at the end of this case.

## 2017-09-23 NOTE — Progress Notes (Signed)
Subjective:  The patient is alert and pleasant and in no apparent distress.  Objective: Vital signs in last 24 hours: Temp:  [97.5 F (36.4 C)-98.8 F (37.1 C)] 97.9 F (36.6 C) (11/08 0430) Pulse Rate:  [87-98] 91 (11/08 0430) Resp:  [17-18] 18 (11/08 0430) BP: (111-140)/(46-79) 133/66 (11/08 0430) SpO2:  [88 %-98 %] 95 % (11/08 0430)  Intake/Output from previous day: 11/07 0701 - 11/08 0700 In: 417 [P.O.:417] Out: -  Intake/Output this shift: No intake/output data recorded.  Physical exam the patient is alert and pleasant. She is moving her lower extremities well.  Lab Results: Recent Labs    09/20/17 0735  WBC 5.9  HGB 9.3*  HCT 29.0*  PLT 253   BMET Recent Labs    09/20/17 0859  NA 137  K 4.2  CL 101  CO2 25  GLUCOSE 117*  BUN 14  CREATININE 0.78  CALCIUM 8.8*    Studies/Results: No results found.  Assessment/Plan: Lumbar pseudomeningocele: I have discussed the situation with the patient. We have discussed the various treatment options including exploration of her lumbar wound and likely repair of pseudomeningocele. I have answered all her questions regarding surgery. She wants to proceed with the operation.  LOS: 3 days     Genola Yuille D 09/23/2017, 7:15 AM

## 2017-09-23 NOTE — Transfer of Care (Signed)
Immediate Anesthesia Transfer of Care Note  Patient: [name]  Procedure(s) Performed: Exploration of Lumbar Wound; Repair of Pseudomeningocele (N/A Back)  Patient Location: PACU  Anesthesia Type:General  Level of Consciousness: awake, alert , oriented and patient cooperative  Airway & Oxygen Therapy: Patient Spontanous Breathing and Patient connected to nasal cannula oxygen  Post-op Assessment: Report given to RN and Post -op Vital signs reviewed and stable, moves extremities x4  Post vital signs: Reviewed and stable  Last Vitals:  Vitals:   09/23/17 0003 09/23/17 0430  BP: (!) 118/50 133/66  Pulse: 98 91  Resp: 18 18  Temp: (!) 36.4 C 36.6 C  SpO2: 93% 95%    Last Pain:  Vitals:   09/23/17 0545  TempSrc:   PainSc: 0-No pain      Patients Stated Pain Goal: 2 (58/85/02 7741)  Complications: No apparent anesthesia complications

## 2017-09-24 ENCOUNTER — Encounter (HOSPITAL_COMMUNITY): Payer: Self-pay | Admitting: Neurosurgery

## 2017-09-24 NOTE — Care Management Important Message (Signed)
Important Message  Patient Details  Name: [name] MRN: [id number] Date of Birth: May 20, 1946   Medicare Important Message Given:  Yes    Nathen May 09/24/2017, 10:19 AM

## 2017-09-24 NOTE — Progress Notes (Signed)
PT Cancellation Note  Patient Details Name: [name] MRN: [id number] DOB: 25-Feb-1946   Cancelled Treatment:    Reason Eval/Treat Not Completed: Medical issues which prohibited therapy. MD order 09/23/17 1052 for bedrest x 48 hours.   Lorriane Shire 09/24/2017, 8:40 AM

## 2017-09-25 NOTE — Progress Notes (Signed)
No new events or problems. Beginning to mobilize. Patient states back pain is controlled. No lower extremity pain.  She is afebrile. Vital signs are stable. Urine output good. Awake and alert. Oriented and appropriate. Wound dressing dry. Motor and sensory function stable.  Overall making progress. Mobilize today.

## 2017-09-25 NOTE — Progress Notes (Signed)
PT Cancellation Note  Patient Details Name: [name] MRN: [id number] DOB: 12-28-1945   Cancelled Treatment:    Reason Eval/Treat Not Completed: Other (comment).  No new PT orders post surgery so will ck again tomorrow to see how this is progressing.   Ramond Dial 09/25/2017, 10:11 AM   Mee Hives, PT MS Acute Rehab Dept. Number: [place] [facility] [place]

## 2017-09-26 MED ORDER — POLYETHYLENE GLYCOL 3350 17 G PO PACK
17.0000 g | PACK | Freq: Every day | ORAL | Status: DC
  Administered 2017-09-26 – 2017-09-27 (×2): 17 g via ORAL
  Filled 2017-09-26 (×2): qty 1

## 2017-09-26 MED ORDER — FLEET ENEMA 7-19 GM/118ML RE ENEM: 1.0000 | ENEMA | Freq: Every day | RECTAL | Status: DC | PRN

## 2017-09-26 MED ORDER — BISACODYL 10 MG RE SUPP: 10.0000 mg | Freq: Every day | RECTAL | Status: DC | PRN

## 2017-09-26 NOTE — Progress Notes (Signed)
Patient doing a little better today. Still having significant back pain. Reports only mild headache. No wound drainage. No new lower extremity symptoms. Did not mobilize with therapy yesterday.  Afebrile. Vitals are stable. Wound dressing clean and dry. Motor sensory exam stable.  Progressing slowly. Continue efforts at mobilization. We'll work on patient's constipation. Possible discharge tomorrow.

## 2017-09-26 NOTE — Plan of Care (Signed)
Patient still having pain in lower back that is relatively controlled with pain medication.  Patient has been able to get OOB and ambulate to BR without any difficulty.  Patient states she is feeling better than she has been.  Neuro checks are negative for any issues.  P.J. Linus Mako, RN

## 2017-09-27 MED ORDER — OXYCODONE-ACETAMINOPHEN 5-325 MG PO TABS
1.0000 | ORAL_TABLET | ORAL | Status: DC | PRN
  Administered 2017-09-27: 2 via ORAL
  Administered 2017-09-27 – 2017-09-28 (×3): 1 via ORAL
  Filled 2017-09-27: qty 2
  Filled 2017-09-27 (×3): qty 1

## 2017-09-27 NOTE — Plan of Care (Signed)
Patient ambulating with walker in room and tolerating well.  Patient's pain managed well with Oxycodone.  RN will continue to monitor patient and report any issues to MD as needed. P.J. Linus Mako, RN

## 2017-09-27 NOTE — Progress Notes (Signed)
Patient ID: [name], female   DOB: October 22, 1946, 71 y.o.   MRN: [id number] Subjective: The patient is alert and pleasant.  She looks well.  She wants to go home tomorrow.  Objective: Vital signs in last 24 hours: Temp:  [98 F (36.7 C)-98.5 F (36.9 C)] 98.5 F (36.9 C) (11/12 0630) Pulse Rate:  [86-116] 86 (11/12 0630) Resp:  [16-18] 18 (11/12 0630) BP: (98-135)/(58-69) 120/60 (11/12 0630) SpO2:  [94 %-100 %] 98 % (11/12 0630)  Intake/Output from previous day: 11/11 0701 - 11/12 0700 In: 360 [P.O.:360] Out: -  Intake/Output this shift: No intake/output data recorded.  Physical exam the patient is alert and oriented.  Her dressing is clean and dry.  Her strength is normal on the lower extremities.  Lab Results: No results for input(s): WBC, HGB, HCT, PLT in the last 72 hours. BMET No results for input(s): NA, K, CL, CO2, GLUCOSE, BUN, CREATININE, CALCIUM in the last 72 hours.  Studies/Results: No results found.  Assessment/Plan: Status post evacuation of lumbar epidural hematoma: The patient is doing well.  We will plan to send her home tomorrow.  LOS: 7 days     Hibo Blasdell D 09/27/2017, 7:53 AM

## 2017-09-27 NOTE — Consult Note (Signed)
   [facility] Inpatient Consult   09/27/2017  MARNY SMETHERS 07-29-1946 [id number]   Patient screened for potential [facility] Care Management needs for readmission.  Chart reviewed. No [facility] Care Management needs identified.   Marthenia Rolling, MSN-Ed, RN,BSN [facility] Liaison [phone]

## 2017-09-28 LAB — AEROBIC/ANAEROBIC CULTURE (SURGICAL/DEEP WOUND)

## 2017-09-28 LAB — AEROBIC/ANAEROBIC CULTURE W GRAM STAIN (SURGICAL/DEEP WOUND): Culture: NO GROWTH

## 2017-09-28 MED ORDER — OXYCODONE-ACETAMINOPHEN 5-325 MG PO TABS: 1.0000 | ORAL_TABLET | ORAL | 0 refills | Status: DC | PRN

## 2017-09-28 NOTE — Progress Notes (Signed)
PT Cancellation Note/Discharge Note  Patient Details Name: [name] MRN: [id number] DOB: 10-May-1946   Cancelled Treatment:    Reason Eval/Treat Not Completed: Other (comment). Spoke with Dr. Arnoldo Morale. He doesn't feel Acute PT is warrented anymore so no new orders were placed after recent surgery. Pt discharged from acute PT services at this time. Please re-consult if needed in future. Thank you.   [name] M [name] 09/28/2017, 8:18 AM   Kittie Plater, PT, DPT Pager #: [phone] Office #: [phone]

## 2017-09-28 NOTE — Progress Notes (Signed)
[name] to be D/C'd to home per MD order.  Discussed with the patient and all questions fully answered. Patient feels that she doesn't need hospital to help her set up home health and that if she needs home health assistance she will be able to get it herself.  An After Visit Summary was printed and given to the patient. Patient received prescription.  D/c education completed with patient/family including follow up instructions, medication list, d/c activities limitations if indicated, with other d/c instructions as indicated by MD - patient able to verbalize understanding, all questions fully answered.   Patient instructed to return to ED, call 911, or call MD for any changes in condition.   Patient escorted via [place], and D/C home via private auto.  Jayke Caul L Price 09/28/2017 9:05 AM

## 2017-09-28 NOTE — Discharge Summary (Signed)
Physician Discharge Summary  Patient ID: [name] MRN: [id number] DOB/AGE: [birth year] 71 y.o.  Admit date: 09/20/2017 Discharge date: 09/28/2017  Admission Diagnoses: back pain, epidural fluid collection  Discharge Diagnoses: The same and epidural hematoma Active Problems:   Post-operative pain   Postprocedural pseudomeningocele   Discharged Condition: good  Hospital Course: I performed an exploration of the patient's lumbar wound with evacuation of epidural hematoma.  The surgery went well.  The patient's wound cultures were negative.  The patient's postoperative course was unremarkable.  She was progressively mobilized.  She requested discharge to home on 09/28/2017.  She was given discharge instructions.  All her questions were answered.  Consults: Physical therapy Significant Diagnostic Studies: Lumbar MRI Treatments: Exploration of lumbar wound, evacuation of epidural hematoma Discharge Exam: Blood pressure 139/71, pulse 91, temperature 98.3 F (36.8 C), temperature source Oral, resp. rate 18, height 5\' 4"  (1.626 m), weight 94.9 kg (209 lb 4.8 oz), SpO2 95 %. The patient is alert and pleasant.  She looks well.  Her dressing is clean and dry.  Her strength is grossly normal in her lower extremities.  Disposition: Home  Discharge Instructions    Call MD for:  difficulty breathing, headache or visual disturbances   Complete by:  As directed    Call MD for:  extreme fatigue   Complete by:  As directed    Call MD for:  hives   Complete by:  As directed    Call MD for:  persistant dizziness or light-headedness   Complete by:  As directed    Call MD for:  persistant nausea and vomiting   Complete by:  As directed    Call MD for:  redness, tenderness, or signs of infection (pain, swelling, redness, odor or green/yellow discharge around incision site)   Complete by:  As directed    Call MD for:  severe uncontrolled pain   Complete by:  As directed    Call MD for:   temperature >100.4   Complete by:  As directed    Diet - low sodium heart healthy   Complete by:  As directed    Discharge instructions   Complete by:  As directed    Call [phone] for a followup appointment. Take a stool softener while you are using pain medications.   Driving Restrictions   Complete by:  As directed    Do not drive for 2 weeks.   Increase activity slowly   Complete by:  As directed    Lifting restrictions   Complete by:  As directed    Do not lift more than 5 pounds. No excessive bending or twisting.   May shower / Bathe   Complete by:  As directed    He may shower after the pain she is removed 3 days after surgery. Leave the incision alone.   No dressing needed   Complete by:  As directed         Signed: Tiarrah Saville D 09/28/2017, 7:49 AM

## 2017-09-30 ENCOUNTER — Encounter (HOSPITAL_COMMUNITY): Payer: Self-pay | Admitting: Neurosurgery

## 2017-10-05 DIAGNOSIS — M48062 Spinal stenosis, lumbar region with neurogenic claudication: Secondary | ICD-10-CM | POA: Diagnosis not present

## 2017-11-16 DIAGNOSIS — I639 Cerebral infarction, unspecified: Secondary | ICD-10-CM

## 2017-11-16 HISTORY — DX: Cerebral infarction, unspecified: I63.9

## 2017-12-15 DIAGNOSIS — M545 Low back pain: Secondary | ICD-10-CM | POA: Diagnosis not present

## 2017-12-15 DIAGNOSIS — M79604 Pain in right leg: Secondary | ICD-10-CM | POA: Diagnosis not present

## 2017-12-15 DIAGNOSIS — M79605 Pain in left leg: Secondary | ICD-10-CM | POA: Diagnosis not present

## 2017-12-15 DIAGNOSIS — M6281 Muscle weakness (generalized): Secondary | ICD-10-CM | POA: Diagnosis not present

## 2017-12-17 DIAGNOSIS — M79604 Pain in right leg: Secondary | ICD-10-CM | POA: Diagnosis not present

## 2017-12-17 DIAGNOSIS — M6281 Muscle weakness (generalized): Secondary | ICD-10-CM | POA: Diagnosis not present

## 2017-12-17 DIAGNOSIS — M545 Low back pain: Secondary | ICD-10-CM | POA: Diagnosis not present

## 2017-12-17 DIAGNOSIS — M79605 Pain in left leg: Secondary | ICD-10-CM | POA: Diagnosis not present

## 2017-12-20 DIAGNOSIS — M79605 Pain in left leg: Secondary | ICD-10-CM | POA: Diagnosis not present

## 2017-12-20 DIAGNOSIS — M545 Low back pain: Secondary | ICD-10-CM | POA: Diagnosis not present

## 2017-12-20 DIAGNOSIS — M6281 Muscle weakness (generalized): Secondary | ICD-10-CM | POA: Diagnosis not present

## 2017-12-20 DIAGNOSIS — M79604 Pain in right leg: Secondary | ICD-10-CM | POA: Diagnosis not present

## 2017-12-23 DIAGNOSIS — E782 Mixed hyperlipidemia: Secondary | ICD-10-CM | POA: Diagnosis not present

## 2017-12-23 DIAGNOSIS — M79605 Pain in left leg: Secondary | ICD-10-CM | POA: Diagnosis not present

## 2017-12-23 DIAGNOSIS — R7301 Impaired fasting glucose: Secondary | ICD-10-CM | POA: Diagnosis not present

## 2017-12-23 DIAGNOSIS — M79604 Pain in right leg: Secondary | ICD-10-CM | POA: Diagnosis not present

## 2017-12-23 DIAGNOSIS — M545 Low back pain: Secondary | ICD-10-CM | POA: Diagnosis not present

## 2017-12-23 DIAGNOSIS — I10 Essential (primary) hypertension: Secondary | ICD-10-CM | POA: Diagnosis not present

## 2017-12-23 DIAGNOSIS — D509 Iron deficiency anemia, unspecified: Secondary | ICD-10-CM | POA: Diagnosis not present

## 2017-12-23 DIAGNOSIS — M6281 Muscle weakness (generalized): Secondary | ICD-10-CM | POA: Diagnosis not present

## 2017-12-27 DIAGNOSIS — I10 Essential (primary) hypertension: Secondary | ICD-10-CM | POA: Diagnosis not present

## 2017-12-27 DIAGNOSIS — M6281 Muscle weakness (generalized): Secondary | ICD-10-CM | POA: Diagnosis not present

## 2017-12-31 DIAGNOSIS — R030 Elevated blood-pressure reading, without diagnosis of hypertension: Secondary | ICD-10-CM | POA: Diagnosis not present

## 2017-12-31 DIAGNOSIS — M5441 Lumbago with sciatica, right side: Secondary | ICD-10-CM | POA: Diagnosis not present

## 2017-12-31 DIAGNOSIS — M5442 Lumbago with sciatica, left side: Secondary | ICD-10-CM | POA: Diagnosis not present

## 2017-12-31 DIAGNOSIS — M4316 Spondylolisthesis, lumbar region: Secondary | ICD-10-CM | POA: Diagnosis not present

## 2018-03-14 DIAGNOSIS — R030 Elevated blood-pressure reading, without diagnosis of hypertension: Secondary | ICD-10-CM | POA: Diagnosis not present

## 2018-03-14 DIAGNOSIS — M48062 Spinal stenosis, lumbar region with neurogenic claudication: Secondary | ICD-10-CM | POA: Diagnosis not present

## 2018-03-31 DIAGNOSIS — E119 Type 2 diabetes mellitus without complications: Secondary | ICD-10-CM | POA: Diagnosis not present

## 2018-03-31 DIAGNOSIS — E782 Mixed hyperlipidemia: Secondary | ICD-10-CM | POA: Diagnosis not present

## 2018-04-04 DIAGNOSIS — R7301 Impaired fasting glucose: Secondary | ICD-10-CM | POA: Diagnosis not present

## 2018-04-04 DIAGNOSIS — E782 Mixed hyperlipidemia: Secondary | ICD-10-CM | POA: Diagnosis not present

## 2018-04-04 DIAGNOSIS — I10 Essential (primary) hypertension: Secondary | ICD-10-CM | POA: Diagnosis not present

## 2018-04-04 DIAGNOSIS — M6281 Muscle weakness (generalized): Secondary | ICD-10-CM | POA: Diagnosis not present

## 2018-04-04 DIAGNOSIS — D509 Iron deficiency anemia, unspecified: Secondary | ICD-10-CM | POA: Diagnosis not present

## 2018-04-18 ENCOUNTER — Other Ambulatory Visit: Payer: Self-pay | Admitting: Internal Medicine

## 2018-04-18 DIAGNOSIS — Z1231 Encounter for screening mammogram for malignant neoplasm of breast: Secondary | ICD-10-CM

## 2018-04-25 DIAGNOSIS — M79606 Pain in leg, unspecified: Secondary | ICD-10-CM | POA: Diagnosis not present

## 2018-04-25 DIAGNOSIS — I10 Essential (primary) hypertension: Secondary | ICD-10-CM | POA: Diagnosis not present

## 2018-04-25 DIAGNOSIS — M792 Neuralgia and neuritis, unspecified: Secondary | ICD-10-CM | POA: Diagnosis not present

## 2018-05-09 ENCOUNTER — Ambulatory Visit
Admission: RE | Admit: 2018-05-09 | Discharge: 2018-05-09 | Disposition: A | Discharge status: Home / Self Care | Payer: Medicare Other | Source: Ambulatory Visit | Attending: Internal Medicine | Admitting: Internal Medicine

## 2018-05-09 DIAGNOSIS — Z1231 Encounter for screening mammogram for malignant neoplasm of breast: Secondary | ICD-10-CM

## 2018-05-13 DIAGNOSIS — M79604 Pain in right leg: Secondary | ICD-10-CM | POA: Diagnosis not present

## 2018-05-13 DIAGNOSIS — M79605 Pain in left leg: Secondary | ICD-10-CM | POA: Diagnosis not present

## 2018-05-13 DIAGNOSIS — R200 Anesthesia of skin: Secondary | ICD-10-CM | POA: Diagnosis not present

## 2018-06-08 DIAGNOSIS — G589 Mononeuropathy, unspecified: Secondary | ICD-10-CM | POA: Diagnosis not present

## 2018-06-08 DIAGNOSIS — M792 Neuralgia and neuritis, unspecified: Secondary | ICD-10-CM | POA: Diagnosis not present

## 2018-06-08 DIAGNOSIS — I10 Essential (primary) hypertension: Secondary | ICD-10-CM | POA: Diagnosis not present

## 2018-06-08 DIAGNOSIS — M79669 Pain in unspecified lower leg: Secondary | ICD-10-CM | POA: Diagnosis not present

## 2018-06-28 DIAGNOSIS — M48062 Spinal stenosis, lumbar region with neurogenic claudication: Secondary | ICD-10-CM | POA: Diagnosis not present

## 2018-06-28 DIAGNOSIS — Z79899 Other long term (current) drug therapy: Secondary | ICD-10-CM | POA: Diagnosis not present

## 2018-06-28 DIAGNOSIS — M461 Sacroiliitis, not elsewhere classified: Secondary | ICD-10-CM | POA: Diagnosis not present

## 2018-06-28 DIAGNOSIS — M961 Postlaminectomy syndrome, not elsewhere classified: Secondary | ICD-10-CM | POA: Diagnosis not present

## 2018-07-04 DIAGNOSIS — R0981 Nasal congestion: Secondary | ICD-10-CM | POA: Diagnosis not present

## 2018-07-04 DIAGNOSIS — R509 Fever, unspecified: Secondary | ICD-10-CM | POA: Diagnosis not present

## 2018-07-04 DIAGNOSIS — R05 Cough: Secondary | ICD-10-CM | POA: Diagnosis not present

## 2018-07-04 DIAGNOSIS — J329 Chronic sinusitis, unspecified: Secondary | ICD-10-CM | POA: Diagnosis not present

## 2018-07-04 DIAGNOSIS — R0602 Shortness of breath: Secondary | ICD-10-CM | POA: Diagnosis not present

## 2018-07-15 DIAGNOSIS — R030 Elevated blood-pressure reading, without diagnosis of hypertension: Secondary | ICD-10-CM | POA: Diagnosis not present

## 2018-07-15 DIAGNOSIS — M461 Sacroiliitis, not elsewhere classified: Secondary | ICD-10-CM | POA: Diagnosis not present

## 2018-08-04 ENCOUNTER — Encounter (HOSPITAL_COMMUNITY): Payer: Self-pay | Admitting: Emergency Medicine

## 2018-08-04 ENCOUNTER — Emergency Department (HOSPITAL_COMMUNITY): Payer: Medicare Other

## 2018-08-04 ENCOUNTER — Inpatient Hospital Stay (HOSPITAL_COMMUNITY)
Admission: EM | Admit: 2018-08-04 | Discharge: 2018-08-05 | DRG: 811 | Disposition: A | Discharge status: Home / Self Care | Payer: Medicare Other | Attending: Internal Medicine | Admitting: Internal Medicine

## 2018-08-04 ENCOUNTER — Observation Stay (HOSPITAL_COMMUNITY): Payer: Medicare Other

## 2018-08-04 ENCOUNTER — Other Ambulatory Visit: Payer: Self-pay

## 2018-08-04 DIAGNOSIS — E669 Obesity, unspecified: Secondary | ICD-10-CM | POA: Diagnosis present

## 2018-08-04 DIAGNOSIS — Z9841 Cataract extraction status, right eye: Secondary | ICD-10-CM

## 2018-08-04 DIAGNOSIS — Z961 Presence of intraocular lens: Secondary | ICD-10-CM | POA: Diagnosis present

## 2018-08-04 DIAGNOSIS — Z8249 Family history of ischemic heart disease and other diseases of the circulatory system: Secondary | ICD-10-CM | POA: Diagnosis not present

## 2018-08-04 DIAGNOSIS — M545 Low back pain: Secondary | ICD-10-CM | POA: Diagnosis present

## 2018-08-04 DIAGNOSIS — I6381 Other cerebral infarction due to occlusion or stenosis of small artery: Secondary | ICD-10-CM | POA: Diagnosis not present

## 2018-08-04 DIAGNOSIS — R51 Headache: Secondary | ICD-10-CM | POA: Diagnosis not present

## 2018-08-04 DIAGNOSIS — M79606 Pain in leg, unspecified: Secondary | ICD-10-CM | POA: Diagnosis not present

## 2018-08-04 DIAGNOSIS — R202 Paresthesia of skin: Secondary | ICD-10-CM | POA: Diagnosis present

## 2018-08-04 DIAGNOSIS — Z79899 Other long term (current) drug therapy: Secondary | ICD-10-CM

## 2018-08-04 DIAGNOSIS — G5621 Lesion of ulnar nerve, right upper limb: Secondary | ICD-10-CM | POA: Diagnosis not present

## 2018-08-04 DIAGNOSIS — Z885 Allergy status to narcotic agent status: Secondary | ICD-10-CM

## 2018-08-04 DIAGNOSIS — G8929 Other chronic pain: Secondary | ICD-10-CM | POA: Diagnosis not present

## 2018-08-04 DIAGNOSIS — M549 Dorsalgia, unspecified: Secondary | ICD-10-CM | POA: Diagnosis not present

## 2018-08-04 DIAGNOSIS — I639 Cerebral infarction, unspecified: Secondary | ICD-10-CM | POA: Diagnosis not present

## 2018-08-04 DIAGNOSIS — G9349 Other encephalopathy: Secondary | ICD-10-CM | POA: Diagnosis not present

## 2018-08-04 DIAGNOSIS — G9389 Other specified disorders of brain: Secondary | ICD-10-CM | POA: Diagnosis present

## 2018-08-04 DIAGNOSIS — I10 Essential (primary) hypertension: Secondary | ICD-10-CM | POA: Diagnosis present

## 2018-08-04 DIAGNOSIS — I63019 Cerebral infarction due to thrombosis of unspecified vertebral artery: Secondary | ICD-10-CM | POA: Diagnosis not present

## 2018-08-04 DIAGNOSIS — Z888 Allergy status to other drugs, medicaments and biological substances status: Secondary | ICD-10-CM

## 2018-08-04 DIAGNOSIS — Z9842 Cataract extraction status, left eye: Secondary | ICD-10-CM | POA: Diagnosis not present

## 2018-08-04 DIAGNOSIS — Z90710 Acquired absence of both cervix and uterus: Secondary | ICD-10-CM

## 2018-08-04 DIAGNOSIS — M961 Postlaminectomy syndrome, not elsewhere classified: Secondary | ICD-10-CM | POA: Diagnosis not present

## 2018-08-04 DIAGNOSIS — T8089XA Other complications following infusion, transfusion and therapeutic injection, initial encounter: Principal | ICD-10-CM | POA: Diagnosis present

## 2018-08-04 DIAGNOSIS — M461 Sacroiliitis, not elsewhere classified: Secondary | ICD-10-CM | POA: Diagnosis not present

## 2018-08-04 DIAGNOSIS — Z6834 Body mass index (BMI) 34.0-34.9, adult: Secondary | ICD-10-CM

## 2018-08-04 DIAGNOSIS — I5030 Unspecified diastolic (congestive) heart failure: Secondary | ICD-10-CM | POA: Diagnosis not present

## 2018-08-04 HISTORY — DX: Other chronic pain: G89.29

## 2018-08-04 HISTORY — DX: Other chronic pain: M54.9

## 2018-08-04 LAB — URINALYSIS, ROUTINE W REFLEX MICROSCOPIC
Bilirubin Urine: NEGATIVE
GLUCOSE, UA: NEGATIVE mg/dL
HGB URINE DIPSTICK: NEGATIVE
Ketones, ur: NEGATIVE mg/dL
Leukocytes, UA: NEGATIVE
Nitrite: NEGATIVE
Protein, ur: NEGATIVE mg/dL
SPECIFIC GRAVITY, URINE: 1.013 (ref 1.005–1.030)
pH: 7 (ref 5.0–8.0)

## 2018-08-04 LAB — COMPREHENSIVE METABOLIC PANEL
ALT: 15 U/L (ref 0–44)
AST: 18 U/L (ref 15–41)
Albumin: 4.1 g/dL (ref 3.5–5.0)
Alkaline Phosphatase: 80 U/L (ref 38–126)
Anion gap: 8 (ref 5–15)
BILIRUBIN TOTAL: 0.8 mg/dL (ref 0.3–1.2)
BUN: 18 mg/dL (ref 8–23)
CO2: 28 mmol/L (ref 22–32)
CREATININE: 0.9 mg/dL (ref 0.44–1.00)
Calcium: 8.9 mg/dL (ref 8.9–10.3)
Chloride: 103 mmol/L (ref 98–111)
GFR calc Af Amer: >60 mL/min (ref >60)
Glucose, Bld: 135 mg/dL — ABNORMAL HIGH (ref 70–99)
POTASSIUM: 4.1 mmol/L (ref 3.5–5.1)
Sodium: 139 mmol/L (ref 135–145)
TOTAL PROTEIN: 7.2 g/dL (ref 6.5–8.1)

## 2018-08-04 LAB — RAPID URINE DRUG SCREEN, HOSP PERFORMED
Amphetamines: NOT DETECTED
Barbiturates: NOT DETECTED
Benzodiazepines: NOT DETECTED
Cocaine: NOT DETECTED
OPIATES: NOT DETECTED
Tetrahydrocannabinol: NOT DETECTED

## 2018-08-04 LAB — CBC
HEMATOCRIT: 37.6 % (ref 36.0–46.0)
HEMOGLOBIN: 12 g/dL (ref 12.0–15.0)
MCH: 27.3 pg (ref 26.0–34.0)
MCHC: 31.9 g/dL (ref 30.0–36.0)
MCV: 85.6 fL (ref 78.0–100.0)
Platelets: 190 10*3/uL (ref 150–400)
RBC: 4.39 MIL/uL (ref 3.87–5.11)
RDW: 15.6 % — ABNORMAL HIGH (ref 11.5–15.5)
WBC: 5.8 10*3/uL (ref 4.0–10.5)

## 2018-08-04 LAB — DIFFERENTIAL
BASOS ABS: 0 10*3/uL (ref 0.0–0.1)
Basophils Relative: 1 %
EOS ABS: 0.1 10*3/uL (ref 0.0–0.7)
Eosinophils Relative: 2 %
LYMPHS ABS: 1.5 10*3/uL (ref 0.7–4.0)
Lymphocytes Relative: 27 %
MONOS PCT: 8 %
Monocytes Absolute: 0.5 10*3/uL (ref 0.1–1.0)
Neutro Abs: 3.6 10*3/uL (ref 1.7–7.7)
Neutrophils Relative %: 62 %

## 2018-08-04 LAB — VITAMIN B12: Vitamin B-12: 96 pg/mL — ABNORMAL LOW (ref 180–914)

## 2018-08-04 LAB — PROTIME-INR
INR: 0.94
Prothrombin Time: 12.5 seconds (ref 11.4–15.2)

## 2018-08-04 LAB — ETHANOL: Alcohol, Ethyl (B): <10 mg/dL (ref <10)

## 2018-08-04 LAB — TSH: TSH: 2.888 u[IU]/mL (ref 0.350–4.500)

## 2018-08-04 LAB — APTT: APTT: 28 s (ref 24–36)

## 2018-08-04 LAB — TROPONIN I: Troponin I: <0.03 ng/mL (ref <0.03)

## 2018-08-04 MED ORDER — IOPAMIDOL (ISOVUE-370) INJECTION 76%
75.0000 mL | Freq: Once | INTRAVENOUS | Status: AC | PRN
  Administered 2018-08-04: 75 mL via INTRAVENOUS

## 2018-08-04 MED ORDER — IOHEXOL 350 MG/ML SOLN: 75.0000 mL | Freq: Once | INTRAVENOUS | Status: DC | PRN

## 2018-08-04 MED ORDER — ACETAMINOPHEN 160 MG/5ML PO SOLN: 650.0000 mg | ORAL | Status: DC | PRN

## 2018-08-04 MED ORDER — SENNOSIDES-DOCUSATE SODIUM 8.6-50 MG PO TABS: 1.0000 | ORAL_TABLET | Freq: Every evening | ORAL | Status: DC | PRN

## 2018-08-04 MED ORDER — STROKE: EARLY STAGES OF RECOVERY BOOK
Freq: Once | Status: AC
  Administered 2018-08-04: 18:00:00
  Filled 2018-08-04: qty 1

## 2018-08-04 MED ORDER — OXYCODONE-ACETAMINOPHEN 5-325 MG PO TABS: 1.0000 | ORAL_TABLET | ORAL | Status: DC | PRN

## 2018-08-04 MED ORDER — ONDANSETRON HCL 4 MG/2ML IJ SOLN: 4.0000 mg | Freq: Four times a day (QID) | INTRAMUSCULAR | Status: DC | PRN

## 2018-08-04 MED ORDER — ASPIRIN 325 MG PO TABS
325.0000 mg | ORAL_TABLET | Freq: Every day | ORAL | Status: DC
  Administered 2018-08-04 – 2018-08-05 (×2): 325 mg via ORAL
  Filled 2018-08-04 (×2): qty 1

## 2018-08-04 MED ORDER — ENOXAPARIN SODIUM 40 MG/0.4ML ~~LOC~~ SOLN
40.0000 mg | SUBCUTANEOUS | Status: DC
  Administered 2018-08-04: 40 mg via SUBCUTANEOUS
  Filled 2018-08-04: qty 0.4

## 2018-08-04 MED ORDER — ASPIRIN 300 MG RE SUPP: 300.0000 mg | Freq: Every day | RECTAL | Status: DC

## 2018-08-04 MED ORDER — ACETAMINOPHEN 650 MG RE SUPP: 650.0000 mg | RECTAL | Status: DC | PRN

## 2018-08-04 MED ORDER — ACETAMINOPHEN 325 MG PO TABS: 650.0000 mg | ORAL_TABLET | ORAL | Status: DC | PRN

## 2018-08-04 NOTE — Progress Notes (Signed)
Pt 1905 blood pressure 198/97. MD notified, stated to continue to monitor as long as no new symptoms have occurred. Night shift RN notified.

## 2018-08-04 NOTE — Consult Note (Signed)
[place] A. Dawn Laughter, MD     [email or web address]          [name] is an 72 y.o. female.   ASSESSMENT/PLAN:  1.  REMOTE RIGHT FRONTAL INFARCT: I believe the MRI findings are remote and incidental.  They do not explain the patient's symptoms.  The patient will be worked up however.  I agree with the current work-up which included CT of the head and neck and also echocardiography.  The patient will be placed on aspirin 325.  Risk factor for her remote infarct hypertension and age.  2.  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  3.  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  Nerve conductions testing should be done as an outpatient.  4.  Right temporal headache: Given her age we should rule out for temporal arteritis.  Consequently, C-reactive protein and sed rate will be obtained.  5.  Chronic low back pain status post multiple back procedures.   Patient is 71 year old right-handed white female who was sent to the hospital because of concern of acute stroke with the right upper extremity right facial numbness.  The patient tells me that she has had multiple back procedures and is on pain management for this as she has significant visual symptoms.  She takes oxycodone but also gets episodic injections.  These appears to be episodic epidural injections.  She was off her oxycodone to get her most recent injection 2 weeks ago.  She tells me that she just did not feel right after the last epidural injection 2 weeks ago.  It made her back and leg pain worse.  A week after that procedure she woke up with a numbness of the little finger and adjacent finger on the right side.  The symptoms involve the medial aspect of the forearm to the elbow.  She also reports having right facial soreness and pain.  She reports the facial symptoms more of a discomfort and not numbness or tingling.  The numbness  and tingling is apparently limited to the right hand and right forearm.  All return to her pain doctor because of her symptoms she was referred to the emergency room for further evaluation.  She reports no prior history of stroke.  She denies chest pain, shortness of breath, dysarthria or dysphagia.  She does not report clear focal weakness or gait impairment other than possible impairment due to low back pain.  The review of systems otherwise negative.  GENERAL:  This is a ver present obese femaley in no acute distress.   HEENT: There is mild soreness of the temporal region on the right side.  Neck is supple.  No trauma appreciated.  ABDOMEN: soft  EXTREMITIES: No edema; there is a Tinel sign at the right elbow.  BACK: Unremarkable  SKIN: Normal by inspection.    MENTAL STATUS: Alert and oriented -including orientation to the month and her age. Speech, language and cognition are generally intact. Judgment and insight normal.   CRANIAL NERVES: Pupils are equal, round and reactive to light and accomodation; extra ocular movements are full, there is no significant nystagmus; visual fields are full; upper and lower facial muscles are normal in strength and symmetric, there is no flattening of the nasolabial folds; tongue is midline; uvula is midline; shoulder elevation is normal.  MOTOR: Normal tone, bulk and strength; no pronator drift.  No drift of the upper and lower extremities is  noted.  COORDINATION: Left finger to nose is normal, right finger to nose is normal, No rest tremor; no intention tremor; no postural tremor; no bradykinesia.  REFLEXES: Deep tendon reflexes are symmetrical and normal.   SENSATION: There is reduced sensation to light touch and temperature involving the little finger and half of the adjacent finger on the right side.  Additionally, the medial aspect of the forearm is involved.  This is all on the right side.  The other extremities are normal.  GAIT: Normal.     NIH stroke scale 1.  Blood pressure (!) 178/92, pulse 78, temperature 98.5 F (36.9 C), temperature source Oral, resp. rate 16, height '5\' 4"'  (1.626 m), weight 92 kg, SpO2 99 %.  Past Medical History:  Diagnosis Date  . Arthritis   . Hypertension    no meds in 2 yrs pt took self off  . PONV (postoperative nausea and vomiting)     Past Surgical History:  Procedure Laterality Date  . ABDOMINAL HYSTERECTOMY  82  . BACK SURGERY  2010  . BACK SURGERY  2015  . BACK SURGERY  06/2016  . CATARACT EXTRACTION W/PHACO Left 01/07/2017   Procedure: CATARACT EXTRACTION PHACO AND INTRAOCULAR LENS PLACEMENT (IOC);  Surgeon: Tonny Branch, MD;  Location: [facility];  Service: Ophthalmology;  Laterality: Left;  left CDE 9.31   . CATARACT EXTRACTION W/PHACO Right 01/25/2017   Procedure: CATARACT EXTRACTION PHACO AND INTRAOCULAR LENS PLACEMENT ([place]) CDE:16.07;  Surgeon: Tonny Branch, MD;  Location: [facility];  Service: Ophthalmology;  Laterality: Right;  right  . FRACTURE SURGERY Right    arm  1974  . LUMBAR WOUND DEBRIDEMENT N/A 09/23/2017   Procedure: Exploration of Lumbar Wound; Repair of Pseudomeningocele;  Surgeon: Newman Pies, MD;  Location: [place];  Service: Neurosurgery;  Laterality: N/A;    Family History  Problem Relation Age of Onset  . Hypertension Mother   . Breast cancer Neg Hx     Social History:  reports that she has never smoked. She has never used smokeless tobacco. She reports that she does not drink alcohol or use drugs.  Allergies:  Allergies  Allergen Reactions  . Flexeril [Cyclobenzaprine] Nausea Only  . Hydrocodone   . Lyrica [Pregabalin]   . Neurontin [Gabapentin] Palpitations    Medications: Prior to Admission medications   Medication Sig Start Date End Date Taking? Authorizing Provider  oxyCODONE-acetaminophen (PERCOCET/ROXICET) 5-325 MG tablet Take 1-2 tablets every 4 (four) hours as needed by mouth for moderate pain. 09/28/17  Yes Newman Pies, MD   cyclobenzaprine (FLEXERIL) 10 MG tablet Take 1 tablet (10 mg total) by mouth 3 (three) times daily as needed for muscle spasms. Patient not taking: Reported on 09/20/2017 09/14/17   Newman Pies, MD  docusate sodium (COLACE) 100 MG capsule Take 1 capsule (100 mg total) by mouth 2 (two) times daily. Patient not taking: Reported on 08/04/2018 09/14/17   Newman Pies, MD    Scheduled Meds: . aspirin  300 mg Rectal Daily   Or  . aspirin  325 mg Oral Daily  . enoxaparin (LOVENOX) injection  40 mg Subcutaneous Q24H   Continuous Infusions: PRN Meds:.acetaminophen **OR** acetaminophen (TYLENOL) oral liquid 160 mg/5 mL **OR** acetaminophen, iohexol, iopamidol, ondansetron (ZOFRAN) IV, oxyCODONE-acetaminophen, senna-docusate     Results for orders placed or performed during the hospital encounter of 08/04/18 (from the past 48 hour(s))  Urine rapid drug screen (hosp performed)     Status: None   Collection Time: 08/04/18 12:31 PM  Result  Value Ref Range   Opiates NONE DETECTED NONE DETECTED   Cocaine NONE DETECTED NONE DETECTED   Benzodiazepines NONE DETECTED NONE DETECTED   Amphetamines NONE DETECTED NONE DETECTED   Tetrahydrocannabinol NONE DETECTED NONE DETECTED   Barbiturates NONE DETECTED NONE DETECTED    Comment: (NOTE) DRUG SCREEN FOR MEDICAL PURPOSES ONLY.  IF CONFIRMATION IS NEEDED FOR ANY PURPOSE, NOTIFY LAB WITHIN 5 DAYS. LOWEST DETECTABLE LIMITS FOR URINE DRUG SCREEN Drug Class                     Cutoff (ng/mL) Amphetamine and metabolites    1000 Barbiturate and metabolites    200 Benzodiazepine                 245 Tricyclics and metabolites     300 Opiates and metabolites        300 Cocaine and metabolites        300 THC                            50 Performed at [facility], [address], [place], [place] [address]   Urinalysis, Routine w reflex microscopic     Status: None   Collection Time: 08/04/18 12:31 PM  Result Value Ref Range   Color, Urine YELLOW  YELLOW   APPearance CLEAR CLEAR   Specific Gravity, Urine 1.013 1.005 - 1.030   pH 7.0 5.0 - 8.0   Glucose, UA NEGATIVE NEGATIVE mg/dL   Hgb urine dipstick NEGATIVE NEGATIVE   Bilirubin Urine NEGATIVE NEGATIVE   Ketones, ur NEGATIVE NEGATIVE mg/dL   Protein, ur NEGATIVE NEGATIVE mg/dL   Nitrite NEGATIVE NEGATIVE   Leukocytes, UA NEGATIVE NEGATIVE    Comment: Performed at [facility], [address], [place], [place] [address]  Ethanol     Status: None   Collection Time: 08/04/18 12:38 PM  Result Value Ref Range   Alcohol, Ethyl (B) <10 <10 mg/dL    Comment: (NOTE) Lowest detectable limit for serum alcohol is 10 mg/dL. For medical purposes only. Performed at [facility], [address] [place], [place] [address]   Protime-INR     Status: None   Collection Time: 08/04/18 12:38 PM  Result Value Ref Range   Prothrombin Time 12.5 11.4 - 15.2 seconds   INR 0.94     Comment: Performed at [facility], [address] [place], [place] [address]  APTT     Status: None   Collection Time: 08/04/18 12:38 PM  Result Value Ref Range   aPTT 28 24 - 36 seconds    Comment: Performed at [facility], [address], [place], [place] [address]  CBC     Status: Abnormal   Collection Time: 08/04/18 12:38 PM  Result Value Ref Range   WBC 5.8 4.0 - 10.5 K/uL   RBC 4.39 3.87 - 5.11 MIL/uL   Hemoglobin 12.0 12.0 - 15.0 g/dL   HCT 37.6 36.0 - 46.0 %   MCV 85.6 78.0 - 100.0 fL   MCH 27.3 26.0 - 34.0 pg   MCHC 31.9 30.0 - 36.0 g/dL   RDW 15.6 (H) 11.5 - 15.5 %   Platelets 190 150 - 400 K/uL    Comment: Performed at [facility], [address], [place], [place] [address]  Differential     Status: None   Collection Time: 08/04/18 12:38 PM  Result Value Ref Range   Neutrophils Relative % 62 %  Neutro Abs 3.6 1.7 - 7.7 K/uL   Lymphocytes Relative 27 %   Lymphs Abs 1.5 0.7 - 4.0 K/uL   Monocytes Relative 8 %   Monocytes Absolute 0.5 0.1 - 1.0 K/uL   Eosinophils Relative 2 %   Eosinophils  Absolute 0.1 0.0 - 0.7 K/uL   Basophils Relative 1 %   Basophils Absolute 0.0 0.0 - 0.1 K/uL    Comment: Performed at [facility], [address], [place], [place] [address]  Comprehensive metabolic panel     Status: Abnormal   Collection Time: 08/04/18 12:38 PM  Result Value Ref Range   Sodium 139 135 - 145 mmol/L   Potassium 4.1 3.5 - 5.1 mmol/L   Chloride 103 98 - 111 mmol/L   CO2 28 22 - 32 mmol/L   Glucose, Bld 135 (H) 70 - 99 mg/dL   BUN 18 8 - 23 mg/dL   Creatinine, Ser 0.90 0.44 - 1.00 mg/dL   Calcium 8.9 8.9 - 10.3 mg/dL   Total Protein 7.2 6.5 - 8.1 g/dL   Albumin 4.1 3.5 - 5.0 g/dL   AST 18 15 - 41 U/L   ALT 15 0 - 44 U/L   Alkaline Phosphatase 80 38 - 126 U/L   Total Bilirubin 0.8 0.3 - 1.2 mg/dL   GFR calc non Af Amer >60 >60 mL/min   GFR calc Af Amer >60 >60 mL/min    Comment: (NOTE) The eGFR has been calculated using the CKD EPI equation. This calculation has not been validated in all clinical situations. eGFR's persistently <60 mL/min signify possible Chronic Kidney Disease.    Anion gap 8 5 - 15    Comment: Performed at [facility], [address] [place], [place] [address]  Troponin I     Status: None   Collection Time: 08/04/18 12:38 PM  Result Value Ref Range   Troponin I <0.03 <0.03 ng/mL    Comment: Performed at [facility], [address], [place], [place] [address]    Studies/Results:   BRAIN MRI FINDINGS: Brain: Diffusion imaging shows a punctate acute infarction in the centrum semiovale a radiating white matter tracts on the right. No evidence of other recent infarction. Elsewhere, there are minimal small vessel changes of the white matter. No cortical or large vessel territory infarction. No mass lesion, hemorrhage, hydrocephalus or extra-axial collection. Dilated perivascular spaces incidentally present.  Vascular: Major vessels at the base of the brain show flow.  Skull and upper cervical spine: Negative  Sinuses/Orbits:  Clear/normal  Other: None  IMPRESSION: Acute punctate infarction in the right centrum semiovale radiating white matter tracts. No other acute finding. It is not clear how this would relate to the right-sided symptoms described in the history.  Chronic small-vessel ischemic changes of a mild degree elsewhere affecting the cerebral hemispheric white matter.     The brain MRI is reviewed in person.  There is a small increased signal seen on DWI involving the centrum semiovale.  This slightly extends caudally.  However, this is associated with increased signal in the same location on the ADC scan.  This increased signal actually extends basal ganglia/insular cortex region on the right side.  This area is also associated with encephalomalacia on T1 and also increased signal on FLAIR imaging.  The findings are most consistent with a remote infarct with the T2 shine through on DWI.  No hemorrhage appreciated.  There is some mild periventricular leukoencephalopathy consistent with chronic microvascular changes.  On T1 there is  reduced signal involving the pontine regions bilaterally concerning for multiple lacunar infarcts that are remote.       [name] [name], M.D.  Diplomate, Tax adviser of Psychiatry and Neurology ( Neurology). 08/04/2018, 6:42 PM

## 2018-08-04 NOTE — ED Provider Notes (Signed)
[facility] EMERGENCY DEPARTMENT Provider Note   CSN: [id number] Arrival date & time: 08/04/18  1201     History   Chief Complaint Chief Complaint  Patient presents with  . Numbness    HPI [name] is a 72 y.o. female.  HPI   She presents for evaluation of constant numbness in her face, right arm and right thigh which has been present for 3 days.  She went to see her pain doctor this morning because she was concerned that the lidocaine shot which she had in the lumbar region, for low back pain, 2 weeks ago was causing this problem.  He told her that he was concerned that she had a TIA so suggested that she go see a doctor.  She decided to come here.  She also has noticed a right-sided headache, today.  She denies inability to use her arms or legs, she is walking normally.  She ate breakfast this morning.  She is right-handed and had no trouble using her right hand.  She has chronic difficulty sleeping and typically takes a very short naps and moves around between different resting spots every night.  She has chronic pain and takes oxycodone as needed, usually several each day.  This pain has been problematic since her most recent surgery about 10 months ago.  She denies fever, chills, nausea, vomiting, constipation, diarrhea, dysuria or urinary frequency.  She is taking her usual medications.  There are no other known modifying factors.  Past Medical History:  Diagnosis Date  . Arthritis   . Hypertension    no meds in 2 yrs pt took self off  . PONV (postoperative nausea and vomiting)     Patient Active Problem List   Diagnosis Date Noted  . Post-operative pain 09/20/2017  . 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 Lumbar stenosis with neurogenic claudication 02/04/2015  . Stiffness of joints, not elsewhere classified, multiple sites 10/27/2012  . Weakness of both legs 10/27/2012  . Difficulty in walking(719.7) 10/27/2012    Past Surgical History:  Procedure  Laterality Date  . ABDOMINAL HYSTERECTOMY  82  . BACK SURGERY  2010  . BACK SURGERY  2015  . BACK SURGERY  06/2016  . CATARACT EXTRACTION W/PHACO Left 01/07/2017   Procedure: CATARACT EXTRACTION PHACO AND INTRAOCULAR LENS PLACEMENT ([facility]);  Surgeon: Tonny Branch, MD;  Location: [facility];  Service: Ophthalmology;  Laterality: Left;  left CDE 9.31   . CATARACT EXTRACTION W/PHACO Right 01/25/2017   Procedure: CATARACT EXTRACTION PHACO AND INTRAOCULAR LENS PLACEMENT ([place]) CDE:16.07;  Surgeon: Tonny Branch, MD;  Location: [facility];  Service: Ophthalmology;  Laterality: Right;  right  . FRACTURE SURGERY Right    arm  1974  . LUMBAR WOUND DEBRIDEMENT N/A 09/23/2017   Procedure: Exploration of Lumbar Wound; Repair of Pseudomeningocele;  Surgeon: Newman Pies, MD;  Location: [place];  Service: Neurosurgery;  Laterality: N/A;     OB History   None      Home Medications    Prior to Admission medications   Medication Sig Start Date End Date Taking? Authorizing Provider  oxyCODONE-acetaminophen (PERCOCET/ROXICET) 5-325 MG tablet Take 1-2 tablets every 4 (four) hours as needed by mouth for moderate pain. 09/28/17  Yes Newman Pies, MD  cyclobenzaprine (FLEXERIL) 10 MG tablet Take 1 tablet (10 mg total) by mouth 3 (three) times daily as needed for muscle spasms. Patient not taking: Reported on 09/20/2017 09/14/17   Newman Pies, MD  docusate sodium (COLACE) 100 MG capsule Take 1  capsule (100 mg total) by mouth 2 (two) times daily. Patient not taking: Reported on 08/04/2018 09/14/17   Newman Pies, MD    Family History Family History  Problem Relation Age of Onset  . Hypertension Mother   . Breast cancer Neg Hx     Social History Social History   Tobacco Use  . Smoking status: Never Smoker  . Smokeless tobacco: Never Used  Substance Use Topics  . Alcohol use: No  . Drug use: No     Allergies   Flexeril [cyclobenzaprine]; Hydrocodone; Lyrica [pregabalin]; and Neurontin  [gabapentin]   Review of Systems Review of Systems  All other systems reviewed and are negative.    Physical Exam Updated Vital Signs BP (!) 162/78   Pulse 77   Temp 97.9 F (36.6 C) (Oral)   Resp 18   SpO2 99%   Physical Exam  Constitutional: She is oriented to person, place, and time. She appears well-developed. No distress.  Elderly, overweight  HENT:  Head: Normocephalic and atraumatic.  Eyes: Pupils are equal, round, and reactive to light. Conjunctivae and EOM are normal.  Neck: Normal range of motion and phonation normal. Neck supple.  Cardiovascular: Normal rate and regular rhythm.  Pulmonary/Chest: Effort normal and breath sounds normal. She exhibits no tenderness.  Abdominal: Soft. She exhibits no distension. There is no tenderness. There is no guarding.  Musculoskeletal: Normal range of motion.  Neurological: She is alert and oriented to person, place, and time. She exhibits normal muscle tone. Coordination normal.  No dysarthria, no nystagmus or aphasia.  No pronator drift.  No ataxia.  Very mild decreased light touch sensation of the right cheek but not the right forehead.  Very mild decreased light touch sensation of the right fifth finger but not the remainder of the fingers, the right forearm or the right upper arm.  No numbness of the upper or lower legs, or feet, bilaterally.  Normal strength arms and legs bilaterally.  Skin: Skin is warm and dry.  Psychiatric: She has a normal mood and affect. Her behavior is normal. Judgment and thought content normal.  Nursing note and vitals reviewed.    ED Treatments / Results  Labs (all labs ordered are listed, but only abnormal results are displayed) Labs Reviewed  CBC - Abnormal; Notable for the following components:      Result Value   RDW 15.6 (*)    All other components within normal limits  COMPREHENSIVE METABOLIC PANEL - Abnormal; Notable for the following components:   Glucose, Bld 135 (*)    All other  components within normal limits  ETHANOL  PROTIME-INR  APTT  DIFFERENTIAL  RAPID URINE DRUG SCREEN, HOSP PERFORMED  URINALYSIS, ROUTINE W REFLEX MICROSCOPIC  TROPONIN I    EKG EKG Interpretation  Date/Time:  Thursday August 04 2018 13:24:55 EDT Ventricular Rate:  72 PR Interval:    QRS Duration: 81 QT Interval:  419 QTC Calculation: 459 R Axis:   23 Text Interpretation:  Sinus rhythm Low voltage, precordial leads Abnormal R-wave progression, early transition Borderline T abnormalities, anterior leads since last tracing no significant change Confirmed by Daleen Bo [phone]) on 08/04/2018 1:47:12 PM   Radiology Mr Brain Wo Contrast  Result Date: 08/04/2018 CLINICAL DATA:  Numbness of the right side of the face in the right upper extremity, 4 days duration. EXAM: MRI HEAD WITHOUT CONTRAST TECHNIQUE: Multiplanar, multiecho pulse sequences of the brain and surrounding structures were obtained without intravenous contrast. COMPARISON:  None. FINDINGS: Brain:  Diffusion imaging shows a punctate acute infarction in the centrum semiovale a radiating white matter tracts on the right. No evidence of other recent infarction. Elsewhere, there are minimal small vessel changes of the white matter. No cortical or large vessel territory infarction. No mass lesion, hemorrhage, hydrocephalus or extra-axial collection. Dilated perivascular spaces incidentally present. Vascular: Major vessels at the base of the brain show flow. 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 No other acute finding. It is not clear how this would relate to the right-sided symptoms described in the history. Chronic small-vessel ischemic changes of a mild degree elsewhere affecting the cerebral hemispheric white matter. Electronically Signed   By: Nelson Chimes M.D.   On: 08/04/2018 13:20     Procedures .Critical Care Performed by: Daleen Bo, MD Authorized by: Daleen Bo, MD   Critical care provider statement:    Critical care time (minutes):  45   Critical care start time:  08/04/2018 12:10 PM   Critical care end time:  08/04/2018 3:34 PM   Critical care was necessary to treat or prevent imminent or life-threatening deterioration of the following conditions:  CNS failure or compromise   Critical care was time spent personally by me on the following activities:  Discussions with consultants, evaluation of patient's response to treatment, examination of patient, ordering and performing treatments and interventions, ordering and review of laboratory studies, ordering and review of radiographic studies, pulse oximetry, re-evaluation of patient's condition, obtaining history from patient or surrogate and review of old charts   (including critical care time)  Medications Ordered in ED Medications - No data to display   Initial Impression / Assessment and Plan / ED Course  I have reviewed the triage vital signs and the nursing notes.  Pertinent labs & imaging results that were available during my care of the patient were reviewed by me and considered in my medical decision making (see chart for details).  Clinical Course as of Aug 04 1532  Thu Aug 04, 2018  1236 Initial evaluation would be most consistent with CVA, very mild symptoms, very minor, subacute, possibly a lacunar infarct. Not [place] Stroke/LVO. Will evaluate with MR imaging.   [EW]  72 I had a communication with [facility], who spoke with the neuro hospitalist at [facility], who made the determination that this patient's stroke could be cared for at the [facility] facility.   [EW]    Clinical Course User Index [EW] Daleen Bo, MD     Patient Vitals for the past 24 hrs:  BP Temp Temp src Pulse Resp SpO2  08/04/18 1500 (!) 162/78 - - 77 18 99 %  08/04/18 1430 (!) 144/76 - - 66 14 100 %   08/04/18 1230 134/60 - - 86 - 100 %  08/04/18 1206 131/81 97.9 F (36.6 C) Oral 92 16 100 %    3:33 PM Reevaluation with update and discussion. After initial assessment and treatment, an updated evaluation reveals he continues to complain of mild headache.  No change in remainder of neurologic exam.  Findings discussed with the patient and her husband, all questions were answered. Daleen Bo   Medical Decision Making: Acute right brain stroke, with right-sided paresthesia.  Etiology paresthesia not clear, no significant deficits associated with acute right pain stroke.  Patient will require additional evaluation for embolic disease, carotids and cardiac ultrasound.  CRITICAL CARE-yes Performed by: Daleen Bo  Nursing Notes Reviewed/ Care Coordinated Applicable Imaging Reviewed Interpretation of Laboratory Data incorporated into ED treatment  3:15 PM-Consult complete with hospitalist. Patient case explained and discussed.  She agrees to admit patient for further evaluation and treatment. Call ended at 3:25 PM   Final Clinical Impressions(s) / ED Diagnoses   Final diagnoses:  Cerebrovascular accident (CVA), unspecified mechanism [facility])  Paresthesia    ED Discharge Orders    None       Daleen Bo, MD 08/04/18 1535

## 2018-08-04 NOTE — ED Triage Notes (Signed)
Pt reports numbness on right side of face, right arm and right leg feels heavy which began 3 days ago.

## 2018-08-04 NOTE — H&P (Signed)
History and Physical    [name] QBH:[id number] DOB: [birth year] DOA: 08/04/2018  PCP: Celene Squibb, MD   Patient coming from: Home  I have personally briefly reviewed patient's old medical records in [place]  Chief Complaint: headache   HPI: [name] is a 72 y.o. female with medical history significant of chronic back pain presents with right-sided headache and right-sided facial numbness and arm numbness.  The symptoms started about 3 to 4 days ago.  Patient received a lumbar injection for chronic back pain about 2 weeks ago.  She thought that maybe this is why she was having her symptoms.  She went to that doctor today who was concerned for T IA and sent her to the ED.  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  Patient is right-handed.  Has intermittent right facial numbness as well as numbness down her right arm.  Occasionally has some numbness in her right lower extremity as well.  Patient is status post 2 back surgeries last year.  She was off of her Percocet for a while however in the past 2 week it  is restarted.  She denies any personal or family history of stroke.  She denies any tobacco use.  She  has history of hypertension but has not been on medication stating it was controlled.  ED Course: MRI of the brain was done results above.  ED physician Dr. Eulis Foster spoke with neuro hospitalist at [facility] who determined patient to be cared for at this facility  Review of Systems: Denies chest pain shortness of breath fever cough dysuria nausea vomiting All others reviewed with patient  and are  negative unless otherwise stated   Past Medical History:  Diagnosis Date  . Arthritis   . Chronic back pain   . Hypertension    no meds in 2 yrs pt took self off  . PONV (postoperative nausea and vomiting)     Past Surgical History:  Procedure Laterality Date  .  ABDOMINAL HYSTERECTOMY  82  . BACK SURGERY  2010  . BACK SURGERY  2015  . BACK SURGERY  06/2016  . CATARACT EXTRACTION W/PHACO Left 01/07/2017   Procedure: CATARACT EXTRACTION PHACO AND INTRAOCULAR LENS PLACEMENT (IOC);  Surgeon: Tonny Branch, MD;  Location: [facility];  Service: Ophthalmology;  Laterality: Left;  left CDE 9.31   . CATARACT EXTRACTION W/PHACO Right 01/25/2017   Procedure: CATARACT EXTRACTION PHACO AND INTRAOCULAR LENS PLACEMENT ([place]) CDE:16.07;  Surgeon: Tonny Branch, MD;  Location: [facility];  Service: Ophthalmology;  Laterality: Right;  right  . FRACTURE SURGERY Right    arm  1974  . LUMBAR WOUND DEBRIDEMENT N/A 09/23/2017   Procedure: Exploration of Lumbar Wound; Repair of Pseudomeningocele;  Surgeon: Newman Pies, MD;  Location: [facility];  Service: Neurosurgery;  Laterality: N/A;     reports that she has never smoked. She has never used smokeless tobacco. She reports that she does not drink alcohol or use drugs.  Allergies  Allergen Reactions  . Flexeril [Cyclobenzaprine] Nausea Only  . Hydrocodone   . Lyrica [Pregabalin]   . Neurontin [Gabapentin] Palpitations    Family History  Problem Relation Age of Onset  . Hypertension Mother   . Breast cancer Neg Hx   . Stroke Neg Hx      Prior to Admission medications   Medication Sig Start Date End Date Taking? Authorizing  Provider  oxyCODONE-acetaminophen (PERCOCET/ROXICET) 5-325 MG tablet Take 1-2 tablets every 4 (four) hours as needed by mouth for moderate pain. 09/28/17  Yes Newman Pies, MD  cyclobenzaprine (FLEXERIL) 10 MG tablet Take 1 tablet (10 mg total) by mouth 3 (three) times daily as needed for muscle spasms. Patient not taking: Reported on 09/20/2017 09/14/17   Newman Pies, MD  docusate sodium (COLACE) 100 MG capsule Take 1 capsule (100 mg total) by mouth 2 (two) times daily. Patient not taking: Reported on 08/04/2018 09/14/17   Newman Pies, MD    Physical Exam: Vitals:   08/04/18 1230 08/04/18  1430 08/04/18 1500 08/04/18 1705  BP: 134/60 (!) 144/76 (!) 162/78 (!) 178/92  Pulse: 86 66 77 78  Resp:  14 18 16   Temp:    98.5 F (36.9 C)  TempSrc:    Oral  SpO2: 100% 100% 99% 99%  Weight:    92 kg  Height:    5\' 4"  (1.626 m)    Constitutional: NAD, calm, comfortable Vitals:   08/04/18 1230 08/04/18 1430 08/04/18 1500 08/04/18 1705  BP: 134/60 (!) 144/76 (!) 162/78 (!) 178/92  Pulse: 86 66 77 78  Resp:  14 18 16   Temp:    98.5 F (36.9 C)  TempSrc:    Oral  SpO2: 100% 100% 99% 99%  Weight:    92 kg  Height:    5\' 4"  (1.626 m)   Eyes: PERRL, lids and conjunctivae normal ENMT: Mucous membranes are moist. Posterior pharynx clear of any exudate or lesions.Normal dentition.  Neck: normal, supple, no masses,  Respiratory: , no wheezing, no crackles. Normal respiratory effort. No accessory muscle use.  Cardiovascular: Regular rate and rhythm, . No extremity edema. 2+ pedal pulses.   Abdomen: no tenderness, no masses palpated. No hepatosplenomegaly. Bowel sounds positive.  Musculoskeletal: no clubbing / cyanosis. No joint deformity upper and lower extremities. Good ROM, no contractures. Normal muscle tone.  Skin: no rashes, lesions, ulcers. No induration Neurologic: CN 2-12 grossly intact. Sensation intact,. Strength 5/5 in all 4.  Psychiatric: Normal judgment and insight. Alert and oriented x 3. Normal mood.     Labs on Admission: I have personally reviewed following labs and imaging studies  CBC: Recent Labs  Lab 08/04/18 1238  WBC 5.8  NEUTROABS 3.6  HGB 12.0  HCT 37.6  MCV 85.6  PLT 397   Basic Metabolic Panel: Recent Labs  Lab 08/04/18 1238  NA 139  K 4.1  CL 103  CO2 28  GLUCOSE 135*  BUN 18  CREATININE 0.90  CALCIUM 8.9   GFR: Estimated Creatinine Clearance: 62.1 mL/min (by C-G formula based on SCr of 0.9 mg/dL). Liver Function Tests: Recent Labs  Lab 08/04/18 1238  AST 18  ALT 15  ALKPHOS 80  BILITOT 0.8  PROT 7.2  ALBUMIN 4.1   No  results for input(s): LIPASE, AMYLASE in the last 168 hours. No results for input(s): AMMONIA in the last 168 hours. Coagulation Profile: Recent Labs  Lab 08/04/18 1238  INR 0.94   Cardiac Enzymes: Recent Labs  Lab 08/04/18 1238  TROPONINI <0.03   BNP (last 3 results) No results for input(s): PROBNP in the last 8760 hours. HbA1C: No results for input(s): HGBA1C in the last 72 hours. CBG: No results for input(s): GLUCAP in the last 168 hours. Lipid Profile: No results for input(s): CHOL, HDL, LDLCALC, TRIG, CHOLHDL, LDLDIRECT in the last 72 hours. Thyroid Function Tests: No results for input(s): TSH, T4TOTAL, FREET4, T3FREE,  THYROIDAB in the last 72 hours. Anemia Panel: No results for input(s): VITAMINB12, FOLATE, FERRITIN, TIBC, IRON, RETICCTPCT in the last 72 hours. Urine analysis:    Component Value Date/Time   COLORURINE YELLOW 08/04/2018 1231   APPEARANCEUR CLEAR 08/04/2018 1231   LABSPEC 1.013 08/04/2018 1231   PHURINE 7.0 08/04/2018 1231   GLUCOSEU NEGATIVE 08/04/2018 1231   HGBUR NEGATIVE 08/04/2018 1231   BILIRUBINUR NEGATIVE 08/04/2018 1231   KETONESUR NEGATIVE 08/04/2018 1231   PROTEINUR NEGATIVE 08/04/2018 1231   UROBILINOGEN 0.2 11/14/2008 1345   NITRITE NEGATIVE 08/04/2018 1231   LEUKOCYTESUR NEGATIVE 08/04/2018 1231    Radiological Exams on Admission: Mr Brain Wo Contrast  Result Date: 08/04/2018 CLINICAL DATA:  Numbness of the right side of the face in the right upper extremity, 4 days duration. EXAM: MRI HEAD WITHOUT CONTRAST TECHNIQUE: Multiplanar, multiecho pulse sequences of the brain and surrounding structures were obtained without intravenous contrast. COMPARISON:  None. FINDINGS: Brain: Diffusion imaging shows a punctate acute infarction in the centrum semiovale a radiating white matter tracts on the right. No evidence of other recent infarction. Elsewhere, there are minimal small vessel changes of the white matter. No cortical or large vessel  territory infarction. No mass lesion, hemorrhage, hydrocephalus or extra-axial collection. Dilated perivascular spaces incidentally present. Vascular: Major vessels at the base of the brain show flow. 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 No other acute finding. It is not clear how this would relate to the right-sided symptoms described in the history. Chronic small-vessel ischemic changes of a mild degree elsewhere affecting the cerebral hemispheric white matter. Electronically Signed   By: Nelson Chimes M.D.   On: 08/04/2018 13:20    EKG: Independently reviewed.  Sinus rhythm rate of 72 bpm nonspecific T wave changes  Assessment/Plan Principal Problem:   CVA (cerebral vascular accident) ([place]) Active Problems:   Chronic back pain   -Antiplatelet therapy with aspirin ,  CTA brain and neck arteries, 2D echo.  Monitor on telemetry, fasting lipid panel, consult rehab therapies, dysphagia screen passed.  Suspect her pain in her upper extremities likely radicular.  We will follow-up results of CTA brain and neck.  Suspect she will need to follow-up with her back surgeon. -Continue home pain medication regimen   DVT prophylaxis: Lovenox Code Status: Full Family Communication: Gust plan with husband at bedside  Disposition Plan: Discharge home 1 day  Admission status: Observation telemetry   Lennart Gladish Johnson-Pitts MD [facility] Pager [phone]  If 7PM-7AM, please contact night-coverage [email or web address] Password [email or web address]  08/04/2018, 7:01 PM

## 2018-08-05 ENCOUNTER — Inpatient Hospital Stay (HOSPITAL_COMMUNITY): Payer: Medicare Other

## 2018-08-05 DIAGNOSIS — Z90710 Acquired absence of both cervix and uterus: Secondary | ICD-10-CM | POA: Diagnosis not present

## 2018-08-05 DIAGNOSIS — G5621 Lesion of ulnar nerve, right upper limb: Secondary | ICD-10-CM | POA: Diagnosis present

## 2018-08-05 DIAGNOSIS — Z885 Allergy status to narcotic agent status: Secondary | ICD-10-CM | POA: Diagnosis not present

## 2018-08-05 DIAGNOSIS — Z9841 Cataract extraction status, right eye: Secondary | ICD-10-CM | POA: Diagnosis not present

## 2018-08-05 DIAGNOSIS — G9389 Other specified disorders of brain: Secondary | ICD-10-CM | POA: Diagnosis present

## 2018-08-05 DIAGNOSIS — I63019 Cerebral infarction due to thrombosis of unspecified vertebral artery: Secondary | ICD-10-CM

## 2018-08-05 DIAGNOSIS — R202 Paresthesia of skin: Secondary | ICD-10-CM | POA: Diagnosis present

## 2018-08-05 DIAGNOSIS — E669 Obesity, unspecified: Secondary | ICD-10-CM | POA: Diagnosis present

## 2018-08-05 DIAGNOSIS — Z888 Allergy status to other drugs, medicaments and biological substances status: Secondary | ICD-10-CM | POA: Diagnosis not present

## 2018-08-05 DIAGNOSIS — I6381 Other cerebral infarction due to occlusion or stenosis of small artery: Secondary | ICD-10-CM | POA: Diagnosis present

## 2018-08-05 DIAGNOSIS — M79606 Pain in leg, unspecified: Secondary | ICD-10-CM | POA: Diagnosis present

## 2018-08-05 DIAGNOSIS — Z8249 Family history of ischemic heart disease and other diseases of the circulatory system: Secondary | ICD-10-CM | POA: Diagnosis not present

## 2018-08-05 DIAGNOSIS — I5030 Unspecified diastolic (congestive) heart failure: Secondary | ICD-10-CM

## 2018-08-05 DIAGNOSIS — M545 Low back pain: Secondary | ICD-10-CM | POA: Diagnosis present

## 2018-08-05 DIAGNOSIS — G8929 Other chronic pain: Secondary | ICD-10-CM | POA: Diagnosis present

## 2018-08-05 DIAGNOSIS — I10 Essential (primary) hypertension: Secondary | ICD-10-CM | POA: Diagnosis present

## 2018-08-05 DIAGNOSIS — T8089XA Other complications following infusion, transfusion and therapeutic injection, initial encounter: Secondary | ICD-10-CM | POA: Diagnosis present

## 2018-08-05 DIAGNOSIS — Z6834 Body mass index (BMI) 34.0-34.9, adult: Secondary | ICD-10-CM | POA: Diagnosis not present

## 2018-08-05 DIAGNOSIS — Z961 Presence of intraocular lens: Secondary | ICD-10-CM | POA: Diagnosis present

## 2018-08-05 DIAGNOSIS — Z79899 Other long term (current) drug therapy: Secondary | ICD-10-CM | POA: Diagnosis not present

## 2018-08-05 DIAGNOSIS — Z9842 Cataract extraction status, left eye: Secondary | ICD-10-CM | POA: Diagnosis not present

## 2018-08-05 DIAGNOSIS — G9349 Other encephalopathy: Secondary | ICD-10-CM | POA: Diagnosis present

## 2018-08-05 LAB — ECHOCARDIOGRAM COMPLETE
HEIGHTINCHES: 64 in
WEIGHTICAEL: 3244.8 [oz_av]

## 2018-08-05 LAB — LIPID PANEL
CHOL/HDL RATIO: 4.8 ratio
Cholesterol: 258 mg/dL — ABNORMAL HIGH (ref 0–200)
HDL: 54 mg/dL (ref >40)
LDL CALC: 182 mg/dL — AB (ref 0–99)
Triglycerides: 109 mg/dL (ref <150)
VLDL: 22 mg/dL (ref 0–40)

## 2018-08-05 LAB — HEMOGLOBIN A1C
Hgb A1c MFr Bld: 6.1 % — ABNORMAL HIGH (ref 4.8–5.6)
Mean Plasma Glucose: 128.37 mg/dL

## 2018-08-05 LAB — C-REACTIVE PROTEIN: CRP: <0.8 mg/dL (ref <1.0)

## 2018-08-05 LAB — SEDIMENTATION RATE: SED RATE: 38 mm/h — AB (ref 0–22)

## 2018-08-05 MED ORDER — ATORVASTATIN CALCIUM 40 MG PO TABS: 40.0000 mg | ORAL_TABLET | Freq: Every day | ORAL | 11 refills | Status: DC

## 2018-08-05 MED ORDER — ASPIRIN EC 81 MG PO TBEC: 81.0000 mg | DELAYED_RELEASE_TABLET | Freq: Every day | ORAL | Status: DC

## 2018-08-05 MED ORDER — CYANOCOBALAMIN 1000 MCG/ML IJ SOLN
1000.0000 ug | Freq: Every day | INTRAMUSCULAR | Status: DC
  Administered 2018-08-05: 1000 ug via INTRAMUSCULAR
  Filled 2018-08-05: qty 1

## 2018-08-05 NOTE — Care Management Important Message (Signed)
Important Message  Patient Details  Name: ILZE ROSELLI MRN: [id number] Date of Birth: 1946-10-03   Medicare Important Message Given:  Yes    Shelda Altes 08/05/2018, 10:31 AM

## 2018-08-05 NOTE — Evaluation (Signed)
Occupational Therapy Evaluation Patient Details Name: [name] MRN: [id number] DOB: 08/28/1946 Today's Date: 08/05/2018    History of Present Illness [name] is a 72 y.o. female with medical history significant of chronic back pain presents with right-sided headache and right-sided facial numbness and arm numbness.  The symptoms started about 3 to 4 days ago.  Patient received a lumbar injection for chronic back pain about 2 weeks ago.  She thought that maybe this is why she was having her symptoms.  She went to that doctor today who was concerned for T IA and sent her to the ED.  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  Patient is right-handed.  Has intermittent right facial numbness as well as numbness down her right arm.  Occasionally has some numbness in her right lower extremity as well.  Patient is status post 2 back surgeries last year.  She was off of her Percocet for a while however in the past 2 week it  is restarted.  She denies any personal or family history of stroke.  She denies any tobacco use.  She  has history of hypertension but has not been on medication stating it was controlled.   Clinical Impression   Patient sitting up in chair upon therapy arrival and agreeable to participate in OT and PT evaluation. Pt states she is only experiencing slight numbness on her right hand and right cheek/eye region. She is not experiencing any weakness or visual changes. Her back continues to hurt which causes her to move and complete tasks at a more slow and cautious pace. Overall, she is functioning at baseline which is Mod I level. Patient does not require any additional follow up OT services and will sign off.     Follow Up Recommendations  No OT follow up    Equipment Recommendations  None recommended by OT       Precautions / Restrictions Precautions Precautions:  None Restrictions Weight Bearing Restrictions: No      Mobility Bed Mobility Overal bed mobility: Modified Independent        Transfers Overall transfer level: Modified independent            ADL either performed or assessed with clinical judgement   ADL Overall ADL's : Modified independent;At baseline           Vision Baseline Vision/History: No visual deficits Patient Visual Report: No change from baseline              Pertinent Vitals/Pain Pain Assessment: 0-10 Pain Score: 5  Pain Location: back (1/10 numbness on right cheek/eye and right hand) Pain Descriptors / Indicators: Aching Pain Intervention(s): Monitored during session     Hand Dominance Right   Extremity/Trunk Assessment Upper Extremity Assessment Upper Extremity Assessment: Overall WFL for tasks assessed   Lower Extremity Assessment Lower Extremity Assessment: Defer to PT evaluation       Communication Communication Communication: No difficulties   Cognition Arousal/Alertness: Awake/alert Behavior During Therapy: WFL for tasks assessed/performed Overall Cognitive Status: Within Functional Limits for tasks assessed                  Home Living Family/patient expects to be discharged to:: Private residence Living Arrangements: Spouse/significant other Available Help at Discharge: Family;Available 24 hours/day Type of Home: House Home Access: Stairs to enter [employer] of Steps: 3 Entrance Stairs-Rails: Can reach both Home  Layout: Multi-level;Able to live on main level with bedroom/bathroom Alternate Level Stairs-Number of Steps: 17-18 Alternate Level Stairs-Rails: Right           Home Equipment: Cane - single point;Walker - 4 wheels;Other (comment);Wheelchair - manual;Shower seat - built in(walking stick)          Prior Functioning/Environment Level of Independence: Independent with assistive device(s)        Comments: Using walking stick  occassionally when she is walking in the community and when walking for a long amount of time. 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         OT Problem List: Decreased strength         OT Goals(Current goals can be found in the care plan section) Acute Rehab OT Goals Patient Stated Goal: To go home  OT Frequency:     Barriers to D/C: (None)          Co-evaluation PT/OT/SLP Co-Evaluation/Treatment: Yes Reason for Co-Treatment: To address functional/ADL transfers   OT goals addressed during session: ADL's and self-care;Proper use of Adaptive equipment and DME;Strengthening/ROM      AM-PAC PT "6 Clicks" Daily Activity     Outcome Measure Help from another person eating meals?: None Help from another person taking care of personal grooming?: None Help from another person toileting, which includes using toliet, bedpan, or urinal?: None Help from another person bathing (including washing, rinsing, drying)?: None Help from another person to put on and taking off regular upper body clothing?: None Help from another person to put on and taking off regular lower body clothing?: None 6 Click Score: 24   End of Session Equipment Utilized During Treatment: Other (comment)(Walking stick)  Activity Tolerance: Patient tolerated treatment well Patient left: in chair;with call bell/phone within reach  OT Visit Diagnosis: Muscle weakness (generalized) (M62.81)                Time: 7672-0947 OT Time Calculation (min): 16 min Charges:  OT General Charges $OT Visit: 1 Visit OT Evaluation $OT Eval Low Complexity: 1 Low  Ailene Ravel, OTR/L,CBIS  [phone]  Joanthony Hamza, Clarene Duke 08/05/2018, 9:10 AM

## 2018-08-05 NOTE — Progress Notes (Signed)
Patient discharged home today per MD orders. Patient vital signs WDL. IV removed and site WDL. Discharge Instructions including follow up appointments, medications, and education reviewed with patient. Patient verbalizes understanding. Patient walked out with nursing staff.

## 2018-08-05 NOTE — Evaluation (Signed)
Physical Therapy Evaluation Patient Details Name: [name] MRN: [id number] DOB: May 01, 1946 Today's Date: 08/05/2018   History of Present Illness  [name] is a 72 y.o. female with medical history significant of chronic back pain presents with right-sided headache and right-sided facial numbness and arm numbness.  The symptoms started about 3 to 4 days ago.  Patient received a lumbar injection for chronic back pain about 2 weeks ago.  She thought that maybe this is why she was having her symptoms.  She went to that doctor today who was concerned for T IA and sent her to the ED.  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  Patient is right-handed.  Has intermittent right facial numbness as well as numbness down her right arm.  Occasionally has some numbness in her right lower extremity as well.  Patient is status post 2 back surgeries last year.  She was off of her Percocet for a while however in the past 2 week it  is restarted.  She denies any personal or family history of stroke.  She denies any tobacco use.  She  has history of hypertension but has not been on medication stating it was controlled.    Clinical Impression  Patient functioning at baseline for functional mobility and gait (see below).  Plan:  Patient discharged from physical therapy to care of nursing for ambulation daily as tolerated for length of stay.    Follow Up Recommendations No PT follow up    Equipment Recommendations  None recommended by PT    Recommendations for Other Services       Precautions / Restrictions Precautions Precautions: None Restrictions Weight Bearing Restrictions: No      Mobility  Bed Mobility Overal bed mobility: Modified Independent                Transfers Overall transfer level: Modified independent                  Ambulation/Gait Ambulation/Gait assistance:  Modified independent (Device/Increase time) Gait Distance (Feet): 150 Feet Assistive device: (walking stick) Gait Pattern/deviations: WFL(Within Functional Limits) Gait velocity: decreased   General Gait Details: demonstrates good return for ambulation on level, inclined and declined surfaces without loss of balance, required use of 1 side rail when going down ramp  Stairs Stairs: Yes Stairs assistance: Modified independent (Device/Increase time) Stair Management: One rail Left;Backwards;Step to pattern(walking stick) Number of Stairs: 5 General stair comments: demonstrates good return for going up steps using 1 siderail and walking stick, and stepping down steps backwards using 1 siderail and walking stick without loss of balance  Wheelchair Mobility    Modified Rankin (Stroke Patients Only)       Balance Overall balance assessment: Mild deficits observed, not formally tested                                           Pertinent Vitals/Pain Pain Assessment: 0-10 Pain Score: 5  Pain Location: 5/10 low back,  (1/10 numbness on right cheek/eye and right hand) Pain Descriptors / Indicators: Aching Pain Intervention(s): Monitored during session    Home Living Family/patient expects to be discharged to:: Private residence Living Arrangements: Spouse/significant other Available Help at Discharge: Family;Available 24 hours/day Type of Home: House Home Access: Stairs to enter  Entrance Stairs-Rails: Can reach both Entrance Stairs-Number of Steps: 3 Home Layout: Multi-level;Able to live on main level with bedroom/bathroom Home Equipment: Kasandra Knudsen - single point;Walker - 4 wheels;Other (comment);Wheelchair - manual;Shower seat - built in Additional Comments: uses walking stick for ambulation    Prior Function Level of Independence: Independent with assistive device(s)         Comments: Using walking stick occassionally when she is walking in the community and when  walking for a long amount of time. 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      Hand Dominance   Dominant Hand: Right    Extremity/Trunk Assessment   Upper Extremity Assessment Upper Extremity Assessment: Defer to OT evaluation    Lower Extremity Assessment Lower Extremity Assessment: Overall WFL for tasks assessed(mild weakness in bilateral hip flexors, but baseline per patient secondary to chronic back pain)    Cervical / Trunk Assessment Cervical / Trunk Assessment: Normal  Communication   Communication: No difficulties  Cognition Arousal/Alertness: Awake/alert Behavior During Therapy: WFL for tasks assessed/performed Overall Cognitive Status: Within Functional Limits for tasks assessed                                        General Comments      Exercises     Assessment/Plan    PT Assessment Patent does not need any further PT services  PT Problem List         PT Treatment Interventions      PT Goals (Current goals can be found in the Care Plan section)  Acute Rehab PT Goals Patient Stated Goal: To go home PT Goal Formulation: With patient Time For Goal Achievement: 08/05/18 Potential to Achieve Goals: Good    Frequency     Barriers to discharge        Co-evaluation   Reason for Co-Treatment: To address functional/ADL transfers   OT goals addressed during session: ADL's and self-care;Proper use of Adaptive equipment and DME;Strengthening/ROM       AM-PAC PT "6 Clicks" Daily Activity  Outcome Measure Difficulty turning over in bed (including adjusting bedclothes, sheets and blankets)?: None Difficulty moving from lying on back to sitting on the side of the bed? : None Difficulty sitting down on and standing up from a chair with arms (e.g., wheelchair, bedside commode, etc,.)?: None Help needed moving to and from a bed to chair (including a wheelchair)?: None Help needed walking in  hospital room?: None Help needed climbing 3-5 steps with a railing? : None 6 Click Score: 24    End of Session   Activity Tolerance: Patient tolerated treatment well Patient left: in chair;with call bell/phone within reach Nurse Communication: Mobility status PT Visit Diagnosis: Unsteadiness on feet (R26.81);Other abnormalities of gait and mobility (R26.89);Muscle weakness (generalized) (M62.81)    Time: 5456-2563 PT Time Calculation (min) (ACUTE ONLY): 21 min   Charges:   PT Evaluation $PT Eval Moderate Complexity: 1 Mod PT Treatments $Gait Training: 8-22 mins        10:37 AM, 08/05/18 Lonell Grandchild, MPT Physical Therapist with [facility] [phone] office [phone] mobile phone

## 2018-08-05 NOTE — Progress Notes (Signed)
*  PRELIMINARY RESULTS* Echocardiogram 2D Echocardiogram has been performed.  Leavy Cella 08/05/2018, 11:32 AM

## 2018-08-05 NOTE — Evaluation (Signed)
Speech Language Pathology Evaluation Patient Details Name: [name] MRN: [id number] DOB: Feb 26, 1946 Today's Date: 08/05/2018 Time: 4098-1191 SLP Time Calculation (min) (ACUTE ONLY): 12 min  Problem List:  Patient Active Problem List   Diagnosis Date Noted  . CVA (cerebral vascular accident) ([place]) 08/04/2018  . Stroke ([place]) 08/04/2018  . Chronic back pain 08/04/2018  . Post-operative pain 09/20/2017  . 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 Lumbar stenosis with neurogenic claudication 02/04/2015  . Stiffness of joints, not elsewhere classified, multiple sites 10/27/2012  . Weakness of both legs 10/27/2012  . Difficulty in walking(719.7) 10/27/2012   Past Medical History:  Past Medical History:  Diagnosis Date  . Arthritis   . Chronic back pain   . Hypertension    no meds in 2 yrs pt took self off  . PONV (postoperative nausea and vomiting)    Past Surgical History:  Past Surgical History:  Procedure Laterality Date  . ABDOMINAL HYSTERECTOMY  82  . BACK SURGERY  2010  . BACK SURGERY  2015  . BACK SURGERY  06/2016  . CATARACT EXTRACTION W/PHACO Left 01/07/2017   Procedure: CATARACT EXTRACTION PHACO AND INTRAOCULAR LENS PLACEMENT (IOC);  Surgeon: Tonny Branch, MD;  Location: [facility];  Service: Ophthalmology;  Laterality: Left;  left CDE 9.31   . CATARACT EXTRACTION W/PHACO Right 01/25/2017   Procedure: CATARACT EXTRACTION PHACO AND INTRAOCULAR LENS PLACEMENT ([place]) CDE:16.07;  Surgeon: Tonny Branch, MD;  Location: [facility];  Service: Ophthalmology;  Laterality: Right;  right  . FRACTURE SURGERY Right    arm  1974  . LUMBAR WOUND DEBRIDEMENT N/A 09/23/2017   Procedure: Exploration of Lumbar Wound; Repair of Pseudomeningocele;  Surgeon: Newman Pies, MD;  Location: [facility];  Service: Neurosurgery;  Laterality: N/A;   HPI:  [name] is a 72 y.o. female with medical history significant of chronic back pain presents with right-sided headache and right-sided facial  numbness and arm numbness.  The symptoms started about 3 to 4 days ago.  Patient received a lumbar injection for chronic back pain about 2 weeks ago.  She thought that maybe this is why she was having her symptoms.  She went to that doctor today who was concerned for T IA and sent her to the ED.  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  Patient is right-handed.  Has intermittent right facial numbness as well as numbness down her right arm.  Occasionally has some numbness in her right lower extremity as well.  Patient is status post 2 back surgeries last year.  She was off of her Percocet for a while however in the past 2 week it  is restarted.  She denies any personal or family history of stroke.  She denies any tobacco use.  She  has history of hypertension but has not been on medication stating it was controlled.    Assessment / Plan / Recommendation Clinical Impression  Pt seen this day for speech-language evaluation.  Pt alert and cooperative during evaluation.  Pt received a score of 27/30 on the MOCA and overall, she is functioning at cognitive baseline.  Pt denies any change in memory, thinking and level of attention. Patient does not require any additional follow up ST services and will sign off.    SLP Assessment  SLP Recommendation/Assessment: Patient does not need any further Speech Lanaguage Pathology Services SLP Visit Diagnosis: Other (comment)(SLE WFL)  Follow Up Recommendations  None    Frequency and Duration           SLP Evaluation Cognition  Overall Cognitive Status: Within Functional Limits for tasks assessed Arousal/Alertness: Awake/alert Orientation Level: Oriented X4 Comments: MOCA score of 27/30       Comprehension  Auditory Comprehension Overall Auditory Comprehension: Appears within functional limits for tasks assessed Visual  Recognition/Discrimination Discrimination: Within Function Limits Reading Comprehension Reading Status: Not tested    Expression Expression Primary Mode of Expression: Verbal Verbal Expression Overall Verbal Expression: Appears within functional limits for tasks assessed Written Expression Dominant Hand: Right   Oral / Motor  Oral Motor/Sensory Function Overall Oral Motor/Sensory Function: Within functional limits Motor Speech Overall Motor Speech: Appears within functional limits for tasks assessed   GO                   [name]  M.A., CCC-SLP [name] [name] 08/05/2018, 3:45 PM

## 2018-08-05 NOTE — Discharge Summary (Signed)
Physician Discharge Summary  [name] QQV:[id number] DOB: [birth year] DOA: 08/04/2018  PCP: Celene Squibb, MD  Admit date: 08/04/2018 Discharge date: 08/05/2018  Time spent: 45 minutes  Recommendations for Outpatient Follow-up:  -To be discharged home today. -Advise follow-up with PCP in 2 weeks.  Discharge Diagnoses:  Principal Problem:   CVA (cerebral vascular accident) [facility]) Active Problems:   Chronic back pain   Discharge Condition: Stable and improved  Filed Weights   08/04/18 1705  Weight: 92 kg    History of present illness:  As per Dr. Wynetta Emery on 9/19:  [name] is a 72 y.o. female with medical history significant of chronic back pain presents with right-sided headache and right-sided facial numbness and arm numbness.  The symptoms started about 3 to 4 days ago.  Patient received a lumbar injection for chronic back pain about 2 weeks ago.  She thought that maybe this is why she was having her symptoms.  She went to that doctor today who was concerned for T IA and sent her to the ED.  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  Patient is right-handed.  Has intermittent right facial numbness as well as numbness down her right arm.  Occasionally has some numbness in her right lower extremity as well.  Patient is status post 2 back surgeries last year.  She was off of her Percocet for a while however in the past 2 week it  is restarted.  She denies any personal or family history of stroke.  She denies any tobacco use.  She  has history of hypertension but has not been on medication stating it was controlled.  ED Course: MRI of the brain was done results above.  ED physician Dr. Eulis Foster spoke with neuro hospitalist at [facility] who determined patient to be cared for at this facility  Hospital Course:   Acute CVA -MRI with an acute punctate infarction in the right  centrum semiovale. -I believe this is an incidental finding as it does not explain her history of bilateral lower extremity pain, cramping and weakness.  More likely this is related to recent epidural injection for chronic back pain. -Has been started on aspirin for secondary stroke prevention. -Lipid panel with an LDL of 182, Lipitor 40 mg daily has been started.  Should have LFTs and CPK levels monitored within the next 6 to 8 weeks. -Has been evaluated by speech and physical therapy, she requires no outpatient follow-up. -CTA of the head and neck without any major vascular occlusions. -2D echo: Ejection fraction of 55 to 33%, grade 1 diastolic dysfunction, no atrial septal defect or PFO was identified.  Acute on chronic lower back pain and extremity pain -This is not explained by her acute CVA. -Will need outpatient follow-up with her back surgeon.  Procedures:  As above  Consultations:  None  Discharge Instructions  Discharge Instructions    Diet - low sodium heart healthy   Complete by:  As directed    Increase activity slowly   Complete by:  As directed      Allergies as of 08/05/2018      Reactions   Flexeril [cyclobenzaprine] Nausea Only   Hydrocodone    Lyrica [pregabalin]    Neurontin [gabapentin] Palpitations      Medication List    TAKE these medications   aspirin EC 81 MG tablet Take 1  tablet (81 mg total) by mouth daily.   atorvastatin 40 MG tablet Commonly known as:  LIPITOR Take 1 tablet (40 mg total) by mouth daily.   cyclobenzaprine 10 MG tablet Commonly known as:  FLEXERIL Take 1 tablet (10 mg total) by mouth 3 (three) times daily as needed for muscle spasms.   docusate sodium 100 MG capsule Commonly known as:  COLACE Take 1 capsule (100 mg total) by mouth 2 (two) times daily.   oxyCODONE-acetaminophen 5-325 MG tablet Commonly known as:  PERCOCET/ROXICET Take 1-2 tablets every 4 (four) hours as needed by mouth for moderate pain.       Allergies  Allergen Reactions  . Flexeril [Cyclobenzaprine] Nausea Only  . Hydrocodone   . Lyrica [Pregabalin]   . Neurontin [Gabapentin] Palpitations   Follow-up Information    Celene Squibb, MD. Schedule an appointment as soon as possible for a visit in 2 week(s).   Specialty:  Internal Medicine Contact information: [place] [address] [phone]            The results of significant diagnostics from this hospitalization (including imaging, microbiology, ancillary and laboratory) are listed below for reference.    Significant Diagnostic Studies: Ct Angio Head W Or Wo Contrast  Result Date: 08/04/2018 CLINICAL DATA:  Constant RIGHT face, RIGHT extremity numbness for 3 days. Follow-up acute stroke. EXAM: CT ANGIOGRAPHY HEAD AND NECK TECHNIQUE: Multidetector CT imaging of the head and neck was performed using the standard protocol during bolus administration of intravenous contrast. Multiplanar CT image reconstructions and MIPs were obtained to evaluate the vascular anatomy. Carotid stenosis measurements (when applicable) are obtained utilizing NASCET criteria, using the distal internal carotid diameter as the denominator. CONTRAST:  59mL ISOVUE-370 IOPAMIDOL (ISOVUE-370) INJECTION 76% COMPARISON:  MRI head August 04, 2018 FINDINGS: CT HEAD FINDINGS BRAIN: No intraparenchymal hemorrhage, mass effect nor midline shift. The ventricles and sulci are normal for age. RIGHT basal ganglia lacunar infarct. Minimal supratentorial white matter hypodensities less than expected for patient's age, though non-specific are most compatible with chronic small vessel ischemic disease. No acute large vascular territory infarcts. No abnormal extra-axial fluid collections. Basal cisterns are patent. VASCULAR: Trace calcific atherosclerosis of the carotid siphons. SKULL: No skull fracture. No significant scalp soft tissue swelling. SINUSES/ORBITS: Trace paranasal sinus mucosal  thickening. Mastoid air cells are well aerated.The included ocular globes and orbital contents are non-suspicious. Status post bilateral ocular lens implants. OTHER: Patient is edentulous. CTA NECK FINDINGS: AORTIC ARCH: Normal appearance of the thoracic arch, normal branch pattern. Trace calcific atherosclerosis. The origins of the innominate, left Common carotid artery and subclavian artery are widely patent. RIGHT CAROTID SYSTEM: Common carotid artery is patent. Mild calcific atherosclerosis of the carotid bifurcation without hemodynamically significant stenosis by NASCET criteria. Normal appearance of the internal carotid artery, tonsillar loop. LEFT CAROTID SYSTEM: Common carotid artery is patent. Mild calcific atherosclerosis of the carotid bifurcation without hemodynamically significant stenosis by NASCET criteria. Normal appearance of the internal carotid artery. VERTEBRAL ARTERIES:Left vertebral artery is dominant. Patent vertebral arteries, mild extrinsic compression due to degenerative cervical spine. SKELETON: No acute osseous process though bone windows have not been submitted. Severe RIGHT upper cervical facet arthropathy. Severe LEFT C3-4, moderate to severe LEFT C4-5, severe bilateral C5-6 neural foraminal narrowing. OTHER NECK: Soft tissues of the neck are nonacute though, not tailored for evaluation. UPPER CHEST: Mild pulmonary vascular congestion/edema. No superior mediastinal lymphadenopathy. CTA HEAD FINDINGS: ANTERIOR CIRCULATION: Patent cervical internal carotid arteries, petrous, cavernous  and supra clinoid internal carotid arteries. Mild LEFT ICA calcific atherosclerosis. 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 Patent anterior communicating artery. Patent anterior and middle cerebral arteries. No large vessel occlusion, significant stenosis,. POSTERIOR CIRCULATION: Patent vertebral arteries, vertebrobasilar junction and basilar artery, as well as main branch vessels. Patent  posterior cerebral arteries. RIGHT PCOM origin infundibulum. No large vessel occlusion, significant stenosis, contrast extravasation or aneurysm. VENOUS SINUSES: Major dural venous sinuses are patent though not tailored for evaluation on this angiographic examination. ANATOMIC VARIANTS: None. DELAYED PHASE: No abnormal intracranial enhancement. MIP images reviewed. IMPRESSION: CTA NECK: 1. No hemodynamically significant stenosis ICA's. Patent vertebral arteries. 2. Severe neural foraminal narrowing C3-4 and C5-6. CTA HEAD: 1. No emergent large vessel occlusion or flow-limiting stenosis. 2. 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 Aortic Atherosclerosis (ICD10-I70.0). Electronically Signed   By: Elon Alas M.D.   On: 08/04/2018 19:08   Ct Angio Neck W Or Wo Contrast  Result Date: 08/04/2018 CLINICAL DATA:  Constant RIGHT face, RIGHT extremity numbness for 3 days. Follow-up acute stroke. EXAM: CT ANGIOGRAPHY HEAD AND NECK TECHNIQUE: Multidetector CT imaging of the head and neck was performed using the standard protocol during bolus administration of intravenous contrast. Multiplanar CT image reconstructions and MIPs were obtained to evaluate the vascular anatomy. Carotid stenosis measurements (when applicable) are obtained utilizing NASCET criteria, using the distal internal carotid diameter as the denominator. CONTRAST:  14mL ISOVUE-370 IOPAMIDOL (ISOVUE-370) INJECTION 76% COMPARISON:  MRI head August 04, 2018 FINDINGS: CT HEAD FINDINGS BRAIN: No intraparenchymal hemorrhage, mass effect nor midline shift. The ventricles and sulci are normal for age. RIGHT basal ganglia lacunar infarct. Minimal supratentorial white matter hypodensities less than expected for patient's age, though non-specific are most compatible with chronic small vessel ischemic disease. No acute large vascular territory infarcts. No abnormal extra-axial fluid collections. Basal cisterns are patent. VASCULAR: Trace  calcific atherosclerosis of the carotid siphons. SKULL: No skull fracture. No significant scalp soft tissue swelling. SINUSES/ORBITS: Trace paranasal sinus mucosal thickening. Mastoid air cells are well aerated.The included ocular globes and orbital contents are non-suspicious. Status post bilateral ocular lens implants. OTHER: Patient is edentulous. CTA NECK FINDINGS: AORTIC ARCH: Normal appearance of the thoracic arch, normal branch pattern. Trace calcific atherosclerosis. The origins of the innominate, left Common carotid artery and subclavian artery are widely patent. RIGHT CAROTID SYSTEM: Common carotid artery is patent. Mild calcific atherosclerosis of the carotid bifurcation without hemodynamically significant stenosis by NASCET criteria. Normal appearance of the internal carotid artery, tonsillar loop. LEFT CAROTID SYSTEM: Common carotid artery is patent. Mild calcific atherosclerosis of the carotid bifurcation without hemodynamically significant stenosis by NASCET criteria. Normal appearance of the internal carotid artery. VERTEBRAL ARTERIES:Left vertebral artery is dominant. Patent vertebral arteries, mild extrinsic compression due to degenerative cervical spine. SKELETON: No acute osseous process though bone windows have not been submitted. Severe RIGHT upper cervical facet arthropathy. Severe LEFT C3-4, moderate to severe LEFT C4-5, severe bilateral C5-6 neural foraminal narrowing. OTHER NECK: Soft tissues of the neck are nonacute though, not tailored for evaluation. UPPER CHEST: Mild pulmonary vascular congestion/edema. No superior mediastinal lymphadenopathy. CTA HEAD FINDINGS: ANTERIOR CIRCULATION: Patent cervical internal carotid arteries, petrous, cavernous and supra clinoid internal carotid arteries. Mild LEFT ICA calcific atherosclerosis. 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 Patent anterior communicating artery. Patent anterior and middle cerebral arteries. No large vessel  occlusion, significant stenosis,. POSTERIOR CIRCULATION: Patent vertebral arteries, vertebrobasilar junction and basilar artery, as well as main branch vessels. Patent posterior cerebral arteries.  RIGHT PCOM origin infundibulum. No large vessel occlusion, significant stenosis, contrast extravasation or aneurysm. VENOUS SINUSES: Major dural venous sinuses are patent though not tailored for evaluation on this angiographic examination. ANATOMIC VARIANTS: None. DELAYED PHASE: No abnormal intracranial enhancement. MIP images reviewed. IMPRESSION: CTA NECK: 1. No hemodynamically significant stenosis ICA's. Patent vertebral arteries. 2. Severe neural foraminal narrowing C3-4 and C5-6. CTA HEAD: 1. No emergent large vessel occlusion or flow-limiting stenosis. 2. 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 Aortic Atherosclerosis (ICD10-I70.0). Electronically Signed   By: Elon Alas M.D.   On: 08/04/2018 19:08   Mr Brain Wo Contrast  Result Date: 08/04/2018 CLINICAL DATA:  Numbness of the right side of the face in the right upper extremity, 4 days duration. EXAM: MRI HEAD WITHOUT CONTRAST TECHNIQUE: Multiplanar, multiecho pulse sequences of the brain and surrounding structures were obtained without intravenous contrast. COMPARISON:  None. FINDINGS: Brain: Diffusion imaging shows a punctate acute infarction in the centrum semiovale a radiating white matter tracts on the right. No evidence of other recent infarction. Elsewhere, there are minimal small vessel changes of the white matter. No cortical or large vessel territory infarction. No mass lesion, hemorrhage, hydrocephalus or extra-axial collection. Dilated perivascular spaces incidentally present. Vascular: Major vessels at the base of the brain show flow. 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 No other acute  finding. It is not clear how this would relate to the right-sided symptoms described in the history. Chronic small-vessel ischemic changes of a mild degree elsewhere affecting the cerebral hemispheric white matter. Electronically Signed   By: Nelson Chimes M.D.   On: 08/04/2018 13:20    Microbiology: No results found for this or any previous visit (from the past 240 hour(s)).   Labs: Basic Metabolic Panel: Recent Labs  Lab 08/04/18 1238  NA 139  K 4.1  CL 103  CO2 28  GLUCOSE 135*  BUN 18  CREATININE 0.90  CALCIUM 8.9   Liver Function Tests: Recent Labs  Lab 08/04/18 1238  AST 18  ALT 15  ALKPHOS 80  BILITOT 0.8  PROT 7.2  ALBUMIN 4.1   No results for input(s): LIPASE, AMYLASE in the last 168 hours. No results for input(s): AMMONIA in the last 168 hours. CBC: Recent Labs  Lab 08/04/18 1238  WBC 5.8  NEUTROABS 3.6  HGB 12.0  HCT 37.6  MCV 85.6  PLT 190   Cardiac Enzymes: Recent Labs  Lab 08/04/18 1238  TROPONINI <0.03   BNP: BNP (last 3 results) No results for input(s): BNP in the last 8760 hours.  ProBNP (last 3 results) No results for input(s): PROBNP in the last 8760 hours.  CBG: No results for input(s): GLUCAP in the last 168 hours.     Signed:  Lelon Frohlich  [facility] Pager: [phone] 08/05/2018, 4:06 PM

## 2018-08-06 LAB — RPR: RPR Ser Ql: NONREACTIVE

## 2018-08-08 LAB — HOMOCYSTEINE: HOMOCYSTEINE-NORM: 15.7 umol/L — AB (ref 0.0–15.0)

## 2018-08-09 DIAGNOSIS — I639 Cerebral infarction, unspecified: Secondary | ICD-10-CM | POA: Diagnosis not present

## 2018-08-09 DIAGNOSIS — M545 Low back pain: Secondary | ICD-10-CM | POA: Diagnosis not present

## 2018-08-09 DIAGNOSIS — I10 Essential (primary) hypertension: Secondary | ICD-10-CM | POA: Diagnosis not present

## 2018-08-09 DIAGNOSIS — E782 Mixed hyperlipidemia: Secondary | ICD-10-CM | POA: Diagnosis not present

## 2018-08-30 DIAGNOSIS — G8929 Other chronic pain: Secondary | ICD-10-CM | POA: Diagnosis not present

## 2018-08-30 DIAGNOSIS — I10 Essential (primary) hypertension: Secondary | ICD-10-CM | POA: Diagnosis not present

## 2018-08-30 DIAGNOSIS — M545 Low back pain: Secondary | ICD-10-CM | POA: Diagnosis not present

## 2018-08-30 DIAGNOSIS — Z0000 Encounter for general adult medical examination without abnormal findings: Secondary | ICD-10-CM | POA: Diagnosis not present

## 2018-09-16 DIAGNOSIS — D509 Iron deficiency anemia, unspecified: Secondary | ICD-10-CM | POA: Diagnosis not present

## 2018-09-16 DIAGNOSIS — R7301 Impaired fasting glucose: Secondary | ICD-10-CM | POA: Diagnosis not present

## 2018-09-16 DIAGNOSIS — I10 Essential (primary) hypertension: Secondary | ICD-10-CM | POA: Diagnosis not present

## 2018-09-16 DIAGNOSIS — E782 Mixed hyperlipidemia: Secondary | ICD-10-CM | POA: Diagnosis not present

## 2018-09-20 DIAGNOSIS — R7301 Impaired fasting glucose: Secondary | ICD-10-CM | POA: Diagnosis not present

## 2018-09-20 DIAGNOSIS — D509 Iron deficiency anemia, unspecified: Secondary | ICD-10-CM | POA: Diagnosis not present

## 2018-09-20 DIAGNOSIS — E782 Mixed hyperlipidemia: Secondary | ICD-10-CM | POA: Diagnosis not present

## 2018-09-20 DIAGNOSIS — I10 Essential (primary) hypertension: Secondary | ICD-10-CM | POA: Diagnosis not present

## 2018-10-12 ENCOUNTER — Ambulatory Visit (INDEPENDENT_AMBULATORY_CARE_PROVIDER_SITE_OTHER): Payer: Self-pay

## 2018-10-12 ENCOUNTER — Encounter (INDEPENDENT_AMBULATORY_CARE_PROVIDER_SITE_OTHER): Payer: Self-pay | Admitting: Physician Assistant

## 2018-10-12 ENCOUNTER — Ambulatory Visit (INDEPENDENT_AMBULATORY_CARE_PROVIDER_SITE_OTHER): Payer: Medicare Other | Admitting: Physician Assistant

## 2018-10-12 DIAGNOSIS — M545 Low back pain, unspecified: Secondary | ICD-10-CM

## 2018-10-12 DIAGNOSIS — G8929 Other chronic pain: Secondary | ICD-10-CM | POA: Diagnosis not present

## 2018-10-12 DIAGNOSIS — M25551 Pain in right hip: Secondary | ICD-10-CM | POA: Diagnosis not present

## 2018-10-12 NOTE — Progress Notes (Signed)
Subjective: She is here for possible ultrasound-guided right hip injection.  She is having a lot of posterior hip pain, but on exam she has severe pain with passive internal hip rotation as well as limited range of motion.  Objective: Tender in the posterior lateral right hip, and also has pain in that area with passive internal rotation.  Procedure: Ultrasound-guided right hip injection: After sterile prep with Betadine, injected 5 cc 1% lidocaine without epinephrine and 40 mg methylprednisolone using a 22-gauge spinal needle, passing the needle through the iliofemoral ligament into the femoral head/neck junction.  Injectate was seen filling the joint capsule.  She had a significant improvement in pain during the anesthetic phase.  She will follow-up as scheduled.

## 2018-10-12 NOTE — Progress Notes (Signed)
Office Visit Note   Patient: [name]           Date of Birth: 08-06-1946           MRN: [id number] Visit Date: 10/12/2018              Requested by: Dawn Squibb, MD [place], Dawn [id number] PCP: Dawn Squibb, MD   Assessment & Plan: Visit Diagnoses:  1. Pain in right hip   2. Chronic low back pain without sciatica, unspecified back pain laterality     Plan: Due to patient's decreased range of motion of the right hip and pain with internal rotation of the hip recommend an intra-articular injection with the hip.  This is able to be performed by [name] today under ultrasound.  Please see his note regarding this procedure. We will have her follow-up with [place] in 2 weeks check her response to the injection to discuss further treatment.  Questions were encouraged and answered both patient and her husband was present today.  She will continue to get her pain medication from her primary care physician.  Follow-Up Instructions: No follow-ups on file.   Orders:  Orders Placed This Encounter  Procedures  . XR HIP UNILAT W OR W/O PELVIS 2-3 VIEWS RIGHT  . XR Lumbar Spine 2-3 Views   No orders of the defined types were placed in this encounter.     Procedures: No procedures performed   Clinical Data: No additional findings.   Subjective: Chief Complaint  Patient presents with  . Right Hip - Pain    HPI  [name] comes in today due to low back pain and right hip pain.  She underwent back surgery by [name] a year ago.  States that she has had pain in her back since that time but it did get a little bit better over the summer not completely resolved.  She is to contact [name] office they have sent her to [name] for an epidural steroid injection and she states this actually made her pain in her back worse.  Her main complaint today though is pain in the right hip she states the hip catches almost causes her fall at times.  She is having  difficulty sleeping due to the increased low back pain and hip pain.  She denies any radicular symptoms down either leg.  She is had no new injury.  She is using a walking stick to ambulate due to the pain and the fact that she feels the hip may give way on her.  She states that she is only able to take oxycodone for pain is been having to take an increased dosage just to control her pain.  She is been getting this from primary care physician.  Review of Systems Please see HPI.  Objective: Vital Signs: There were no vitals taken for this visit.  Physical Exam  Constitutional: She is oriented to person, place, and time. She appears well-developed and well-nourished. No distress.  Cardiovascular: Intact distal pulses.  Pulmonary/Chest: Effort normal.  Neurological: She is alert and oriented to person, place, and time.  Skin: She is not diaphoretic.  Psychiatric: She has a normal mood and affect.    Ortho Exam Lower extremity she has 5 out of 5 strength throughout the lower extremities except for the right hip with flexion she has 4 out of 5 strength.  Sensation grossly intact bilateral feet.  She has negative  straight leg raise bilaterally.  Left hip fluid range of motion without pain.  Right hip she has pain with internal and external rotation is slightly limited internal rotation compared to the left.  Circumflexion of the right hip causes pain but it is not on the left.  Logrolling of the right hip also causes pain in the posterior hip region.  Tenderness over the right trochanteric region no tenderness over the left.  Calves are supple nontender bilaterally. Specialty Comments:  No specialty comments available.  Imaging: Xr Hip Unilat W Or W/o Pelvis 2-3 Views Right  Result Date: 10/12/2018 AP pelvis lateral view of the right hip: Bilateral hips well located.  No acute fracture of the hip or pelvis.  No significant bony abnormalities.  Impingement with a spur off of the superior aspect of  the acetabulum.  Xr Lumbar Spine 2-3 Views  Result Date: 10/12/2018 Lumbar spine AP and lateral views: Shows status post lumbar fusion l 2 through S1 no hardware failure.  No acute fractures.    PMFS History: Patient Active Problem List   Diagnosis Date Noted  . CVA (cerebral vascular accident) ([place]) 08/04/2018  . Stroke ([place] [facility]) 08/04/2018  . Chronic back pain 08/04/2018  . Post-operative pain 09/20/2017  . 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 Lumbar stenosis with neurogenic claudication 02/04/2015  . Stiffness of joints, not elsewhere classified, multiple sites 10/27/2012  . Weakness of both legs 10/27/2012  . Difficulty in walking(719.7) 10/27/2012   Past Medical History:  Diagnosis Date  . Arthritis   . Chronic back pain   . Hypertension    no meds in 2 yrs pt took self off  . PONV (postoperative nausea and vomiting)     Family History  Problem Relation Age of Onset  . Hypertension Mother   . Breast cancer Neg Hx   . Stroke Neg Hx     Past Surgical History:  Procedure Laterality Date  . ABDOMINAL HYSTERECTOMY  82  . BACK SURGERY  2010  . BACK SURGERY  2015  . BACK SURGERY  06/2016  . CATARACT EXTRACTION W/PHACO Left 01/07/2017   Procedure: CATARACT EXTRACTION PHACO AND INTRAOCULAR LENS PLACEMENT (IOC);  Surgeon: Dawn Branch, MD;  Location: [facility];  Service: Ophthalmology;  Laterality: Left;  left CDE 9.31   . CATARACT EXTRACTION W/PHACO Right 01/25/2017   Procedure: CATARACT EXTRACTION PHACO AND INTRAOCULAR LENS PLACEMENT ([place]) CDE:16.07;  Surgeon: Dawn Branch, MD;  Location: [facility];  Service: Ophthalmology;  Laterality: Right;  right  . FRACTURE SURGERY Right    arm  1974  . LUMBAR WOUND DEBRIDEMENT N/A 09/23/2017   Procedure: Exploration of Lumbar Wound; Repair of Pseudomeningocele;  Surgeon: Dawn Pies, MD;  Location: [facility];  Service: Neurosurgery;  Laterality: N/A;   Social History   Occupational History  . Not on file  Tobacco Use  . Smoking  status: Never Smoker  . Smokeless tobacco: Never Used  Substance and Sexual Activity  . Alcohol use: No  . Drug use: No  . Sexual activity: Never

## 2018-10-19 ENCOUNTER — Ambulatory Visit (INDEPENDENT_AMBULATORY_CARE_PROVIDER_SITE_OTHER): Payer: Medicare Other | Admitting: Orthopaedic Surgery

## 2018-10-19 ENCOUNTER — Other Ambulatory Visit (INDEPENDENT_AMBULATORY_CARE_PROVIDER_SITE_OTHER): Payer: Self-pay

## 2018-10-19 ENCOUNTER — Encounter (INDEPENDENT_AMBULATORY_CARE_PROVIDER_SITE_OTHER): Payer: Self-pay | Admitting: Orthopaedic Surgery

## 2018-10-19 DIAGNOSIS — M549 Dorsalgia, unspecified: Secondary | ICD-10-CM

## 2018-10-19 DIAGNOSIS — M48062 Spinal stenosis, lumbar region with neurogenic claudication: Secondary | ICD-10-CM

## 2018-10-19 DIAGNOSIS — G8929 Other chronic pain: Secondary | ICD-10-CM

## 2018-10-19 DIAGNOSIS — M25551 Pain in right hip: Secondary | ICD-10-CM

## 2018-10-19 MED ORDER — OXYCODONE-ACETAMINOPHEN 5-325 MG PO TABS: 1.0000 | ORAL_TABLET | ORAL | 0 refills | Status: DC | PRN

## 2018-10-19 NOTE — Progress Notes (Signed)
The patient comes in today with continued severe right-sided low back pain that radiates in the sciatic region.  She was sent to me to make sure that this was not a hip issue.  Her right hip x-rays showed only minimal acetabular bone spur but well-maintained joint space.  We had an intra-articular steroid injection placed in her right hip joint.  She comes today saying that that did not help her at all.  On examination of her right hip I can easily move her right hip joint without any issues at all moving the hip through internal and external rotation with no pain in the groin and no lateral pain.  From my standpoint, I do not feel that this is a hip issue at all.  She has had previous extensive lumbar spine surgery.  According to the patient, the neurosurgeon is seeing her on 11/01/2018.  She hopefully can get that appointment moved up.  She is actually seen him before and been referred to that office is physiatrist for injections and they have recommended a spinal cord stimulator.  I will send in at least a few days of oxycodone and see if we can get her appointment moved up at all.  From my standpoint, she will follow-up as needed.

## 2018-10-26 ENCOUNTER — Ambulatory Visit (INDEPENDENT_AMBULATORY_CARE_PROVIDER_SITE_OTHER): Payer: Medicare Other | Admitting: Orthopaedic Surgery

## 2018-11-01 DIAGNOSIS — R030 Elevated blood-pressure reading, without diagnosis of hypertension: Secondary | ICD-10-CM | POA: Diagnosis not present

## 2018-11-01 DIAGNOSIS — M545 Low back pain: Secondary | ICD-10-CM | POA: Diagnosis not present

## 2018-11-10 DIAGNOSIS — M545 Low back pain: Secondary | ICD-10-CM | POA: Diagnosis not present

## 2018-11-10 DIAGNOSIS — M4326 Fusion of spine, lumbar region: Secondary | ICD-10-CM | POA: Diagnosis not present

## 2018-11-10 DIAGNOSIS — M5440 Lumbago with sciatica, unspecified side: Secondary | ICD-10-CM | POA: Diagnosis not present

## 2018-11-15 DIAGNOSIS — I10 Essential (primary) hypertension: Secondary | ICD-10-CM | POA: Diagnosis not present

## 2018-11-15 DIAGNOSIS — M545 Low back pain: Secondary | ICD-10-CM | POA: Diagnosis not present

## 2018-11-23 ENCOUNTER — Other Ambulatory Visit: Payer: Self-pay | Admitting: Neurosurgery

## 2018-11-23 DIAGNOSIS — M545 Low back pain, unspecified: Secondary | ICD-10-CM

## 2018-11-23 DIAGNOSIS — G8929 Other chronic pain: Secondary | ICD-10-CM

## 2018-11-28 ENCOUNTER — Ambulatory Visit
Admission: RE | Admit: 2018-11-28 | Discharge: 2018-11-28 | Disposition: A | Discharge status: Home / Self Care | Payer: Medicare Other | Source: Ambulatory Visit | Attending: Neurosurgery | Admitting: Neurosurgery

## 2018-11-28 DIAGNOSIS — M545 Low back pain, unspecified: Secondary | ICD-10-CM

## 2018-11-28 DIAGNOSIS — G8929 Other chronic pain: Secondary | ICD-10-CM

## 2018-11-30 DIAGNOSIS — M9902 Segmental and somatic dysfunction of thoracic region: Secondary | ICD-10-CM | POA: Diagnosis not present

## 2018-11-30 DIAGNOSIS — M9905 Segmental and somatic dysfunction of pelvic region: Secondary | ICD-10-CM | POA: Diagnosis not present

## 2018-11-30 DIAGNOSIS — M9903 Segmental and somatic dysfunction of lumbar region: Secondary | ICD-10-CM | POA: Diagnosis not present

## 2018-11-30 DIAGNOSIS — M5416 Radiculopathy, lumbar region: Secondary | ICD-10-CM | POA: Diagnosis not present

## 2018-12-02 DIAGNOSIS — M9902 Segmental and somatic dysfunction of thoracic region: Secondary | ICD-10-CM | POA: Diagnosis not present

## 2018-12-02 DIAGNOSIS — M9905 Segmental and somatic dysfunction of pelvic region: Secondary | ICD-10-CM | POA: Diagnosis not present

## 2018-12-02 DIAGNOSIS — M5416 Radiculopathy, lumbar region: Secondary | ICD-10-CM | POA: Diagnosis not present

## 2018-12-02 DIAGNOSIS — M9903 Segmental and somatic dysfunction of lumbar region: Secondary | ICD-10-CM | POA: Diagnosis not present

## 2018-12-05 DIAGNOSIS — M9903 Segmental and somatic dysfunction of lumbar region: Secondary | ICD-10-CM | POA: Diagnosis not present

## 2018-12-05 DIAGNOSIS — M9905 Segmental and somatic dysfunction of pelvic region: Secondary | ICD-10-CM | POA: Diagnosis not present

## 2018-12-05 DIAGNOSIS — M5416 Radiculopathy, lumbar region: Secondary | ICD-10-CM | POA: Diagnosis not present

## 2018-12-05 DIAGNOSIS — M9902 Segmental and somatic dysfunction of thoracic region: Secondary | ICD-10-CM | POA: Diagnosis not present

## 2018-12-08 DIAGNOSIS — M5416 Radiculopathy, lumbar region: Secondary | ICD-10-CM | POA: Diagnosis not present

## 2018-12-08 DIAGNOSIS — M9903 Segmental and somatic dysfunction of lumbar region: Secondary | ICD-10-CM | POA: Diagnosis not present

## 2018-12-08 DIAGNOSIS — M9905 Segmental and somatic dysfunction of pelvic region: Secondary | ICD-10-CM | POA: Diagnosis not present

## 2018-12-08 DIAGNOSIS — M9902 Segmental and somatic dysfunction of thoracic region: Secondary | ICD-10-CM | POA: Diagnosis not present

## 2018-12-12 DIAGNOSIS — M9905 Segmental and somatic dysfunction of pelvic region: Secondary | ICD-10-CM | POA: Diagnosis not present

## 2018-12-12 DIAGNOSIS — M5416 Radiculopathy, lumbar region: Secondary | ICD-10-CM | POA: Diagnosis not present

## 2018-12-12 DIAGNOSIS — M9902 Segmental and somatic dysfunction of thoracic region: Secondary | ICD-10-CM | POA: Diagnosis not present

## 2018-12-12 DIAGNOSIS — M9903 Segmental and somatic dysfunction of lumbar region: Secondary | ICD-10-CM | POA: Diagnosis not present

## 2018-12-19 DIAGNOSIS — M9903 Segmental and somatic dysfunction of lumbar region: Secondary | ICD-10-CM | POA: Diagnosis not present

## 2018-12-19 DIAGNOSIS — M9902 Segmental and somatic dysfunction of thoracic region: Secondary | ICD-10-CM | POA: Diagnosis not present

## 2018-12-19 DIAGNOSIS — M5416 Radiculopathy, lumbar region: Secondary | ICD-10-CM | POA: Diagnosis not present

## 2018-12-19 DIAGNOSIS — M9905 Segmental and somatic dysfunction of pelvic region: Secondary | ICD-10-CM | POA: Diagnosis not present

## 2018-12-23 DIAGNOSIS — M9905 Segmental and somatic dysfunction of pelvic region: Secondary | ICD-10-CM | POA: Diagnosis not present

## 2018-12-23 DIAGNOSIS — M9902 Segmental and somatic dysfunction of thoracic region: Secondary | ICD-10-CM | POA: Diagnosis not present

## 2018-12-23 DIAGNOSIS — M9903 Segmental and somatic dysfunction of lumbar region: Secondary | ICD-10-CM | POA: Diagnosis not present

## 2018-12-23 DIAGNOSIS — M5416 Radiculopathy, lumbar region: Secondary | ICD-10-CM | POA: Diagnosis not present

## 2018-12-28 DIAGNOSIS — M9903 Segmental and somatic dysfunction of lumbar region: Secondary | ICD-10-CM | POA: Diagnosis not present

## 2018-12-28 DIAGNOSIS — M9902 Segmental and somatic dysfunction of thoracic region: Secondary | ICD-10-CM | POA: Diagnosis not present

## 2018-12-28 DIAGNOSIS — M9905 Segmental and somatic dysfunction of pelvic region: Secondary | ICD-10-CM | POA: Diagnosis not present

## 2018-12-28 DIAGNOSIS — M5416 Radiculopathy, lumbar region: Secondary | ICD-10-CM | POA: Diagnosis not present

## 2018-12-30 DIAGNOSIS — M9903 Segmental and somatic dysfunction of lumbar region: Secondary | ICD-10-CM | POA: Diagnosis not present

## 2018-12-30 DIAGNOSIS — M9902 Segmental and somatic dysfunction of thoracic region: Secondary | ICD-10-CM | POA: Diagnosis not present

## 2018-12-30 DIAGNOSIS — M9905 Segmental and somatic dysfunction of pelvic region: Secondary | ICD-10-CM | POA: Diagnosis not present

## 2018-12-30 DIAGNOSIS — M5416 Radiculopathy, lumbar region: Secondary | ICD-10-CM | POA: Diagnosis not present

## 2019-01-03 DIAGNOSIS — M9902 Segmental and somatic dysfunction of thoracic region: Secondary | ICD-10-CM | POA: Diagnosis not present

## 2019-01-03 DIAGNOSIS — M5416 Radiculopathy, lumbar region: Secondary | ICD-10-CM | POA: Diagnosis not present

## 2019-01-03 DIAGNOSIS — M9905 Segmental and somatic dysfunction of pelvic region: Secondary | ICD-10-CM | POA: Diagnosis not present

## 2019-01-03 DIAGNOSIS — M9903 Segmental and somatic dysfunction of lumbar region: Secondary | ICD-10-CM | POA: Diagnosis not present

## 2019-01-10 DIAGNOSIS — M9905 Segmental and somatic dysfunction of pelvic region: Secondary | ICD-10-CM | POA: Diagnosis not present

## 2019-01-10 DIAGNOSIS — M9903 Segmental and somatic dysfunction of lumbar region: Secondary | ICD-10-CM | POA: Diagnosis not present

## 2019-01-10 DIAGNOSIS — M9902 Segmental and somatic dysfunction of thoracic region: Secondary | ICD-10-CM | POA: Diagnosis not present

## 2019-01-10 DIAGNOSIS — M5416 Radiculopathy, lumbar region: Secondary | ICD-10-CM | POA: Diagnosis not present

## 2019-01-11 ENCOUNTER — Encounter (INDEPENDENT_AMBULATORY_CARE_PROVIDER_SITE_OTHER): Payer: Self-pay | Admitting: Family

## 2019-01-11 ENCOUNTER — Ambulatory Visit (INDEPENDENT_AMBULATORY_CARE_PROVIDER_SITE_OTHER): Payer: Medicare Other

## 2019-01-11 ENCOUNTER — Ambulatory Visit (INDEPENDENT_AMBULATORY_CARE_PROVIDER_SITE_OTHER): Payer: Medicare Other | Admitting: Family

## 2019-01-11 VITALS — Ht 64.0 in | Wt 202.8 lb

## 2019-01-11 DIAGNOSIS — M1712 Unilateral primary osteoarthritis, left knee: Secondary | ICD-10-CM | POA: Diagnosis not present

## 2019-01-11 DIAGNOSIS — M25562 Pain in left knee: Secondary | ICD-10-CM

## 2019-01-11 MED ORDER — LIDOCAINE HCL 1 % IJ SOLN
5.0000 mL | INTRAMUSCULAR | Status: AC | PRN
  Administered 2019-01-11: 5 mL

## 2019-01-11 MED ORDER — METHYLPREDNISOLONE ACETATE 40 MG/ML IJ SUSP
40.0000 mg | INTRAMUSCULAR | Status: AC | PRN
  Administered 2019-01-11: 40 mg via INTRA_ARTICULAR

## 2019-01-11 NOTE — Progress Notes (Signed)
Office Visit Note   Patient: [name]           Date of Birth: [birth year]           MRN: [id number] Visit Date: 01/11/2019              Requested by: Celene Squibb, MD [address] [place], [place] [address] PCP: Celene Squibb, MD  Chief Complaint  Patient presents with  . Left Knee - Pain    Request L knee Cortisone Inj      HPI: The patient is a 73 year old woman who presents today complaining of left knee pain.  This is been ongoing for several weeks.  Notes that she has increased her activities and is currently doing water aerobics several mornings a week for 30 minutes.  Has had worsening of her knee pain for last 2 weeks.  She is currently taking Tylenol and ibuprofen with minimal relief.  Has been having some locking and catching.  Complains of mild swelling pivoting with her knee is most painful.  She is wearing a knee brace and using a straight cane to assist with her ambulation today.  Is well-known to Dr. Ninfa Linden.  Assessment & Plan: Visit Diagnoses:  1. Acute pain of left knee     Plan: Depo-Medrol injection left knee.  Encouraged her to continue with her water aerobics and quad strengthening.  Tolerated injection well.  She will follow-up in the office as needed.  Follow-Up Instructions: Return in about 3 months (around 04/11/2019), or if symptoms worsen or fail to improve.   Left Knee Exam   Muscle Strength  The patient has normal left knee strength.  Tenderness  The patient is experiencing tenderness in the medial joint line.  Range of Motion  The patient has normal left knee ROM.  Tests  [place]:  Medial - negative Lateral - negative  Other  Erythema: absent Sensation: normal Swelling: mild Effusion: no effusion present      Patient is alert, oriented, no adenopathy, well-dressed, normal affect, normal respiratory effort.   Imaging: Xr Knee 1-2 Views Left  Result Date: 01/11/2019 Radiographs of left knee show significant joint  space narrowing with bony spurring. No acute finding.  No images are attached to the encounter.  Labs: Lab Results  Component Value Date   HGBA1C 6.1 (H) 08/05/2018   ESRSEDRATE 38 (H) 08/05/2018   CRP <0.8 08/04/2018   REPTSTATUS 09/28/2017 FINAL 09/23/2017   GRAMSTAIN  09/23/2017    RARE WBC PRESENT, PREDOMINANTLY PMN NO ORGANISMS SEEN    CULT No growth aerobically or anaerobically. 09/23/2017     Lab Results  Component Value Date   ALBUMIN 4.1 08/04/2018   ALBUMIN 3.4 (L) 09/20/2017   ALBUMIN 4.3 01/26/2011    Body mass index is 34.81 kg/m.  Orders:  Orders Placed This Encounter  Procedures  . XR Knee 1-2 Views Left   No orders of the defined types were placed in this encounter.    Procedures: Large Joint Inj: L knee on 01/11/2019 3:04 PM Indications: pain Details: 18 G 1.5 in needle, anteromedial approach Medications: 5 mL lidocaine 1 %; 40 mg methylPREDNISolone acetate 40 MG/ML Consent was given by the patient.      Clinical Data: No additional findings.  ROS:  All other systems negative, except as noted in the HPI. Review of Systems  Constitutional: Negative for chills and fever.  Musculoskeletal: Positive for arthralgias and joint swelling.    Objective: Vital  Signs: Ht 5\' 4"  (1.626 m)   Wt 202 lb 12.8 oz (92 kg)   BMI 34.81 kg/m   Specialty Comments:  No specialty comments available.  PMFS History: Patient Active Problem List   Diagnosis Date Noted  . CVA (cerebral vascular accident) ([facility]) 08/04/2018  . Stroke ([facility]) 08/04/2018  . Chronic back pain 08/04/2018  . Post-operative pain 09/20/2017  . 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 Lumbar stenosis with neurogenic claudication 02/04/2015  . Stiffness of joints, not elsewhere classified, multiple sites 10/27/2012  . Weakness of both legs 10/27/2012  . Difficulty in walking(719.7) 10/27/2012   Past Medical History:  Diagnosis Date  . Arthritis   . Chronic back pain   .  Hypertension    no meds in 2 yrs pt took self off  . PONV (postoperative nausea and vomiting)     Family History  Problem Relation Age of Onset  . Hypertension Mother   . Breast cancer Neg Hx   . Stroke Neg Hx     Past Surgical History:  Procedure Laterality Date  . ABDOMINAL HYSTERECTOMY  82  . BACK SURGERY  2010  . BACK SURGERY  2015  . BACK SURGERY  06/2016  . CATARACT EXTRACTION W/PHACO Left 01/07/2017   Procedure: CATARACT EXTRACTION PHACO AND INTRAOCULAR LENS PLACEMENT ([facility]);  Surgeon: Tonny Branch, MD;  Location: [facility];  Service: Ophthalmology;  Laterality: Left;  left CDE 9.31   . CATARACT EXTRACTION W/PHACO Right 01/25/2017   Procedure: CATARACT EXTRACTION PHACO AND INTRAOCULAR LENS PLACEMENT ([facility]) CDE:16.07;  Surgeon: Tonny Branch, MD;  Location: [facility];  Service: Ophthalmology;  Laterality: Right;  right  . FRACTURE SURGERY Right    arm  1974  . LUMBAR WOUND DEBRIDEMENT N/A 09/23/2017   Procedure: Exploration of Lumbar Wound; Repair of Pseudomeningocele;  Surgeon: Newman Pies, MD;  Location: [facility];  Service: Neurosurgery;  Laterality: N/A;   Social History   Occupational History  . Not on file  Tobacco Use  . Smoking status: Never Smoker  . Smokeless tobacco: Never Used  Substance and Sexual Activity  . Alcohol use: No  . Drug use: No  . Sexual activity: Never

## 2019-01-12 DIAGNOSIS — M9905 Segmental and somatic dysfunction of pelvic region: Secondary | ICD-10-CM | POA: Diagnosis not present

## 2019-01-12 DIAGNOSIS — M5416 Radiculopathy, lumbar region: Secondary | ICD-10-CM | POA: Diagnosis not present

## 2019-01-12 DIAGNOSIS — M9903 Segmental and somatic dysfunction of lumbar region: Secondary | ICD-10-CM | POA: Diagnosis not present

## 2019-01-12 DIAGNOSIS — M9902 Segmental and somatic dysfunction of thoracic region: Secondary | ICD-10-CM | POA: Diagnosis not present

## 2019-01-19 DIAGNOSIS — M5416 Radiculopathy, lumbar region: Secondary | ICD-10-CM | POA: Diagnosis not present

## 2019-01-19 DIAGNOSIS — M9903 Segmental and somatic dysfunction of lumbar region: Secondary | ICD-10-CM | POA: Diagnosis not present

## 2019-01-19 DIAGNOSIS — M9905 Segmental and somatic dysfunction of pelvic region: Secondary | ICD-10-CM | POA: Diagnosis not present

## 2019-01-19 DIAGNOSIS — M9902 Segmental and somatic dysfunction of thoracic region: Secondary | ICD-10-CM | POA: Diagnosis not present

## 2019-01-26 DIAGNOSIS — M9902 Segmental and somatic dysfunction of thoracic region: Secondary | ICD-10-CM | POA: Diagnosis not present

## 2019-01-26 DIAGNOSIS — M9903 Segmental and somatic dysfunction of lumbar region: Secondary | ICD-10-CM | POA: Diagnosis not present

## 2019-01-26 DIAGNOSIS — M5416 Radiculopathy, lumbar region: Secondary | ICD-10-CM | POA: Diagnosis not present

## 2019-01-26 DIAGNOSIS — M9905 Segmental and somatic dysfunction of pelvic region: Secondary | ICD-10-CM | POA: Diagnosis not present

## 2019-02-08 DIAGNOSIS — D509 Iron deficiency anemia, unspecified: Secondary | ICD-10-CM | POA: Diagnosis not present

## 2019-02-08 DIAGNOSIS — I10 Essential (primary) hypertension: Secondary | ICD-10-CM | POA: Diagnosis not present

## 2019-02-08 DIAGNOSIS — R7301 Impaired fasting glucose: Secondary | ICD-10-CM | POA: Diagnosis not present

## 2019-02-08 DIAGNOSIS — E782 Mixed hyperlipidemia: Secondary | ICD-10-CM | POA: Diagnosis not present

## 2019-02-15 DIAGNOSIS — I10 Essential (primary) hypertension: Secondary | ICD-10-CM | POA: Diagnosis not present

## 2019-02-15 DIAGNOSIS — M1712 Unilateral primary osteoarthritis, left knee: Secondary | ICD-10-CM | POA: Diagnosis not present

## 2019-02-15 DIAGNOSIS — E782 Mixed hyperlipidemia: Secondary | ICD-10-CM | POA: Diagnosis not present

## 2019-02-15 DIAGNOSIS — R7301 Impaired fasting glucose: Secondary | ICD-10-CM | POA: Diagnosis not present

## 2019-02-15 DIAGNOSIS — D509 Iron deficiency anemia, unspecified: Secondary | ICD-10-CM | POA: Diagnosis not present

## 2019-03-09 DIAGNOSIS — M48062 Spinal stenosis, lumbar region with neurogenic claudication: Secondary | ICD-10-CM | POA: Diagnosis not present

## 2019-03-09 DIAGNOSIS — G4762 Sleep related leg cramps: Secondary | ICD-10-CM | POA: Diagnosis not present

## 2019-03-09 DIAGNOSIS — I10 Essential (primary) hypertension: Secondary | ICD-10-CM | POA: Diagnosis not present

## 2019-03-09 DIAGNOSIS — R2689 Other abnormalities of gait and mobility: Secondary | ICD-10-CM | POA: Diagnosis not present

## 2019-03-09 DIAGNOSIS — J309 Allergic rhinitis, unspecified: Secondary | ICD-10-CM | POA: Diagnosis not present

## 2019-03-29 DIAGNOSIS — G4762 Sleep related leg cramps: Secondary | ICD-10-CM | POA: Diagnosis not present

## 2019-03-29 DIAGNOSIS — J309 Allergic rhinitis, unspecified: Secondary | ICD-10-CM | POA: Diagnosis not present

## 2019-03-29 DIAGNOSIS — I10 Essential (primary) hypertension: Secondary | ICD-10-CM | POA: Diagnosis not present

## 2019-03-29 DIAGNOSIS — R2689 Other abnormalities of gait and mobility: Secondary | ICD-10-CM | POA: Diagnosis not present

## 2019-03-29 DIAGNOSIS — M48062 Spinal stenosis, lumbar region with neurogenic claudication: Secondary | ICD-10-CM | POA: Diagnosis not present

## 2019-03-31 DIAGNOSIS — Z0000 Encounter for general adult medical examination without abnormal findings: Secondary | ICD-10-CM | POA: Diagnosis not present

## 2019-04-18 ENCOUNTER — Other Ambulatory Visit: Payer: Self-pay

## 2019-04-18 ENCOUNTER — Encounter: Payer: Self-pay | Admitting: Orthopaedic Surgery

## 2019-04-18 ENCOUNTER — Ambulatory Visit: Payer: Self-pay

## 2019-04-18 ENCOUNTER — Ambulatory Visit: Payer: Medicare Other | Admitting: Orthopaedic Surgery

## 2019-04-18 DIAGNOSIS — M25562 Pain in left knee: Secondary | ICD-10-CM | POA: Diagnosis not present

## 2019-04-18 DIAGNOSIS — G8929 Other chronic pain: Secondary | ICD-10-CM | POA: Diagnosis not present

## 2019-04-18 DIAGNOSIS — M25561 Pain in right knee: Secondary | ICD-10-CM

## 2019-04-18 DIAGNOSIS — M1712 Unilateral primary osteoarthritis, left knee: Secondary | ICD-10-CM | POA: Insufficient documentation

## 2019-04-18 DIAGNOSIS — M1711 Unilateral primary osteoarthritis, right knee: Secondary | ICD-10-CM

## 2019-04-18 MED ORDER — LIDOCAINE HCL 1 % IJ SOLN
3.0000 mL | INTRAMUSCULAR | Status: AC | PRN
  Administered 2019-04-18: 3 mL

## 2019-04-18 MED ORDER — METHYLPREDNISOLONE ACETATE 40 MG/ML IJ SUSP
40.0000 mg | INTRAMUSCULAR | Status: AC | PRN
  Administered 2019-04-18: 40 mg via INTRA_ARTICULAR

## 2019-04-18 NOTE — Progress Notes (Signed)
Office Visit Note   Patient: [name]           Date of Birth: [birth year]           MRN: [id number] Visit Date: 04/18/2019              Requested by: Celene Squibb, MD [address] [place], [place] [address] PCP: Celene Squibb, MD   Assessment & Plan: Visit Diagnoses:  1. Chronic pain of left knee   2. Chronic pain of right knee   3. Unilateral primary osteoarthritis, left knee   4. Unilateral primary osteoarthritis, right knee     Plan: I do feel that she is a candidate for knee replacement surgery.  We would pursue the right knee first.  We will need to work on getting this scheduled sometime in hopefully the next 1 to 2 months.  We will start with the right knee first and then address her left knee at a later date.  I did place steroid injections per her wishes in both knees today.  She understands risk and benefits of these as well.  I did show her knee replacement model and talked about what the surgery involves.  We had a long and thorough discussion about the risk and benefits of surgery as well as what the intraoperative and postoperative course involved.  All question concerns were answered and addressed.  We will work on getting things scheduled.  Follow-Up Instructions: Return for 2 weeks post-op.   Orders:  Orders Placed This Encounter  Procedures   Large Joint Inj   Large Joint Inj   XR Knee 1-2 Views Left   XR Knee 1-2 Views Right   No orders of the defined types were placed in this encounter.     Procedures: Large Joint Inj: R knee on 04/18/2019 9:18 AM Indications: diagnostic evaluation and pain Details: 22 G 1.5 in needle, superolateral approach  Arthrogram: No  Medications: 3 mL lidocaine 1 %; 40 mg methylPREDNISolone acetate 40 MG/ML Outcome: tolerated well, no immediate complications Procedure, treatment alternatives, risks and benefits explained, specific risks discussed. Consent was given by the patient. Immediately prior to procedure a  time out was called to verify the correct patient, procedure, equipment, support staff and site/side marked as required. Patient was prepped and draped in the usual sterile fashion.   Large Joint Inj: L knee on 04/18/2019 9:18 AM Indications: diagnostic evaluation and pain Details: 22 G 1.5 in needle, superolateral approach  Arthrogram: No  Medications: 3 mL lidocaine 1 %; 40 mg methylPREDNISolone acetate 40 MG/ML Outcome: tolerated well, no immediate complications Procedure, treatment alternatives, risks and benefits explained, specific risks discussed. Consent was given by the patient. Immediately prior to procedure a time out was called to verify the correct patient, procedure, equipment, support staff and site/side marked as required. Patient was prepped and draped in the usual sterile fashion.       Clinical Data: No additional findings.   Subjective: Chief Complaint  Patient presents with   Right Knee - Pain  The patient comes in today with debilitating bilateral knee pain and known end-stage arthritis in both her knees.  She is 73 years old.  She walks with a walking stick.  It is gotten to where she is tried and failed all forms conservative treatment for over a year now.  She is walking with a significant limp.  She is a fall risk at this point.  She is otherwise a  healthy individual with no active medical problems patient not a diabetic.  Her bilateral knee pain is daily.  It is 10 out of 10.  It is detrimentally affecting her mobility, her quality of life and her activities daily living.  Conservative treatment is included activity modification, walking with an assistive device, weight loss, anti-inflammatories, steroid injections and therapy with quad training exercises.  She is at a point she does wish to proceed with knee replacement surgery.  She understands in light of the coronavirus pandemic we are scheduling surgery several weeks to several months out.  She would like to  have steroid injections in both knees in the interim today with the understanding that she absolutely would like to have knee replacement surgery released her right knee first.  HPI  Review of Systems She currently denies any headache, chest pain, shortness of breath, fever, chills, nausea, vomiting  Objective: Vital Signs: There were no vitals taken for this visit.  Physical Exam She is alert and orient x3 and in no acute distress Ortho Exam She walks with a significant limp.  I am concerned about her being a fall risk.  Both quads are weak.  Both knees have varus malalignment.  Both knees have significant patellofemoral crepitation and severe medial joint line tenderness.  Both knees have mild effusions. Specialty Comments:  No specialty comments available.  Imaging: Xr Knee 1-2 Views Left  Result Date: 04/18/2019 2 views of the left knee shows severe end-stage arthritis.  There is significant varus malalignment.  There is complete loss of the medial joint space.  There are sclerotic changes as well as osteophytes in all 3 compartments.  There is severe patellofemoral disease.  Xr Knee 1-2 Views Right  Result Date: 04/18/2019 2 views of the right knee show severe end-stage arthritic changes.  There is varus malalignment.  There is complete loss of the medial joint space.  There are sclerotic changes as well as osteophytes in all 3 compartments.  There is severe patellofemoral disease.    PMFS History: Patient Active Problem List   Diagnosis Date Noted   Unilateral primary osteoarthritis, left knee 04/18/2019   Unilateral primary osteoarthritis, right knee 04/18/2019   CVA (cerebral vascular accident) ([facility]) 08/04/2018   Stroke ([facility]) 08/04/2018   Chronic back pain 08/04/2018   Post-operative pain 09/20/2017   Postprocedural pseudomeningocele 09/20/2017   Lumbar stenosis with neurogenic claudication 02/04/2015   Stiffness of joints, not elsewhere classified, multiple  sites 10/27/2012   Weakness of both legs 10/27/2012   Difficulty in walking(719.7) 10/27/2012   Past Medical History:  Diagnosis Date   Arthritis    Chronic back pain    Hypertension    no meds in 2 yrs pt took self off   PONV (postoperative nausea and vomiting)     Family History  Problem Relation Age of Onset   Hypertension Mother    Breast cancer Neg Hx    Stroke Neg Hx     Past Surgical History:  Procedure Laterality Date   ABDOMINAL HYSTERECTOMY  37   BACK SURGERY  2010   BACK SURGERY  2015   BACK SURGERY  06/2016   CATARACT EXTRACTION W/PHACO Left 01/07/2017   Procedure: CATARACT EXTRACTION PHACO AND INTRAOCULAR LENS PLACEMENT ([place] [facility]);  Surgeon: Tonny Branch, MD;  Location: [facility];  Service: Ophthalmology;  Laterality: Left;  left CDE 9.31    CATARACT EXTRACTION W/PHACO Right 01/25/2017   Procedure: CATARACT EXTRACTION PHACO AND INTRAOCULAR LENS PLACEMENT ([facility]) CDE:16.07;  Surgeon: Levada Dy  Geoffry Paradise, MD;  Location: [facility];  Service: Ophthalmology;  Laterality: Right;  right   FRACTURE SURGERY Right    arm  1974   LUMBAR WOUND DEBRIDEMENT N/A 09/23/2017   Procedure: Exploration of Lumbar Wound; Repair of Pseudomeningocele;  Surgeon: Newman Pies, MD;  Location: [facility];  Service: Neurosurgery;  Laterality: N/A;   Social History   Occupational History   Not on file  Tobacco Use   Smoking status: Never Smoker   Smokeless tobacco: Never Used  Substance and Sexual Activity   Alcohol use: No   Drug use: No   Sexual activity: Never

## 2019-04-21 ENCOUNTER — Other Ambulatory Visit: Payer: Self-pay | Admitting: Internal Medicine

## 2019-04-21 DIAGNOSIS — Z1231 Encounter for screening mammogram for malignant neoplasm of breast: Secondary | ICD-10-CM

## 2019-04-27 DIAGNOSIS — D509 Iron deficiency anemia, unspecified: Secondary | ICD-10-CM | POA: Diagnosis not present

## 2019-04-27 DIAGNOSIS — E782 Mixed hyperlipidemia: Secondary | ICD-10-CM | POA: Diagnosis not present

## 2019-04-27 DIAGNOSIS — R7301 Impaired fasting glucose: Secondary | ICD-10-CM | POA: Diagnosis not present

## 2019-04-27 DIAGNOSIS — I10 Essential (primary) hypertension: Secondary | ICD-10-CM | POA: Diagnosis not present

## 2019-05-05 IMAGING — CT CT L SPINE W/ CM
1 of 7 series · 5 of 14 positions shown, 7 images · non-contrast
Comparison: MRI 05/29/2016.

CLINICAL DATA: Low back pain. BILATERAL leg pain and weakness,
which has developed following extension of her previous L4-S1 fusion
to include L3-L4.
TECHNIQUE: Contiguous axial images were obtained through the Lumbar spine after
the intrathecal infusion of infusion. Coronal and sagittal
reconstructions were obtained of the axial image sets.

[Series 3: l spine soft · axial · 0.27mm/px · z∈[-262,-106]mm · 5 of 79 slices shown, 7 images]
[im 14/79  soft-tissue]
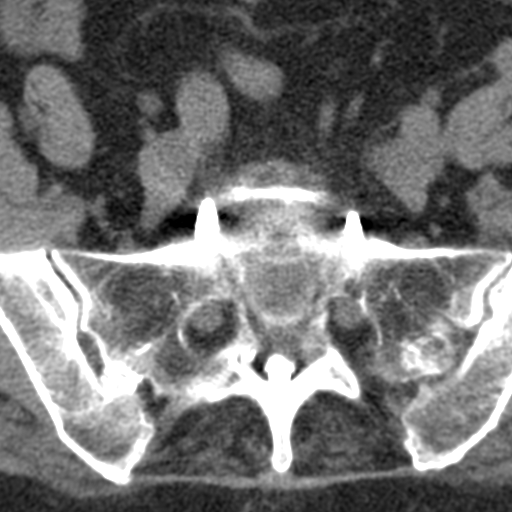
[im 14/79  bone]
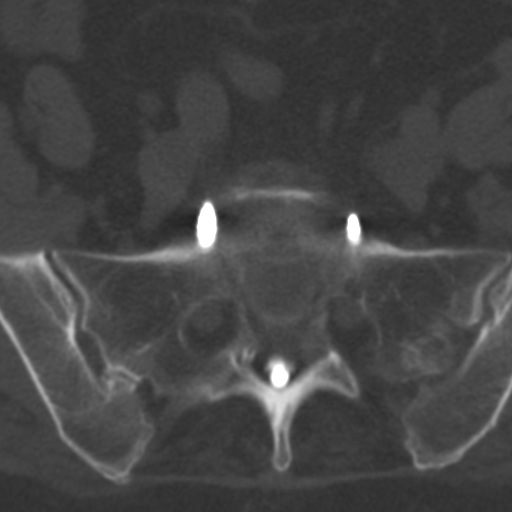
[im 27/79  bone]
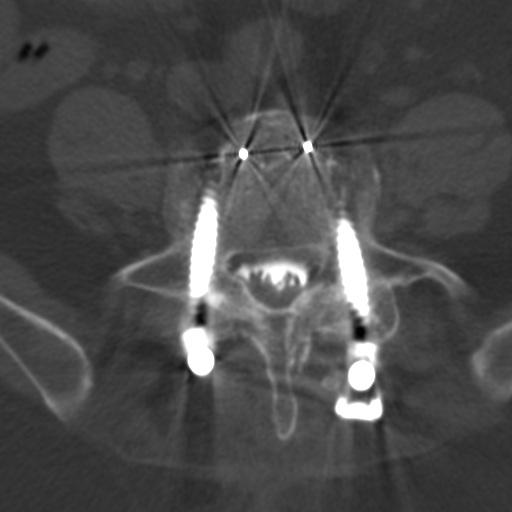
[im 40/79  bone]
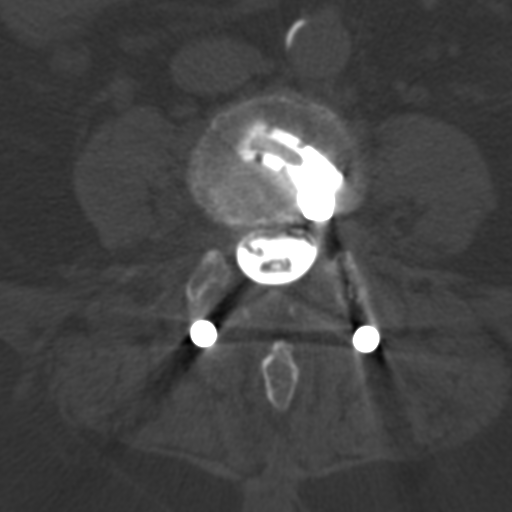
[im 53/79  bone]
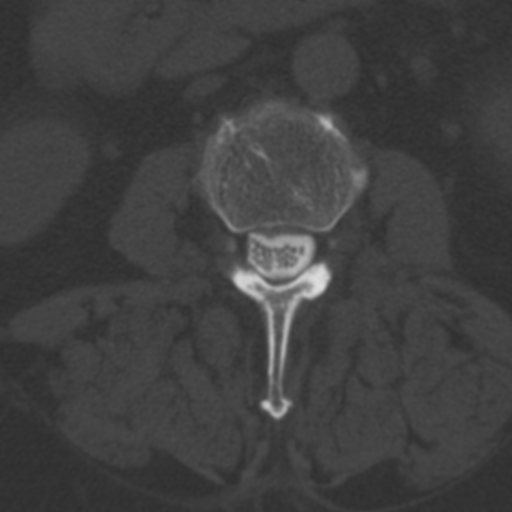
[im 66/79  soft-tissue]
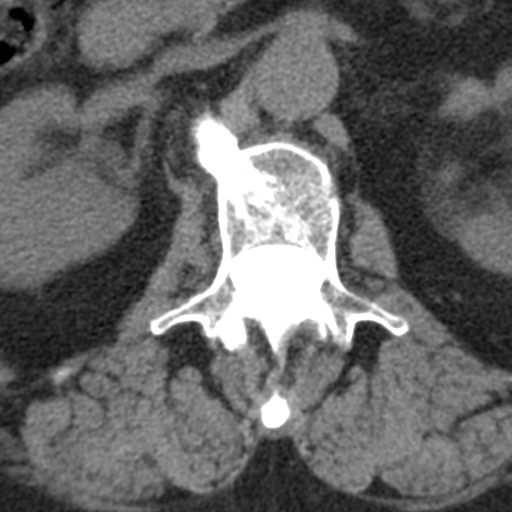
[im 66/79  bone]
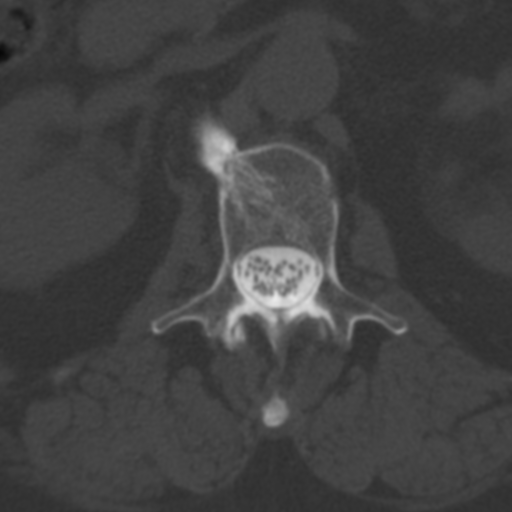

[5 of 14 positions shown; findings below may reference images not displayed]

EXAM:
LUMBAR MYELOGRAM

FLUOROSCOPY TIME:  53 seconds corresponding to a Dose Area Product
of 383.31 ?Gy*m2

PROCEDURE:
After thorough discussion of risks and benefits of the procedure
including bleeding, infection, injury to nerves, blood vessels,
adjacent structures as well as headache and CSF leak, written and
oral informed consent was obtained. Consent was obtained by Dr. Bongu
Ibro Lakatos. Time out form was completed.

Patient was positioned prone on the fluoroscopy table. Local
anesthesia was provided with 1% lidocaine without epinephrine after
prepped and draped in the usual sterile fashion. Puncture was
performed at the L4-5 level using a 3 1/2 inch 20 gauge spinal
needle via midline approach. Using a single pass through the dura,
the needle was placed within the thecal sac, with return of clear
CSF. 15 mL of Isovue M 200 was injected into the thecal sac, with
normal opacification of the nerve roots and cauda equina consistent
with free flow within the subarachnoid space.

I personally performed the lumbar puncture and administered the
intrathecal contrast. I also personally supervised acquisition of
the myelogram images.
FINDINGS: LUMBAR MYELOGRAM FINDINGS:

Good opacification lumbar subarachnoid space. The patient is status
post fusion L3-S1 using pedicle screw construct with interbody
cages. Hardware appears intact and appropriately placed.

Mild thecal sac deformity at L5-S1 and moderate deformity at L4-5,
with nerve root clumping, most consistent with epidural fibrosis.

There is severe stenosis at L2-L3, adjacent segment, above the
fusion. Slight asymmetric loss of interspace height at L2-3 on the
LEFT. Severe BILATERAL L3 nerve root impingement.

Anatomic alignment with patient upright and supine. No dynamic
instability on standing flexion extension views. Stenosis at L2-3 is
most severe in extension, with ligamentum flavum infolding.

CT LUMBAR MYELOGRAM FINDINGS:

Segmentation: Normal.

Alignment:  Normal.

Vertebrae: No worrisome osseous lesion.Solid appearing anterior and
posterior arthrodesis.

Conus medullaris: Normal in size, signal, and location.

Paraspinal tissues: No evidence for hydronephrosis or paravertebral
mass.

Disc levels:

L1-L2:  Unremarkable interspace.

L2-L3: Severe stenosis, related to central protrusion and posterior
element hypertrophy. BILATERAL L3 nerve root impingement due to
subarticular zone narrowing, LEFT greater than RIGHT. BILATERAL
foraminal zone narrowing does not clearly affect the L2 nerve roots.

L3-L4:  Status post TLIF.  Solid arthrodesis.  No impingement.

L4-L5: Status post PLIF. Ligamentum flavum hypertrophy results in
displacement of the thecal sac dorsally on the RIGHT. Epidural
fibrosis contributes to deformity of the thecal sac on the LEFT.
Solid arthrodesis. No definite L4 or L5 nerve root compression.

L5-S1: Status post PLIF. Thecal sac deformity, likely related
epidural fibrosis and some ligamentum flavum infolding. Mild
stenosis. Solid arthrodesis. No S1 or L5 nerve root compression.
IMPRESSION: LUMBAR MYELOGRAM IMPRESSION:

Status post L3 through S1 fusion construct without signs of
pseudarthrosis, dynamic instability, or hardware failure.

Adjacent segment disease at L2-3, related to prominent ventral and
dorsal defects. Severe stenosis with BILATERAL L3 nerve root cut
off.

Thecal sac deformity at L4-5 and L5-S1, likely fibrosis.

CT LUMBAR MYELOGRAM IMPRESSION:

Solid arthrodesis L3-S1.

Severe adjacent segment disease at L2-3, related to central disc
extrusion and posterior element hypertrophy. BILATERAL L3 nerve root
impingement is observed. This is slightly worse on the LEFT.

Epidural fibrosis at L4-5 and L5-S1, with thecal sac deformity
related to both anterior and posterior scarring.

## 2019-05-17 ENCOUNTER — Other Ambulatory Visit: Payer: Self-pay

## 2019-05-22 NOTE — Pre-Procedure Instructions (Signed)
[name]  05/22/2019     [facility] [place], [place] - [address] [place] [address] Phone: [phone] Fax: [place] [place] [place] [address] Phone: [phone] Fax: [phone]   Your procedure is scheduled on Tuesday, July 14th  Report to [facility] Entrance A at 12:50 P.M.  Call this number if you have problems the morning of surgery:  [phone]   Remember:  Do not eat or drink after midnight.    Take these medicines the morning of surgery with A SIP OF WATER  atenolol (TENORMIN  If needed - oxyCODONE-acetaminophen (PERCOCET/ROXICET)  Follow your surgeon's instructions on when to stop Aspirin.  If no instructions were given by your surgeon then you will need to call the office to get those instructions.    7 days prior to surgery STOP taking any Aspirin (unless otherwise instructed by your surgeon), Aleve, Naproxen, Ibuprofen, Motrin, Advil, Goody's, BC's, all herbal medications, fish oil, and all vitamins.  Special instructions:   [facility]- Preparing For Surgery  Before surgery, you can play an important role. Because skin is not sterile, your skin needs to be as free of germs as possible. You can reduce the number of germs on your skin by washing with CHG (chlorahexidine gluconate) Soap before surgery.  CHG is an antiseptic cleaner which kills germs and bonds with the skin to continue killing germs even after washing.    Oral Hygiene is also important to reduce your risk of infection.  Remember - BRUSH YOUR TEETH THE MORNING OF SURGERY WITH YOUR REGULAR TOOTHPASTE  Please do not use if you have an allergy to CHG or antibacterial soaps. If your skin becomes reddened/irritated stop using the CHG.  Do not shave (including legs and underarms) for at least 48 hours prior to first CHG shower. It is OK to shave  your face.  Please follow these instructions carefully.   1. Shower the NIGHT BEFORE SURGERY and the MORNING OF SURGERY with CHG.   2. If you chose to wash your hair, wash your hair first as usual with your normal shampoo.  3. After you shampoo, rinse your hair and body thoroughly to remove the shampoo.  4. Use CHG as you would any other liquid soap. You can apply CHG directly to the skin and wash gently with a scrungie or a clean washcloth.   5. Apply the CHG Soap to your body ONLY FROM THE NECK DOWN.  Do not use on open wounds or open sores. Avoid contact with your eyes, ears, mouth and genitals (private parts). Wash Face and genitals (private parts)  with your normal soap.  6. Wash thoroughly, paying special attention to the area where your surgery will be performed.  7. Thoroughly rinse your body with warm water from the neck down.  8. DO NOT shower/wash with your normal soap after using and rinsing off the CHG Soap.  9. Pat yourself dry with a CLEAN TOWEL.  10. Wear CLEAN PAJAMAS to bed the night before surgery, wear comfortable clothes the morning of surgery  11. Place CLEAN SHEETS on your bed the night of your first shower and DO NOT SLEEP WITH PETS.  Day of Surgery: Do not wear jewelry, make-up or nail polish.  Do not wear lotions, powders, or perfumes, or deodorant.  Do not shave 48 hours prior to  surgery.    Do not bring valuables to the hospital.  [facility] is not responsible for any belongings or valuables.  Please wear clean clothes to the hospital/surgery center.   Remember to brush your teeth WITH YOUR REGULAR TOOTHPASTE.  Contacts, dentures or bridgework may not be worn into surgery.  Leave your suitcase in the car.  After surgery it may be brought to your room.  For patients admitted to the hospital, discharge time will be determined by your treatment team.  Patients discharged the day of surgery will not be allowed to drive home.   Please read over the  following fact sheets that you were given. Pain Booklet, Coughing and Deep Breathing, MRSA Information and Surgical Site Infection Prevention

## 2019-05-23 ENCOUNTER — Encounter (HOSPITAL_COMMUNITY)
Admission: RE | Admit: 2019-05-23 | Discharge: 2019-05-23 | Disposition: A | Discharge status: Home / Self Care | Payer: Medicare Other | Source: Ambulatory Visit | Attending: Orthopaedic Surgery | Admitting: Orthopaedic Surgery

## 2019-05-23 ENCOUNTER — Other Ambulatory Visit: Payer: Self-pay | Admitting: Physician Assistant

## 2019-05-23 ENCOUNTER — Other Ambulatory Visit: Payer: Self-pay

## 2019-05-23 ENCOUNTER — Encounter (HOSPITAL_COMMUNITY): Payer: Self-pay

## 2019-05-23 DIAGNOSIS — I10 Essential (primary) hypertension: Secondary | ICD-10-CM | POA: Diagnosis not present

## 2019-05-23 DIAGNOSIS — Z79899 Other long term (current) drug therapy: Secondary | ICD-10-CM | POA: Diagnosis not present

## 2019-05-23 DIAGNOSIS — Z01818 Encounter for other preprocedural examination: Secondary | ICD-10-CM | POA: Insufficient documentation

## 2019-05-23 DIAGNOSIS — Z8673 Personal history of transient ischemic attack (TIA), and cerebral infarction without residual deficits: Secondary | ICD-10-CM | POA: Insufficient documentation

## 2019-05-23 DIAGNOSIS — M1711 Unilateral primary osteoarthritis, right knee: Secondary | ICD-10-CM | POA: Diagnosis not present

## 2019-05-23 DIAGNOSIS — M549 Dorsalgia, unspecified: Secondary | ICD-10-CM | POA: Insufficient documentation

## 2019-05-23 DIAGNOSIS — G8929 Other chronic pain: Secondary | ICD-10-CM | POA: Diagnosis not present

## 2019-05-23 DIAGNOSIS — Z1159 Encounter for screening for other viral diseases: Secondary | ICD-10-CM | POA: Diagnosis not present

## 2019-05-23 DIAGNOSIS — Z7982 Long term (current) use of aspirin: Secondary | ICD-10-CM | POA: Diagnosis not present

## 2019-05-23 DIAGNOSIS — Z981 Arthrodesis status: Secondary | ICD-10-CM | POA: Diagnosis not present

## 2019-05-23 HISTORY — DX: Cerebral infarction, unspecified: I63.9

## 2019-05-23 LAB — BASIC METABOLIC PANEL
Anion gap: 9 (ref 5–15)
BUN: 20 mg/dL (ref 8–23)
CO2: 23 mmol/L (ref 22–32)
Calcium: 9.1 mg/dL (ref 8.9–10.3)
Chloride: 104 mmol/L (ref 98–111)
Creatinine, Ser: 1 mg/dL (ref 0.44–1.00)
GFR calc Af Amer: >60 mL/min (ref >60)
GFR calc non Af Amer: 56 mL/min — ABNORMAL LOW (ref >60)
Glucose, Bld: 114 mg/dL — ABNORMAL HIGH (ref 70–99)
Potassium: 4.2 mmol/L (ref 3.5–5.1)
Sodium: 136 mmol/L (ref 135–145)

## 2019-05-23 LAB — SURGICAL PCR SCREEN
MRSA, PCR: NEGATIVE
Staphylococcus aureus: NEGATIVE

## 2019-05-23 LAB — CBC
HCT: 35.7 % — ABNORMAL LOW (ref 36.0–46.0)
Hemoglobin: 10.9 g/dL — ABNORMAL LOW (ref 12.0–15.0)
MCH: 27.1 pg (ref 26.0–34.0)
MCHC: 30.5 g/dL (ref 30.0–36.0)
MCV: 88.8 fL (ref 80.0–100.0)
Platelets: 217 10*3/uL (ref 150–400)
RBC: 4.02 MIL/uL (ref 3.87–5.11)
RDW: 14.6 % (ref 11.5–15.5)
WBC: 4.1 10*3/uL (ref 4.0–10.5)
nRBC: 0 % (ref 0.0–0.2)

## 2019-05-23 NOTE — Progress Notes (Signed)
PCP -  Dr. Edwinna Areola. Hall Cardiologist - denies  Chest x-ray - denies EKG - 08/05/18 Stress Test -  ECHO - 08/05/18 Cardiac Cath - denies  Sleep Study - denies CPAP - N/A  Blood Thinner Instructions: N/A Aspirin Instructions: Stop 7 days prior unless otherwise instructed by surgeon. Patient instructed to contact surgeon's office.  Anesthesia review: YES, stroke hx  Coronavirus Screening  Have you experienced the following symptoms:  Cough yes/no: No Fever (>100.17F)  yes/no: No Runny nose yes/no: No Sore throat yes/no: No Difficulty breathing/shortness of breath  yes/no: No  Have you or a family member traveled in the last 14 days and where? yes/no: No  If the patient indicates "YES" to the above questions, their PAT will be rescheduled to limit the exposure to others and, the surgeon will be notified. THE PATIENT WILL NEED TO BE ASYMPTOMATIC FOR 14 DAYS.   If the patient is not experiencing any of these symptoms, the PAT nurse will instruct them to NOT bring anyone with them to their appointment since they may have these symptoms or traveled as well.   Please remind your patients and families that hospital visitation restrictions are in effect and the importance of the restrictions.   Patient denies shortness of breath, fever, cough and chest pain at PAT appointment   Patient verbalized understanding of instructions that were given to them at the PAT appointment. Patient was also instructed that they will need to review over the PAT instructions again at home before surgery.

## 2019-05-24 ENCOUNTER — Encounter (HOSPITAL_COMMUNITY): Payer: Self-pay

## 2019-05-24 NOTE — Progress Notes (Signed)
Anesthesia Chart Review:  Case: [id number] Date/Time: 05/30/19 1436   Procedure: RIGHT TOTAL KNEE ARTHROPLASTY (Right )   Anesthesia type: Choice   Pre-op diagnosis: Osteoarthritis Right Knee   Location: [facility] OR ROOM 04 / [facility]   Surgeon: Mcarthur Rossetti, MD      DISCUSSION: Patient is a 73 year old female scheduled for the above procedure.  History includes never smoker, HTN, CVA (right arm and facial numbness, 08/05/18; seen by Phillips Odor, MD who felt MRI findings likely "remote and incidental" as they did not explain patient's symptoms), chronic back pain with multiple back surgeres (L4-5 microdiskectomy 11/15/08; L4-5 PLIF, L4-5/L5-S1 posterior lateral arthrodesis 02/04/15; L3-4 transforaminal interbody fusion, posterior segmental instrumentation from L3-S1 with globus titanium pedicle screws and rods, posterior lateral arthrodesis L-34 07/09/16; L2-3 transforaminal interbody fusion, posterior lateral arthrodesis 09/13/17, evacuation of epidural hematoma 09/23/17). BMI is consistent with obesity.  Based on currently available information, I would anticipate that she could proceed as planned if no acute changes.  She is scheduled for presurgical COVID testing on 05/26/19.   VS: BP 125/65   Pulse 63   Temp 36.5 C   Resp 20   Ht 5\' 4"  (1.626 m)   Wt 94.3 kg   SpO2 97%   BMI 35.69 kg/m    PROVIDERS: Celene Squibb, MD is PCP   LABS: Labs reviewed: Acceptable for surgery. (all labs ordered are listed, but only abnormal results are displayed)  Labs Reviewed  CBC - Abnormal; Notable for the following components:      Result Value   Hemoglobin 10.9 (*)    HCT 35.7 (*)    All other components within normal limits  BASIC METABOLIC PANEL - Abnormal; Notable for the following components:   Glucose, Bld 114 (*)    GFR calc non Af Amer 56 (*)    All other components within normal limits  SURGICAL PCR SCREEN    IMAGES: CTA head/neck 08/04/18: IMPRESSION: CTA NECK: 1. No  hemodynamically significant stenosis ICA's. Patent vertebral arteries. 2. Severe neural foraminal narrowing C3-4 and C5-6. CTA HEAD: 1. No emergent large vessel occlusion or flow-limiting stenosis. 2. 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 Aortic Atherosclerosis (ICD10-I70.0).  MRI Brain 08/04/18: IMPRESSION: Acute punctate infarction in the right centrum semiovale radiating white matter tracts. No other acute finding. It is not clear how this would relate to the right-sided symptoms described in the history. Chronic small-vessel ischemic changes of a mild degree elsewhere affecting the cerebral hemispheric white matter.   EKG: 08/04/18: Sinus rhythm Low voltage, precordial leads Abnormal R-wave progression, early transition Borderline T abnormalities, anterior leads since last tracing no significant change Confirmed by Daleen Bo [phone]) on 08/04/2018 1:47:12 PM   CV: Echo 08/05/18: Study Conclusions - Left ventricle: The cavity size was normal. Wall thickness was   increased in a pattern of mild LVH. Systolic function was normal.   The estimated ejection fraction was in the range of 55% to 60%.   Wall motion was normal; there were no regional wall motion   abnormalities. Doppler parameters are consistent with abnormal   left ventricular relaxation (grade 1 diastolic dysfunction). - Aortic valve: Mildly calcified annulus. Probably trileaflet;   mildly calcified leaflets. - Mitral valve: There was trivial regurgitation. - Left atrium: The atrium was mildly dilated. - Right atrium: Central venous pressure (est): 3 mm Hg. - Atrial septum: No defect or patent foramen ovale was identified. - Tricuspid valve: There was trivial regurgitation. - Pulmonary arteries: Systolic  pressure could not be accurately   estimated. - Pericardium, extracardiac: There was no pericardial effusion.  Cardiac cath 01/27/11: IMPRESSION: LVEF 60% with no wall motion abnormalities,  No angiographic evidence of any significant obstructive coronary artery (30% ostial D1) explains her chest pain for admission.  I would likely then consider non-ACS/nonanginal chest pain as the source for chest pain.  Consider GI evaluation.   Past Medical History:  Diagnosis Date  . Arthritis   . Chronic back pain   . Hypertension   . Stroke [facility])     Past Surgical History:  Procedure Laterality Date  . ABDOMINAL HYSTERECTOMY  82  . APPENDECTOMY    . BACK SURGERY  2010  . BACK SURGERY  2015  . BACK SURGERY  06/2016  . CATARACT EXTRACTION W/PHACO Left 01/07/2017   Procedure: CATARACT EXTRACTION PHACO AND INTRAOCULAR LENS PLACEMENT ([facility]);  Surgeon: Tonny Branch, MD;  Location: [facility];  Service: Ophthalmology;  Laterality: Left;  left CDE 9.31   . CATARACT EXTRACTION W/PHACO Right 01/25/2017   Procedure: CATARACT EXTRACTION PHACO AND INTRAOCULAR LENS PLACEMENT ([facility]) CDE:16.07;  Surgeon: Tonny Branch, MD;  Location: [facility];  Service: Ophthalmology;  Laterality: Right;  right  . FRACTURE SURGERY Right    arm  1974  . LUMBAR WOUND DEBRIDEMENT N/A 09/23/2017   Procedure: Exploration of Lumbar Wound; Repair of Pseudomeningocele;  Surgeon: Newman Pies, MD;  Location: [facility];  Service: Neurosurgery;  Laterality: N/A;  . TONSILLECTOMY      MEDICATIONS: . aspirin EC 81 MG tablet  . atenolol (TENORMIN) 25 MG tablet  . atorvastatin (LIPITOR) 40 MG tablet  . docusate sodium (COLACE) 100 MG capsule  . losartan-hydrochlorothiazide (HYZAAR) 50-12.5 MG tablet  . oxyCODONE-acetaminophen (PERCOCET/ROXICET) 5-325 MG tablet   No current facility-administered medications for this encounter.   Patient not currently taking Lipitor or Colace. At PAT, patient advised to clarify with Dr. Ninfa Linden, perioperative ASA instructions.   Myra Gianotti, PA-C Surgical Short Stay/Anesthesiology [facility] Phone [phone] [facility] Phone [phone] 05/24/2019 10:36 AM

## 2019-05-24 NOTE — Anesthesia Preprocedure Evaluation (Addendum)
Anesthesia Evaluation  Patient identified by MRN, date of birth, ID band Patient awake    Reviewed: Allergy & Precautions, H&P , NPO status , Patient's Chart, lab work & pertinent test results  Airway Mallampati: II   Neck ROM: full    Dental   Pulmonary neg pulmonary ROS,    breath sounds clear to auscultation       Cardiovascular hypertension,  Rhythm:regular Rate:Normal     Neuro/Psych CVA, Residual Symptoms    GI/Hepatic   Endo/Other  obese  Renal/GU      Musculoskeletal  (+) Arthritis ,   Abdominal   Peds  Hematology   Anesthesia Other Findings   Reproductive/Obstetrics                            Anesthesia Physical Anesthesia Plan  ASA: III  Anesthesia Plan: General   Post-op Pain Management:  Regional for Post-op pain   Induction: Intravenous  PONV Risk Score and Plan: 3 and Ondansetron, Dexamethasone and Treatment may vary due to age or medical condition  Airway Management Planned: Oral ETT  Additional Equipment:   Intra-op Plan:   Post-operative Plan: Extubation in OR  Informed Consent: I have reviewed the patients History and Physical, chart, labs and discussed the procedure including the risks, benefits and alternatives for the proposed anesthesia with the patient or authorized representative who has indicated his/her understanding and acceptance.       Plan Discussed with: CRNA, Anesthesiologist and Surgeon  Anesthesia Plan Comments: (PAT note written 05/24/2019 by Myra Gianotti, PA-C. )       Anesthesia Quick Evaluation

## 2019-05-26 ENCOUNTER — Other Ambulatory Visit (HOSPITAL_COMMUNITY)
Admission: RE | Admit: 2019-05-26 | Discharge: 2019-05-26 | Disposition: A | Discharge status: Home / Self Care | Payer: Medicare Other | Source: Ambulatory Visit | Attending: Orthopaedic Surgery | Admitting: Orthopaedic Surgery

## 2019-05-26 DIAGNOSIS — Z79899 Other long term (current) drug therapy: Secondary | ICD-10-CM | POA: Diagnosis not present

## 2019-05-26 DIAGNOSIS — G8929 Other chronic pain: Secondary | ICD-10-CM | POA: Diagnosis not present

## 2019-05-26 DIAGNOSIS — I10 Essential (primary) hypertension: Secondary | ICD-10-CM | POA: Diagnosis not present

## 2019-05-26 DIAGNOSIS — M1711 Unilateral primary osteoarthritis, right knee: Secondary | ICD-10-CM | POA: Diagnosis not present

## 2019-05-26 DIAGNOSIS — Z01818 Encounter for other preprocedural examination: Secondary | ICD-10-CM | POA: Diagnosis not present

## 2019-05-26 DIAGNOSIS — M549 Dorsalgia, unspecified: Secondary | ICD-10-CM | POA: Diagnosis not present

## 2019-05-26 DIAGNOSIS — Z7982 Long term (current) use of aspirin: Secondary | ICD-10-CM | POA: Diagnosis not present

## 2019-05-26 DIAGNOSIS — Z981 Arthrodesis status: Secondary | ICD-10-CM | POA: Diagnosis not present

## 2019-05-26 DIAGNOSIS — Z8673 Personal history of transient ischemic attack (TIA), and cerebral infarction without residual deficits: Secondary | ICD-10-CM | POA: Diagnosis not present

## 2019-05-26 DIAGNOSIS — Z1159 Encounter for screening for other viral diseases: Secondary | ICD-10-CM | POA: Diagnosis not present

## 2019-05-26 LAB — SARS CORONAVIRUS 2 (TAT 6-24 HRS): SARS Coronavirus 2: NEGATIVE

## 2019-05-30 ENCOUNTER — Encounter (HOSPITAL_COMMUNITY)
Admission: RE | Disposition: A | Discharge status: Home Health | Payer: Self-pay | Source: Home / Self Care | Attending: Orthopaedic Surgery

## 2019-05-30 ENCOUNTER — Other Ambulatory Visit: Payer: Self-pay

## 2019-05-30 ENCOUNTER — Ambulatory Visit (HOSPITAL_COMMUNITY): Payer: Medicare Other | Admitting: Anesthesiology

## 2019-05-30 ENCOUNTER — Inpatient Hospital Stay (HOSPITAL_COMMUNITY)
Admission: RE | Admit: 2019-05-30 | Discharge: 2019-06-02 | DRG: 470 | Disposition: A | Discharge status: Home Health | Payer: Medicare Other | Attending: Orthopaedic Surgery | Admitting: Orthopaedic Surgery

## 2019-05-30 ENCOUNTER — Encounter (HOSPITAL_COMMUNITY): Payer: Self-pay | Admitting: *Deleted

## 2019-05-30 ENCOUNTER — Ambulatory Visit (HOSPITAL_COMMUNITY): Payer: Medicare Other | Admitting: Vascular Surgery

## 2019-05-30 ENCOUNTER — Ambulatory Visit (HOSPITAL_COMMUNITY): Payer: Medicare Other

## 2019-05-30 DIAGNOSIS — Z471 Aftercare following joint replacement surgery: Secondary | ICD-10-CM | POA: Diagnosis not present

## 2019-05-30 DIAGNOSIS — M170 Bilateral primary osteoarthritis of knee: Principal | ICD-10-CM | POA: Diagnosis present

## 2019-05-30 DIAGNOSIS — Z9181 History of falling: Secondary | ICD-10-CM

## 2019-05-30 DIAGNOSIS — E669 Obesity, unspecified: Secondary | ICD-10-CM | POA: Diagnosis present

## 2019-05-30 DIAGNOSIS — Z8673 Personal history of transient ischemic attack (TIA), and cerebral infarction without residual deficits: Secondary | ICD-10-CM

## 2019-05-30 DIAGNOSIS — Z96651 Presence of right artificial knee joint: Secondary | ICD-10-CM

## 2019-05-30 DIAGNOSIS — Z6835 Body mass index (BMI) 35.0-35.9, adult: Secondary | ICD-10-CM

## 2019-05-30 DIAGNOSIS — M549 Dorsalgia, unspecified: Secondary | ICD-10-CM | POA: Diagnosis not present

## 2019-05-30 DIAGNOSIS — M1711 Unilateral primary osteoarthritis, right knee: Secondary | ICD-10-CM | POA: Diagnosis not present

## 2019-05-30 DIAGNOSIS — Z888 Allergy status to other drugs, medicaments and biological substances status: Secondary | ICD-10-CM

## 2019-05-30 DIAGNOSIS — Z96641 Presence of right artificial hip joint: Secondary | ICD-10-CM | POA: Diagnosis not present

## 2019-05-30 DIAGNOSIS — G8929 Other chronic pain: Secondary | ICD-10-CM | POA: Diagnosis present

## 2019-05-30 DIAGNOSIS — I10 Essential (primary) hypertension: Secondary | ICD-10-CM | POA: Diagnosis present

## 2019-05-30 DIAGNOSIS — D62 Acute posthemorrhagic anemia: Secondary | ICD-10-CM | POA: Diagnosis not present

## 2019-05-30 DIAGNOSIS — Z885 Allergy status to narcotic agent status: Secondary | ICD-10-CM | POA: Diagnosis not present

## 2019-05-30 DIAGNOSIS — G8918 Other acute postprocedural pain: Secondary | ICD-10-CM | POA: Diagnosis not present

## 2019-05-30 DIAGNOSIS — I639 Cerebral infarction, unspecified: Secondary | ICD-10-CM | POA: Diagnosis not present

## 2019-05-30 DIAGNOSIS — M25761 Osteophyte, right knee: Secondary | ICD-10-CM | POA: Diagnosis not present

## 2019-05-30 HISTORY — DX: Presence of dental prosthetic device (complete) (partial): Z97.2

## 2019-05-30 HISTORY — PX: TOTAL KNEE ARTHROPLASTY: SHX125

## 2019-05-30 HISTORY — DX: Unspecified cataract: H26.9

## 2019-05-30 SURGERY — ARTHROPLASTY, KNEE, TOTAL
Anesthesia: General | Site: Knee | Laterality: Right

## 2019-05-30 MED ORDER — ATENOLOL 25 MG PO TABS
25.0000 mg | ORAL_TABLET | Freq: Every day | ORAL | Status: DC
  Administered 2019-05-31 – 2019-06-01 (×2): 25 mg via ORAL
  Filled 2019-05-30 (×3): qty 1

## 2019-05-30 MED ORDER — METOCLOPRAMIDE HCL 5 MG/ML IJ SOLN
5.0000 mg | Freq: Three times a day (TID) | INTRAMUSCULAR | Status: DC | PRN
  Administered 2019-05-31: 10 mg via INTRAVENOUS
  Filled 2019-05-30: qty 2

## 2019-05-30 MED ORDER — SODIUM CHLORIDE 0.9 % IV SOLN
INTRAVENOUS | Status: DC
  Administered 2019-05-30: 19:00:00 via INTRAVENOUS

## 2019-05-30 MED ORDER — DOCUSATE SODIUM 100 MG PO CAPS
100.0000 mg | ORAL_CAPSULE | Freq: Two times a day (BID) | ORAL | Status: DC
  Administered 2019-05-30 – 2019-06-02 (×5): 100 mg via ORAL
  Filled 2019-05-30 (×6): qty 1

## 2019-05-30 MED ORDER — CHLORHEXIDINE GLUCONATE 4 % EX LIQD: 60.0000 mL | Freq: Once | CUTANEOUS | Status: DC

## 2019-05-30 MED ORDER — ROPIVACAINE HCL 5 MG/ML IJ SOLN
INTRAMUSCULAR | Status: DC | PRN
  Administered 2019-05-30: 20 mL via PERINEURAL

## 2019-05-30 MED ORDER — METOCLOPRAMIDE HCL 5 MG PO TABS: 5.0000 mg | ORAL_TABLET | Freq: Three times a day (TID) | ORAL | Status: DC | PRN

## 2019-05-30 MED ORDER — PANTOPRAZOLE SODIUM 40 MG PO TBEC
40.0000 mg | DELAYED_RELEASE_TABLET | Freq: Every day | ORAL | Status: DC
  Administered 2019-05-30 – 2019-06-02 (×4): 40 mg via ORAL
  Filled 2019-05-30 (×4): qty 1

## 2019-05-30 MED ORDER — PHENOL 1.4 % MT LIQD: 1.0000 | OROMUCOSAL | Status: DC | PRN

## 2019-05-30 MED ORDER — FENTANYL CITRATE (PF) 100 MCG/2ML IJ SOLN
25.0000 ug | INTRAMUSCULAR | Status: DC | PRN
  Administered 2019-05-30 (×2): 25 ug via INTRAVENOUS
  Administered 2019-05-30: 50 ug via INTRAVENOUS

## 2019-05-30 MED ORDER — OXYCODONE HCL 5 MG/5ML PO SOLN: 5.0000 mg | Freq: Once | ORAL | Status: AC | PRN

## 2019-05-30 MED ORDER — FENTANYL CITRATE (PF) 100 MCG/2ML IJ SOLN
INTRAMUSCULAR | Status: DC | PRN
  Administered 2019-05-30 (×2): 50 ug via INTRAVENOUS
  Administered 2019-05-30: 100 ug via INTRAVENOUS
  Administered 2019-05-30: 50 ug via INTRAVENOUS
  Administered 2019-05-30 (×2): 100 ug via INTRAVENOUS

## 2019-05-30 MED ORDER — FENTANYL CITRATE (PF) 250 MCG/5ML IJ SOLN
INTRAMUSCULAR | Status: AC
  Filled 2019-05-30: qty 5

## 2019-05-30 MED ORDER — HYDROMORPHONE HCL 1 MG/ML IJ SOLN
0.5000 mg | INTRAMUSCULAR | Status: DC | PRN
  Administered 2019-05-30 – 2019-05-31 (×2): 1 mg via INTRAVENOUS
  Filled 2019-05-30 (×2): qty 1

## 2019-05-30 MED ORDER — OXYCODONE HCL 5 MG PO TABS
10.0000 mg | ORAL_TABLET | ORAL | Status: DC | PRN
  Administered 2019-05-30: 15 mg via ORAL
  Administered 2019-05-31: 10 mg via ORAL
  Administered 2019-05-31 (×2): 15 mg via ORAL
  Administered 2019-05-31 – 2019-06-02 (×4): 10 mg via ORAL
  Filled 2019-05-30: qty 2
  Filled 2019-05-30: qty 3
  Filled 2019-05-30 (×2): qty 2
  Filled 2019-05-30 (×2): qty 3

## 2019-05-30 MED ORDER — OXYCODONE HCL 5 MG PO TABS
5.0000 mg | ORAL_TABLET | Freq: Once | ORAL | Status: AC | PRN
  Administered 2019-05-30: 5 mg via ORAL

## 2019-05-30 MED ORDER — PROPOFOL 10 MG/ML IV BOLUS
INTRAVENOUS | Status: DC | PRN
  Administered 2019-05-30: 200 mg via INTRAVENOUS

## 2019-05-30 MED ORDER — TRANEXAMIC ACID-NACL 1000-0.7 MG/100ML-% IV SOLN
INTRAVENOUS | Status: DC | PRN
  Administered 2019-05-30: 1000 mg via INTRAVENOUS

## 2019-05-30 MED ORDER — CEFAZOLIN SODIUM-DEXTROSE 2-4 GM/100ML-% IV SOLN
INTRAVENOUS | Status: AC
  Filled 2019-05-30: qty 100

## 2019-05-30 MED ORDER — ONDANSETRON HCL 4 MG/2ML IJ SOLN
INTRAMUSCULAR | Status: AC
  Filled 2019-05-30: qty 2

## 2019-05-30 MED ORDER — FENTANYL CITRATE (PF) 100 MCG/2ML IJ SOLN
INTRAMUSCULAR | Status: AC
  Administered 2019-05-30: 50 ug via INTRAVENOUS
  Filled 2019-05-30: qty 2

## 2019-05-30 MED ORDER — MIDAZOLAM HCL 2 MG/2ML IJ SOLN
1.0000 mg | Freq: Once | INTRAMUSCULAR | Status: AC
  Administered 2019-05-30: 1 mg via INTRAVENOUS

## 2019-05-30 MED ORDER — ZOLPIDEM TARTRATE 5 MG PO TABS: 5.0000 mg | ORAL_TABLET | Freq: Every evening | ORAL | Status: DC | PRN

## 2019-05-30 MED ORDER — PROPOFOL 10 MG/ML IV BOLUS
INTRAVENOUS | Status: AC
  Filled 2019-05-30: qty 20

## 2019-05-30 MED ORDER — DIPHENHYDRAMINE HCL 12.5 MG/5ML PO ELIX: 12.5000 mg | ORAL_SOLUTION | ORAL | Status: DC | PRN

## 2019-05-30 MED ORDER — ASPIRIN EC 325 MG PO TBEC
325.0000 mg | DELAYED_RELEASE_TABLET | Freq: Two times a day (BID) | ORAL | Status: DC
  Administered 2019-05-30 – 2019-06-02 (×6): 325 mg via ORAL
  Filled 2019-05-30 (×6): qty 1

## 2019-05-30 MED ORDER — ONDANSETRON HCL 4 MG/2ML IJ SOLN: 4.0000 mg | Freq: Four times a day (QID) | INTRAMUSCULAR | Status: DC | PRN

## 2019-05-30 MED ORDER — POVIDONE-IODINE 10 % EX SWAB: 2.0000 "application " | Freq: Once | CUTANEOUS | Status: DC

## 2019-05-30 MED ORDER — CEFAZOLIN SODIUM-DEXTROSE 1-4 GM/50ML-% IV SOLN
1.0000 g | Freq: Four times a day (QID) | INTRAVENOUS | Status: AC
  Administered 2019-05-30 – 2019-05-31 (×2): 1 g via INTRAVENOUS
  Filled 2019-05-30 (×2): qty 50

## 2019-05-30 MED ORDER — 0.9 % SODIUM CHLORIDE (POUR BTL) OPTIME
TOPICAL | Status: DC | PRN
  Administered 2019-05-30: 1000 mL

## 2019-05-30 MED ORDER — LIDOCAINE 2% (20 MG/ML) 5 ML SYRINGE
INTRAMUSCULAR | Status: AC
  Filled 2019-05-30: qty 5

## 2019-05-30 MED ORDER — SODIUM CHLORIDE 0.9 % IR SOLN
Status: DC | PRN
  Administered 2019-05-30: 3000 mL

## 2019-05-30 MED ORDER — OXYCODONE HCL 5 MG PO TABS
5.0000 mg | ORAL_TABLET | ORAL | Status: DC | PRN
  Administered 2019-06-01 (×2): 10 mg via ORAL
  Administered 2019-06-02: 5 mg via ORAL
  Administered 2019-06-02: 10 mg via ORAL
  Filled 2019-05-30: qty 1
  Filled 2019-05-30 (×5): qty 2

## 2019-05-30 MED ORDER — ONDANSETRON HCL 4 MG PO TABS
4.0000 mg | ORAL_TABLET | Freq: Four times a day (QID) | ORAL | Status: DC | PRN
  Filled 2019-05-30: qty 1

## 2019-05-30 MED ORDER — LOSARTAN POTASSIUM-HCTZ 50-12.5 MG PO TABS: 0.5000 | ORAL_TABLET | Freq: Every day | ORAL | Status: DC

## 2019-05-30 MED ORDER — OXYCODONE HCL 5 MG PO TABS
ORAL_TABLET | ORAL | Status: AC
  Administered 2019-05-31: 10 mg via ORAL
  Filled 2019-05-30: qty 1

## 2019-05-30 MED ORDER — POLYETHYLENE GLYCOL 3350 17 G PO PACK: 17.0000 g | PACK | Freq: Every day | ORAL | Status: DC | PRN

## 2019-05-30 MED ORDER — ALUM & MAG HYDROXIDE-SIMETH 200-200-20 MG/5ML PO SUSP: 30.0000 mL | ORAL | Status: DC | PRN

## 2019-05-30 MED ORDER — FENTANYL CITRATE (PF) 100 MCG/2ML IJ SOLN
INTRAMUSCULAR | Status: AC
  Filled 2019-05-30: qty 2

## 2019-05-30 MED ORDER — METHOCARBAMOL 1000 MG/10ML IJ SOLN
500.0000 mg | Freq: Four times a day (QID) | INTRAVENOUS | Status: DC | PRN
  Filled 2019-05-30: qty 5

## 2019-05-30 MED ORDER — HYDROCHLOROTHIAZIDE 10 MG/ML ORAL SUSPENSION
6.2500 mg | Freq: Every day | ORAL | Status: DC
  Administered 2019-05-31: 6.25 mg via ORAL
  Filled 2019-05-30 (×3): qty 1.25

## 2019-05-30 MED ORDER — ONDANSETRON HCL 4 MG/2ML IJ SOLN
INTRAMUSCULAR | Status: DC | PRN
  Administered 2019-05-30: 4 mg via INTRAVENOUS

## 2019-05-30 MED ORDER — CEFAZOLIN SODIUM-DEXTROSE 2-4 GM/100ML-% IV SOLN
2.0000 g | INTRAVENOUS | Status: AC
  Administered 2019-05-30: 2 g via INTRAVENOUS

## 2019-05-30 MED ORDER — ACETAMINOPHEN 325 MG PO TABS: 325.0000 mg | ORAL_TABLET | Freq: Four times a day (QID) | ORAL | Status: DC | PRN

## 2019-05-30 MED ORDER — LACTATED RINGERS IV SOLN
INTRAVENOUS | Status: DC
  Administered 2019-05-30: 1000 mL via INTRAVENOUS
  Administered 2019-05-30: 16:00:00 via INTRAVENOUS

## 2019-05-30 MED ORDER — DEXAMETHASONE SODIUM PHOSPHATE 10 MG/ML IJ SOLN
INTRAMUSCULAR | Status: AC
  Filled 2019-05-30: qty 1

## 2019-05-30 MED ORDER — LOSARTAN POTASSIUM 50 MG PO TABS
25.0000 mg | ORAL_TABLET | Freq: Every day | ORAL | Status: DC
  Administered 2019-05-31 – 2019-06-02 (×2): 25 mg via ORAL
  Filled 2019-05-30 (×2): qty 1

## 2019-05-30 MED ORDER — GABAPENTIN 100 MG PO CAPS: 100.0000 mg | ORAL_CAPSULE | Freq: Three times a day (TID) | ORAL | Status: DC

## 2019-05-30 MED ORDER — METHOCARBAMOL 500 MG PO TABS
ORAL_TABLET | ORAL | Status: AC
  Administered 2019-05-31: 500 mg via ORAL
  Filled 2019-05-30: qty 1

## 2019-05-30 MED ORDER — METHOCARBAMOL 500 MG PO TABS
500.0000 mg | ORAL_TABLET | Freq: Four times a day (QID) | ORAL | Status: DC | PRN
  Administered 2019-05-30 – 2019-06-02 (×6): 500 mg via ORAL
  Filled 2019-05-30 (×5): qty 1

## 2019-05-30 MED ORDER — ONDANSETRON HCL 4 MG/2ML IJ SOLN
4.0000 mg | Freq: Four times a day (QID) | INTRAMUSCULAR | Status: DC | PRN
  Administered 2019-05-31: 4 mg via INTRAVENOUS
  Filled 2019-05-30: qty 2

## 2019-05-30 MED ORDER — FENTANYL CITRATE (PF) 100 MCG/2ML IJ SOLN
50.0000 ug | Freq: Once | INTRAMUSCULAR | Status: AC
  Administered 2019-05-30: 50 ug via INTRAVENOUS

## 2019-05-30 MED ORDER — MENTHOL 3 MG MT LOZG: 1.0000 | LOZENGE | OROMUCOSAL | Status: DC | PRN

## 2019-05-30 MED ORDER — MIDAZOLAM HCL 2 MG/2ML IJ SOLN
INTRAMUSCULAR | Status: AC
  Administered 2019-05-30: 1 mg via INTRAVENOUS
  Filled 2019-05-30: qty 2

## 2019-05-30 MED ORDER — DEXAMETHASONE SODIUM PHOSPHATE 10 MG/ML IJ SOLN
INTRAMUSCULAR | Status: DC | PRN
  Administered 2019-05-30: 5 mg via INTRAVENOUS

## 2019-05-30 MED ORDER — LIDOCAINE 2% (20 MG/ML) 5 ML SYRINGE
INTRAMUSCULAR | Status: DC | PRN
  Administered 2019-05-30: 60 mg via INTRAVENOUS

## 2019-05-30 SURGICAL SUPPLY — 73 items
BANDAGE ACE 6X5 VEL STRL LF (GAUZE/BANDAGES/DRESSINGS) ×4 IMPLANT
BANDAGE ESMARK 6X9 LF (GAUZE/BANDAGES/DRESSINGS) ×1 IMPLANT
BASEPLATE TIBIAL PRIMARY SZ4 (Plate) ×1 IMPLANT
BLADE SAG 18X100X1.27 (BLADE) ×2 IMPLANT
BNDG CMPR 9X6 STRL LF SNTH (GAUZE/BANDAGES/DRESSINGS) ×1
BNDG CMPR MED 10X6 ELC LF (GAUZE/BANDAGES/DRESSINGS) ×1
BNDG ELASTIC 6X10 VLCR STRL LF (GAUZE/BANDAGES/DRESSINGS) ×1 IMPLANT
BNDG ESMARK 6X9 LF (GAUZE/BANDAGES/DRESSINGS) ×2
BOWL SMART MIX CTS (DISPOSABLE) ×2 IMPLANT
BSPLAT TIB 4 CMNT PRM STRL KN (Plate) ×1 IMPLANT
CEMENT BONE SIMPLEX SPEEDSET (Cement) ×2 IMPLANT
COVER SURGICAL LIGHT HANDLE (MISCELLANEOUS) ×2 IMPLANT
COVER WAND RF STERILE (DRAPES) ×2 IMPLANT
CUFF TOURN SGL QUICK 34 (TOURNIQUET CUFF) ×2
CUFF TOURN SGL QUICK 42 (TOURNIQUET CUFF) IMPLANT
CUFF TRNQT CYL 34X4.125X (TOURNIQUET CUFF) ×1 IMPLANT
DRAPE EXTREMITY T 121X128X90 (DISPOSABLE) ×2 IMPLANT
DRAPE HALF SHEET 40X57 (DRAPES) ×2 IMPLANT
DRAPE U-SHAPE 47X51 STRL (DRAPES) ×2 IMPLANT
DRSG PAD ABDOMINAL 8X10 ST (GAUZE/BANDAGES/DRESSINGS) ×3 IMPLANT
DRSG XEROFORM 1X8 (GAUZE/BANDAGES/DRESSINGS) ×1 IMPLANT
DURAPREP 26ML APPLICATOR (WOUND CARE) ×2 IMPLANT
ELECT CAUTERY BLADE 6.4 (BLADE) ×2 IMPLANT
ELECT REM PT RETURN 9FT ADLT (ELECTROSURGICAL) ×2
ELECTRODE REM PT RTRN 9FT ADLT (ELECTROSURGICAL) ×1 IMPLANT
FACESHIELD WRAPAROUND (MASK) ×4 IMPLANT
FACESHIELD WRAPAROUND OR TEAM (MASK) ×2 IMPLANT
FEMORAL PEG DISTAL FIXATION (Orthopedic Implant) ×1 IMPLANT
FEMORAL POST STABILIZED NO 3 (Orthopedic Implant) ×1 IMPLANT
GAUZE SPONGE 4X4 12PLY STRL (GAUZE/BANDAGES/DRESSINGS) ×2 IMPLANT
GAUZE XEROFORM 1X8 LF (GAUZE/BANDAGES/DRESSINGS) ×2 IMPLANT
GLOVE BIOGEL PI IND STRL 8 (GLOVE) ×2 IMPLANT
GLOVE BIOGEL PI INDICATOR 8 (GLOVE) ×2
GLOVE ORTHO TXT STRL SZ7.5 (GLOVE) ×2 IMPLANT
GLOVE SURG ORTHO 8.0 STRL STRW (GLOVE) ×2 IMPLANT
GOWN STRL REUS W/ TWL LRG LVL3 (GOWN DISPOSABLE) IMPLANT
GOWN STRL REUS W/ TWL XL LVL3 (GOWN DISPOSABLE) ×2 IMPLANT
GOWN STRL REUS W/TWL LRG LVL3 (GOWN DISPOSABLE)
GOWN STRL REUS W/TWL XL LVL3 (GOWN DISPOSABLE) ×6
HANDPIECE INTERPULSE COAX TIP (DISPOSABLE) ×2
IMMOBILIZER KNEE 22 UNIV (SOFTGOODS) ×2 IMPLANT
INSERT TIB BEARING SZ 4 12 (Insert) ×1 IMPLANT
KIT BASIN OR (CUSTOM PROCEDURE TRAY) ×2 IMPLANT
KIT TURNOVER KIT B (KITS) ×2 IMPLANT
MANIFOLD NEPTUNE II (INSTRUMENTS) ×2 IMPLANT
NDL SAFETY ECLIPSE 18X1.5 (NEEDLE) IMPLANT
NEEDLE HYPO 18GX1.5 SHARP (NEEDLE)
NS IRRIG 1000ML POUR BTL (IV SOLUTION) ×2 IMPLANT
PACK TOTAL JOINT (CUSTOM PROCEDURE TRAY) ×2 IMPLANT
PAD ARMBOARD 7.5X6 YLW CONV (MISCELLANEOUS) ×2 IMPLANT
PAD CAST 4YDX4 CTTN HI CHSV (CAST SUPPLIES) IMPLANT
PADDING CAST COTTON 4X4 STRL (CAST SUPPLIES) ×2
PADDING CAST COTTON 6X4 STRL (CAST SUPPLIES) ×2 IMPLANT
PATELLA TRIATHLON SZ 29 9 MM (Orthopedic Implant) ×1 IMPLANT
PIN FLUTED HEDLESS FIX 3.5X1/8 (PIN) ×1 IMPLANT
SET HNDPC FAN SPRY TIP SCT (DISPOSABLE) ×1 IMPLANT
SET PAD KNEE POSITIONER (MISCELLANEOUS) ×2 IMPLANT
STAPLER VISISTAT 35W (STAPLE) ×1 IMPLANT
STRIP CLOSURE SKIN 1/2X4 (GAUZE/BANDAGES/DRESSINGS) IMPLANT
SUCTION FRAZIER HANDLE 10FR (MISCELLANEOUS) ×1
SUCTION TUBE FRAZIER 10FR DISP (MISCELLANEOUS) ×1 IMPLANT
SUT MNCRL AB 4-0 PS2 18 (SUTURE) IMPLANT
SUT VIC AB 0 CT1 27 (SUTURE) ×4
SUT VIC AB 0 CT1 27XBRD ANBCTR (SUTURE) ×1 IMPLANT
SUT VIC AB 1 CT1 27 (SUTURE) ×6
SUT VIC AB 1 CT1 27XBRD ANBCTR (SUTURE) ×2 IMPLANT
SUT VIC AB 2-0 CT1 27 (SUTURE) ×4
SUT VIC AB 2-0 CT1 TAPERPNT 27 (SUTURE) ×2 IMPLANT
SYR 50ML LL SCALE MARK (SYRINGE) IMPLANT
TOWEL GREEN STERILE (TOWEL DISPOSABLE) ×2 IMPLANT
TOWEL GREEN STERILE FF (TOWEL DISPOSABLE) ×2 IMPLANT
TRAY CATH 16FR W/PLASTIC CATH (SET/KITS/TRAYS/PACK) IMPLANT
WRAP KNEE MAXI GEL POST OP (GAUZE/BANDAGES/DRESSINGS) ×2 IMPLANT

## 2019-05-30 NOTE — Plan of Care (Signed)

## 2019-05-30 NOTE — Anesthesia Procedure Notes (Signed)
Anesthesia Regional Block: Adductor canal block   Pre-Anesthetic Checklist: ,, timeout performed, Correct Patient, Correct Site, Correct Laterality, Correct Procedure, Correct Position, site marked, Risks and benefits discussed,  Surgical consent,  Pre-op evaluation,  At surgeon's request and post-op pain management  Laterality: Right  Prep: chloraprep       Needles:  Injection technique: Single-shot  Needle Type: Echogenic Needle     Needle Length: 9cm  Needle Gauge: 21     Additional Needles:   Narrative:  Start time: 05/30/2019 2:50 PM End time: 05/30/2019 2:57 PM Injection made incrementally with aspirations every 5 mL.  Performed by: Personally  Anesthesiologist: Albertha Ghee, MD  Additional Notes: Pt tolerated the procedure well.

## 2019-05-30 NOTE — H&P (Signed)
TOTAL KNEE ADMISSION H&P  Patient is being admitted for right total knee arthroplasty.  Subjective:  Chief Complaint:right knee pain.  HPI: [name], 73 y.o. female, has a history of pain and functional disability in the right knee due to arthritis and has failed non-surgical conservative treatments for greater than 12 weeks to includeNSAID's and/or analgesics, corticosteriod injections, viscosupplementation injections, flexibility and strengthening excercises, use of assistive devices, weight reduction as appropriate and activity modification.  Onset of symptoms was gradual, starting 5 years ago with rapidlly worsening course since that time. The patient noted no past surgery on the right knee(s).  Patient currently rates pain in the right knee(s) at 10 out of 10 with activity. Patient has night pain, worsening of pain with activity and weight bearing, pain that interferes with activities of daily living, pain with passive range of motion, crepitus and joint swelling.  Patient has evidence of subchondral sclerosis, periarticular osteophytes and joint space narrowing by imaging studies. There is no active infection.  Patient Active Problem List   Diagnosis Date Noted  . Unilateral primary osteoarthritis, left knee 04/18/2019  . Unilateral primary osteoarthritis, right knee 04/18/2019  . CVA (cerebral vascular accident) ([place]) 08/04/2018  . Stroke ([place]) 08/04/2018  . Chronic back pain 08/04/2018  . Post-operative pain 09/20/2017  . 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 Lumbar stenosis with neurogenic claudication 02/04/2015  . Stiffness of joints, not elsewhere classified, multiple sites 10/27/2012  . Weakness of both legs 10/27/2012  . Difficulty in walking(719.7) 10/27/2012   Past Medical History:  Diagnosis Date  . Arthritis   . Cataracts, bilateral    surgery to remove cataracts - bilateral  . Chronic back pain   . Hypertension   . Stroke ([place])   . Wears dentures      Past Surgical History:  Procedure Laterality Date  . ABDOMINAL HYSTERECTOMY  82  . APPENDECTOMY    . BACK SURGERY  2010  . BACK SURGERY  2015  . BACK SURGERY  06/2016  . CATARACT EXTRACTION W/PHACO Left 01/07/2017   Procedure: CATARACT EXTRACTION PHACO AND INTRAOCULAR LENS PLACEMENT (I[facility]);  Surgeon: Tonny Branch, MD;  Location: [facility];  Service: Ophthalmology;  Laterality: Left;  left CDE 9.31   . CATARACT EXTRACTION W/PHACO Right 01/25/2017   Procedure: CATARACT EXTRACTION PHACO AND INTRAOCULAR LENS PLACEMENT ([place]) CDE:16.07;  Surgeon: Tonny Branch, MD;  Location: [facility];  Service: Ophthalmology;  Laterality: Right;  right  . FRACTURE SURGERY Right    arm  1974  . LUMBAR WOUND DEBRIDEMENT N/A 09/23/2017   Procedure: Exploration of Lumbar Wound; Repair of Pseudomeningocele;  Surgeon: Newman Pies, MD;  Location: [facility];  Service: Neurosurgery;  Laterality: N/A;  . TONSILLECTOMY      Current Facility-Administered Medications  Medication Dose Route Frequency Provider Last Rate Last Dose  . ceFAZolin (ANCEF) 2-4 GM/100ML-% IVPB           . [START ON 05/31/2019] ceFAZolin (ANCEF) IVPB 2g/100 mL premix  2 g Intravenous On Call to OR Mcarthur Rossetti, MD      . chlorhexidine (HIBICLENS) 4 % liquid 4 application  60 mL Topical Once Pete Pelt, PA-C      . lactated ringers infusion   Intravenous Continuous Roderic Palau, MD 10 mL/hr at 05/30/19 1340 1,000 mL at 05/30/19 1340  . povidone-iodine 10 % swab 2 application  2 application Topical Once Pete Pelt, PA-C       Allergies  Allergen Reactions  . Flexeril [Cyclobenzaprine]  Nausea Only  . Hydrocodone Nausea Only  . Lyrica [Pregabalin] Palpitations  . Neurontin [Gabapentin] Palpitations    Social History   Tobacco Use  . Smoking status: Never Smoker  . Smokeless tobacco: Never Used  Substance Use Topics  . Alcohol use: No    Family History  Problem Relation Age of Onset  . Hypertension Mother   . Breast  cancer Neg Hx   . Stroke Neg Hx      Review of Systems  Musculoskeletal: Positive for joint pain.  All other systems reviewed and are negative.   Objective:  Physical Exam  Constitutional: She is oriented to person, place, and time. She appears well-developed and well-nourished.  HENT:  Head: Normocephalic and atraumatic.  Eyes: Pupils are equal, round, and reactive to light.  Neck: Normal range of motion.  Cardiovascular: Normal rate.  Respiratory: Effort normal.  GI: Soft.  Musculoskeletal:     Right knee: She exhibits decreased range of motion, swelling, effusion, abnormal alignment and bony tenderness. Tenderness found. Medial joint line and lateral joint line tenderness noted.  Neurological: She is alert and oriented to person, place, and time.  Skin: Skin is warm and dry.  Psychiatric: She has a normal mood and affect.    Vital signs in last 24 hours: Temp:  [98.1 F (36.7 C)] 98.1 F (36.7 C) (07/14 1255) Pulse Rate:  [58-92] 58 (07/14 1410) Resp:  [16-22] 18 (07/14 1410) BP: (137-168)/(58-69) 155/65 (07/14 1410) SpO2:  [97 %-100 %] 100 % (07/14 1410) Weight:  [94.3 kg] 94.3 kg (07/14 1255)  Labs:   Estimated body mass index is 35.69 kg/m as calculated from the following:   Height as of this encounter: 5\' 4"  (1.626 m).   Weight as of this encounter: 94.3 kg.   Imaging Review Plain radiographs demonstrate severe degenerative joint disease of the right knee(s). The overall alignment ismild varus. The bone quality appears to be good for age and reported activity level.      Assessment/Plan:  End stage arthritis, right knee   The patient history, physical examination, clinical judgment of the provider and imaging studies are consistent with end stage degenerative joint disease of the right knee(s) and total knee arthroplasty is deemed medically necessary. The treatment options including medical management, injection therapy arthroscopy and arthroplasty were  discussed at length. The risks and benefits of total knee arthroplasty were presented and reviewed. The risks due to aseptic loosening, infection, stiffness, patella tracking problems, thromboembolic complications and other imponderables were discussed. The patient acknowledged the explanation, agreed to proceed with the plan and consent was signed. Patient is being admitted for inpatient treatment for surgery, pain control, PT, OT, prophylactic antibiotics, VTE prophylaxis, progressive ambulation and ADL's and discharge planning. The patient is planning to be discharged home with home health services    Anticipated LOS equal to or greater than 2 midnights due to - Age 33 and older with one or more of the following:  - Obesity  - Expected need for hospital services (PT, OT, Nursing) required for safe  discharge  - Anticipated need for postoperative skilled nursing care or inpatient rehab  - Active co-morbidities: None OR   - Unanticipated findings during/Post Surgery: None  - Patient is a high risk of re-admission due to: None

## 2019-05-30 NOTE — Op Note (Signed)
NAME: [name], [name] MEDICAL RECORD [id number] ACCOUNT [id number] DATE OF BIRTH:1946/10/21 FACILITY: [facility] LOCATION: [facility] PHYSICIAN:Lacara Dunsworth Kerry Fort, MD  OPERATIVE REPORT  DATE OF PROCEDURE:  05/30/2019  PREOPERATIVE DIAGNOSIS:  Primary osteoarthritis and degenerative joint disease, right knee.  POSTOPERATIVE DIAGNOSIS:  Primary osteoarthritis and degenerative joint disease, right knee.  PROCEDURE:  Right total knee arthroplasty.  IMPLANTS:  Stryker Triathlon cemented knee system with size 3 femur, size 4 tibial tray, 12 mm fixed bearing polyethylene insert, size 29 patellar button.  SURGEON:  Lind Guest. Ninfa Linden, MD  ASSISTANT:  Erskine Emery, PA-C  ANESTHESIA: 1.  Right lower extremity adductor canal block. 2.  General.  ESTIMATED BLOOD LOSS:  Less than 200 mL.  TOURNIQUET TIME:  Less than 1 hour.  ANTIBIOTICS:  Two grams IV Ancef.  COMPLICATIONS:  None.  INDICATIONS:  The patient is a very pleasant 73 year old female with debilitating arthritis involving both her knees.  Her x-rays showed complete loss of joint space throughout either knee.  The right seems to be worse than left for her clinically.  Both  knees have varus malalignment and severe bone-on-bone arthritis.  At this point, her right knee pain has detrimentally affected her mobility, her quality of life and her activities of daily living to the point she does wish to proceed with total knee  arthroplasty.  We talked in detail about the risk of acute blood loss anemia, nerve or vessel injury, fracture, infection, DVT, and implant failure.  We talked about our goals of being decreased pain, improve mobility and overall improved quality of  life.  DESCRIPTION OF PROCEDURE:  After informed consent was obtained and appropriate right knee was marked, an adductor canal block was obtained in the holding room.  She was then brought to the operating room and placed supine on the operating table.   General  anesthesia was then obtained.  A nonsterile tourniquet was placed around her upper right thigh and her right thigh, knee, leg, ankle and foot were prepped and draped with DuraPrep and sterile drapes including a sterile stockinette with the bed raised.   A time-out was called to identify correct patient, correct right knee.  With the use Esmarch to wrap that leg and tourniquet was inflated to 300 mm of pressure.  We then with the knee extended made a midline incision over the patella and carried this  proximally and distally.  We dissected down the knee joint and carried out a medial parapatellar arthrotomy and found a large joint effusion and significant arthritis throughout her right knee.  With the knee in a flexed position, we removed remnants of  ACL, PCL, medial and lateral meniscus.  Using extramedullary cutting guide for making our proximal tibia cut we set this for a neutral slope correcting for varus and valgus and taking 9 mm off the high side.  We made this cut without difficulty.  We then  used an intramedullary drill for our femoral alignment guide for distal femoral cut setting this for a right knee at 5 degrees externally rotated for a 10 mm distal femoral cut.  We made this cut without difficulty.  We brought the knee back down to  full extension and with a 9 mm extension block she actually slightly hyperextended.  We then removed more remnants of the medial and lateral meniscus from the back of the knee and then flexed the knee again.  We used our femoral sizing guide based off  the epicondylar axis and [place] line and chose  a size 3 femur based off of this.  We put our 4-in-1 cutting block for size 3 femur, made our anterior and posterior cuts, followed by our chamfer cuts.  We then made our femoral box cut for a size 3  femur.  Attention was then turned back to the tibia.  We chose a size 4 tibial tray for coverage setting rotation off the femur and the tibial tubercle.  We  made the keel punch off of this without difficulty.  With a trial size 4 tibia followed by the  right 3 femur trial, we placed a 9 mm trial insert and we knew that we would need to go to a thicker insert.  We then made our patellar cut and drilled 3 holes for a size 29 patellar button.  We then removed all instrumentation from the knee and  irrigated the knee with normal saline solution using pulsatile lavage.  We then mixed our cement and cemented the real Stryker Triathlon tibial tray size 4 followed by the real size 3 right femur.  We placed a 12 mm fixed bearing polyethylene insert and  cemented our patellar button.  We were pleased with range of motion and stability after this and once the cement hardened, we let the tourniquet down and hemostasis was obtained with electrocautery.  We then closed the arthrotomy with interrupted #1  Vicryl suture followed by 0 Vicryl to close the deep tissue, 2-0 Vicryl was used to close the subcutaneous tissue and interrupted staples were placed on the skin.  A well-padded sterile dressing was applied.  She was awakened, extubated, and taken to  recovery room in stable condition.  All final counts were correct.  There were no complications noted.  Of note, Benita Stabile, PA-C, assisted during the entire case and his assistance was crucial for facilitating all aspects of this case.  TN/NUANCE  D:05/30/2019 T:05/30/2019 JOB:[id number]

## 2019-05-30 NOTE — Anesthesia Procedure Notes (Signed)
Procedure Name: LMA Insertion Date/Time: 05/30/2019 2:53 PM Performed by: Moshe Salisbury, CRNA Pre-anesthesia Checklist: Patient identified, Emergency Drugs available, Suction available and Patient being monitored Patient Re-evaluated:Patient Re-evaluated prior to induction Oxygen Delivery Method: Circle System Utilized Preoxygenation: Pre-oxygenation with 100% oxygen Induction Type: IV induction Ventilation: Mask ventilation without difficulty LMA: LMA inserted LMA Size: 4.0 Number of attempts: 1 Placement Confirmation: positive ETCO2 Tube secured with: Tape Dental Injury: Teeth and Oropharynx as per pre-operative assessment

## 2019-05-30 NOTE — Progress Notes (Signed)
Orthopedic Tech Progress Note Patient Details:  [name] Nov 30, 1945 [id number]  CPM Right Knee CPM Right Knee: On Right Knee Flexion (Degrees): 90 Right Knee Extension (Degrees): 0  Post Interventions Patient Tolerated: Well Instructions Provided: Care of device, Adjustment of device  [name] 05/30/2019, 5:48 PM

## 2019-05-30 NOTE — Transfer of Care (Signed)
Immediate Anesthesia Transfer of Care Note  Patient: [name]  Procedure(s) Performed: RIGHT TOTAL KNEE ARTHROPLASTY (Right Knee)  Patient Location: PACU  Anesthesia Type:GA combined with regional for post-op pain  Level of Consciousness: awake and patient cooperative  Airway & Oxygen Therapy: Patient Spontanous Breathing and Patient connected to nasal cannula oxygen  Post-op Assessment: Report given to RN, Post -op Vital signs reviewed and stable and Patient moving all extremities  Post vital signs: Reviewed and stable  Last Vitals:  Vitals Value Taken Time  BP 157/73 05/30/19 1655  Temp    Pulse 91 05/30/19 1656  Resp 9 05/30/19 1656  SpO2 100 % 05/30/19 1656  Vitals shown include unvalidated device data.  Last Pain:  Vitals:   05/30/19 1410  TempSrc:   PainSc: 0-No pain      Patients Stated Pain Goal: 3 (90/30/09 2330)  Complications: No apparent anesthesia complications

## 2019-05-30 NOTE — Brief Op Note (Signed)
05/30/2019  4:27 PM  PATIENT:  [name]  73 y.o. female  PRE-OPERATIVE DIAGNOSIS:  Osteoarthritis Right Knee  POST-OPERATIVE DIAGNOSIS:  Osteoarthritis Right Knee  PROCEDURE:  Procedure(s): RIGHT TOTAL KNEE ARTHROPLASTY (Right)  SURGEON:  Surgeon(s) and Role:    Mcarthur Rossetti, MD - Primary  PHYSICIAN ASSISTANT: Benita Stabile, PA-C  ANESTHESIA:   regional and general  EBL:  150 mL   COUNTS:  YES  TOURNIQUET:   Total Tourniquet Time Documented: Thigh (Right) - 56 minutes Total: Thigh (Right) - 56 minutes   DICTATION: .Other Dictation: Dictation Number [id number]  PLAN OF CARE: Admit to inpatient   PATIENT DISPOSITION:  PACU - hemodynamically stable.   Delay start of Pharmacological VTE agent (>24hrs) due to surgical blood loss or risk of bleeding: no

## 2019-05-31 ENCOUNTER — Encounter (HOSPITAL_COMMUNITY): Payer: Self-pay | Admitting: Orthopaedic Surgery

## 2019-05-31 LAB — BASIC METABOLIC PANEL
Anion gap: 10 (ref 5–15)
BUN: 16 mg/dL (ref 8–23)
CO2: 22 mmol/L (ref 22–32)
Calcium: 8.5 mg/dL — ABNORMAL LOW (ref 8.9–10.3)
Chloride: 100 mmol/L (ref 98–111)
Creatinine, Ser: 1.08 mg/dL — ABNORMAL HIGH (ref 0.44–1.00)
GFR calc Af Amer: 59 mL/min — ABNORMAL LOW (ref >60)
GFR calc non Af Amer: 51 mL/min — ABNORMAL LOW (ref >60)
Glucose, Bld: 141 mg/dL — ABNORMAL HIGH (ref 70–99)
Potassium: 4.4 mmol/L (ref 3.5–5.1)
Sodium: 132 mmol/L — ABNORMAL LOW (ref 135–145)

## 2019-05-31 LAB — CBC
HCT: 30.4 % — ABNORMAL LOW (ref 36.0–46.0)
Hemoglobin: 9.6 g/dL — ABNORMAL LOW (ref 12.0–15.0)
MCH: 27.2 pg (ref 26.0–34.0)
MCHC: 31.6 g/dL (ref 30.0–36.0)
MCV: 86.1 fL (ref 80.0–100.0)
Platelets: 191 10*3/uL (ref 150–400)
RBC: 3.53 MIL/uL — ABNORMAL LOW (ref 3.87–5.11)
RDW: 14.6 % (ref 11.5–15.5)
WBC: 7 10*3/uL (ref 4.0–10.5)
nRBC: 0.3 % — ABNORMAL HIGH (ref 0.0–0.2)

## 2019-05-31 NOTE — Anesthesia Postprocedure Evaluation (Signed)
Anesthesia Post Note  Patient: [name]  Procedure(s) Performed: RIGHT TOTAL KNEE ARTHROPLASTY (Right Knee)     Patient location during evaluation: PACU Anesthesia Type: General and Regional Level of consciousness: awake and alert Pain management: pain level controlled Vital Signs Assessment: post-procedure vital signs reviewed and stable Respiratory status: spontaneous breathing, nonlabored ventilation, respiratory function stable and patient connected to nasal cannula oxygen Cardiovascular status: blood pressure returned to baseline and stable Postop Assessment: no apparent nausea or vomiting Anesthetic complications: no    Last Vitals:  Vitals:   05/31/19 0854 05/31/19 0857  BP: (!) 106/47 (!) 106/47  Pulse: 84 84  Resp: 17   Temp: 37.4 C   SpO2: 94%     Last Pain:  Vitals:   05/31/19 0854  TempSrc: Oral  PainSc:                  [place] S

## 2019-05-31 NOTE — Progress Notes (Signed)
Subjective: 1 Day Post-Op Procedure(s) (LRB): RIGHT TOTAL KNEE ARTHROPLASTY (Right) Patient reports pain as moderate.  Acute on chronic blood loss anemia.  Has been in CPM.  Has not been up at all otherwise.  Objective: Vital signs in last 24 hours: Temp:  [97 F (36.1 C)-99.4 F (37.4 C)] 99.4 F (37.4 C) (07/15 0425) Pulse Rate:  [58-93] 76 (07/15 0425) Resp:  [11-22] 18 (07/15 0425) BP: (100-169)/(58-105) 100/85 (07/15 0425) SpO2:  [91 %-100 %] 93 % (07/15 0425) Weight:  [94.3 kg] 94.3 kg (07/14 1255)  Intake/Output from previous day: 07/14 0701 - 07/15 0700 In: 1000.5 [I.V.:1000.5] Out: 150 [Blood:150] Intake/Output this shift: No intake/output data recorded.  Recent Labs    05/31/19 0332  HGB 9.6*   Recent Labs    05/31/19 0332  WBC 7.0  RBC 3.53*  HCT 30.4*  PLT 191   Recent Labs    05/31/19 0332  NA 132*  K 4.4  CL 100  CO2 22  BUN 16  CREATININE 1.08*  GLUCOSE 141*  CALCIUM 8.5*   No results for input(s): LABPT, INR in the last 72 hours.  Sensation intact distally Intact pulses distally Dorsiflexion/Plantar flexion intact Incision: dressing C/D/I Compartment soft   Assessment/Plan: 1 Day Post-Op Procedure(s) (LRB): RIGHT TOTAL KNEE ARTHROPLASTY (Right) Up with therapy Plan for discharge tomorrow Discharge home with home health   Given her age and significant fall risk, combined with her acute blood loss anemia and slightly low BP, I will not be comfortable sending her home today at all.  She needs guided PT as well as continued hydration and monitoring her  Vitals throughout the day.  She will also need a repeat CBC in the am tomorrow to closely monitor her Hgb/Hct      Mcarthur Rossetti 05/31/2019, 8:03 AM

## 2019-05-31 NOTE — Evaluation (Signed)
Physical Therapy Evaluation Patient Details Name: [name] MRN: [id number] DOB: September 06, 1946 Today's Date: 05/31/2019   History of Present Illness  Pt is a 73 y.o. female s/p elective R TKA on 05/30/19. PMH includes multiple back sxs, stroke, HTN, arthritis.  Clinical Impression  Pt presents with an overall decrease in functional mobility secondary to above. PTA, pt independent, but recently ambulating with RW due to increased knee pain; lives with supportive husband, both remain active. Educ on precautions, positioning, therex, and importance of mobility. Today, pt requiring modA to stand and take pivotal steps to recliner; pt limited by R knee pain, difficulty taking complete step with RLE. Pt reports husband will be able to provide physical assist as needed. Pt would benefit from continued acute PT services to maximize functional mobility and independence prior to d/c with HHPT services.     Follow Up Recommendations Follow surgeon's recommendation for DC plan and follow-up therapies;Supervision for mobility/OOB    Equipment Recommendations  Rolling walker with 5" wheels    Recommendations for Other Services       Precautions / Restrictions Precautions Precautions: Knee;Fall Precaution Comments: Verbally reviewed precautions/positioning Restrictions Weight Bearing Restrictions: Yes RLE Weight Bearing: Weight bearing as tolerated      Mobility  Bed Mobility Overal bed mobility: Needs Assistance Bed Mobility: Supine to Sit     Supine to sit: Min assist     General bed mobility comments: MinA to initiate RLE movement; minA for HHA to elevate trunk  Transfers Overall transfer level: Needs assistance Equipment used: Rolling walker (2 wheeled) Transfers: Sit to/from Stand Sit to Stand: Mod assist         General transfer comment: ModA to assist trunk elevation standing from elevated EOB and again from lower recliner surface; heavy reliance on UE  support  Ambulation/Gait Ambulation/Gait assistance: Min assist Gait Distance (Feet): 2 Feet     Gait velocity: Decreased   General Gait Details: Pt able to take pivotal steps from bed to recliner with minA for balance; difficulty taking complete step with RLE, so pt scooting it on floor. Heavy reliance on UE support; limited by pain  Stairs            Wheelchair Mobility    Modified Rankin (Stroke Patients Only)       Balance Overall balance assessment: Needs assistance   Sitting balance-Leahy Scale: Fair       Standing balance-Leahy Scale: Poor Standing balance comment: Reliant on UE support                             Pertinent Vitals/Pain Pain Assessment: 0-10 Pain Score: 6  Pain Location: R knee Pain Descriptors / Indicators: Sharp;Shooting;Guarding;Grimacing Pain Intervention(s): Monitored during session;RN gave pain meds during session    [place] expects to be discharged to:: Private residence Living Arrangements: Spouse/significant other Available Help at Discharge: Family;Available 24 hours/day Type of Home: House Home Access: Stairs to enter Entrance Stairs-Rails: Right;Left;Can reach both Entrance Stairs-Number of Steps: 3 Home Layout: Able to live on main level with bedroom/bathroom;Two level Home Equipment: Cane - single point;Walker - 4 wheels;Wheelchair - Brewing technologist - built in      Prior Function Level of Independence: Independent with assistive device(s)         Comments: Has been using RW past couple weeks due to worsening R knee pain. Baseline uses walking stick. Husband remodels old cars     Hand Dominance  Extremity/Trunk Assessment   Upper Extremity Assessment Upper Extremity Assessment: Overall WFL for tasks assessed    Lower Extremity Assessment Lower Extremity Assessment: RLE deficits/detail RLE Deficits / Details: s/p R TKA; knee flex/ext < 3/5, limited by pain RLE:  Unable to fully assess due to pain;Unable to fully assess due to immobilization       Communication   Communication: No difficulties  Cognition Arousal/Alertness: Awake/alert Behavior During Therapy: WFL for tasks assessed/performed Overall Cognitive Status: Within Functional Limits for tasks assessed                                        General Comments      Exercises     Assessment/Plan    PT Assessment Patient needs continued PT services  PT Problem List Decreased strength;Decreased range of motion;Decreased activity tolerance;Decreased balance;Decreased mobility;Decreased knowledge of use of DME;Decreased knowledge of precautions;Pain       PT Treatment Interventions DME instruction;Gait training;Stair training;Functional mobility training;Therapeutic activities;Therapeutic exercise;Balance training;Patient/family education    PT Goals (Current goals can be found in the Care Plan section)  Acute Rehab PT Goals Patient Stated Goal: Return home with HHPT PT Goal Formulation: With patient Time For Goal Achievement: 06/14/19 Potential to Achieve Goals: Good    Frequency 7X/week   Barriers to discharge        Co-evaluation               AM-PAC PT "6 Clicks" Mobility  Outcome Measure Help needed turning from your back to your side while in a flat bed without using bedrails?: A Little Help needed moving from lying on your back to sitting on the side of a flat bed without using bedrails?: A Little Help needed moving to and from a bed to a chair (including a wheelchair)?: A Lot Help needed standing up from a chair using your arms (e.g., wheelchair or bedside chair)?: A Lot Help needed to walk in hospital room?: A Lot Help needed climbing 3-5 steps with a railing? : A Lot 6 Click Score: 14    End of Session Equipment Utilized During Treatment: Gait belt Activity Tolerance: Patient tolerated treatment well;Patient limited by pain Patient left:  in chair;with call bell/phone within reach;with nursing/sitter in room Nurse Communication: Mobility status PT Visit Diagnosis: Other abnormalities of gait and mobility (R26.89);Pain Pain - Right/Left: Right Pain - part of body: Knee    Time: 1173-5670 PT Time Calculation (min) (ACUTE ONLY): 26 min   Charges:   PT Evaluation $PT Eval Moderate Complexity: 1 Mod PT Treatments $Gait Training: 8-22 mins      Mabeline Caras, PT, DPT Acute Rehabilitation Services  Pager [phone] Office [place] 05/31/2019, 9:10 AM

## 2019-05-31 NOTE — Discharge Instructions (Signed)

## 2019-05-31 NOTE — Progress Notes (Signed)
The patient refused CPM machine this evening.  Ortho Tech was in the patient's room educating the patient regarding CPM

## 2019-05-31 NOTE — Progress Notes (Signed)
Physical Therapy Treatment Patient Details Name: [name] MRN: [id number] DOB: 1946-03-26 Today's Date: 05/31/2019    History of Present Illness Pt is a 73 y.o. female s/p elective R TKA on 05/30/19. PMH includes multiple back sxs, stroke, HTN, arthritis.   PT Comments    Pt progressing with mobility. Pt currently requires modA to stand; able to increase ambulation distance in room with RW and min guard. Improved ability to walk, although remains limited by significant R knee pain. Pt highly motivated to participate in order to return home with assist from husband and HHPT services.    Follow Up Recommendations  Follow surgeon's recommendation for DC plan and follow-up therapies;Supervision for mobility/OOB     Equipment Recommendations  Rolling walker with 5" wheels    Recommendations for Other Services       Precautions / Restrictions Precautions Precautions: Knee;Fall Precaution Comments: Verbally reviewed precautions/positioning Restrictions Weight Bearing Restrictions: Yes RLE Weight Bearing: Weight bearing as tolerated    Mobility  Bed Mobility Overal bed mobility: Needs Assistance Bed Mobility: Supine to Sit     Supine to sit: Min assist     General bed mobility comments: MinA for UE support to assist trunk elevation  Transfers Overall transfer level: Needs assistance Equipment used: Rolling walker (2 wheeled) Transfers: Sit to/from Stand Sit to Stand: Mod assist;Min assist         General transfer comment: Reliant on modA to assist trunk elevation standing from EOB; pt able to stand 2x more from recliner with minA, heavy reliance on UE support  Ambulation/Gait Ambulation/Gait assistance: Min guard Gait Distance (Feet): 22 Feet Assistive device: Rolling walker (2 wheeled) Gait Pattern/deviations: Step-to pattern;Decreased weight shift to right;Antalgic;Trunk flexed Gait velocity: Decrreased Gait velocity interpretation: <1.31 ft/sec, indicative of  household ambulator General Gait Details: Amb 10'+12' with RW and min guard for balance, seated rest due to pain/fatigue in between distance. Slow, antalgic gait; improved ability to take complete steps with BLE; intermittent cues for sequencing   Stairs             Wheelchair Mobility    Modified Rankin (Stroke Patients Only)       Balance Overall balance assessment: Needs assistance   Sitting balance-Leahy Scale: Fair       Standing balance-Leahy Scale: Poor Standing balance comment: Reliant on UE support                            Cognition Arousal/Alertness: Awake/alert Behavior During Therapy: WFL for tasks assessed/performed Overall Cognitive Status: Within Functional Limits for tasks assessed                                 General Comments: Some short-term memory deficits, likely baseline cognition      Exercises Total Joint Exercises Heel Slides: AAROM;Right;Seated Long Arc Quad: AROM;Right;Seated    General Comments        Pertinent Vitals/Pain Pain Assessment: Faces Faces Pain Scale: Hurts even more Pain Location: R knee Pain Descriptors / Indicators: Sharp;Shooting;Guarding;Grimacing Pain Intervention(s): Monitored during session;Premedicated before session    Home Living                      Prior Function            PT Goals (current goals can now be found in the care plan section) Acute Rehab PT Goals  Patient Stated Goal: Return home with HHPT and assist from husband PT Goal Formulation: With patient Time For Goal Achievement: 06/14/19 Potential to Achieve Goals: Good Progress towards PT goals: Progressing toward goals    Frequency    7X/week      PT Plan      Co-evaluation              AM-PAC PT "6 Clicks" Mobility   Outcome Measure  Help needed turning from your back to your side while in a flat bed without using bedrails?: A Little Help needed moving from lying on your back  to sitting on the side of a flat bed without using bedrails?: A Little Help needed moving to and from a bed to a chair (including a wheelchair)?: A Lot Help needed standing up from a chair using your arms (e.g., wheelchair or bedside chair)?: A Lot Help needed to walk in hospital room?: A Little Help needed climbing 3-5 steps with a railing? : A Lot 6 Click Score: 15    End of Session Equipment Utilized During Treatment: Gait belt Activity Tolerance: Patient tolerated treatment well Patient left: in chair;with call bell/phone within reach Nurse Communication: Mobility status PT Visit Diagnosis: Other abnormalities of gait and mobility (R26.89);Pain Pain - Right/Left: Right Pain - part of body: Knee     Time: 8887-5797 PT Time Calculation (min) (ACUTE ONLY): 29 min  Charges:  $Gait Training: 8-22 mins $Therapeutic Activity: 8-22 mins                    Mabeline Caras, PT, DPT Acute Rehabilitation Services  Pager [phone] Office [place] 05/31/2019, 5:16 PM

## 2019-06-01 DIAGNOSIS — M25761 Osteophyte, right knee: Secondary | ICD-10-CM | POA: Diagnosis present

## 2019-06-01 DIAGNOSIS — G8929 Other chronic pain: Secondary | ICD-10-CM | POA: Diagnosis present

## 2019-06-01 DIAGNOSIS — E669 Obesity, unspecified: Secondary | ICD-10-CM | POA: Diagnosis present

## 2019-06-01 DIAGNOSIS — Z885 Allergy status to narcotic agent status: Secondary | ICD-10-CM | POA: Diagnosis not present

## 2019-06-01 DIAGNOSIS — M170 Bilateral primary osteoarthritis of knee: Secondary | ICD-10-CM | POA: Diagnosis present

## 2019-06-01 DIAGNOSIS — I10 Essential (primary) hypertension: Secondary | ICD-10-CM | POA: Diagnosis not present

## 2019-06-01 DIAGNOSIS — Z6835 Body mass index (BMI) 35.0-35.9, adult: Secondary | ICD-10-CM | POA: Diagnosis not present

## 2019-06-01 DIAGNOSIS — E782 Mixed hyperlipidemia: Secondary | ICD-10-CM | POA: Diagnosis not present

## 2019-06-01 DIAGNOSIS — Z8673 Personal history of transient ischemic attack (TIA), and cerebral infarction without residual deficits: Secondary | ICD-10-CM | POA: Diagnosis not present

## 2019-06-01 DIAGNOSIS — D62 Acute posthemorrhagic anemia: Secondary | ICD-10-CM | POA: Diagnosis not present

## 2019-06-01 DIAGNOSIS — Z9181 History of falling: Secondary | ICD-10-CM | POA: Diagnosis not present

## 2019-06-01 DIAGNOSIS — D509 Iron deficiency anemia, unspecified: Secondary | ICD-10-CM | POA: Diagnosis not present

## 2019-06-01 DIAGNOSIS — Z888 Allergy status to other drugs, medicaments and biological substances status: Secondary | ICD-10-CM | POA: Diagnosis not present

## 2019-06-01 DIAGNOSIS — R7301 Impaired fasting glucose: Secondary | ICD-10-CM | POA: Diagnosis not present

## 2019-06-01 DIAGNOSIS — M1711 Unilateral primary osteoarthritis, right knee: Secondary | ICD-10-CM | POA: Diagnosis not present

## 2019-06-01 DIAGNOSIS — M549 Dorsalgia, unspecified: Secondary | ICD-10-CM | POA: Diagnosis present

## 2019-06-01 LAB — CBC
HCT: 28.9 % — ABNORMAL LOW (ref 36.0–46.0)
Hemoglobin: 9.1 g/dL — ABNORMAL LOW (ref 12.0–15.0)
MCH: 26.8 pg (ref 26.0–34.0)
MCHC: 31.5 g/dL (ref 30.0–36.0)
MCV: 85 fL (ref 80.0–100.0)
Platelets: 171 10*3/uL (ref 150–400)
RBC: 3.4 MIL/uL — ABNORMAL LOW (ref 3.87–5.11)
RDW: 14.7 % (ref 11.5–15.5)
WBC: 7.2 10*3/uL (ref 4.0–10.5)
nRBC: 0 % (ref 0.0–0.2)

## 2019-06-01 MED ORDER — ASPIRIN 325 MG PO TBEC: 325.0000 mg | DELAYED_RELEASE_TABLET | Freq: Two times a day (BID) | ORAL | 0 refills | Status: DC

## 2019-06-01 MED ORDER — OXYCODONE HCL 5 MG PO TABS: 5.0000 mg | ORAL_TABLET | ORAL | 0 refills | Status: DC | PRN

## 2019-06-01 MED ORDER — TIZANIDINE HCL 4 MG PO TABS: 4.0000 mg | ORAL_TABLET | Freq: Three times a day (TID) | ORAL | 0 refills | Status: DC | PRN

## 2019-06-01 NOTE — Progress Notes (Signed)
Orthopedic Tech Progress Note Patient Details:  [name] [birth year] [id number]  CPM Right Knee CPM Right Knee: On Right Knee Flexion (Degrees): 40 Right Knee Extension (Degrees): 5 Additional Comments: Seated in recliner  Post Interventions Patient Tolerated: Well Instructions Provided: Care of device, Adjustment of device  [place] Pink 06/01/2019, 5:00 PM

## 2019-06-01 NOTE — Plan of Care (Signed)
  Problem: Clinical Measurements: Goal: Ability to maintain clinical measurements within normal limits will improve Outcome: Progressing   Problem: Coping: Goal: Level of anxiety will decrease Outcome: Progressing   

## 2019-06-01 NOTE — Progress Notes (Signed)
Patient ID: [name], female   DOB: 07-17-1946, 73 y.o.   MRN: [id number] Was informed of patient's light-headedness.  Will hold on discharge today and hopefully can discharge tomorrow.

## 2019-06-01 NOTE — Progress Notes (Signed)
Occupational Therapy Evaluation Patient Details Name: [name] MRN: [id number] DOB: Jul 24, 1946 Today's Date: 06/01/2019    History of Present Illness Pt is a 73 y.o. female s/p elective R TKA on 05/30/19. PMH includes multiple back sxs, stroke, HTN, arthritis.   Clinical Impression   Pt presents with above diagnosis. PTA pt PLOF required mod I for some ADLs with use of AE for functional mobility. Pt currently requires Min A for functional transfer for simulated toilet transfer to recliner and Max A LB ADLs. Pt will benefit from additional acute rehab to address ADLs prior to DC setting to ensure safety and to maximize independence in ADLs. DC recommendation to [place].    Follow Up Recommendations  Home health OT    Equipment Recommendations       Recommendations for Other Services       Precautions / Restrictions Precautions Precautions: Knee;Fall Precaution Comments: Verbally reviewed precautions/positioning Restrictions Weight Bearing Restrictions: Yes RLE Weight Bearing: Weight bearing as tolerated      Mobility Bed Mobility Overal bed mobility: Needs Assistance Bed Mobility: Supine to Sit     Supine to sit: Min assist     General bed mobility comments: Pt able to demonstrate use of LLE to support RLE to bring towards EOB. Min A to assist trunk elevation.    Transfers Overall transfer level: Needs assistance Equipment used: Rolling walker (2 wheeled) Transfers: Sit to/from Stand Sit to Stand: Min assist;Min guard         General transfer comment: Less assistance required with sit to stand transition. Min A for safety.    Balance Overall balance assessment: Needs assistance   Sitting balance-Leahy Scale: Fair       Standing balance-Leahy Scale: Poor Standing balance comment: Reliant on UE support                           ADL either performed or assessed with clinical judgement   ADL Overall ADL's : Needs  assistance/impaired Eating/Feeding: Set up;Sitting   Grooming: Wash/dry hands;Wash/dry face;Oral care;Set up;Sitting   Upper Body Bathing: Independent;Sitting   Lower Body Bathing: Moderate assistance   Upper Body Dressing : Independent;Sitting   Lower Body Dressing: Maximal assistance   Toilet Transfer: Minimal assistance;Cueing for safety;Cueing for sequencing;Ambulation Toilet Transfer Details (indicate cue type and reason): simulated toilet transfer from bed to recliner.         Functional mobility during ADLs: Minimal assistance General ADL Comments: Pt requires Max A LB Dressing due to limited function and pain     Vision         Perception     Praxis      Pertinent Vitals/Pain Pain Assessment: 0-10 Pain Score: 5  Faces Pain Scale: Hurts whole lot Pain Location: R knee Pain Descriptors / Indicators: Sharp;Shooting;Guarding;Grimacing;Moaning Pain Intervention(s): Monitored during session;Repositioned     Hand Dominance Right   Extremity/Trunk Assessment Upper Extremity Assessment Upper Extremity Assessment: Overall WFL for tasks assessed   Lower Extremity Assessment Lower Extremity Assessment: Defer to PT evaluation        Communication Communication Communication: No difficulties   Cognition Arousal/Alertness: Awake/alert Behavior During Therapy: WFL for tasks assessed/performed Overall Cognitive Status: Within Functional Limits for tasks assessed                                 General Comments: Some short-term memory deficits, likely baseline  cognition. Seems to mask pain/symptoms   General Comments      Exercises     Shoulder Instructions      Home Living Family/patient expects to be discharged to:: Private residence Living Arrangements: Spouse/significant other Available Help at Discharge: Family;Available 24 hours/day Type of Home: House Home Access: Stairs to enter [employer] of Steps: 3 Entrance  Stairs-Rails: Right;Left;Can reach both Home Layout: Able to live on main level with bedroom/bathroom;Two level     Bathroom Shower/Tub: Walk-in shower;Tub/shower unit   Bathroom Toilet: Handicapped height     Home Equipment: Cane - single point;Walker - 4 wheels;Wheelchair - manual;Shower seat - built Hotel manager: Reacher;Sock aid(Mentions having AE from mother.) Additional Comments: uses walking stick for ambulation      Prior Functioning/Environment Level of Independence: Independent with assistive device(s)        Comments: Has been using RW past couple weeks due to worsening R knee pain. Baseline uses walking stick. Husband remodels old cars        OT Problem List: Decreased activity tolerance;Impaired balance (sitting and/or standing);Decreased safety awareness;Decreased knowledge of use of DME or AE;Decreased knowledge of precautions;Pain      OT Treatment/Interventions: Self-care/ADL training;Therapeutic exercise;DME and/or AE instruction;Therapeutic activities;Patient/family education;Balance training    OT Goals(Current goals can be found in the care plan section) Acute Rehab OT Goals Patient Stated Goal: Return home with HHPT and assist from husband OT Goal Formulation: With patient Time For Goal Achievement: 06/15/19 Potential to Achieve Goals: Good  OT Frequency: Min 1X/week   Barriers to D/C:            Co-evaluation              AM-PAC OT "6 Clicks" Daily Activity     Outcome Measure Help from another person eating meals?: None Help from another person taking care of personal grooming?: A Little Help from another person toileting, which includes using toliet, bedpan, or urinal?: A Little Help from another person bathing (including washing, rinsing, drying)?: A Little Help from another person to put on and taking off regular upper body clothing?: None Help from another person to put on and taking off regular lower body  clothing?: A Lot 6 Click Score: 19   End of Session Equipment Utilized During Treatment: Gait belt;Rolling walker CPM Right Knee CPM Right Knee: Off Additional Comments: Seated in recliner Nurse Communication: Mobility status;Weight bearing status  Activity Tolerance: No increased pain;Patient tolerated treatment well Patient left: in chair;with call bell/phone within reach  OT Visit Diagnosis: Unsteadiness on feet (R26.81);Repeated falls (R29.6);Pain Pain - Right/Left: Right Pain - part of body: Knee                Time: 0913-0929 OT Time Calculation (min): 16 min Charges:  OT General Charges $OT Visit: 1 Visit OT Evaluation $OT Eval Low Complexity: [place], MSOT, OTR/L  Supplemental Rehabilitation Services  [phone]   Marius Ditch 06/01/2019, 12:11 PM

## 2019-06-01 NOTE — TOC Transition Note (Signed)
Transition of Care [facility]) - CM/SW Discharge Note   Patient Details  Name: [name] MRN: [id number] Date of Birth: 05/16/1946  Transition of [facility]) CM/SW Contact:  Ninfa Meeker, RN Phone Number: [phone] (working remotely) 06/01/2019, 12:51 PM   Clinical Narrative:  73 yr old female s/p right total knee replacement. Case manager spoke with patient via telephone to discuss discharge plan and DME. Patient says she is borrowing a front  wheeled walker from her mother, and she has a 3in1. Referral for Home Health therapy was called to Joen Laura, [facility] ([place]). Patient will have support from her husband and family at discharge.    Final next level of care: [place] Barriers to Discharge: No Barriers Identified   Patient Goals and CMS Choice Patient states their goals for this hospitalization and ongoing recovery are:: to get better CMS Medicare.gov Compare Post Acute Care list provided to:: Other (Comment Required) Choice offered to / list presented to : Patient(spoke with patient via telephone)  Discharge Placement                       Discharge Plan and Services In-house Referral: NA Discharge Planning Services: CM Consult Post Acute Care Choice: Home Health          DME Arranged: N/A         HH Arranged: PT   Date HH Agency Contacted: 06/01/19 Time [place]: 1229 Representative spoke with at [place]: [place] Determinants of Health ([place]) Interventions     Readmission Risk Interventions No flowsheet data found.

## 2019-06-01 NOTE — Progress Notes (Signed)
Subjective: 2 Days Post-Op Procedure(s) (LRB): RIGHT TOTAL KNEE ARTHROPLASTY (Right) Patient reports pain as moderate.    Objective: Vital signs in last 24 hours: Temp:  [99 F (37.2 C)-99.7 F (37.6 C)] 99 F (37.2 C) (07/16 0448) Pulse Rate:  [84-89] 89 (07/16 0448) Resp:  [16-17] 16 (07/16 0448) BP: (91-134)/(47-60) 134/52 (07/16 0448) SpO2:  [93 %-96 %] 95 % (07/16 0448)  Intake/Output from previous day: 07/15 0701 - 07/16 0700 In: 600 [P.O.:600] Out: -  Intake/Output this shift: No intake/output data recorded.  Recent Labs    05/31/19 0332 06/01/19 0349  HGB 9.6* 9.1*   Recent Labs    05/31/19 0332 06/01/19 0349  WBC 7.0 7.2  RBC 3.53* 3.40*  HCT 30.4* 28.9*  PLT 191 171   Recent Labs    05/31/19 0332  NA 132*  K 4.4  CL 100  CO2 22  BUN 16  CREATININE 1.08*  GLUCOSE 141*  CALCIUM 8.5*   No results for input(s): LABPT, INR in the last 72 hours.  Sensation intact distally Intact pulses distally Dorsiflexion/Plantar flexion intact Incision: dressing C/D/I No cellulitis present Compartment soft   Assessment/Plan: 2 Days Post-Op Procedure(s) (LRB): RIGHT TOTAL KNEE ARTHROPLASTY (Right) Up with therapy Discharge home with home health this afternoon.      Mcarthur Rossetti 06/01/2019, 7:12 AM

## 2019-06-01 NOTE — Progress Notes (Signed)
Orthopedic Tech Progress Note Patient Details:  [name] 1946-03-17 [id number]  CPM Right Knee CPM Right Knee: Off Right Knee Flexion (Degrees): 40 Right Knee Extension (Degrees): 5 Additional Comments: Seated in recliner  Post Interventions Patient Tolerated: Well Instructions Provided: Care of device, Adjustment of device  [place] Pink 06/01/2019, 5:02 PM

## 2019-06-01 NOTE — Discharge Summary (Signed)
Patient ID: [name] MRN: [id number] DOB/AGE: 1946-02-15 73 y.o.  Admit date: 05/30/2019 Discharge date: 06/01/2019  Admission Diagnoses:  Principal Problem:   Unilateral primary osteoarthritis, right knee Active Problems:   Status post total right knee replacement   Discharge Diagnoses:  Same  Past Medical History:  Diagnosis Date  . Arthritis   . Cataracts, bilateral    surgery to remove cataracts - bilateral  . Chronic back pain   . Hypertension   . Stroke ([place])   . Wears dentures     Surgeries: Procedure(s): RIGHT TOTAL KNEE ARTHROPLASTY on 05/30/2019   Consultants:   Discharged Condition: Improved  Hospital Course: [name] is an 73 y.o. female who was admitted 05/30/2019 for operative treatment ofUnilateral primary osteoarthritis, right knee. Patient has severe unremitting pain that affects sleep, daily activities, and work/hobbies. After pre-op clearance the patient was taken to the operating room on 05/30/2019 and underwent  Procedure(s): RIGHT TOTAL KNEE ARTHROPLASTY.    Patient was given perioperative antibiotics:  Anti-infectives (From admission, onward)   Start     Dose/Rate Route Frequency Ordered Stop   05/31/19 0600  ceFAZolin (ANCEF) IVPB 2g/100 mL premix     2 g 200 mL/hr over 30 Minutes Intravenous On call to O.R. 05/30/19 1241 05/30/19 1443   05/30/19 2100  ceFAZolin (ANCEF) IVPB 1 g/50 mL premix     1 g 100 mL/hr over 30 Minutes Intravenous Every 6 hours 05/30/19 1855 05/31/19 0315   05/30/19 1246  ceFAZolin (ANCEF) 2-4 GM/100ML-% IVPB    Note to Pharmacy: Marga Melnick   : cabinet override      05/30/19 1246 05/30/19 1443       Patient was given sequential compression devices, early ambulation, and chemoprophylaxis to prevent DVT.  Patient benefited maximally from hospital stay and there were no complications.    Recent vital signs:  Patient Vitals for the past 24 hrs:  BP Temp Temp src Pulse Resp SpO2  06/01/19 0448 (!) 134/52 99  F (37.2 C) Oral 89 16 95 %  05/31/19 1947 127/60 99.7 F (37.6 C) Oral 84 16 93 %  05/31/19 1455 (!) 91/48 99.1 F (37.3 C) Oral 85 16 96 %  05/31/19 0857 (!) 106/47 - - 84 - -  05/31/19 0854 (!) 106/47 99.4 F (37.4 C) Oral 84 17 94 %     Recent laboratory studies:  Recent Labs    05/31/19 0332 06/01/19 0349  WBC 7.0 7.2  HGB 9.6* 9.1*  HCT 30.4* 28.9*  PLT 191 171  NA 132*  --   K 4.4  --   CL 100  --   CO2 22  --   BUN 16  --   CREATININE 1.08*  --   GLUCOSE 141*  --   CALCIUM 8.5*  --      Discharge Medications:   Allergies as of 06/01/2019      Reactions   Flexeril [cyclobenzaprine] Nausea Only   Hydrocodone Nausea Only   Lyrica [pregabalin] Palpitations   Neurontin [gabapentin] Palpitations      Medication List    STOP taking these medications   oxyCODONE-acetaminophen 5-325 MG tablet Commonly known as: PERCOCET/ROXICET     TAKE these medications   aspirin 325 MG EC tablet Take 1 tablet (325 mg total) by mouth 2 (two) times daily after a meal. What changed:   medication strength  how much to take  when to take this   atenolol 25 MG tablet Commonly  known as: TENORMIN Take 25 mg by mouth daily.   atorvastatin 40 MG tablet Commonly known as: Lipitor Take 1 tablet (40 mg total) by mouth daily.   docusate sodium 100 MG capsule Commonly known as: COLACE Take 1 capsule (100 mg total) by mouth 2 (two) times daily.   losartan-hydrochlorothiazide 50-12.5 MG tablet Commonly known as: HYZAAR Take 0.5 tablets by mouth daily.   oxyCODONE 5 MG immediate release tablet Commonly known as: Oxy IR/ROXICODONE Take 1-2 tablets (5-10 mg total) by mouth every 4 (four) hours as needed for moderate pain (pain score 4-6).   tiZANidine 4 MG tablet Commonly known as: ZANAFLEX Take 1 tablet (4 mg total) by mouth every 8 (eight) hours as needed.            Durable Medical Equipment  (From admission, onward)         Start     Ordered   05/30/19 1855   DME Walker rolling  Once    Question:  Patient needs a walker to treat with the following condition  Answer:  Status post total right knee replacement   05/30/19 1855   05/30/19 1855  DME 3 n 1  Once     05/30/19 1855          Diagnostic Studies: Dg Knee Right Port  Result Date: 05/30/2019 CLINICAL DATA:  Status post total right knee arthroplasty. EXAM: PORTABLE RIGHT KNEE - 1-2 VIEW COMPARISON:  April 18, 2019 FINDINGS: Post total right hip arthroplasty with normal alignment of the hardware components. Suprapatellar high density effusion containing gas fluid level. Soft tissue swelling and emphysema. Skin staples in place. IMPRESSION: Post total right hip arthroplasty without evidence of immediate complications. Electronically Signed   By: Fidela [place] M.D.   On: 05/30/2019 17:16    Disposition: Discharge disposition: 01-Home or Self Care         Follow-up Information    Mcarthur Rossetti, MD. Schedule an appointment as soon as possible for a visit in 2 week(s).   Specialty: Orthopedic Surgery Contact information: [place] [address] [phone]            Signed: Mcarthur Rossetti 06/01/2019, 7:14 AM

## 2019-06-01 NOTE — Care Management Obs Status (Signed)
[place] NOTIFICATION   Patient Details  Name: CACI ORREN MRN: [id number] Date of Birth: [birth year]   Medicare Observation Status Notification Given:  Yes    Ninfa Meeker, RN 06/01/2019, 12:22 PM

## 2019-06-01 NOTE — Progress Notes (Signed)
Physical Therapy Treatment Patient Details Name: [name] MRN: [id number] DOB: 12-30-1945 Today's Date: 06/01/2019    History of Present Illness Pt is a 73 y.o. female s/p elective R TKA on 05/30/19. PMH includes multiple back sxs, stroke, HTN, arthritis.   PT Comments    Pt slowly progressing with mobility. Attempted stair training, but pt c/o lightheadedness requiring modA to safely descend in order to quickly sit. Pt attempted additional ambulation, again becoming lightheaded; BP down to 98/56. Feel best to hold off on d/c home today. RN and MD notified. Will plan to progress mobility during afternoon session.   Follow Up Recommendations  Follow surgeon's recommendation for DC plan and follow-up therapies;Supervision for mobility/OOB     Equipment Recommendations  Rolling walker with 5" wheels    Recommendations for Other Services       Precautions / Restrictions Precautions Precautions: Knee;Fall Restrictions Weight Bearing Restrictions: Yes RLE Weight Bearing: Weight bearing as tolerated    Mobility  Bed Mobility Overal bed mobility: Needs Assistance             General bed mobility comments: Received sitting in recliner  Transfers Overall transfer level: Needs assistance Equipment used: Rolling walker (2 wheeled) Transfers: Sit to/from Stand Sit to Stand: Min assist;Min guard         General transfer comment: MinA to assist trunk elevation initial standing from chair to RW; pt able to perform 2x more standing from recliner with close min guard. Increased time and effort, heavy reliance on BUE support.  Ambulation/Gait Ambulation/Gait assistance: Min guard Gait Distance (Feet): 6 Feet     Gait velocity: Decreased   General Gait Details: Pt able to take steps towards chair and amb additional 6' with RW and close min guard; difficulty clearing feet for complete steps. C/o lightheadedness requiring seated rest. BP down to 98/56   Stairs Stairs:  Yes Stairs assistance: Mod assist Stair Management: Two rails;Step to pattern;Forwards;Backwards Number of Stairs: 2 General stair comments: Pt attempted ascending steps with BUE rail support; minA for balance. On second step, pt c/o lightheadedness requiring modA to safely descend in order to quickly sit in recliner   Wheelchair Mobility    Modified Rankin (Stroke Patients Only)       Balance Overall balance assessment: Needs assistance   Sitting balance-Leahy Scale: Fair       Standing balance-Leahy Scale: Poor Standing balance comment: Reliant on UE support                            Cognition Arousal/Alertness: Awake/alert Behavior During Therapy: WFL for tasks assessed/performed Overall Cognitive Status: Within Functional Limits for tasks assessed                                 General Comments: Some short-term memory deficits, likely baseline cognition. Seems to mask pain/symptoms      Exercises      General Comments General comments (skin integrity, edema, etc.): Pt preparing for d/c after second session today; discussed how this might not be best option due to symptomatic and unable to progress mobility. MD notified      Pertinent Vitals/Pain Pain Assessment: Faces Faces Pain Scale: Hurts whole lot Pain Location: R knee Pain Descriptors / Indicators: Sharp;Shooting;Guarding;Grimacing;Moaning Pain Intervention(s): Monitored during session;Premedicated before session    Home Living  Prior Function            PT Goals (current goals can now be found in the care plan section) Acute Rehab PT Goals Patient Stated Goal: Return home with HHPT and assist from husband PT Goal Formulation: With patient Time For Goal Achievement: 06/14/19 Potential to Achieve Goals: Good Progress towards PT goals: Progressing toward goals    Frequency    7X/week      PT Plan      Co-evaluation               AM-PAC PT "6 Clicks" Mobility   Outcome Measure  Help needed turning from your back to your side while in a flat bed without using bedrails?: A Little Help needed moving from lying on your back to sitting on the side of a flat bed without using bedrails?: A Little Help needed moving to and from a bed to a chair (including a wheelchair)?: A Little Help needed standing up from a chair using your arms (e.g., wheelchair or bedside chair)?: A Little Help needed to walk in hospital room?: A Little Help needed climbing 3-5 steps with a railing? : A Lot 6 Click Score: 17    End of Session Equipment Utilized During Treatment: Gait belt Activity Tolerance: Treatment limited secondary to medical complications (Comment) Patient left: in chair;with call bell/phone within reach Nurse Communication: Mobility status PT Visit Diagnosis: Other abnormalities of gait and mobility (R26.89);Pain Pain - Right/Left: Right Pain - part of body: Knee     Time: 0761-5183 PT Time Calculation (min) (ACUTE ONLY): 27 min  Charges:  $Gait Training: 8-22 mins $Self Care/Home Management: [place], PT, DPT Acute Rehabilitation Services  Pager [phone] Office [place] 06/01/2019, 10:36 AM

## 2019-06-01 NOTE — Progress Notes (Signed)
Physical Therapy Treatment Patient Details Name: NEELIE WELSHANS MRN: [id number] DOB: 1946/09/16 Today's Date: 06/01/2019    History of Present Illness Pt is a 73 y.o. female s/p elective R TKA on 05/30/19. PMH includes multiple back sxs, stroke, HTN, arthritis.   PT Comments    Pt slowly progressing with mobility. Able to ambulate 20' + 16' with RW, requiring seated rest break due to "feeling weak" and lightheadedness. BP 98/58 post-ambulation, maintaining at 96/59 at end of session. Pt c/o pain and nausea. Pt will need to be able to ambulate further and ascend 3 steps prior to safe d/c home. Will follow-up for additional treatment sessions tomorrow.   Follow Up Recommendations  Follow surgeon's recommendation for DC plan and follow-up therapies;Supervision for mobility/OOB     Equipment Recommendations  Rolling walker with 5" wheels    Recommendations for Other Services       Precautions / Restrictions Precautions Precautions: Knee;Fall Restrictions Weight Bearing Restrictions: Yes RLE Weight Bearing: Weight bearing as tolerated    Mobility  Bed Mobility Overal bed mobility: Needs Assistance Bed Mobility: Supine to Sit     Supine to sit: Min assist     General bed mobility comments: MinA to assist RLE and trunk elevation  Transfers Overall transfer level: Needs assistance Equipment used: Rolling walker (2 wheeled) Transfers: Sit to/from Stand Sit to Stand: Min assist;Min guard         General transfer comment: Increased time and effort, heavy reliance on BUE support to stand; stood 3x this session with close min guard to minA  Ambulation/Gait Ambulation/Gait assistance: Min guard Gait Distance (Feet): 36 Feet Assistive device: Rolling walker (2 wheeled) Gait Pattern/deviations: Step-to pattern;Decreased weight shift to right;Antalgic;Trunk flexed Gait velocity: Decreased   General Gait Details: Pt able to amb 20' + seated rest + 16' with RW and close min guard  for safety, chair follow due to pt c/o lightheadedness. Very slow, antalgic gait; pt reports "I feel so weak." BP 98/58   Stairs             Wheelchair Mobility    Modified Rankin (Stroke Patients Only)       Balance Overall balance assessment: Needs assistance   Sitting balance-Leahy Scale: Fair       Standing balance-Leahy Scale: Poor Standing balance comment: Reliant on UE support                            Cognition Arousal/Alertness: Awake/alert Behavior During Therapy: WFL for tasks assessed/performed Overall Cognitive Status: Within Functional Limits for tasks assessed                                 General Comments: Some short-term memory deficits, likely baseline cognition. Seems to mask pain/symptoms      Exercises      General Comments        Pertinent Vitals/Pain Pain Assessment: Faces Faces Pain Scale: Hurts whole lot Pain Location: R knee Pain Descriptors / Indicators: Sharp;Shooting;Guarding;Grimacing;Moaning Pain Intervention(s): Monitored during session;Premedicated before session    Home Living                      Prior Function            PT Goals (current goals can now be found in the care plan section) Acute Rehab PT Goals Patient Stated Goal: Return home  with HHPT and assist from husband PT Goal Formulation: With patient Time For Goal Achievement: 06/14/19 Potential to Achieve Goals: Good Progress towards PT goals: Progressing toward goals    Frequency    7X/week      PT Plan      Co-evaluation              AM-PAC PT "6 Clicks" Mobility   Outcome Measure  Help needed turning from your back to your side while in a flat bed without using bedrails?: A Little Help needed moving from lying on your back to sitting on the side of a flat bed without using bedrails?: A Little Help needed moving to and from a bed to a chair (including a wheelchair)?: A Little Help needed standing  up from a chair using your arms (e.g., wheelchair or bedside chair)?: A Little Help needed to walk in hospital room?: A Little Help needed climbing 3-5 steps with a railing? : A Lot 6 Click Score: 17    End of Session Equipment Utilized During Treatment: Gait belt Activity Tolerance: Patient limited by pain Patient left: in chair;with call bell/phone within reach Nurse Communication: Mobility status PT Visit Diagnosis: Other abnormalities of gait and mobility (R26.89);Pain Pain - Right/Left: Right Pain - part of body: Knee     Time: 8295-6213 PT Time Calculation (min) (ACUTE ONLY): 26 min  Charges:  $Gait Training: 23-37 mins                    Mabeline Caras, PT, DPT Acute Rehabilitation Services  Pager [phone] Office [place] 06/01/2019, 3:55 PM

## 2019-06-02 ENCOUNTER — Encounter (HOSPITAL_COMMUNITY): Payer: Self-pay | Admitting: General Practice

## 2019-06-02 DIAGNOSIS — I10 Essential (primary) hypertension: Secondary | ICD-10-CM | POA: Diagnosis not present

## 2019-06-02 DIAGNOSIS — E782 Mixed hyperlipidemia: Secondary | ICD-10-CM | POA: Diagnosis not present

## 2019-06-02 DIAGNOSIS — R7301 Impaired fasting glucose: Secondary | ICD-10-CM | POA: Diagnosis not present

## 2019-06-02 DIAGNOSIS — D509 Iron deficiency anemia, unspecified: Secondary | ICD-10-CM | POA: Diagnosis not present

## 2019-06-02 NOTE — Progress Notes (Signed)
Occupational Therapy Treatment Patient Details Name: [name] MRN: [id number] DOB: 20-Nov-1945 Today's Date: 06/02/2019    History of present illness Pt is a 73 y.o. female s/p elective R TKA on 05/30/19. PMH includes multiple back sxs, stroke, HTN, arthritis.   OT comments  Pt progressing toward OT goals and agreeable to session with no increased complaints of pain. Pt engaged in toilet transfer with Min A for safety. Pt also educated on LB dressing with AE, Min A required for safety in standing with RW, demonstration and teach back implemented. Pt will benefit from continued therapy in home setting to improve safety and to maximize independence. DC and freq remains the same.     Follow Up Recommendations  Home health OT    Equipment Recommendations       Recommendations for Other Services      Precautions / Restrictions Precautions Precautions: Knee;Fall Precaution Comments: Verbally reviewed precautions/positioning Restrictions Weight Bearing Restrictions: Yes RLE Weight Bearing: Weight bearing as tolerated       Mobility Bed Mobility Overal bed mobility: Needs Assistance Bed Mobility: Supine to Sit;Sit to Supine     Supine to sit: Min assist Sit to supine: Min assist   General bed mobility comments: MinA for RLE management, and min a for trunk elevation.  Transfers Overall transfer level: Needs assistance Equipment used: Rolling walker (2 wheeled) Transfers: Sit to/from Stand Sit to Stand: Min guard         General transfer comment: Pt able to stand from bed, BSC over toilet, and recliner with RW and supervision-min guard; no physical assist required. Heavy reliance on BUE support    Balance Overall balance assessment: Needs assistance   Sitting balance-Leahy Scale: Fair       Standing balance-Leahy Scale: Poor Standing balance comment: Reliant on UE support                           ADL either performed or assessed with clinical  judgement   ADL Overall ADL's : Needs assistance/impaired                 Upper Body Dressing : Independent;Sitting   Lower Body Dressing: Minimal assistance;With adaptive equipment;Sit to/from stand Lower Body Dressing Details (indicate cue type and reason): educated on how to don clothing with AE while seated in recliner. Toilet Transfer: Minimal assistance;Cueing for safety;Cueing for sequencing;Ambulation Toilet Transfer Details (indicate cue type and reason): Min A for safety. Pt is slow moving due to pain and for safety.         Functional mobility during ADLs: Minimal assistance General ADL Comments: Pt educated of LB dressing with AE. Demonstration and teach back from pt.     Vision       Perception     Praxis      Cognition Arousal/Alertness: Awake/alert Behavior During Therapy: WFL for tasks assessed/performed Overall Cognitive Status: Within Functional Limits for tasks assessed                                 General Comments: Some short-term memory deficits, likely baseline cognition. Seems to mask pain/symptoms        Exercises     Shoulder Instructions       General Comments Improvement of functional mobility and ADL engagement. Less VCs required.    Pertinent Vitals/ Pain       Pain Assessment:  0-10 Pain Score: 5  Faces Pain Scale: Hurts even more Pain Location: R knee Pain Descriptors / Indicators: Guarding;Grimacing Pain Intervention(s): Monitored during session;Premedicated before session;Repositioned  Home Living                                          Prior Functioning/Environment              Frequency  Min 1X/week        Progress Toward Goals  OT Goals(current goals can now be found in the care plan section)  Progress towards OT goals: Progressing toward goals  Acute Rehab OT Goals Patient Stated Goal: Return home with HHPT and assist from husband OT Goal Formulation: With  patient Time For Goal Achievement: 06/15/19 Potential to Achieve Goals: Good ADL Goals Pt Will Perform Lower Body Bathing: with modified independence;sit to/from stand Pt Will Perform Lower Body Dressing: with modified independence;sit to/from stand Pt Will Transfer to Toilet: with modified independence;ambulating;bedside commode Pt Will Perform Tub/Shower Transfer: with supervision;shower seat;ambulating  Plan Discharge plan remains appropriate;Frequency remains appropriate    Co-evaluation                 AM-PAC OT "6 Clicks" Daily Activity     Outcome Measure   Help from another person eating meals?: None Help from another person taking care of personal grooming?: A Little Help from another person toileting, which includes using toliet, bedpan, or urinal?: A Little Help from another person bathing (including washing, rinsing, drying)?: A Little Help from another person to put on and taking off regular upper body clothing?: None Help from another person to put on and taking off regular lower body clothing?: A Little 6 Click Score: 20    End of Session Equipment Utilized During Treatment: Gait belt;Rolling walker CPM Right Knee CPM Right Knee: Off  OT Visit Diagnosis: Unsteadiness on feet (R26.81);Repeated falls (R29.6);Pain Pain - Right/Left: Right Pain - part of body: Knee   Activity Tolerance No increased pain;Patient tolerated treatment well   Patient Left in chair;with call bell/phone within reach   Nurse Communication Mobility status;Weight bearing status        Time: 1761-6073 OT Time Calculation (min): 18 min  Charges: OT General Charges $OT Visit: 1 Visit OT Treatments $Self Care/Home Management : 8-22 mins  Minus Breeding, MSOT, OTR/L  Supplemental Rehabilitation Services  [phone]   Marius Ditch 06/02/2019, 11:09 AM

## 2019-06-02 NOTE — Progress Notes (Signed)
Physical Therapy Treatment Patient Details Name: [name] MRN: [id number] DOB: Mar 05, 1946 Today's Date: 06/02/2019    History of Present Illness Pt is a 73 y.o. female s/p elective R TKA on 05/30/19. PMH includes multiple back sxs, stroke, HTN, arthritis.   PT Comments    Pt feeling better this morning and making good progress. Able to stand and increase gait distance with RW and min guard for mobility. Post-ambulation BP 116/69; pt asymptomatic during session. Will plan for stair training this afternoon.   Follow Up Recommendations  Follow surgeon's recommendation for DC plan and follow-up therapies;Supervision for mobility/OOB     Equipment Recommendations  Rolling walker with 5" wheels    Recommendations for Other Services       Precautions / Restrictions Precautions Precautions: Knee;Fall Restrictions Weight Bearing Restrictions: Yes RLE Weight Bearing: Weight bearing as tolerated    Mobility  Bed Mobility Overal bed mobility: Needs Assistance Bed Mobility: Supine to Sit;Sit to Supine     Supine to sit: Min assist Sit to supine: Min assist   General bed mobility comments: MinA for RLE management  Transfers Overall transfer level: Needs assistance Equipment used: Rolling walker (2 wheeled) Transfers: Sit to/from Stand Sit to Stand: Min guard         General transfer comment: Pt able to stand from bed, BSC over toilet, and recliner with RW and supervision-min guard; no physical assist required. Heavy reliance on BUE support  Ambulation/Gait Ambulation/Gait assistance: Min guard;Supervision Gait Distance (Feet): 60 Feet Assistive device: Rolling walker (2 wheeled) Gait Pattern/deviations: Step-to pattern;Decreased weight shift to right;Antalgic;Trunk flexed Gait velocity: Decreased Gait velocity interpretation: <1.31 ft/sec, indicative of household ambulator General Gait Details: Amb 45' + seated rest + 16' with RW and intermittent min guard for balance;  stability and gait pattern improved. Post-amb BP 116/61; pt asymptomatic   Stairs             Wheelchair Mobility    Modified Rankin (Stroke Patients Only)       Balance Overall balance assessment: Needs assistance   Sitting balance-Leahy Scale: Fair       Standing balance-Leahy Scale: Poor Standing balance comment: Reliant on UE support                            Cognition Arousal/Alertness: Awake/alert Behavior During Therapy: WFL for tasks assessed/performed Overall Cognitive Status: Within Functional Limits for tasks assessed                                 General Comments: Some short-term memory deficits, likely baseline cognition. Seems to mask pain/symptoms      Exercises      General Comments        Pertinent Vitals/Pain Pain Assessment: Faces Faces Pain Scale: Hurts even more Pain Location: R knee Pain Descriptors / Indicators: Guarding;Grimacing Pain Intervention(s): Monitored during session    Home Living                      Prior Function            PT Goals (current goals can now be found in the care plan section) Acute Rehab PT Goals Patient Stated Goal: Return home with HHPT and assist from husband PT Goal Formulation: With patient Time For Goal Achievement: 06/14/19 Potential to Achieve Goals: Good Progress towards PT goals: Progressing toward goals  Frequency    7X/week      PT Plan      Co-evaluation              AM-PAC PT "6 Clicks" Mobility   Outcome Measure  Help needed turning from your back to your side while in a flat bed without using bedrails?: A Little Help needed moving from lying on your back to sitting on the side of a flat bed without using bedrails?: A Little Help needed moving to and from a bed to a chair (including a wheelchair)?: A Little Help needed standing up from a chair using your arms (e.g., wheelchair or bedside chair)?: A Little Help needed to  walk in hospital room?: A Little Help needed climbing 3-5 steps with a railing? : A Lot 6 Click Score: 17    End of Session Equipment Utilized During Treatment: Gait belt Activity Tolerance: Patient tolerated treatment well Patient left: in bed;with call bell/phone within reach Nurse Communication: Mobility status PT Visit Diagnosis: Other abnormalities of gait and mobility (R26.89);Pain Pain - Right/Left: Right Pain - part of body: Knee     Time: 7793-9030 PT Time Calculation (min) (ACUTE ONLY): 24 min  Charges:  $Gait Training: 8-22 mins $Therapeutic Activity: 8-22 mins                    Mabeline Caras, PT, DPT Acute Rehabilitation Services  Pager [phone] Office [place] 06/02/2019, 9:53 AM

## 2019-06-02 NOTE — Progress Notes (Signed)
Patient ID: [name], female   DOB: 19-Jan-1946, 73 y.o.   MRN: [id number] Looks good this am and she reports feeling better today.  Delayed discharge yesterday due to some lightheadedness.  Will likely be able to go home today.

## 2019-06-02 NOTE — Progress Notes (Signed)
Physical Therapy Treatment Patient Details Name: [name] MRN: [id number] DOB: 01-Mar-1946 Today's Date: 06/02/2019    History of Present Illness Pt is a 73 y.o. female s/p elective R TKA on 05/30/19. PMH includes multiple back sxs, stroke, HTN, arthritis.   PT Comments    Pt seen for stair training prior to d/c. Able to ascend/descend two steps with rail support, min guard for safety; will have assist from husband and son.  RW delivered to room and adjusted for height. Reviewed precautions, importance of mobility/ROM, fall risk reduction. Pt ready for d/c home with HHPT services.   Follow Up Recommendations  Follow surgeon's recommendation for DC plan and follow-up therapies;Supervision for mobility/OOB     Equipment Recommendations  Rolling walker with 5" wheels    Recommendations for Other Services       Precautions / Restrictions Precautions Precautions: Knee;Fall Precaution Comments: Verbally reviewed precautions/positioning Restrictions Weight Bearing Restrictions: Yes RLE Weight Bearing: Weight bearing as tolerated    Mobility  Bed Mobility Overal bed mobility: Needs Assistance Bed Mobility: Supine to Sit;Sit to Supine     Supine to sit: Min assist     General bed mobility comments: Received sitting in recliner  Transfers Overall transfer level: Needs assistance Equipment used: Rolling walker (2 wheeled) Transfers: Sit to/from Stand Sit to Stand: Min guard;Supervision         General transfer comment: Pt able to stand from bed, BSC over toilet, and recliner with RW and supervision-min guard; no physical assist required. Heavy reliance on BUE support  Ambulation/Gait Ambulation/Gait assistance: Supervision Gait Distance (Feet): 60 Feet Assistive device: Rolling walker (2 wheeled) Gait Pattern/deviations: Step-to pattern;Decreased weight shift to right;Antalgic;Trunk flexed     General Gait Details: Slow, guarded, antalgic gait with RW;  supervision-level. Pt asypmtomatic   Stairs Stairs: Yes Stairs assistance: Min guard Stair Management: One rail Right;Step to pattern;Sideways;Backwards Number of Stairs: 2 General stair comments: Ascended sideways and descended backwards, 2 steps with BUE support on R-side rail; min guard for safety. Educ on technique and husband/son to guard at home   Wheelchair Mobility    Modified Rankin (Stroke Patients Only)       Balance Overall balance assessment: Needs assistance   Sitting balance-Leahy Scale: Fair       Standing balance-Leahy Scale: Poor Standing balance comment: Reliant on UE support                            Cognition Arousal/Alertness: Awake/alert Behavior During Therapy: WFL for tasks assessed/performed Overall Cognitive Status: Within Functional Limits for tasks assessed                                 General Comments: Some short-term memory deficits, likely baseline cognition. Seems to mask pain/symptoms      Exercises      General Comments General comments (skin integrity, edema, etc.): Improvement of functional mobility and ADL engagement. Less VCs required.      Pertinent Vitals/Pain Pain Assessment: Faces Pain Score: 5  Faces Pain Scale: Hurts little more Pain Location: R knee Pain Descriptors / Indicators: Guarding;Grimacing Pain Intervention(s): Monitored during session    Home Living Family/patient expects to be discharged to:: Private residence Living Arrangements: Spouse/significant other                  Prior Function  PT Goals (current goals can now be found in the care plan section) Acute Rehab PT Goals Patient Stated Goal: Return home with HHPT and assist from husband PT Goal Formulation: With patient Time For Goal Achievement: 06/14/19 Potential to Achieve Goals: Good Progress towards PT goals: Progressing toward goals    Frequency    7X/week      PT Plan Current  plan remains appropriate    Co-evaluation              AM-PAC PT "6 Clicks" Mobility   Outcome Measure  Help needed turning from your back to your side while in a flat bed without using bedrails?: A Little Help needed moving from lying on your back to sitting on the side of a flat bed without using bedrails?: A Little Help needed moving to and from a bed to a chair (including a wheelchair)?: A Little Help needed standing up from a chair using your arms (e.g., wheelchair or bedside chair)?: A Little Help needed to walk in hospital room?: A Little Help needed climbing 3-5 steps with a railing? : A Little 6 Click Score: 18    End of Session Equipment Utilized During Treatment: Gait belt Activity Tolerance: Patient tolerated treatment well Patient left: (in transport chair preparing for d/c) Nurse Communication: Mobility status PT Visit Diagnosis: Other abnormalities of gait and mobility (R26.89);Pain Pain - Right/Left: Right Pain - part of body: Knee     Time: 9604-5409 PT Time Calculation (min) (ACUTE ONLY): 15 min  Charges:  $Gait Training: 8-22 mins                    Mabeline Caras, PT, DPT Acute Rehabilitation Services  Pager [phone] Office [place] 06/02/2019, 1:38 PM

## 2019-06-02 NOTE — Care Management (Signed)
Patient now needs a RW.  RW ordered from [employer] to be delivered to room prior to d.c.

## 2019-06-04 DIAGNOSIS — I10 Essential (primary) hypertension: Secondary | ICD-10-CM | POA: Diagnosis not present

## 2019-06-04 DIAGNOSIS — Z96651 Presence of right artificial knee joint: Secondary | ICD-10-CM | POA: Diagnosis not present

## 2019-06-04 DIAGNOSIS — G8929 Other chronic pain: Secondary | ICD-10-CM | POA: Diagnosis not present

## 2019-06-04 DIAGNOSIS — M1712 Unilateral primary osteoarthritis, left knee: Secondary | ICD-10-CM | POA: Diagnosis not present

## 2019-06-04 DIAGNOSIS — M48062 Spinal stenosis, lumbar region with neurogenic claudication: Secondary | ICD-10-CM | POA: Diagnosis not present

## 2019-06-04 DIAGNOSIS — Z8673 Personal history of transient ischemic attack (TIA), and cerebral infarction without residual deficits: Secondary | ICD-10-CM | POA: Diagnosis not present

## 2019-06-04 DIAGNOSIS — Z9181 History of falling: Secondary | ICD-10-CM | POA: Diagnosis not present

## 2019-06-04 DIAGNOSIS — Z471 Aftercare following joint replacement surgery: Secondary | ICD-10-CM | POA: Diagnosis not present

## 2019-06-05 ENCOUNTER — Telehealth: Payer: Self-pay | Admitting: Orthopaedic Surgery

## 2019-06-05 NOTE — Telephone Encounter (Signed)
IC verbal given.  

## 2019-06-05 NOTE — Telephone Encounter (Signed)
Joey with [facility] called in requesting verbal order for plan of care for 3 times a week for 2 weeks.  [phone]

## 2019-06-06 ENCOUNTER — Ambulatory Visit: Payer: Medicare Other

## 2019-06-07 DIAGNOSIS — Z8673 Personal history of transient ischemic attack (TIA), and cerebral infarction without residual deficits: Secondary | ICD-10-CM | POA: Diagnosis not present

## 2019-06-07 DIAGNOSIS — Z9181 History of falling: Secondary | ICD-10-CM | POA: Diagnosis not present

## 2019-06-07 DIAGNOSIS — M48062 Spinal stenosis, lumbar region with neurogenic claudication: Secondary | ICD-10-CM | POA: Diagnosis not present

## 2019-06-07 DIAGNOSIS — M1712 Unilateral primary osteoarthritis, left knee: Secondary | ICD-10-CM | POA: Diagnosis not present

## 2019-06-07 DIAGNOSIS — G8929 Other chronic pain: Secondary | ICD-10-CM | POA: Diagnosis not present

## 2019-06-07 DIAGNOSIS — Z471 Aftercare following joint replacement surgery: Secondary | ICD-10-CM | POA: Diagnosis not present

## 2019-06-07 DIAGNOSIS — I10 Essential (primary) hypertension: Secondary | ICD-10-CM | POA: Diagnosis not present

## 2019-06-07 DIAGNOSIS — Z96651 Presence of right artificial knee joint: Secondary | ICD-10-CM | POA: Diagnosis not present

## 2019-06-09 ENCOUNTER — Other Ambulatory Visit: Payer: Self-pay

## 2019-06-09 DIAGNOSIS — Z8673 Personal history of transient ischemic attack (TIA), and cerebral infarction without residual deficits: Secondary | ICD-10-CM | POA: Diagnosis not present

## 2019-06-09 DIAGNOSIS — Z96651 Presence of right artificial knee joint: Secondary | ICD-10-CM | POA: Diagnosis not present

## 2019-06-09 DIAGNOSIS — G8929 Other chronic pain: Secondary | ICD-10-CM | POA: Diagnosis not present

## 2019-06-09 DIAGNOSIS — Z471 Aftercare following joint replacement surgery: Secondary | ICD-10-CM | POA: Diagnosis not present

## 2019-06-09 DIAGNOSIS — Z9181 History of falling: Secondary | ICD-10-CM | POA: Diagnosis not present

## 2019-06-09 DIAGNOSIS — M1712 Unilateral primary osteoarthritis, left knee: Secondary | ICD-10-CM | POA: Diagnosis not present

## 2019-06-09 DIAGNOSIS — I10 Essential (primary) hypertension: Secondary | ICD-10-CM | POA: Diagnosis not present

## 2019-06-09 DIAGNOSIS — M48062 Spinal stenosis, lumbar region with neurogenic claudication: Secondary | ICD-10-CM | POA: Diagnosis not present

## 2019-06-09 NOTE — Patient Outreach (Signed)
[facility]) Care Management  06/09/2019  RAMONA RUARK [birth year] [id number]   Medication Adherence call to Mrs. Myles Gip Hippa Identifiers Verify spoke with patient she is past due on Atorvastatin 40 mg patient explain she has stop taking this medication because of side effects and doctor instructions.Mrs. Shores is showing past due under [place].   [place] Management Direct Dial [phone]  Fax [phone] [email or web address]

## 2019-06-12 DIAGNOSIS — I10 Essential (primary) hypertension: Secondary | ICD-10-CM | POA: Diagnosis not present

## 2019-06-12 DIAGNOSIS — M1712 Unilateral primary osteoarthritis, left knee: Secondary | ICD-10-CM | POA: Diagnosis not present

## 2019-06-12 DIAGNOSIS — Z9181 History of falling: Secondary | ICD-10-CM | POA: Diagnosis not present

## 2019-06-12 DIAGNOSIS — G8929 Other chronic pain: Secondary | ICD-10-CM | POA: Diagnosis not present

## 2019-06-12 DIAGNOSIS — Z8673 Personal history of transient ischemic attack (TIA), and cerebral infarction without residual deficits: Secondary | ICD-10-CM | POA: Diagnosis not present

## 2019-06-12 DIAGNOSIS — Z96651 Presence of right artificial knee joint: Secondary | ICD-10-CM | POA: Diagnosis not present

## 2019-06-12 DIAGNOSIS — M48062 Spinal stenosis, lumbar region with neurogenic claudication: Secondary | ICD-10-CM | POA: Diagnosis not present

## 2019-06-12 DIAGNOSIS — Z471 Aftercare following joint replacement surgery: Secondary | ICD-10-CM | POA: Diagnosis not present

## 2019-06-13 ENCOUNTER — Encounter: Payer: Self-pay | Admitting: Orthopaedic Surgery

## 2019-06-13 ENCOUNTER — Ambulatory Visit (INDEPENDENT_AMBULATORY_CARE_PROVIDER_SITE_OTHER): Payer: Medicare Other | Admitting: Orthopaedic Surgery

## 2019-06-13 ENCOUNTER — Other Ambulatory Visit: Payer: Self-pay

## 2019-06-13 DIAGNOSIS — Z96651 Presence of right artificial knee joint: Secondary | ICD-10-CM

## 2019-06-13 MED ORDER — OXYCODONE HCL 5 MG PO TABS: 5.0000 mg | ORAL_TABLET | ORAL | 0 refills | Status: DC | PRN

## 2019-06-13 NOTE — Progress Notes (Signed)
HPI: [name] returns today 14 days status post right total knee arthroplasty.  She is overall doing well.  She is working with physical therapy for range of motion strengthening the knee.  She needs a refill on oxycodone.  She is been on aspirin 325 mg twice daily.  She was on a 1 mg aspirin prior to surgery.  She is had no shortness of breath fevers chills.  Physical exam: Right knee surgical incisions healing well no signs of infection or dehiscence.  Right calf supple nontender.  Full extension knee flexion to approximately 100 degrees.  Impression: Status post right total knee arthroplasty 05/30/2019  Plan: Should go on 325 mg aspirin once daily for a week and then resume her 81 mg aspirin once daily after that.  Refill oxycodone was given.  Staples removed Steri-Strips applied.  We will set her up for outpatient physical therapy and [place] at the [facility] facility.  She will follow-up with [place] in a month sooner if there is any questions or concerns.  Scar tissue mobilization encouraged.

## 2019-06-14 DIAGNOSIS — M1712 Unilateral primary osteoarthritis, left knee: Secondary | ICD-10-CM | POA: Diagnosis not present

## 2019-06-14 DIAGNOSIS — Z8673 Personal history of transient ischemic attack (TIA), and cerebral infarction without residual deficits: Secondary | ICD-10-CM | POA: Diagnosis not present

## 2019-06-14 DIAGNOSIS — M48062 Spinal stenosis, lumbar region with neurogenic claudication: Secondary | ICD-10-CM | POA: Diagnosis not present

## 2019-06-14 DIAGNOSIS — Z9181 History of falling: Secondary | ICD-10-CM | POA: Diagnosis not present

## 2019-06-14 DIAGNOSIS — G8929 Other chronic pain: Secondary | ICD-10-CM | POA: Diagnosis not present

## 2019-06-14 DIAGNOSIS — Z96651 Presence of right artificial knee joint: Secondary | ICD-10-CM | POA: Diagnosis not present

## 2019-06-14 DIAGNOSIS — I10 Essential (primary) hypertension: Secondary | ICD-10-CM | POA: Diagnosis not present

## 2019-06-14 DIAGNOSIS — Z471 Aftercare following joint replacement surgery: Secondary | ICD-10-CM | POA: Diagnosis not present

## 2019-06-16 DIAGNOSIS — Z96651 Presence of right artificial knee joint: Secondary | ICD-10-CM | POA: Diagnosis not present

## 2019-06-16 DIAGNOSIS — M1712 Unilateral primary osteoarthritis, left knee: Secondary | ICD-10-CM | POA: Diagnosis not present

## 2019-06-16 DIAGNOSIS — Z471 Aftercare following joint replacement surgery: Secondary | ICD-10-CM | POA: Diagnosis not present

## 2019-06-16 DIAGNOSIS — Z9181 History of falling: Secondary | ICD-10-CM | POA: Diagnosis not present

## 2019-06-16 DIAGNOSIS — M48062 Spinal stenosis, lumbar region with neurogenic claudication: Secondary | ICD-10-CM | POA: Diagnosis not present

## 2019-06-16 DIAGNOSIS — I10 Essential (primary) hypertension: Secondary | ICD-10-CM | POA: Diagnosis not present

## 2019-06-16 DIAGNOSIS — Z8673 Personal history of transient ischemic attack (TIA), and cerebral infarction without residual deficits: Secondary | ICD-10-CM | POA: Diagnosis not present

## 2019-06-16 DIAGNOSIS — G8929 Other chronic pain: Secondary | ICD-10-CM | POA: Diagnosis not present

## 2019-06-19 ENCOUNTER — Ambulatory Visit (HOSPITAL_COMMUNITY): Payer: Medicare Other | Attending: Orthopaedic Surgery | Admitting: Physical Therapy

## 2019-06-19 ENCOUNTER — Encounter (HOSPITAL_COMMUNITY): Payer: Self-pay | Admitting: Physical Therapy

## 2019-06-19 ENCOUNTER — Other Ambulatory Visit: Payer: Self-pay

## 2019-06-19 DIAGNOSIS — R600 Localized edema: Secondary | ICD-10-CM | POA: Insufficient documentation

## 2019-06-19 DIAGNOSIS — M6281 Muscle weakness (generalized): Secondary | ICD-10-CM | POA: Diagnosis not present

## 2019-06-19 DIAGNOSIS — R262 Difficulty in walking, not elsewhere classified: Secondary | ICD-10-CM

## 2019-06-19 DIAGNOSIS — M25561 Pain in right knee: Secondary | ICD-10-CM | POA: Diagnosis not present

## 2019-06-19 NOTE — Patient Instructions (Signed)
Bridges, R knee flexion overpressure, sit to stand with more wt bearing on R in medbridge

## 2019-06-19 NOTE — Therapy (Signed)
[facility], [place], [address] Phone: [phone]   Fax:  [phone]  Physical Therapy Evaluation  Patient Details  Name: [name] MRN: [id number] Date of Birth: 1946/03/31 Referring Provider (PT): Jean Rosenthal    Encounter Date: 06/19/2019  PT End of Session - 06/19/19 1239    Visit Number  1    Number of Visits  9    Date for PT Re-Evaluation  07/17/19    Authorization Type  [employer] Medicare    Authorization Time Period  06/19/2019 to 07/17/2019    Authorization - Visit Number  1    Authorization - Number of Visits  10    PT Start Time  0915    PT Stop Time  0957    PT Time Calculation (min)  42 min    Activity Tolerance  Patient tolerated treatment well    Behavior During Therapy  [facility] for tasks assessed/performed       Past Medical History:  Diagnosis Date  . Arthritis   . Cataracts, bilateral    surgery to remove cataracts - bilateral  . Chronic back pain   . Hypertension   . Stroke ([place])   . Wears dentures     Past Surgical History:  Procedure Laterality Date  . ABDOMINAL HYSTERECTOMY  82  . APPENDECTOMY    . BACK SURGERY  2010  . BACK SURGERY  2015  . BACK SURGERY  06/2016  . CATARACT EXTRACTION W/PHACO Left 01/07/2017   Procedure: CATARACT EXTRACTION PHACO AND INTRAOCULAR LENS PLACEMENT (I[facility]);  Surgeon: Tonny Branch, MD;  Location: [facility];  Service: Ophthalmology;  Laterality: Left;  left CDE 9.31   . CATARACT EXTRACTION W/PHACO Right 01/25/2017   Procedure: CATARACT EXTRACTION PHACO AND INTRAOCULAR LENS PLACEMENT ([place]) CDE:16.07;  Surgeon: Tonny Branch, MD;  Location: [facility];  Service: Ophthalmology;  Laterality: Right;  right  . FRACTURE SURGERY Right    arm  1974  . LUMBAR WOUND DEBRIDEMENT N/A 09/23/2017   Procedure: Exploration of Lumbar Wound; Repair of Pseudomeningocele;  Surgeon: Newman Pies, MD;  Location: [facility];  Service: Neurosurgery;  Laterality: N/A;  . TONSILLECTOMY     . TOTAL KNEE ARTHROPLASTY Right 05/30/2019   Procedure: RIGHT TOTAL KNEE ARTHROPLASTY;  Surgeon: Mcarthur Rossetti, MD;  Location: [facility];  Service: Orthopedics;  Laterality: Right;    There were no vitals filed for this visit.   Subjective Assessment - 06/19/19 0917    Subjective  I had surgery on July the 17th, it will have been 3 weeks tomorrow. I got HHPT and was formally discharged last Friday. I'm managing OK with taking care of myself. No falls recently.    How long can you sit comfortably?  60 minutes    How long can you stand comfortably?  10-15 minutes    How long can you walk comfortably?  60-30ft    Patient Stated Goals  I need t oget well so I can get back to work    Currently in Pain?  Yes    Pain Score  3     Pain Location  Knee    Pain Orientation  Right    Pain Descriptors / Indicators  Aching;Dull    Pain Type  Surgical pain    Pain Radiating Towards  none    Pain Onset  1 to 4 weeks ago    Pain Frequency  Constant    Aggravating Factors   unsure  Pain Relieving Factors  ice and heat    Effect of Pain on Daily Activities  severe         [facility] PT Assessment - 06/19/19 0001      Assessment   Medical Diagnosis  s/p R TKR     Referring Provider (PT)  Jean Rosenthal     Onset Date/Surgical Date  06/02/19    Next MD Visit  Dr. Ninfa Linden end of August     Prior Therapy  yes       Precautions   Precautions  None      Balance Screen   Has the patient fallen in the past 6 months  No    Has the patient had a decrease in activity level because of a fear of falling?   Yes    Is the patient reluctant to leave their home because of a fear of falling?   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     Leisure  outdoor activities, sewing, going to car shows        Observation/Other Assessments   Observations  localized edema as expected for TKR; incision well healing and without signs of inflammation       AROM   Right Knee Extension  10    Right Knee Flexion  104      Strength   Right Hip Flexion  3/5    Right Hip Extension  4/5   based off functional bridge    Right Hip ABduction  3-/5    Left Hip Flexion  3+/5    Left Hip Extension  4/5   based off functional bridge    Left Hip ABduction  3/5    Right Knee Flexion  4+/5    Right Knee Extension  5/5    Left Knee Flexion  4+/5    Left Knee Extension  5/5    Right Ankle Dorsiflexion  5/5    Left Ankle Dorsiflexion  5/5      Special Tests   Other special tests  5 time sit to stand 27 seconds no UEs       Ambulation/Gait   Ambulation/Gait  Yes    Gait Pattern  Step-through pattern;Decreased arm swing - right;Decreased arm swing - left;Decreased step length - left;Decreased stance time - right;Decreased stride length;Decreased dorsiflexion - right;Decreased weight shift to right;Right flexed knee in stance;Antalgic;Decreased trunk rotation;Trunk flexed;Wide base of support    Gait Comments  2MWT 172ft walking cane       High Level Balance   High Level Balance Comments  tandem stance: 8 seconds R leg back, 30 seconds L leg in back                 Objective measurements completed on examination: See above findings.      [place] Adult PT Treatment/Exercise - 06/19/19 0001      Knee/Hip Exercises: Standing   Other Standing Knee Exercises  seated knee flexion with overpressure 5 second holds 1x10     Other Standing Knee Exercises  sit to stands with increased weight shift over R LE 1x10       Knee/Hip Exercises: Supine   Bridges  Both;1 set;10 reps  PT Education - 06/19/19 1239    Education Details  exercise form, HEP, POC, course of recovery following TKR    Person(s) Educated  Patient    Methods  Explanation;Handout    Comprehension  Verbalized  understanding       PT Short Term Goals - 06/19/19 1243      PT SHORT TERM GOAL #1   Title  Patient to be compliant with HEP, to be updated PRN    Time  2    Period  Weeks    Status  New    Target Date  07/03/19      PT SHORT TERM GOAL #2   Title  Patient to be independent in edema management strategies to reduce pain and discomfort related to swelling    Time  2    Period  Weeks    Status  New      PT SHORT TERM GOAL #3   Title  Patient to have gained 10 degrees of surgical knee flexion and extension in order to improve gait mechanics and reduce pain    Time  2    Period  Weeks    Status  New      PT SHORT TERM GOAL #4   Title  Patient to be able to ambulate with equal stance times/step lengths, elimination of antalgic pattern, and pain no more than 2/10 in order improve community access and facilitate return to work    Time  2    Period  Weeks    Status  New        PT Long Term Goals - 06/19/19 1251      PT LONG TERM GOAL #1   Title  Patient to show MMT in all tested muscle groups as being 5/5 in order to improve functional task tolerance and reduce pain    Time  4    Period  Weeks    Status  New    Target Date  07/17/19      PT LONG TERM GOAL #2   Title  Patient to be able to gait train 218ft during 2MWT in order to show improved mobility and ease of community access    Time  4    Period  Weeks    Status  New      PT LONG TERM GOAL #3   Title  Patient to be able to maintain SLS and tandem stance for equal amounts of time in order to show improved functional balance and strength    Time  4    Period  Weeks    Status  New      PT LONG TERM GOAL #4   Title  Patient to be able to tolerate 3 hours of work without increased pain in order to improve functional task tolerance    Time  4    Period  Weeks    Status  New             Plan - 06/19/19 1241    Clinical Impression Statement  Ms. Maple arrives approximately 3 weeks following R TKR procedure;  she reports she has "graduated" from [employer] and is looking forward to getting back to work as soon as possible. Examination is typical for R total knee replacement, and shows limited knee ROM, localized edema, gait impairment with reduced gait speed, balance deficit, and reduced functional muscle strength. She will benefit from skilled PT services to address functional limitations, reduce pain, and assist  in return to optimal level of function.    Personal Factors and Comorbidities  Age;Fitness;Past/Current Experience;Comorbidity 1;Sex;Education;Social Background;Time since onset of injury/illness/exacerbation    Examination-Activity Limitations  Bathing;Locomotion Level;Transfers;Bed Mobility;Sit;Sleep;Squat;Stairs;Stand;Lift    Examination-Participation Restrictions  Church;Yard Work;Cleaning;Driving;Interpersonal Relationship;Laundry;Shop    Stability/Clinical Decision Making  Stable/Uncomplicated    Clinical Decision Making  Low    Rehab Potential  Excellent    PT Frequency  3x / week    PT Duration  4 weeks    PT Treatment/Interventions  ADLs/Self Care Home Management;Cryotherapy;Electrical Stimulation;Iontophoresis 4mg /ml Dexamethasone;Moist Heat;Ultrasound;DME Instruction;Neuromuscular re-education;Patient/family education;Manual techniques;Scar mobilization;Passive range of motion;Therapeutic exercise;Therapeutic activities;Functional mobility training;Stair training;Gait training;Balance training;Taping    PT Next Visit Plan  review HEP; work on knee ROM, gait pattern, balance, strength as tolerated    PT Home Exercise Plan  Eval: bridges, sit to stand, knee flexion stretch, hospital HEP    Consulted and Agree with Plan of Care  Patient       Patient will benefit from skilled therapeutic intervention in order to improve the following deficits and impairments:  Abnormal gait, Decreased coordination, Decreased range of motion, Difficulty walking, Increased fascial restricitons, Decreased safety  awareness, Decreased activity tolerance, Decreased skin integrity, Pain, Decreased balance, Decreased knowledge of use of DME, Improper body mechanics, Decreased mobility, Decreased strength, Increased edema  Visit Diagnosis: 1. Acute pain of right knee   2. Difficulty in walking, not elsewhere classified   3. Localized edema   4. Muscle weakness (generalized)        Problem List Patient Active Problem List   Diagnosis Date Noted  . Status post total right knee replacement 05/30/2019  . Unilateral primary osteoarthritis, left knee 04/18/2019  . Unilateral primary osteoarthritis, right knee 04/18/2019  . CVA (cerebral vascular accident) ([place]) 08/04/2018  . Stroke ([place]) 08/04/2018  . Chronic back pain 08/04/2018  . Post-operative pain 09/20/2017  . 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 Lumbar stenosis with neurogenic claudication 02/04/2015  . Stiffness of joints, not elsewhere classified, multiple sites 10/27/2012  . Weakness of both legs 10/27/2012  . Difficulty in walking(719.7) 10/27/2012    Deniece Ree PT, DPT, CBIS  Supplemental Physical Therapist [facility]    Pager [phone] Acute Rehab Office [place] [address] [place], [place], [address] Phone: [phone]   Fax:  [phone]  Name: SCHYLER BUTIKOFER MRN: [id number] Date of Birth: [birth year]

## 2019-06-21 ENCOUNTER — Ambulatory Visit (HOSPITAL_COMMUNITY): Payer: Medicare Other | Admitting: Physical Therapy

## 2019-06-21 ENCOUNTER — Other Ambulatory Visit: Payer: Self-pay

## 2019-06-21 DIAGNOSIS — R600 Localized edema: Secondary | ICD-10-CM | POA: Diagnosis not present

## 2019-06-21 DIAGNOSIS — M6281 Muscle weakness (generalized): Secondary | ICD-10-CM | POA: Diagnosis not present

## 2019-06-21 DIAGNOSIS — R262 Difficulty in walking, not elsewhere classified: Secondary | ICD-10-CM | POA: Diagnosis not present

## 2019-06-21 DIAGNOSIS — M25561 Pain in right knee: Secondary | ICD-10-CM | POA: Diagnosis not present

## 2019-06-21 NOTE — Therapy (Signed)
[facility] [address] [place], [place], [address] Phone: [phone]   Fax:  [phone]  Physical Therapy Treatment  Patient Details  Name: [name] MRN: [id number] Date of Birth: Mar 30, 1946 Referring Provider (PT): Jean Rosenthal    Encounter Date: 06/21/2019  PT End of Session - 06/21/19 1034    Visit Number  2    Number of Visits  9    Date for PT Re-Evaluation  07/17/19    Authorization Type  [employer] Medicare    Authorization Time Period  06/19/2019 to 07/17/2019    Authorization - Visit Number  2    Authorization - Number of Visits  10    PT Start Time  [phone]    PT Stop Time  1020    PT Time Calculation (min)  42 min    Activity Tolerance  Patient tolerated treatment well    Behavior During Therapy  [facility] for tasks assessed/performed       Past Medical History:  Diagnosis Date  . Arthritis   . Cataracts, bilateral    surgery to remove cataracts - bilateral  . Chronic back pain   . Hypertension   . Stroke ([place])   . Wears dentures     Past Surgical History:  Procedure Laterality Date  . ABDOMINAL HYSTERECTOMY  82  . APPENDECTOMY    . BACK SURGERY  2010  . BACK SURGERY  2015  . BACK SURGERY  06/2016  . CATARACT EXTRACTION W/PHACO Left 01/07/2017   Procedure: CATARACT EXTRACTION PHACO AND INTRAOCULAR LENS PLACEMENT (IOC);  Surgeon: Tonny Branch, MD;  Location: [facility];  Service: Ophthalmology;  Laterality: Left;  left CDE 9.31   . CATARACT EXTRACTION W/PHACO Right 01/25/2017   Procedure: CATARACT EXTRACTION PHACO AND INTRAOCULAR LENS PLACEMENT ([place]) CDE:16.07;  Surgeon: Tonny Branch, MD;  Location: [facility];  Service: Ophthalmology;  Laterality: Right;  right  . FRACTURE SURGERY Right    arm  1974  . LUMBAR WOUND DEBRIDEMENT N/A 09/23/2017   Procedure: Exploration of Lumbar Wound; Repair of Pseudomeningocele;  Surgeon: Newman Pies, MD;  Location: [place];  Service: Neurosurgery;  Laterality: N/A;  . TONSILLECTOMY    .  TOTAL KNEE ARTHROPLASTY Right 05/30/2019   Procedure: RIGHT TOTAL KNEE ARTHROPLASTY;  Surgeon: Mcarthur Rossetti, MD;  Location: [place];  Service: Orthopedics;  Laterality: Right;    There were no vitals filed for this visit.  Subjective Assessment - 06/21/19 1005    Subjective  pt states her knee is stiff and it takes a while to get going/moving after sititng still a while.  Currently 3/10 pain.    Currently in Pain?  Yes    Pain Score  3     Pain Location  Knee    Pain Orientation  Right    Pain Descriptors / Indicators  Aching;Tightness    Pain Type  Surgical pain                       [facility] Adult PT Treatment/Exercise - 06/21/19 0001      Knee/Hip Exercises: Stretches   Active Hamstring Stretch  Right;3 reps;30 seconds    Active Hamstring Stretch Limitations  standing 12" step    Knee: Self-Stretch to increase Flexion  Right;10 seconds;Limitations    Knee: Self-Stretch Limitations  10 reps on 12" step      Knee/Hip Exercises: Aerobic   Stationary Bike  5 minutes full revolutions seat 11  Knee/Hip Exercises: Standing   Heel Raises  Both;10 reps    Knee Flexion  Right;10 reps      Knee/Hip Exercises: Supine   Quad Sets  AROM;Left;10 reps    Heel Slides  Right;5 reps    Knee Extension  AROM    Knee Extension Limitations  5    Knee Flexion  AROM    Knee Flexion Limitations  110      Manual Therapy   Manual Therapy  Soft tissue mobilization;Myofascial release    Manual therapy comments  completed seperately from all other skilled interventions    Soft tissue mobilization  to mobilize scar and improve ROM    Myofascial Release  to decrease adhesions               PT Short Term Goals - 06/19/19 1243      PT SHORT TERM GOAL #1   Title  Patient to be compliant with HEP, to be updated PRN    Time  2    Period  Weeks    Status  New    Target Date  07/03/19      PT SHORT TERM GOAL #2   Title  Patient to be independent in edema  management strategies to reduce pain and discomfort related to swelling    Time  2    Period  Weeks    Status  New      PT SHORT TERM GOAL #3   Title  Patient to have gained 10 degrees of surgical knee flexion and extension in order to improve gait mechanics and reduce pain    Time  2    Period  Weeks    Status  New      PT SHORT TERM GOAL #4   Title  Patient to be able to ambulate with equal stance times/step lengths, elimination of antalgic pattern, and pain no more than 2/10 in order improve community access and facilitate return to work    Time  2    Period  Weeks    Status  New        PT Long Term Goals - 06/19/19 1251      PT LONG TERM GOAL #1   Title  Patient to show MMT in all tested muscle groups as being 5/5 in order to improve functional task tolerance and reduce pain    Time  4    Period  Weeks    Status  New    Target Date  07/17/19      PT LONG TERM GOAL #2   Title  Patient to be able to gait train 275ft during 2MWT in order to show improved mobility and ease of community access    Time  4    Period  Weeks    Status  New      PT LONG TERM GOAL #3   Title  Patient to be able to maintain SLS and tandem stance for equal amounts of time in order to show improved functional balance and strength    Time  4    Period  Weeks    Status  New      PT LONG TERM GOAL #4   Title  Patient to be able to tolerate 3 hours of work without increased pain in order to improve functional task tolerance    Time  4    Period  Weeks    Status  New  Plan - 06/21/19 1035    Clinical Impression Statement  reviewed HEP and POC moving forward.  Began treatement with bike to help loosen knee and worked on ambulation using walking pole and heel to toe gait.  Cues also needed to bend knee and keep in upright posturing while gazing forward rather than down. Added stretches including knee flexion and hamstring.  Manual initiated to loosen adhesions, reduce pain and improve  ROM.  AROM achieved 5-110 today (was 10-104 last session).    Personal Factors and Comorbidities  Age;Fitness;Past/Current Experience;Comorbidity 1;Sex;Education;Social Background;Time since onset of injury/illness/exacerbation    Examination-Activity Limitations  Bathing;Locomotion Level;Transfers;Bed Mobility;Sit;Sleep;Squat;Stairs;Stand;Lift    Examination-Participation Restrictions  Church;Yard Work;Cleaning;Driving;Interpersonal Relationship;Laundry;Shop    Stability/Clinical Decision Making  Stable/Uncomplicated    Rehab Potential  Excellent    PT Frequency  3x / week    PT Duration  4 weeks    PT Treatment/Interventions  ADLs/Self Care Home Management;Cryotherapy;Electrical Stimulation;Iontophoresis 4mg /ml Dexamethasone;Moist Heat;Ultrasound;DME Instruction;Neuromuscular re-education;Patient/family education;Manual techniques;Scar mobilization;Passive range of motion;Therapeutic exercise;Therapeutic activities;Functional mobility training;Stair training;Gait training;Balance training;Taping    PT Next Visit Plan  Continue with focus on improving ROM, Gait, balance and functional strength.    PT Home Exercise Plan  Eval: bridges, sit to stand, knee flexion stretch, hospital HEP    Consulted and Agree with Plan of Care  Patient       Patient will benefit from skilled therapeutic intervention in order to improve the following deficits and impairments:  Abnormal gait, Decreased coordination, Decreased range of motion, Difficulty walking, Increased fascial restricitons, Decreased safety awareness, Decreased activity tolerance, Decreased skin integrity, Pain, Decreased balance, Decreased knowledge of use of DME, Improper body mechanics, Decreased mobility, Decreased strength, Increased edema  Visit Diagnosis: 1. Acute pain of right knee   2. Difficulty in walking, not elsewhere classified   3. Localized edema   4. Muscle weakness (generalized)        Problem List Patient Active Problem  List   Diagnosis Date Noted  . Status post total right knee replacement 05/30/2019  . Unilateral primary osteoarthritis, left knee 04/18/2019  . Unilateral primary osteoarthritis, right knee 04/18/2019  . CVA (cerebral vascular accident) ([place]) 08/04/2018  . Stroke ([place]) 08/04/2018  . Chronic back pain 08/04/2018  . Post-operative pain 09/20/2017  . 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 Lumbar stenosis with neurogenic claudication 02/04/2015  . Stiffness of joints, not elsewhere classified, multiple sites 10/27/2012  . Weakness of both legs 10/27/2012  . Difficulty in walking(719.7) 10/27/2012   Teena Irani, PTA/CLT [phone]  Teena Irani 06/21/2019, 10:39 AM  [facility] [place], [place], [address] Phone: [phone]   Fax:  [phone]  Name: [name] MRN: [id number] Date of Birth: February 13, 1946

## 2019-06-22 ENCOUNTER — Other Ambulatory Visit: Payer: Self-pay | Admitting: Orthopedic Surgery

## 2019-06-22 ENCOUNTER — Telehealth: Payer: Self-pay | Admitting: Orthopaedic Surgery

## 2019-06-22 MED ORDER — OXYCODONE HCL 5 MG PO TABS: 5.0000 mg | ORAL_TABLET | ORAL | 0 refills | Status: DC | PRN

## 2019-06-22 NOTE — Telephone Encounter (Signed)
rx sent to [facility]

## 2019-06-22 NOTE — Telephone Encounter (Signed)
Patient called needing Rx refilled (Oxycodone) Patient said she will be out of medication today. Patient uses [facility] in [place]  [place] The number to contact patient is [phone]

## 2019-06-22 NOTE — Telephone Encounter (Signed)
Can you advise for [name] and fill pain medication for patient  She had recent surgery 05/30/19 right knee replacement

## 2019-06-23 ENCOUNTER — Ambulatory Visit (HOSPITAL_COMMUNITY): Payer: Medicare Other | Admitting: Physical Therapy

## 2019-06-23 ENCOUNTER — Encounter (HOSPITAL_COMMUNITY): Payer: Self-pay | Admitting: Physical Therapy

## 2019-06-23 DIAGNOSIS — R600 Localized edema: Secondary | ICD-10-CM | POA: Diagnosis not present

## 2019-06-23 DIAGNOSIS — R262 Difficulty in walking, not elsewhere classified: Secondary | ICD-10-CM | POA: Diagnosis not present

## 2019-06-23 DIAGNOSIS — M6281 Muscle weakness (generalized): Secondary | ICD-10-CM | POA: Diagnosis not present

## 2019-06-23 DIAGNOSIS — M25561 Pain in right knee: Secondary | ICD-10-CM | POA: Diagnosis not present

## 2019-06-23 NOTE — Therapy (Signed)
[facility] [place], [place], [address] Phone: [phone]   Fax:  [phone]  Physical Therapy Treatment  Patient Details  Name: [name] MRN: [id number] Date of Birth: 11/19/1945 Referring Provider (PT): Jean Rosenthal    Encounter Date: 06/23/2019  PT End of Session - 06/23/19 1508    Visit Number  3    Number of Visits  9    Date for PT Re-Evaluation  07/17/19    Authorization Type  [employer]HC Medicare    Authorization Time Period  06/19/2019 to 07/17/2019    Authorization - Visit Number  3    Authorization - Number of Visits  10    PT Start Time  6948    PT Stop Time  1457    PT Time Calculation (min)  32 min    Activity Tolerance  Patient tolerated treatment well    Behavior During Therapy  [facility] for tasks assessed/performed       Past Medical History:  Diagnosis Date  . Arthritis   . Cataracts, bilateral    surgery to remove cataracts - bilateral  . Chronic back pain   . Hypertension   . Stroke ([place])   . Wears dentures     Past Surgical History:  Procedure Laterality Date  . ABDOMINAL HYSTERECTOMY  82  . APPENDECTOMY    . BACK SURGERY  2010  . BACK SURGERY  2015  . BACK SURGERY  06/2016  . CATARACT EXTRACTION W/PHACO Left 01/07/2017   Procedure: CATARACT EXTRACTION PHACO AND INTRAOCULAR LENS PLACEMENT (IOC);  Surgeon: Tonny Branch, MD;  Location: [facility];  Service: Ophthalmology;  Laterality: Left;  left CDE 9.31   . CATARACT EXTRACTION W/PHACO Right 01/25/2017   Procedure: CATARACT EXTRACTION PHACO AND INTRAOCULAR LENS PLACEMENT ([place]) CDE:16.07;  Surgeon: Tonny Branch, MD;  Location: [facility];  Service: Ophthalmology;  Laterality: Right;  right  . FRACTURE SURGERY Right    arm  1974  . LUMBAR WOUND DEBRIDEMENT N/A 09/23/2017   Procedure: Exploration of Lumbar Wound; Repair of Pseudomeningocele;  Surgeon: Newman Pies, MD;  Location: [facility];  Service: Neurosurgery;  Laterality: N/A;  . TONSILLECTOMY    .  TOTAL KNEE ARTHROPLASTY Right 05/30/2019   Procedure: RIGHT TOTAL KNEE ARTHROPLASTY;  Surgeon: Mcarthur Rossetti, MD;  Location: [facility];  Service: Orthopedics;  Laterality: Right;    There were no vitals filed for this visit.  Subjective Assessment - 06/23/19 1425    Subjective  My pain is getting better every day but I was sore after last sessoin.    Patient Stated Goals  I need t oget well so I can get back to work    Currently in Pain?  Yes    Pain Score  2     Pain Location  Knee    Pain Orientation  Right    Pain Descriptors / Indicators  Aching;Tightness    Pain Type  Surgical pain         [facility] PT Assessment - 06/23/19 0001      AROM   Right Knee Extension  5    Right Knee Flexion  113                   [facility] Adult PT Treatment/Exercise - 06/23/19 0001      Knee/Hip Exercises: Stretches   Active Hamstring Stretch  Right;3 reps;30 seconds    Active Hamstring Stretch Limitations  standing 12" step  Knee: Self-Stretch to increase Flexion  Right;10 seconds;Limitations    Knee: Self-Stretch Limitations  10 reps on 12" step    Gastroc Stretch  Both;3 reps;30 seconds      Knee/Hip Exercises: Supine   Short Arc Quad Sets  Right;15 reps    Short Arc Quad Sets Limitations  3 second holds     Heel Slides  Right;10 reps    Heel Slides Limitations  5 second holds       Manual Therapy   Manual Therapy  Soft tissue mobilization;Other (comment)    Manual therapy comments  completed separate of all other skilled services     Soft tissue mobilization  scar mobility all directions     Other Manual Therapy  knee extension and flexion overpressure x10 each direction              PT Education - 06/23/19 1507    Education Details  exercise form, progress with ROM    Person(s) Educated  Patient    Methods  Explanation    Comprehension  Verbalized understanding       PT Short Term Goals - 06/19/19 1243      PT SHORT TERM GOAL #1   Title  Patient to be  compliant with HEP, to be updated PRN    Time  2    Period  Weeks    Status  New    Target Date  07/03/19      PT SHORT TERM GOAL #2   Title  Patient to be independent in edema management strategies to reduce pain and discomfort related to swelling    Time  2    Period  Weeks    Status  New      PT SHORT TERM GOAL #3   Title  Patient to have gained 10 degrees of surgical knee flexion and extension in order to improve gait mechanics and reduce pain    Time  2    Period  Weeks    Status  New      PT SHORT TERM GOAL #4   Title  Patient to be able to ambulate with equal stance times/step lengths, elimination of antalgic pattern, and pain no more than 2/10 in order improve community access and facilitate return to work    Time  2    Period  Weeks    Status  New        PT Long Term Goals - 06/19/19 1251      PT LONG TERM GOAL #1   Title  Patient to show MMT in all tested muscle groups as being 5/5 in order to improve functional task tolerance and reduce pain    Time  4    Period  Weeks    Status  New    Target Date  07/17/19      PT LONG TERM GOAL #2   Title  Patient to be able to gait train 268ft during 2MWT in order to show improved mobility and ease of community access    Time  4    Period  Weeks    Status  New      PT LONG TERM GOAL #3   Title  Patient to be able to maintain SLS and tandem stance for equal amounts of time in order to show improved functional balance and strength    Time  4    Period  Weeks    Status  New  PT LONG TERM GOAL #4   Title  Patient to be able to tolerate 3 hours of work without increased pain in order to improve functional task tolerance    Time  4    Period  Weeks    Status  New            Plan - 06/23/19 1508    Clinical Impression Statement  Patient arrives late today,  thus session limited. Continued with functional stretching and ROM based activities, she continues to demonstrate mild knee stiffness however ROM is  improving rapidly. May benefit from introduction of low grade strengthening next session.    Personal Factors and Comorbidities  Age;Fitness;Past/Current Experience;Comorbidity 1;Sex;Education;Social Background;Time since onset of injury/illness/exacerbation    Examination-Activity Limitations  Bathing;Locomotion Level;Transfers;Bed Mobility;Sit;Sleep;Squat;Stairs;Stand;Lift    Examination-Participation Restrictions  Church;Yard Work;Cleaning;Driving;Interpersonal Relationship;Laundry;Shop    Stability/Clinical Decision Making  Stable/Uncomplicated    Clinical Decision Making  Low    Rehab Potential  Excellent    PT Frequency  3x / week    PT Duration  4 weeks    PT Treatment/Interventions  ADLs/Self Care Home Management;Cryotherapy;Electrical Stimulation;Iontophoresis 4mg /ml Dexamethasone;Moist Heat;Ultrasound;DME Instruction;Neuromuscular re-education;Patient/family education;Manual techniques;Scar mobilization;Passive range of motion;Therapeutic exercise;Therapeutic activities;Functional mobility training;Stair training;Gait training;Balance training;Taping    PT Next Visit Plan  Introduce low grade strengthening. Continue with focus on improving ROM, Gait, balance and functional strength.    PT Home Exercise Plan  Eval: bridges, sit to stand, knee flexion stretch, hospital HEP    Consulted and Agree with Plan of Care  Patient       Patient will benefit from skilled therapeutic intervention in order to improve the following deficits and impairments:  Abnormal gait, Decreased coordination, Decreased range of motion, Difficulty walking, Increased fascial restricitons, Decreased safety awareness, Decreased activity tolerance, Decreased skin integrity, Pain, Decreased balance, Decreased knowledge of use of DME, Improper body mechanics, Decreased mobility, Decreased strength, Increased edema  Visit Diagnosis: 1. Acute pain of right knee   2. Difficulty in walking, not elsewhere classified   3.  Localized edema   4. Muscle weakness (generalized)        Problem List Patient Active Problem List   Diagnosis Date Noted  . Status post total right knee replacement 05/30/2019  . Unilateral primary osteoarthritis, left knee 04/18/2019  . Unilateral primary osteoarthritis, right knee 04/18/2019  . CVA (cerebral vascular accident) ([place]) 08/04/2018  . Stroke ([place]) 08/04/2018  . Chronic back pain 08/04/2018  . Post-operative pain 09/20/2017  . 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 Lumbar stenosis with neurogenic claudication 02/04/2015  . Stiffness of joints, not elsewhere classified, multiple sites 10/27/2012  . Weakness of both legs 10/27/2012  . Difficulty in walking(719.7) 10/27/2012    Deniece Ree PT, DPT, CBIS  Supplemental Physical Therapist [facility]    Pager [phone] Acute Rehab Office [facility] [address] [place], [place], [address] Phone: [phone]   Fax:  [phone]  Name: [name] MRN: [id number] Date of Birth: [birth year]

## 2019-06-26 ENCOUNTER — Encounter (HOSPITAL_COMMUNITY): Payer: Self-pay | Admitting: Physical Therapy

## 2019-06-26 ENCOUNTER — Ambulatory Visit (HOSPITAL_COMMUNITY): Payer: Medicare Other | Admitting: Physical Therapy

## 2019-06-26 ENCOUNTER — Other Ambulatory Visit: Payer: Self-pay

## 2019-06-26 DIAGNOSIS — M6281 Muscle weakness (generalized): Secondary | ICD-10-CM

## 2019-06-26 DIAGNOSIS — M25561 Pain in right knee: Secondary | ICD-10-CM | POA: Diagnosis not present

## 2019-06-26 DIAGNOSIS — R262 Difficulty in walking, not elsewhere classified: Secondary | ICD-10-CM

## 2019-06-26 DIAGNOSIS — R600 Localized edema: Secondary | ICD-10-CM

## 2019-06-26 NOTE — Therapy (Signed)
[facility] [place], [place], [address] Phone: [phone]   Fax:  [phone]  Physical Therapy Treatment  Patient Details  Name: [name] MRN: [id number] Date of Birth: 20-Nov-1945 Referring Provider (PT): Jean Rosenthal    Encounter Date: 06/26/2019  PT End of Session - 06/26/19 1404    Visit Number  4    Number of Visits  9    Date for PT Re-Evaluation  07/17/19    Authorization Type  [employer] Medicare    Authorization Time Period  06/19/2019 to 07/17/2019    Authorization - Visit Number  4    Authorization - Number of Visits  10    PT Start Time  3500    PT Stop Time  1356    PT Time Calculation (min)  43 min       Past Medical History:  Diagnosis Date  . Arthritis   . Cataracts, bilateral    surgery to remove cataracts - bilateral  . Chronic back pain   . Hypertension   . Stroke ([place])   . Wears dentures     Past Surgical History:  Procedure Laterality Date  . ABDOMINAL HYSTERECTOMY  82  . APPENDECTOMY    . BACK SURGERY  2010  . BACK SURGERY  2015  . BACK SURGERY  06/2016  . CATARACT EXTRACTION W/PHACO Left 01/07/2017   Procedure: CATARACT EXTRACTION PHACO AND INTRAOCULAR LENS PLACEMENT (IOC);  Surgeon: Tonny Branch, MD;  Location: [facility];  Service: Ophthalmology;  Laterality: Left;  left CDE 9.31   . CATARACT EXTRACTION W/PHACO Right 01/25/2017   Procedure: CATARACT EXTRACTION PHACO AND INTRAOCULAR LENS PLACEMENT ([facility]) CDE:16.07;  Surgeon: Tonny Branch, MD;  Location: [facility];  Service: Ophthalmology;  Laterality: Right;  right  . FRACTURE SURGERY Right    arm  1974  . LUMBAR WOUND DEBRIDEMENT N/A 09/23/2017   Procedure: Exploration of Lumbar Wound; Repair of Pseudomeningocele;  Surgeon: Newman Pies, MD;  Location: [facility];  Service: Neurosurgery;  Laterality: N/A;  . TONSILLECTOMY    . TOTAL KNEE ARTHROPLASTY Right 05/30/2019   Procedure: RIGHT TOTAL KNEE ARTHROPLASTY;  Surgeon: Mcarthur Rossetti, MD;  Location: [facility];  Service: Orthopedics;  Laterality: Right;    There were no vitals filed for this visit.  Subjective Assessment - 06/26/19 1315    Subjective  My ankle is tired today but the rest of me is ok. I have some numbness in my leg.    Patient Stated Goals  I need t oget well so I can get back to work    Currently in Pain?  Yes    Pain Score  2     Pain Location  Knee    Pain Orientation  Right    Pain Descriptors / Indicators  Aching    Pain Type  Surgical pain    Pain Radiating Towards  none    Pain Onset  1 to 4 weeks ago    Pain Frequency  Constant    Aggravating Factors   standing too long    Pain Relieving Factors  ice and heat    Effect of Pain on Daily Activities  severe                       [facility] Adult PT Treatment/Exercise - 06/26/19 0001      Knee/Hip Exercises: Stretches   Active Hamstring Stretch  Right;3 reps;30 seconds    Active  Hamstring Stretch Limitations  standing 12" step    Knee: Self-Stretch to increase Flexion  Right;10 seconds;Limitations    Knee: Self-Stretch Limitations  10 reps on 12" step    Gastroc Stretch  Both;3 reps;30 seconds      Knee/Hip Exercises: Standing   Heel Raises  Both;1 set;10 reps    Heel Raises Limitations  heel and toe     Lateral Step Up  Right;1 set;10 reps;Hand Hold: 1    Lateral Step Up Limitations  2 inch box     Forward Step Up  Right;1 set;10 reps;Hand Hold: 1    Forward Step Up Limitations  2 inch box     Step Down  Both;1 set;10 reps;Hand Hold: 1    Step Down Limitations  2 inch box     Gait Training  gait training 233ft x1 walking cane, cues for heel-toe pattern       Knee/Hip Exercises: Supine   Short Arc Quad Sets  Right;1 set;15 reps    Short Arc Quad Sets Limitations  3 scond holds     Bridges  Both;1 set;10 reps    Straight Leg Raises  Right;1 set;5 reps    Straight Leg Raises Limitations  quad set       Manual Therapy   Manual Therapy  Joint mobilization;Soft tissue  mobilization    Manual therapy comments  completed separate of all other skilled services     Joint Mobilization  patella mobility all directions     Soft tissue mobilization  scar mobility all directions           Balance Exercises - 06/26/19 1351      Balance Exercises: Standing   Standing Eyes Opened  Narrow base of support (BOS);4 reps;10 secs   black foam pad    Tandem Stance  Eyes open;4 reps;10 secs   solid surface        PT Education - 06/26/19 1404    Education Details  exercise form and purpose    Person(s) Educated  Patient    Methods  Explanation    Comprehension  Verbalized understanding       PT Short Term Goals - 06/19/19 1243      PT SHORT TERM GOAL #1   Title  Patient to be compliant with HEP, to be updated PRN    Time  2    Period  Weeks    Status  New    Target Date  07/03/19      PT SHORT TERM GOAL #2   Title  Patient to be independent in edema management strategies to reduce pain and discomfort related to swelling    Time  2    Period  Weeks    Status  New      PT SHORT TERM GOAL #3   Title  Patient to have gained 10 degrees of surgical knee flexion and extension in order to improve gait mechanics and reduce pain    Time  2    Period  Weeks    Status  New      PT SHORT TERM GOAL #4   Title  Patient to be able to ambulate with equal stance times/step lengths, elimination of antalgic pattern, and pain no more than 2/10 in order improve community access and facilitate return to work    Time  2    Period  Weeks    Status  New        PT Long Term  Goals - 06/19/19 1251      PT LONG TERM GOAL #1   Title  Patient to show MMT in all tested muscle groups as being 5/5 in order to improve functional task tolerance and reduce pain    Time  4    Period  Weeks    Status  New    Target Date  07/17/19      PT LONG TERM GOAL #2   Title  Patient to be able to gait train 257ft during 2MWT in order to show improved mobility and ease of community  access    Time  4    Period  Weeks    Status  New      PT LONG TERM GOAL #3   Title  Patient to be able to maintain SLS and tandem stance for equal amounts of time in order to show improved functional balance and strength    Time  4    Period  Weeks    Status  New      PT LONG TERM GOAL #4   Title  Patient to be able to tolerate 3 hours of work without increased pain in order to improve functional task tolerance    Time  4    Period  Weeks    Status  New            Plan - 06/26/19 1404    Clinical Impression Statement  Began session with table activities for soft tissue mobility and general proximal muscle strength, continued with functional stretches for knee ROM, and introduced low grade strengthening in weight bearing positions today. Used lower reps/lower difficulty tasks in standing due to general fatigue this afternoon. Noted very impaired eccentric control and weakness in R quad during step downs, will continue to require skilled intervention.  Continues to progress generally as expected in course of care following TKR procedure.    Personal Factors and Comorbidities  Age;Fitness;Past/Current Experience;Comorbidity 1;Sex;Education;Social Background;Time since onset of injury/illness/exacerbation    Examination-Activity Limitations  Bathing;Locomotion Level;Transfers;Bed Mobility;Sit;Sleep;Squat;Stairs;Stand;Lift    Examination-Participation Restrictions  Church;Yard Work;Cleaning;Driving;Interpersonal Relationship;Laundry;Shop    Stability/Clinical Decision Making  Stable/Uncomplicated    Clinical Decision Making  Low    Rehab Potential  Excellent    PT Frequency  3x / week    PT Duration  4 weeks    PT Treatment/Interventions  ADLs/Self Care Home Management;Cryotherapy;Electrical Stimulation;Iontophoresis 4mg /ml Dexamethasone;Moist Heat;Ultrasound;DME Instruction;Neuromuscular re-education;Patient/family education;Manual techniques;Scar mobilization;Passive range of  motion;Therapeutic exercise;Therapeutic activities;Functional mobility training;Stair training;Gait training;Balance training;Taping    PT Next Visit Plan  continue low grade strengthening, keep working on eccentric strength. ROM, balance, gait/stair training. Needs HEP update next session.    PT Home Exercise Plan  Eval: bridges, sit to stand, knee flexion stretch, hospital HEP    Consulted and Agree with Plan of Care  Patient       Patient will benefit from skilled therapeutic intervention in order to improve the following deficits and impairments:  Abnormal gait, Decreased coordination, Decreased range of motion, Difficulty walking, Increased fascial restricitons, Decreased safety awareness, Decreased activity tolerance, Decreased skin integrity, Pain, Decreased balance, Decreased knowledge of use of DME, Improper body mechanics, Decreased mobility, Decreased strength, Increased edema  Visit Diagnosis: 1. Acute pain of right knee   2. Difficulty in walking, not elsewhere classified   3. Localized edema   4. Muscle weakness (generalized)        Problem List Patient Active Problem List   Diagnosis Date Noted  . Status post  total right knee replacement 05/30/2019  . Unilateral primary osteoarthritis, left knee 04/18/2019  . Unilateral primary osteoarthritis, right knee 04/18/2019  . CVA (cerebral vascular accident) ([facility]) 08/04/2018  . Stroke ([facility]) 08/04/2018  . Chronic back pain 08/04/2018  . Post-operative pain 09/20/2017  . 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 Lumbar stenosis with neurogenic claudication 02/04/2015  . Stiffness of joints, not elsewhere classified, multiple sites 10/27/2012  . Weakness of both legs 10/27/2012  . Difficulty in walking(719.7) 10/27/2012    Deniece Ree PT, DPT, CBIS  Supplemental Physical Therapist [facility]    Pager [phone] Acute Rehab Office [facility] [address] [place], [place], [address] Phone: [phone]   Fax:  [phone]  Name: [name] MRN: [id number] Date of Birth: March 10, 1946

## 2019-06-28 ENCOUNTER — Ambulatory Visit (HOSPITAL_COMMUNITY): Payer: Medicare Other | Admitting: Physical Therapy

## 2019-06-28 ENCOUNTER — Other Ambulatory Visit: Payer: Self-pay

## 2019-06-28 ENCOUNTER — Encounter (HOSPITAL_COMMUNITY): Payer: Self-pay | Admitting: Physical Therapy

## 2019-06-28 DIAGNOSIS — M25561 Pain in right knee: Secondary | ICD-10-CM | POA: Diagnosis not present

## 2019-06-28 DIAGNOSIS — M6281 Muscle weakness (generalized): Secondary | ICD-10-CM

## 2019-06-28 DIAGNOSIS — R600 Localized edema: Secondary | ICD-10-CM

## 2019-06-28 DIAGNOSIS — R262 Difficulty in walking, not elsewhere classified: Secondary | ICD-10-CM

## 2019-06-28 NOTE — Therapy (Signed)
[name], [place], [address] Phone: [phone]   Fax:  [phone]  Physical Therapy Treatment  Patient Details  Name: [name] MRN: [id number] Date of Birth: [birth year] Referring Provider (PT): Jean Rosenthal    Encounter Date: 06/28/2019  PT End of Session - 06/28/19 0901    Visit Number  5    Number of Visits  9    Date for PT Re-Evaluation  07/17/19    Authorization Type  [employer] Medicare    Authorization Time Period  06/19/2019 to 07/17/2019    Authorization - Visit Number  5    Authorization - Number of Visits  10    PT Start Time  4174    PT Stop Time  0814    PT Time Calculation (min)  40 min       Past Medical History:  Diagnosis Date  . Arthritis   . Cataracts, bilateral    surgery to remove cataracts - bilateral  . Chronic back pain   . Hypertension   . Stroke ([place])   . Wears dentures     Past Surgical History:  Procedure Laterality Date  . ABDOMINAL HYSTERECTOMY  82  . APPENDECTOMY    . BACK SURGERY  2010  . BACK SURGERY  2015  . BACK SURGERY  06/2016  . CATARACT EXTRACTION W/PHACO Left 01/07/2017   Procedure: CATARACT EXTRACTION PHACO AND INTRAOCULAR LENS PLACEMENT (IOC);  Surgeon: Tonny Branch, MD;  Location: [facility];  Service: Ophthalmology;  Laterality: Left;  left CDE 9.31   . CATARACT EXTRACTION W/PHACO Right 01/25/2017   Procedure: CATARACT EXTRACTION PHACO AND INTRAOCULAR LENS PLACEMENT ([facility]) CDE:16.07;  Surgeon: Tonny Branch, MD;  Location: [facility];  Service: Ophthalmology;  Laterality: Right;  right  . FRACTURE SURGERY Right    arm  1974  . LUMBAR WOUND DEBRIDEMENT N/A 09/23/2017   Procedure: Exploration of Lumbar Wound; Repair of Pseudomeningocele;  Surgeon: Newman Pies, MD;  Location: [facility];  Service: Neurosurgery;  Laterality: N/A;  . TONSILLECTOMY    . TOTAL KNEE ARTHROPLASTY Right 05/30/2019   Procedure: RIGHT TOTAL KNEE ARTHROPLASTY;  Surgeon: Mcarthur Rossetti, MD;  Location: [facility];  Service: Orthopedics;  Laterality: Right;    There were no vitals filed for this visit.  Subjective Assessment - 06/28/19 0821    Subjective  I've been sore since last session, my knee feels stiff and like its trying to lock this morning.    Patient Stated Goals  I need t oget well so I can get back to work    Currently in Pain?  Yes    Pain Score  3     Pain Location  Knee    Pain Orientation  Right    Pain Descriptors / Indicators  Aching                       [facility] Adult PT Treatment/Exercise - 06/28/19 0001      Knee/Hip Exercises: Standing   Heel Raises  Both;1 set;15 reps    Heel Raises Limitations  heel and toe     Knee Flexion  Right;1 set;10 reps    Terminal Knee Extension  Right;1 set;10 reps    Terminal Knee Extension Limitations  3 second holds     Lateral Step Up  Right;1 set;10 reps;Hand Hold: 1;Step Height: 2"    Forward Step Up  Right;1 set;10 reps;Hand Hold: 1;Step Height: 2"  Gait Training  gait training 218ft, cues for heel-toe and cane use/sequencing      Knee/Hip Exercises: Supine   Short Arc Quad Sets  Right;1 set;15 reps    Short Arc Quad Sets Limitations  5 second holds     Heel Slides  Both;1 set;15 reps    Heel Slides Limitations  5 second holds     Bridges  Both;1 set;10 reps    Straight Leg Raises  Right;1 set;10 reps    Straight Leg Raises Limitations  quad set       Manual Therapy   Manual Therapy  Edema management;Soft tissue mobilization    Manual therapy comments  completed separate of all other skilled services     Edema Management  retrograde massage with LE elevated     Joint Mobilization  patella mobility all directions              PT Education - 06/28/19 0901    Education Details  exercise form, DOMS    Person(s) Educated  Patient    Methods  Explanation    Comprehension  Verbalized understanding;Need further instruction       PT Short Term Goals - 06/19/19 1243      PT SHORT  TERM GOAL #1   Title  Patient to be compliant with HEP, to be updated PRN    Time  2    Period  Weeks    Status  New    Target Date  07/03/19      PT SHORT TERM GOAL #2   Title  Patient to be independent in edema management strategies to reduce pain and discomfort related to swelling    Time  2    Period  Weeks    Status  New      PT SHORT TERM GOAL #3   Title  Patient to have gained 10 degrees of surgical knee flexion and extension in order to improve gait mechanics and reduce pain    Time  2    Period  Weeks    Status  New      PT SHORT TERM GOAL #4   Title  Patient to be able to ambulate with equal stance times/step lengths, elimination of antalgic pattern, and pain no more than 2/10 in order improve community access and facilitate return to work    Time  2    Period  Weeks    Status  New        PT Long Term Goals - 06/19/19 1251      PT LONG TERM GOAL #1   Title  Patient to show MMT in all tested muscle groups as being 5/5 in order to improve functional task tolerance and reduce pain    Time  4    Period  Weeks    Status  New    Target Date  07/17/19      PT LONG TERM GOAL #2   Title  Patient to be able to gait train 228ft during 2MWT in order to show improved mobility and ease of community access    Time  4    Period  Weeks    Status  New      PT LONG TERM GOAL #3   Title  Patient to be able to maintain SLS and tandem stance for equal amounts of time in order to show improved functional balance and strength    Time  4    Period  Weeks  Status  New      PT LONG TERM GOAL #4   Title  Patient to be able to tolerate 3 hours of work without increased pain in order to improve functional task tolerance    Time  4    Period  Weeks    Status  New            Plan - 06/28/19 0901    Clinical Impression Statement  Ms. Hammad arrives today reporting she is quite sore, her knee is stiff and feels like its locked up this morning. Started session with manual  techniques to reduce pain and edema, then slowly progressed session with gentle supine exercises and progressing to standing tasks and activities as tolerated. Increased focus on gait training this session as well to improve general mobility and reduce pain/stress on knee with gait.    Personal Factors and Comorbidities  Age;Fitness;Past/Current Experience;Comorbidity 1;Sex;Education;Social Background;Time since onset of injury/illness/exacerbation    Examination-Activity Limitations  Bathing;Locomotion Level;Transfers;Bed Mobility;Sit;Sleep;Squat;Stairs;Stand;Lift    Examination-Participation Restrictions  Church;Yard Work;Cleaning;Driving;Interpersonal Relationship;Laundry;Shop    Stability/Clinical Decision Making  Stable/Uncomplicated    Clinical Decision Making  Low    Rehab Potential  Excellent    PT Frequency  2x / week    PT Duration  4 weeks    PT Treatment/Interventions  ADLs/Self Care Home Management;Cryotherapy;Electrical Stimulation;Iontophoresis 4mg /ml Dexamethasone;Moist Heat;Ultrasound;DME Instruction;Neuromuscular re-education;Patient/family education;Manual techniques;Scar mobilization;Passive range of motion;Therapeutic exercise;Therapeutic activities;Functional mobility training;Stair training;Gait training;Balance training;Taping    PT Next Visit Plan  continue low grade strengthening, keep working on eccentric strength. ROM, balance, gait/stair training. Needs HEP update next session.    PT Home Exercise Plan  Eval: bridges, sit to stand, knee flexion stretch, hospital HEP    Consulted and Agree with Plan of Care  Patient       Patient will benefit from skilled therapeutic intervention in order to improve the following deficits and impairments:  Abnormal gait, Decreased coordination, Decreased range of motion, Difficulty walking, Increased fascial restricitons, Decreased safety awareness, Decreased activity tolerance, Decreased skin integrity, Pain, Decreased balance, Decreased  knowledge of use of DME, Improper body mechanics, Decreased mobility, Decreased strength, Increased edema  Visit Diagnosis: 1. Acute pain of right knee   2. Difficulty in walking, not elsewhere classified   3. Localized edema   4. Muscle weakness (generalized)        Problem List Patient Active Problem List   Diagnosis Date Noted  . Status post total right knee replacement 05/30/2019  . Unilateral primary osteoarthritis, left knee 04/18/2019  . Unilateral primary osteoarthritis, right knee 04/18/2019  . CVA (cerebral vascular accident) ([place]) 08/04/2018  . Stroke ([place]) 08/04/2018  . Chronic back pain 08/04/2018  . Post-operative pain 09/20/2017  . 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 Lumbar stenosis with neurogenic claudication 02/04/2015  . Stiffness of joints, not elsewhere classified, multiple sites 10/27/2012  . Weakness of both legs 10/27/2012  . Difficulty in walking(719.7) 10/27/2012    Deniece Ree PT, DPT, CBIS  Supplemental Physical Therapist [facility]    Pager [phone] Acute Rehab Office [facility] [address] [place], [place], [address] Phone: [phone]   Fax:  [phone]  Name: JEN BENEDICT MRN: [id number] Date of Birth: [birth year]

## 2019-06-30 ENCOUNTER — Encounter (HOSPITAL_COMMUNITY): Payer: Self-pay

## 2019-06-30 ENCOUNTER — Ambulatory Visit (HOSPITAL_COMMUNITY): Payer: Medicare Other

## 2019-06-30 ENCOUNTER — Other Ambulatory Visit: Payer: Self-pay

## 2019-06-30 DIAGNOSIS — M6281 Muscle weakness (generalized): Secondary | ICD-10-CM

## 2019-06-30 DIAGNOSIS — M25561 Pain in right knee: Secondary | ICD-10-CM | POA: Diagnosis not present

## 2019-06-30 DIAGNOSIS — R262 Difficulty in walking, not elsewhere classified: Secondary | ICD-10-CM

## 2019-06-30 DIAGNOSIS — R600 Localized edema: Secondary | ICD-10-CM | POA: Diagnosis not present

## 2019-06-30 NOTE — Therapy (Signed)
[facility] [address] [place], [place], [address] Phone: [phone]   Fax:  [phone]  Physical Therapy Treatment  Patient Details  Name: [name] MRN: [id number] Date of Birth: 1946-06-28 Referring Provider (PT): Jean Rosenthal    Encounter Date: 06/30/2019  PT End of Session - 06/30/19 1011    Visit Number  6    Number of Visits  9    Date for PT Re-Evaluation  07/17/19    Authorization Type  [employer] Medicare    Authorization Time Period  06/19/2019 to 07/17/2019    Authorization - Visit Number  6    Authorization - Number of Visits  10    PT Start Time  0917    PT Stop Time  1006    PT Time Calculation (min)  49 min    Activity Tolerance  Patient tolerated treatment well    Behavior During Therapy  [facility] for tasks assessed/performed       Past Medical History:  Diagnosis Date  . Arthritis   . Cataracts, bilateral    surgery to remove cataracts - bilateral  . Chronic back pain   . Hypertension   . Stroke ([place])   . Wears dentures     Past Surgical History:  Procedure Laterality Date  . ABDOMINAL HYSTERECTOMY  82  . APPENDECTOMY    . BACK SURGERY  2010  . BACK SURGERY  2015  . BACK SURGERY  06/2016  . CATARACT EXTRACTION W/PHACO Left 01/07/2017   Procedure: CATARACT EXTRACTION PHACO AND INTRAOCULAR LENS PLACEMENT (IOC);  Surgeon: Tonny Branch, MD;  Location: [facility];  Service: Ophthalmology;  Laterality: Left;  left CDE 9.31   . CATARACT EXTRACTION W/PHACO Right 01/25/2017   Procedure: CATARACT EXTRACTION PHACO AND INTRAOCULAR LENS PLACEMENT ([place]) CDE:16.07;  Surgeon: Tonny Branch, MD;  Location: [facility];  Service: Ophthalmology;  Laterality: Right;  right  . FRACTURE SURGERY Right    arm  1974  . LUMBAR WOUND DEBRIDEMENT N/A 09/23/2017   Procedure: Exploration of Lumbar Wound; Repair of Pseudomeningocele;  Surgeon: Newman Pies, MD;  Location: [facility];  Service: Neurosurgery;  Laterality: N/A;  . TONSILLECTOMY     . TOTAL KNEE ARTHROPLASTY Right 05/30/2019   Procedure: RIGHT TOTAL KNEE ARTHROPLASTY;  Surgeon: Mcarthur Rossetti, MD;  Location: [facility];  Service: Orthopedics;  Laterality: Right;    There were no vitals filed for this visit.  Subjective Assessment - 06/30/19 0916    Subjective  Pt reports knee is stiff today, current pain scale 2/10.  Reports compliance iwht HEP daily.    Patient Stated Goals  I need t oget well so I can get back to work    Currently in Pain?  Yes    Pain Score  2     Pain Location  Knee    Pain Orientation  Right    Pain Descriptors / Indicators  Tightness   stiffness   Pain Type  Surgical pain    Pain Onset  1 to 4 weeks ago    Pain Frequency  Constant    Aggravating Factors   standing too long    Pain Relieving Factors  ice and heat    Effect of Pain on Daily Activities  severe         [facility] PT Assessment - 06/30/19 0001      Assessment   Medical Diagnosis  s/p R TKR     Referring Provider (PT)  Jean Rosenthal  Onset Date/Surgical Date  06/02/19    Next MD Visit  Dr. Ninfa [facility] 07/11/19    Prior Therapy  yes                    [facility] Adult PT Treatment/Exercise - 06/30/19 0001      Exercises   Exercises  Knee/Hip      Knee/Hip Exercises: Stretches   Knee: Self-Stretch to increase Flexion  Right;10 seconds;Limitations    Knee: Self-Stretch Limitations  10 reps on 12" step    Gastroc Stretch  Both;3 reps;30 seconds    Gastroc Stretch Limitations  slant board      Knee/Hip Exercises: Standing   Heel Raises  Both;1 set;15 reps    Heel Raises Limitations  heel and toe     Knee Flexion  Right;1 set;10 reps    Terminal Knee Extension  Right;1 set;10 reps    Theraband Level (Terminal Knee Extension)  Level 2 (Red)    Terminal Knee Extension Limitations  3 second holds     Lateral Step Up  Right;10 reps;Hand Hold: 2;Step Height: 4"    Forward Step Up  Right;10 reps;Hand Hold: 1;Step Height: 4"    Step Down  Both;1 set;10  reps;Hand Hold: 1;Step Height: 4"    Gait Training  gait training 280ft, cues for heel-toe and cane use/sequencing;     Other Standing Knee Exercises  forward/backward gait training first in // bars then long hallway, cueing for arm swing and heel/toe mechanics      Knee/Hip Exercises: Seated   Sit to Sand  10 reps;without UE support   eccentric control     Knee/Hip Exercises: Supine   Quad Sets  4 sets    Short Arc Quad Sets  Right;1 set;15 reps    Short Arc Quad Sets Limitations  5" holds    Knee Extension  AROM    Knee Extension Limitations  4    Knee Flexion  AROM    Knee Flexion Limitations  111      Manual Therapy   Manual Therapy  Edema management;Soft tissue mobilization    Manual therapy comments  completed separate of all other skilled services     Edema Management  retrograde massage with LE elevated     Joint Mobilization  patella mobility all directions                PT Short Term Goals - 06/19/19 1243      PT SHORT TERM GOAL #1   Title  Patient to be compliant with HEP, to be updated PRN    Time  2    Period  Weeks    Status  New    Target Date  07/03/19      PT SHORT TERM GOAL #2   Title  Patient to be independent in edema management strategies to reduce pain and discomfort related to swelling    Time  2    Period  Weeks    Status  New      PT SHORT TERM GOAL #3   Title  Patient to have gained 10 degrees of surgical knee flexion and extension in order to improve gait mechanics and reduce pain    Time  2    Period  Weeks    Status  New      PT SHORT TERM GOAL #4   Title  Patient to be able to ambulate with equal stance times/step lengths, elimination of antalgic pattern,  and pain no more than 2/10 in order improve community access and facilitate return to work    Time  2    Period  Weeks    Status  New        PT Long Term Goals - 06/19/19 1251      PT LONG TERM GOAL #1   Title  Patient to show MMT in all tested muscle groups as being  5/5 in order to improve functional task tolerance and reduce pain    Time  4    Period  Weeks    Status  New    Target Date  07/17/19      PT LONG TERM GOAL #2   Title  Patient to be able to gait train 219ft during 2MWT in order to show improved mobility and ease of community access    Time  4    Period  Weeks    Status  New      PT LONG TERM GOAL #3   Title  Patient to be able to maintain SLS and tandem stance for equal amounts of time in order to show improved functional balance and strength    Time  4    Period  Weeks    Status  New      PT LONG TERM GOAL #4   Title  Patient to be able to tolerate 3 hours of work without increased pain in order to improve functional task tolerance    Time  4    Period  Weeks    Status  New            Plan - 06/30/19 1248    Clinical Impression Statement  Session focus on knee mobility, gait training and strengthening.  Noted tendency to circumduct during gait, gait training to improve knee flexion and heel to toe sequence with good follow through noted at EOS.  Pt encouraged to begin walking program as addition to HEP, not set on time or distance but awareness of mechanics.  Pt able to progress to 4in step ups for functional strengthening with some difficutly wiht step downs due to weak eccentric control.  EOS with manual for edema control.  AROM improved 4-111 degrees.  Added STS and SAQ to HEP for funcitonal gluteal and quad strengthening, printout given with verbalized understanding.    Personal Factors and Comorbidities  Age;Fitness;Past/Current Experience;Comorbidity 1;Sex;Education;Social Background;Time since onset of injury/illness/exacerbation    Examination-Activity Limitations  Bathing;Locomotion Level;Transfers;Bed Mobility;Sit;Sleep;Squat;Stairs;Stand;Lift    Examination-Participation Restrictions  Church;Yard Work;Cleaning;Driving;Interpersonal Relationship;Laundry;Shop    Stability/Clinical Decision Making  Stable/Uncomplicated     Clinical Decision Making  Low    Rehab Potential  Excellent    PT Frequency  2x / week    PT Duration  4 weeks    PT Treatment/Interventions  ADLs/Self Care Home Management;Cryotherapy;Electrical Stimulation;Iontophoresis 4mg /ml Dexamethasone;Moist Heat;Ultrasound;DME Instruction;Neuromuscular re-education;Patient/family education;Manual techniques;Scar mobilization;Passive range of motion;Therapeutic exercise;Therapeutic activities;Functional mobility training;Stair training;Gait training;Balance training;Taping    PT Next Visit Plan  continue low grade strengthening, keep working on eccentric strength. ROM, balance, gait/stair training. Needs HEP update next session.    PT Home Exercise Plan  Eval: bridges, sit to stand, knee flexion stretch, hospital HEP; 06/30/19: SAQ, STS and walking program.       Patient will benefit from skilled therapeutic intervention in order to improve the following deficits and impairments:  Abnormal gait, Decreased coordination, Decreased range of motion, Difficulty walking, Increased fascial restricitons, Decreased safety awareness, Decreased activity tolerance, Decreased skin integrity,  Pain, Decreased balance, Decreased knowledge of use of DME, Improper body mechanics, Decreased mobility, Decreased strength, Increased edema  Visit Diagnosis: 1. Localized edema   2. Muscle weakness (generalized)   3. Difficulty in walking, not elsewhere classified   4. Acute pain of right knee        Problem List Patient Active Problem List   Diagnosis Date Noted  . Status post total right knee replacement 05/30/2019  . Unilateral primary osteoarthritis, left knee 04/18/2019  . Unilateral primary osteoarthritis, right knee 04/18/2019  . CVA (cerebral vascular accident) ([place]) 08/04/2018  . Stroke ([place]) 08/04/2018  . Chronic back pain 08/04/2018  . Post-operative pain 09/20/2017  . 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 Lumbar stenosis with neurogenic  claudication 02/04/2015  . Stiffness of joints, not elsewhere classified, multiple sites 10/27/2012  . Weakness of both legs 10/27/2012  . Difficulty in walking(719.7) 10/27/2012   Ihor Austin, [place]; [place]  Aldona Lento 06/30/2019, 1:03 PM  [facility] [address] [place], [place], [address] Phone: [phone]   Fax:  [phone]  Name: ELANDRA POWELL MRN: [id number] Date of Birth: [birth year]

## 2019-06-30 NOTE — Patient Instructions (Addendum)
Functional Quadriceps: Sit to Stand    Sit on edge of chair, feet flat on floor. Stand upright, extending knees fully. Repeat 10 times per set. Do 1 sets per session. Do 2 sessions per day.  http://orth.exer.us/735   Copyright  VHI. All rights reserved.   Short Arc Knee Extension    Place bolster under left knee so it is bent slightly. Squeeze pelvic floor and hold. Straighten knee.  Hold for 5 seconds.  Repeat 15 times. Do 2 times a day. Repeat with other leg.  Copyright  VHI. All rights reserved.   Single Leg Balance: Eyes Open    Stand on right leg with eyes open. Hold 30 seconds. 5 reps 2 times per day.  http://ggbe.exer.us/5   Copyright  VHI. All rights reserved.

## 2019-07-03 ENCOUNTER — Other Ambulatory Visit: Payer: Self-pay

## 2019-07-03 ENCOUNTER — Ambulatory Visit (HOSPITAL_COMMUNITY): Payer: Medicare Other | Admitting: Physical Therapy

## 2019-07-03 DIAGNOSIS — R600 Localized edema: Secondary | ICD-10-CM | POA: Diagnosis not present

## 2019-07-03 DIAGNOSIS — R262 Difficulty in walking, not elsewhere classified: Secondary | ICD-10-CM | POA: Diagnosis not present

## 2019-07-03 DIAGNOSIS — M6281 Muscle weakness (generalized): Secondary | ICD-10-CM

## 2019-07-03 DIAGNOSIS — M25561 Pain in right knee: Secondary | ICD-10-CM | POA: Diagnosis not present

## 2019-07-03 NOTE — Therapy (Signed)
[facility] [address] [place], [place], [address] Phone: [phone]   Fax:  [phone]  Physical Therapy Treatment  Patient Details  Name: [name] MRN: [id number] Date of Birth: [birth year] Referring Provider (PT): Jean Rosenthal    Encounter Date: 07/03/2019  PT End of Session - 07/03/19 0931    Visit Number  7    Number of Visits  9    Date for PT Re-Evaluation  07/17/19    Authorization Type  [employer] Medicare    Authorization Time Period  06/19/2019 to 07/17/2019    Authorization - Visit Number  7    Authorization - Number of Visits  10    PT Start Time  0835    PT Stop Time  0918    PT Time Calculation (min)  43 min    Activity Tolerance  Patient tolerated treatment well    Behavior During Therapy  [facility] for tasks assessed/performed       Past Medical History:  Diagnosis Date  . Arthritis   . Cataracts, bilateral    surgery to remove cataracts - bilateral  . Chronic back pain   . Hypertension   . Stroke ([place])   . Wears dentures     Past Surgical History:  Procedure Laterality Date  . ABDOMINAL HYSTERECTOMY  82  . APPENDECTOMY    . BACK SURGERY  2010  . BACK SURGERY  2015  . BACK SURGERY  06/2016  . CATARACT EXTRACTION W/PHACO Left 01/07/2017   Procedure: CATARACT EXTRACTION PHACO AND INTRAOCULAR LENS PLACEMENT (IOC);  Surgeon: Tonny Branch, MD;  Location: [facility];  Service: Ophthalmology;  Laterality: Left;  left CDE 9.31   . CATARACT EXTRACTION W/PHACO Right 01/25/2017   Procedure: CATARACT EXTRACTION PHACO AND INTRAOCULAR LENS PLACEMENT ([place]) CDE:16.07;  Surgeon: Tonny Branch, MD;  Location: [facility];  Service: Ophthalmology;  Laterality: Right;  right  . FRACTURE SURGERY Right    arm  1974  . LUMBAR WOUND DEBRIDEMENT N/A 09/23/2017   Procedure: Exploration of Lumbar Wound; Repair of Pseudomeningocele;  Surgeon: Newman Pies, MD;  Location: [facility];  Service: Neurosurgery;  Laterality: N/A;  . TONSILLECTOMY     . TOTAL KNEE ARTHROPLASTY Right 05/30/2019   Procedure: RIGHT TOTAL KNEE ARTHROPLASTY;  Surgeon: Mcarthur Rossetti, MD;  Location: [facility];  Service: Orthopedics;  Laterality: Right;    There were no vitals filed for this visit.  Subjective Assessment - 07/03/19 0848    Subjective  pt states her pain remains at 2/10.   States she has been walking to her mailbox 2X day (approx 200 feet) using her walking stick.    Currently in Pain?  Yes    Pain Score  2     Pain Location  Knee    Pain Orientation  Right    Pain Descriptors / Indicators  Tightness;Sore                       [facility] Adult PT Treatment/Exercise - 07/03/19 0001      Knee/Hip Exercises: Stretches   Active Hamstring Stretch  Right;3 reps;30 seconds    Active Hamstring Stretch Limitations  standing 12" step    Knee: Self-Stretch to increase Flexion  Right;10 seconds;Limitations    Knee: Self-Stretch Limitations  10 reps on 12" step    Gastroc Stretch  Both;3 reps;30 seconds    Gastroc Stretch Limitations  slant board      Knee/Hip Exercises: Standing  Heel Raises  Both;1 set;15 reps    Heel Raises Limitations  heel and toe     Knee Flexion  Right;1 set;10 reps    Lateral Step Up  Right;10 reps;Hand Hold: 2;Step Height: 4"    Lateral Step Up Limitations  4" step    Forward Step Up  Right;10 reps;Hand Hold: 1;Step Height: 4"    Forward Step Up Limitations  4" step    Step Down  Both;1 set;10 reps;Hand Hold: 1;Step Height: 4"    Step Down Limitations  4" step    Gait Training  gait training 265ft, cues for heel-toe and cane use/sequencing;       Knee/Hip Exercises: Seated   Sit to Sand  10 reps;without UE support      Knee/Hip Exercises: Supine   Quad Sets  AROM;Right;10 reps    Short Arc [employer]  Right;1 set;15 reps    Short Arc [employer] Limitations  5" holds    Straight Leg Raises  Right;1 set;10 reps    Straight Leg Raises Limitations  quad set     Knee Extension  AROM    Knee Extension  Limitations  4    Knee Flexion  AROM    Knee Flexion Limitations  118      Manual Therapy   Manual Therapy  Soft tissue mobilization;Myofascial release    Manual therapy comments  completed separate of all other skilled services     Edema Management  retrograde massage with LE elevated     Joint Mobilization  patella mobility all directions     Myofascial Release  to decrease adhesions    Other Manual Therapy  knee in flexion with MFR               PT Short Term Goals - 06/19/19 1243      PT SHORT TERM GOAL #1   Title  Patient to be compliant with HEP, to be updated PRN    Time  2    Period  Weeks    Status  New    Target Date  07/03/19      PT SHORT TERM GOAL #2   Title  Patient to be independent in edema management strategies to reduce pain and discomfort related to swelling    Time  2    Period  Weeks    Status  New      PT SHORT TERM GOAL #3   Title  Patient to have gained 10 degrees of surgical knee flexion and extension in order to improve gait mechanics and reduce pain    Time  2    Period  Weeks    Status  New      PT SHORT TERM GOAL #4   Title  Patient to be able to ambulate with equal stance times/step lengths, elimination of antalgic pattern, and pain no more than 2/10 in order improve community access and facilitate return to work    Time  2    Period  Weeks    Status  New        PT Long Term Goals - 06/19/19 1251      PT LONG TERM GOAL #1   Title  Patient to show MMT in all tested muscle groups as being 5/5 in order to improve functional task tolerance and reduce pain    Time  4    Period  Weeks    Status  New    Target Date  07/17/19      PT LONG TERM GOAL #2   Title  Patient to be able to gait train 221ft during 2MWT in order to show improved mobility and ease of community access    Time  4    Period  Weeks    Status  New      PT LONG TERM GOAL #3   Title  Patient to be able to maintain SLS and tandem stance for equal amounts of time  in order to show improved functional balance and strength    Time  4    Period  Weeks    Status  New      PT LONG TERM GOAL #4   Title  Patient to be able to tolerate 3 hours of work without increased pain in order to improve functional task tolerance    Time  4    Period  Weeks    Status  New            Plan - 07/03/19 1149    Clinical Impression Statement  continued to improve upon gait, ROM and functional strenghtening.  Pt relies heavily on UE's to complete step ups but able to reduce with encouragement.  Continued with manual working to reduce scar tissue mosty proximally.  improved flexion to 118 degrees following manual but without change in extension.    Personal Factors and Comorbidities  Age;Fitness;Past/Current Experience;Comorbidity 1;Sex;Education;Social Background;Time since onset of injury/illness/exacerbation    Examination-Activity Limitations  Bathing;Locomotion Level;Transfers;Bed Mobility;Sit;Sleep;Squat;Stairs;Stand;Lift    Examination-Participation Restrictions  Church;Yard Work;Cleaning;Driving;Interpersonal Relationship;Laundry;Shop    Stability/Clinical Decision Making  Stable/Uncomplicated    Rehab Potential  Excellent    PT Frequency  2x / week    PT Duration  4 weeks    PT Treatment/Interventions  ADLs/Self Care Home Management;Cryotherapy;Electrical Stimulation;Iontophoresis 4mg /ml Dexamethasone;Moist Heat;Ultrasound;DME Instruction;Neuromuscular re-education;Patient/family education;Manual techniques;Scar mobilization;Passive range of motion;Therapeutic exercise;Therapeutic activities;Functional mobility training;Stair training;Gait training;Balance training;Taping    PT Next Visit Plan  continue low grade strengthening, keep working on eccentric strength. ROM, balance, gait/stair training.    PT Home Exercise Plan  Eval: bridges, sit to stand, knee flexion stretch, hospital HEP; 06/30/19: SAQ, STS and walking program.       Patient will benefit from  skilled therapeutic intervention in order to improve the following deficits and impairments:  Abnormal gait, Decreased coordination, Decreased range of motion, Difficulty walking, Increased fascial restricitons, Decreased safety awareness, Decreased activity tolerance, Decreased skin integrity, Pain, Decreased balance, Decreased knowledge of use of DME, Improper body mechanics, Decreased mobility, Decreased strength, Increased edema  Visit Diagnosis: 1. Difficulty in walking, not elsewhere classified   2. Localized edema   3. Muscle weakness (generalized)   4. Acute pain of right knee        Problem List Patient Active Problem List   Diagnosis Date Noted  . Status post total right knee replacement 05/30/2019  . Unilateral primary osteoarthritis, left knee 04/18/2019  . Unilateral primary osteoarthritis, right knee 04/18/2019  . CVA (cerebral vascular accident) ([place]) 08/04/2018  . Stroke ([place]) 08/04/2018  . Chronic back pain 08/04/2018  . Post-operative pain 09/20/2017  . 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 Lumbar stenosis with neurogenic claudication 02/04/2015  . Stiffness of joints, not elsewhere classified, multiple sites 10/27/2012  . Weakness of both legs 10/27/2012  . Difficulty in walking(719.7) 10/27/2012   Teena Irani, PTA/CLT [phone]  Teena Irani 07/03/2019, 12:09 PM  [facility] [place], [place], [address]  Phone: [phone]   Fax:  [phone]  Name: [name] MRN: [id number] Date of Birth: 02-Sep-1946

## 2019-07-05 ENCOUNTER — Encounter (HOSPITAL_COMMUNITY): Payer: Self-pay | Admitting: Physical Therapy

## 2019-07-05 ENCOUNTER — Other Ambulatory Visit: Payer: Self-pay

## 2019-07-05 ENCOUNTER — Ambulatory Visit (HOSPITAL_COMMUNITY): Payer: Medicare Other | Admitting: Physical Therapy

## 2019-07-05 DIAGNOSIS — R600 Localized edema: Secondary | ICD-10-CM | POA: Diagnosis not present

## 2019-07-05 DIAGNOSIS — M6281 Muscle weakness (generalized): Secondary | ICD-10-CM | POA: Diagnosis not present

## 2019-07-05 DIAGNOSIS — R262 Difficulty in walking, not elsewhere classified: Secondary | ICD-10-CM

## 2019-07-05 DIAGNOSIS — E782 Mixed hyperlipidemia: Secondary | ICD-10-CM | POA: Diagnosis not present

## 2019-07-05 DIAGNOSIS — D509 Iron deficiency anemia, unspecified: Secondary | ICD-10-CM | POA: Diagnosis not present

## 2019-07-05 DIAGNOSIS — M25561 Pain in right knee: Secondary | ICD-10-CM

## 2019-07-05 DIAGNOSIS — I10 Essential (primary) hypertension: Secondary | ICD-10-CM | POA: Diagnosis not present

## 2019-07-05 DIAGNOSIS — R7301 Impaired fasting glucose: Secondary | ICD-10-CM | POA: Diagnosis not present

## 2019-07-05 NOTE — Therapy (Signed)
[place], [place], [address] Phone: [phone]   Fax:  [phone]  Physical Therapy Treatment  Patient Details  Name: [name] MRN: [id number] Date of Birth: January 13, 1946 Referring Provider (PT): Jean Rosenthal    Encounter Date: 07/05/2019  PT End of Session - 07/05/19 1205    Visit Number  8    Number of Visits  10    Date for PT Re-Evaluation  07/17/19    Authorization Type  [employer] Medicare    Authorization Time Period  06/19/2019 to 07/17/2019    Authorization - Visit Number  8    Authorization - Number of Visits  10    PT Start Time  1117    PT Stop Time  1156    PT Time Calculation (min)  39 min    Activity Tolerance  Patient tolerated treatment well    Behavior During Therapy  [facility] for tasks assessed/performed       Past Medical History:  Diagnosis Date  . Arthritis   . Cataracts, bilateral    surgery to remove cataracts - bilateral  . Chronic back pain   . Hypertension   . Stroke ([place])   . Wears dentures     Past Surgical History:  Procedure Laterality Date  . ABDOMINAL HYSTERECTOMY  82  . APPENDECTOMY    . BACK SURGERY  2010  . BACK SURGERY  2015  . BACK SURGERY  06/2016  . CATARACT EXTRACTION W/PHACO Left 01/07/2017   Procedure: CATARACT EXTRACTION PHACO AND INTRAOCULAR LENS PLACEMENT (IOC);  Surgeon: Tonny Branch, MD;  Location: [facility];  Service: Ophthalmology;  Laterality: Left;  left CDE 9.31   . CATARACT EXTRACTION W/PHACO Right 01/25/2017   Procedure: CATARACT EXTRACTION PHACO AND INTRAOCULAR LENS PLACEMENT ([place]) CDE:16.07;  Surgeon: Tonny Branch, MD;  Location: [facility];  Service: Ophthalmology;  Laterality: Right;  right  . FRACTURE SURGERY Right    arm  1974  . LUMBAR WOUND DEBRIDEMENT N/A 09/23/2017   Procedure: Exploration of Lumbar Wound; Repair of Pseudomeningocele;  Surgeon: Newman Pies, MD;  Location: [place];  Service: Neurosurgery;  Laterality: N/A;  . TONSILLECTOMY     . TOTAL KNEE ARTHROPLASTY Right 05/30/2019   Procedure: RIGHT TOTAL KNEE ARTHROPLASTY;  Surgeon: Mcarthur Rossetti, MD;  Location: [facility];  Service: Orthopedics;  Laterality: Right;    There were no vitals filed for this visit.  Subjective Assessment - 07/05/19 1117    Subjective  Doing well, no major changes since last time. Mild pain but doing well. I need to be able to walk better, its pain and weakness that are making it hard for me.    Patient Stated Goals  I need t oget well so I can get back to work    Currently in Pain?  Yes    Pain Score  1     Pain Location  Knee    Pain Orientation  Right    Pain Descriptors / Indicators  Tightness;Sore    Pain Type  Surgical pain                       [facility] Adult PT Treatment/Exercise - 07/05/19 0001      Knee/Hip Exercises: Stretches   Active Hamstring Stretch  Right;3 reps;30 seconds    Active Hamstring Stretch Limitations  standing 12" step    Knee: Self-Stretch to increase Flexion  Right;10 seconds;Limitations    Knee: Self-Stretch  Limitations  10 reps on 12" step    Gastroc Stretch  Both;3 reps;30 seconds    Gastroc Stretch Limitations  slant board       Knee/Hip Exercises: Standing   Heel Raises  Both;1 set;20 reps    Heel Raises Limitations  heel and toe     Knee Flexion  Right;1 set;15 reps    Knee Flexion Limitations  2 second holds     Lateral Step Up  Right;1 set;15 reps;Hand Hold: 1;Step Height: 4"    Forward Step Up  Right;1 set;15 reps;Hand Hold: 1;Step Height: 4"    Step Down  Right;1 set;10 reps;Hand Hold: 1;Step Height: 4"    Gait Training  gait training 246ftx2, cues for heel-toe and cane use/sequencing;     Other Standing Knee Exercises  6" hurdle navigation forward and lateral  B UE support     Other Standing Knee Exercises  hip hikes with and without swing 1x10 B with B UE support           Balance Exercises - 07/05/19 1153      Balance Exercises: Standing   Tandem Stance  Eyes  open;Foam/compliant surface;Intermittent upper extremity support;3 reps;15 secs        PT Education - 07/05/19 1200    Education Details  POC moving forward, re-assess on Monday prior to MD visit, exercise and gait form    Person(s) Educated  Patient    Methods  Explanation    Comprehension  Verbalized understanding       PT Short Term Goals - 06/19/19 1243      PT SHORT TERM GOAL #1   Title  Patient to be compliant with HEP, to be updated PRN    Time  2    Period  Weeks    Status  New    Target Date  07/03/19      PT SHORT TERM GOAL #2   Title  Patient to be independent in edema management strategies to reduce pain and discomfort related to swelling    Time  2    Period  Weeks    Status  New      PT SHORT TERM GOAL #3   Title  Patient to have gained 10 degrees of surgical knee flexion and extension in order to improve gait mechanics and reduce pain    Time  2    Period  Weeks    Status  New      PT SHORT TERM GOAL #4   Title  Patient to be able to ambulate with equal stance times/step lengths, elimination of antalgic pattern, and pain no more than 2/10 in order improve community access and facilitate return to work    Time  2    Period  Weeks    Status  New        PT Long Term Goals - 06/19/19 1251      PT LONG TERM GOAL #1   Title  Patient to show MMT in all tested muscle groups as being 5/5 in order to improve functional task tolerance and reduce pain    Time  4    Period  Weeks    Status  New    Target Date  07/17/19      PT LONG TERM GOAL #2   Title  Patient to be able to gait train 264ft during 2MWT in order to show improved mobility and ease of community access    Time  4  Period  Weeks    Status  New      PT LONG TERM GOAL #3   Title  Patient to be able to maintain SLS and tandem stance for equal amounts of time in order to show improved functional balance and strength    Time  4    Period  Weeks    Status  New      PT LONG TERM GOAL #4    Title  Patient to be able to tolerate 3 hours of work without increased pain in order to improve functional task tolerance    Time  4    Period  Weeks    Status  New            Plan - 07/05/19 1206    Clinical Impression Statement  [name] arrives doing well today, reports she is almost where she would like to be but that she continues to feel limited by pain and weakness. Continued with strengthening program and gait training this session, progressing as tolerated, and ended with edema massage to assist in ongoing edema control process moving forward. Plan to re-assess in next 1-2 sessions for purposes of progress note and DC planning.    Personal Factors and Comorbidities  Age;Fitness;Past/Current Experience;Comorbidity 1;Sex;Education;Social Background;Time since onset of injury/illness/exacerbation    Examination-Activity Limitations  Bathing;Locomotion Level;Transfers;Bed Mobility;Sit;Sleep;Squat;Stairs;Stand;Lift    Examination-Participation Restrictions  Church;Yard Work;Cleaning;Driving;Interpersonal Relationship;Laundry;Shop    Stability/Clinical Decision Making  Stable/Uncomplicated    Clinical Decision Making  Low    Rehab Potential  Excellent    PT Frequency  2x / week    PT Duration  4 weeks    PT Treatment/Interventions  ADLs/Self Care Home Management;Cryotherapy;Electrical Stimulation;Iontophoresis 4mg /ml Dexamethasone;Moist Heat;Ultrasound;DME Instruction;Neuromuscular re-education;Patient/family education;Manual techniques;Scar mobilization;Passive range of motion;Therapeutic exercise;Therapeutic activities;Functional mobility training;Stair training;Gait training;Balance training;Taping    PT Next Visit Plan  Need to work on gait no device, plan for re-assess Monday    PT Home Exercise Plan  Eval: bridges, sit to stand, knee flexion stretch, hospital HEP; 06/30/19: SAQ, STS and walking program.    Consulted and Agree with Plan of Care  Patient       Patient will  benefit from skilled therapeutic intervention in order to improve the following deficits and impairments:  Abnormal gait, Decreased coordination, Decreased range of motion, Difficulty walking, Increased fascial restricitons, Decreased safety awareness, Decreased activity tolerance, Decreased skin integrity, Pain, Decreased balance, Decreased knowledge of use of DME, Improper body mechanics, Decreased mobility, Decreased strength, Increased edema  Visit Diagnosis: 1. Difficulty in walking, not elsewhere classified   2. Localized edema   3. Muscle weakness (generalized)   4. Acute pain of right knee        Problem List Patient Active Problem List   Diagnosis Date Noted  . Status post total right knee replacement 05/30/2019  . Unilateral primary osteoarthritis, left knee 04/18/2019  . Unilateral primary osteoarthritis, right knee 04/18/2019  . CVA (cerebral vascular accident) ([place]) 08/04/2018  . Stroke ([place]) 08/04/2018  . Chronic back pain 08/04/2018  . Post-operative pain 09/20/2017  . 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 Lumbar stenosis with neurogenic claudication 02/04/2015  . Stiffness of joints, not elsewhere classified, multiple sites 10/27/2012  . Weakness of both legs 10/27/2012  . Difficulty in walking(719.7) 10/27/2012    Deniece Ree PT, DPT, CBIS  Supplemental Physical Therapist [place]    Pager [phone] Acute Rehab Office [place],  [place], [address] Phone: [phone]   Fax:  [phone]  Name: [name] MRN: [id number] Date of Birth: 1946/05/26

## 2019-07-07 ENCOUNTER — Ambulatory Visit (HOSPITAL_COMMUNITY): Payer: Medicare Other | Admitting: Physical Therapy

## 2019-07-07 ENCOUNTER — Other Ambulatory Visit: Payer: Self-pay

## 2019-07-07 ENCOUNTER — Encounter (HOSPITAL_COMMUNITY): Payer: Self-pay | Admitting: Physical Therapy

## 2019-07-07 DIAGNOSIS — M25561 Pain in right knee: Secondary | ICD-10-CM

## 2019-07-07 DIAGNOSIS — M6281 Muscle weakness (generalized): Secondary | ICD-10-CM

## 2019-07-07 DIAGNOSIS — R600 Localized edema: Secondary | ICD-10-CM | POA: Diagnosis not present

## 2019-07-07 DIAGNOSIS — R262 Difficulty in walking, not elsewhere classified: Secondary | ICD-10-CM

## 2019-07-07 NOTE — Therapy (Signed)
[place] [address] [place], [place], [address] Phone: [phone]   Fax:  [phone]  Physical Therapy Treatment  Patient Details  Name: [name] MRN: [id number] Date of Birth: [birth year] Referring Provider (PT): Jean Rosenthal    Encounter Date: 07/07/2019  PT End of Session - 07/07/19 1001    Visit Number  9    Number of Visits  10    Date for PT Re-Evaluation  07/17/19    Authorization Type  [employer] Medicare    Authorization Time Period  06/19/2019 to 07/17/2019    Authorization - Visit Number  9    Authorization - Number of Visits  10    PT Start Time  0918    PT Stop Time  0958    PT Time Calculation (min)  40 min    Activity Tolerance  Patient tolerated treatment well    Behavior During Therapy  [facility] for tasks assessed/performed       Past Medical History:  Diagnosis Date  . Arthritis   . Cataracts, bilateral    surgery to remove cataracts - bilateral  . Chronic back pain   . Hypertension   . Stroke ([place])   . Wears dentures     Past Surgical History:  Procedure Laterality Date  . ABDOMINAL HYSTERECTOMY  82  . APPENDECTOMY    . BACK SURGERY  2010  . BACK SURGERY  2015  . BACK SURGERY  06/2016  . CATARACT EXTRACTION W/PHACO Left 01/07/2017   Procedure: CATARACT EXTRACTION PHACO AND INTRAOCULAR LENS PLACEMENT (IOC);  Surgeon: Tonny Branch, MD;  Location: [facility];  Service: Ophthalmology;  Laterality: Left;  left CDE 9.31   . CATARACT EXTRACTION W/PHACO Right 01/25/2017   Procedure: CATARACT EXTRACTION PHACO AND INTRAOCULAR LENS PLACEMENT ([place]) CDE:16.07;  Surgeon: Tonny Branch, MD;  Location: [facility];  Service: Ophthalmology;  Laterality: Right;  right  . FRACTURE SURGERY Right    arm  1974  . LUMBAR WOUND DEBRIDEMENT N/A 09/23/2017   Procedure: Exploration of Lumbar Wound; Repair of Pseudomeningocele;  Surgeon: Newman Pies, MD;  Location: [facility];  Service: Neurosurgery;  Laterality: N/A;  . TONSILLECTOMY     . TOTAL KNEE ARTHROPLASTY Right 05/30/2019   Procedure: RIGHT TOTAL KNEE ARTHROPLASTY;  Surgeon: Mcarthur Rossetti, MD;  Location: [facility];  Service: Orthopedics;  Laterality: Right;    There were no vitals filed for this visit.  Subjective Assessment - 07/07/19 0923    Subjective  I got rid of my walking stick yesterday; I'm doing better every time I come to PT and am doing better at night    Patient Stated Goals  I need t oget well so I can get back to work    Currently in Pain?  Yes    Pain Score  2     Pain Location  Knee    Pain Orientation  Right    Pain Descriptors / Indicators  Sore;Tightness                       [facility] Adult PT Treatment/Exercise - 07/07/19 0001      Knee/Hip Exercises: Stretches   Active Hamstring Stretch  Right;3 reps;30 seconds    Active Hamstring Stretch Limitations  standing 12" box     Knee: Self-Stretch to increase Flexion  Right;10 seconds;Limitations    Knee: Self-Stretch Limitations  10 reps on 12" step    Gastroc Stretch  Both;3 reps;30 seconds  Gastroc Stretch Limitations  slant board       Knee/Hip Exercises: Standing   Heel Raises  Both;1 set;20 reps    Heel Raises Limitations  heel and toe     Lateral Step Up  Right;1 set;10 reps;Hand Hold: 1;Step Height: 6"    Forward Step Up  Right;1 set;10 reps;Hand Hold: 1;Step Height: 6"    Step Down  Right;1 set;20 reps;Hand Hold: 1;Step Height: 2"    Rocker Board  2 minutes    Rocker Board Limitations  lateral and AP, B UE support     Gait Training  gait training 260ftx2, no device, VC for heel-toe sequencing and gait pattern       Knee/Hip Exercises: Seated   Sit to Sand  2 sets;10 reps;without UE support   eccentric stand to sit             PT Education - 07/07/19 1001    Education Details  re-assess next session, exercise form, gait mechanics    Person(s) Educated  Patient    Methods  Explanation    Comprehension  Verbalized understanding;Need further  instruction       PT Short Term Goals - 06/19/19 1243      PT SHORT TERM GOAL #1   Title  Patient to be compliant with HEP, to be updated PRN    Time  2    Period  Weeks    Status  New    Target Date  07/03/19      PT SHORT TERM GOAL #2   Title  Patient to be independent in edema management strategies to reduce pain and discomfort related to swelling    Time  2    Period  Weeks    Status  New      PT SHORT TERM GOAL #3   Title  Patient to have gained 10 degrees of surgical knee flexion and extension in order to improve gait mechanics and reduce pain    Time  2    Period  Weeks    Status  New      PT SHORT TERM GOAL #4   Title  Patient to be able to ambulate with equal stance times/step lengths, elimination of antalgic pattern, and pain no more than 2/10 in order improve community access and facilitate return to work    Time  2    Period  Weeks    Status  New        PT Long Term Goals - 06/19/19 1251      PT LONG TERM GOAL #1   Title  Patient to show MMT in all tested muscle groups as being 5/5 in order to improve functional task tolerance and reduce pain    Time  4    Period  Weeks    Status  New    Target Date  07/17/19      PT LONG TERM GOAL #2   Title  Patient to be able to gait train 270ft during 2MWT in order to show improved mobility and ease of community access    Time  4    Period  Weeks    Status  New      PT LONG TERM GOAL #3   Title  Patient to be able to maintain SLS and tandem stance for equal amounts of time in order to show improved functional balance and strength    Time  4    Period  Weeks  Status  New      PT LONG TERM GOAL #4   Title  Patient to be able to tolerate 3 hours of work without increased pain in order to improve functional task tolerance    Time  4    Period  Weeks    Status  New            Plan - 07/07/19 1002    Clinical Impression Statement  [name] arrives stating that she is doing well today; she has been able  to get rid of her walking stick and reports that she has been at work for a few hours at a time, where her knee has felt very good. Continued to progress standing activities and stair/gait training as tolerated today. She continues to make excellent progress    Personal Factors and Comorbidities  Age;Fitness;Past/Current Experience;Comorbidity 1;Sex;Education;Social Background;Time since onset of injury/illness/exacerbation    Examination-Activity Limitations  Bathing;Locomotion Level;Transfers;Bed Mobility;Sit;Sleep;Squat;Stairs;Stand;Lift    Examination-Participation Restrictions  Church;Yard Work;Cleaning;Driving;Interpersonal Relationship;Laundry;Shop    Stability/Clinical Decision Making  Stable/Uncomplicated    Clinical Decision Making  Low    Rehab Potential  Excellent    PT Frequency  2x / week    PT Duration  4 weeks    PT Treatment/Interventions  ADLs/Self Care Home Management;Cryotherapy;Electrical Stimulation;Iontophoresis 4mg /ml Dexamethasone;Moist Heat;Ultrasound;DME Instruction;Neuromuscular re-education;Patient/family education;Manual techniques;Scar mobilization;Passive range of motion;Therapeutic exercise;Therapeutic activities;Functional mobility training;Stair training;Gait training;Balance training;Taping    PT Next Visit Plan  Need to work on gait no device, plan for re-assess Monday    PT Home Exercise Plan  Eval: bridges, sit to stand, knee flexion stretch, hospital HEP; 06/30/19: SAQ, STS and walking program.    Consulted and Agree with Plan of Care  Patient       Patient will benefit from skilled therapeutic intervention in order to improve the following deficits and impairments:  Abnormal gait, Decreased coordination, Decreased range of motion, Difficulty walking, Increased fascial restricitons, Decreased safety awareness, Decreased activity tolerance, Decreased skin integrity, Pain, Decreased balance, Decreased knowledge of use of DME, Improper body mechanics, Decreased  mobility, Decreased strength, Increased edema  Visit Diagnosis: Difficulty in walking, not elsewhere classified  Localized edema  Muscle weakness (generalized)  Acute pain of right knee     Problem List Patient Active Problem List   Diagnosis Date Noted  . Status post total right knee replacement 05/30/2019  . Unilateral primary osteoarthritis, left knee 04/18/2019  . Unilateral primary osteoarthritis, right knee 04/18/2019  . CVA (cerebral vascular accident) ([place]) 08/04/2018  . Stroke ([place]) 08/04/2018  . Chronic back pain 08/04/2018  . Post-operative pain 09/20/2017  . 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 Lumbar stenosis with neurogenic claudication 02/04/2015  . Stiffness of joints, not elsewhere classified, multiple sites 10/27/2012  . Weakness of both legs 10/27/2012  . Difficulty in walking(719.7) 10/27/2012    Deniece Ree PT, DPT, CBIS  Supplemental Physical Therapist [facility]    Pager [phone] Acute Rehab Office [place] [address] [place], [place], [address] Phone: [phone]   Fax:  [phone]  Name: [name] MRN: [id number] Date of Birth: 14-Nov-1946

## 2019-07-10 ENCOUNTER — Other Ambulatory Visit: Payer: Self-pay

## 2019-07-10 ENCOUNTER — Encounter (HOSPITAL_COMMUNITY): Payer: Self-pay | Admitting: Physical Therapy

## 2019-07-10 ENCOUNTER — Ambulatory Visit (HOSPITAL_COMMUNITY): Payer: Medicare Other | Admitting: Physical Therapy

## 2019-07-10 DIAGNOSIS — M6281 Muscle weakness (generalized): Secondary | ICD-10-CM | POA: Diagnosis not present

## 2019-07-10 DIAGNOSIS — R262 Difficulty in walking, not elsewhere classified: Secondary | ICD-10-CM

## 2019-07-10 DIAGNOSIS — R600 Localized edema: Secondary | ICD-10-CM

## 2019-07-10 DIAGNOSIS — M25561 Pain in right knee: Secondary | ICD-10-CM

## 2019-07-10 NOTE — Therapy (Signed)
[facility] [address] [place], [place], [address] Phone: [phone]   Fax:  [phone]  Physical Therapy Treatment  Patient Details  Name: [name] MRN: [id number] Date of Birth: 04-19-1946 Referring Provider (PT): Jean Rosenthal    Progress Note Reporting Period 06/19/19 to 07/10/19  See note below for Objective Data and Assessment of Progress/Goals.      Encounter Date: 07/10/2019  PT End of Session - 07/10/19 1102    Visit Number  10    Number of Visits  16    Date for PT Re-Evaluation  08/04/19    Authorization Type  [employer] Medicare    Authorization Time Period  06/19/2019 to 07/17/2019; NEW 8/24- 08/04/19    Authorization - Visit Number  10    Authorization - Number of Visits  10    PT Start Time  1020    PT Stop Time  1058    PT Time Calculation (min)  38 min    Activity Tolerance  Patient tolerated treatment well    Behavior During Therapy  WFL for tasks assessed/performed       Past Medical History:  Diagnosis Date  . Arthritis   . Cataracts, bilateral    surgery to remove cataracts - bilateral  . Chronic back pain   . Hypertension   . Stroke ([place])   . Wears dentures     Past Surgical History:  Procedure Laterality Date  . ABDOMINAL HYSTERECTOMY  82  . APPENDECTOMY    . BACK SURGERY  2010  . BACK SURGERY  2015  . BACK SURGERY  06/2016  . CATARACT EXTRACTION W/PHACO Left 01/07/2017   Procedure: CATARACT EXTRACTION PHACO AND INTRAOCULAR LENS PLACEMENT (IOC);  Surgeon: Tonny Branch, MD;  Location: [facility];  Service: Ophthalmology;  Laterality: Left;  left CDE 9.31   . CATARACT EXTRACTION W/PHACO Right 01/25/2017   Procedure: CATARACT EXTRACTION PHACO AND INTRAOCULAR LENS PLACEMENT ([place]) CDE:16.07;  Surgeon: Tonny Branch, MD;  Location: [facility];  Service: Ophthalmology;  Laterality: Right;  right  . FRACTURE SURGERY Right    arm  1974  . LUMBAR WOUND DEBRIDEMENT N/A 09/23/2017   Procedure: Exploration of  Lumbar Wound; Repair of Pseudomeningocele;  Surgeon: Newman Pies, MD;  Location: [facility];  Service: Neurosurgery;  Laterality: N/A;  . TONSILLECTOMY    . TOTAL KNEE ARTHROPLASTY Right 05/30/2019   Procedure: RIGHT TOTAL KNEE ARTHROPLASTY;  Surgeon: Mcarthur Rossetti, MD;  Location: [facility];  Service: Orthopedics;  Laterality: Right;    There were no vitals filed for this visit.  Subjective Assessment - 07/10/19 1022    Subjective  I'm still feeling a little nervous walking without my stick but that's only because I used it so long. I see the doctor tomorrow. I still feel like strength and balance are my big issue right now. I feel like I'm at 80% compared to my baseline. I walked in the yard yesterday with my walking stick.    How long can you sit comfortably?  8/24- 60 minutes    How long can you stand comfortably?  8/24- 30 minutes    How long can you walk comfortably?  8/24- 15-20 minutes    Patient Stated Goals  I need t oget well so I can get back to work    Currently in Pain?  Yes    Pain Score  2     Pain Location  Knee    Pain Orientation  Right  Pain Descriptors / Indicators  Tightness;Sore    Pain Type  Surgical pain    Pain Radiating Towards  none    Pain Onset  More than a month ago    Pain Frequency  Constant    Aggravating Factors   standing too long    Pain Relieving Factors  ice    Effect of Pain on Daily Activities  moderate         [facility] PT Assessment - 07/10/19 0001      Assessment   Medical Diagnosis  s/p R TKR     Referring Provider (PT)  Jean Rosenthal     Onset Date/Surgical Date  06/02/19    Next MD Visit  Dr. Ninfa Linden 07/11/19    Prior Therapy  yes       Precautions   Precautions  None      Balance Screen   Has the patient fallen in the past 6 months  No    Has the patient had a decrease in activity level because of a fear of falling?   Yes    Is the patient reluctant to leave their home because of a fear of falling?   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     Leisure  outdoor activities, sewing, going to car shows       AROM   Right Knee Extension  3    Right Knee Flexion  118      Strength   Right Hip Flexion  4/5    Right Hip Extension  3-/5    Right Hip ABduction  4+/5    Left Hip Flexion  4/5    Left Hip Extension  3-/5    Left Hip ABduction  4/5    Right Knee Flexion  4+/5    Right Knee Extension  4+/5    Left Knee Flexion  4+/5    Left Knee Extension  5/5    Right Ankle Dorsiflexion  5/5    Left Ankle Dorsiflexion  5/5      Transfers   Five time sit to stand comments   14.8 B UEs on thighs       Ambulation/Gait   Gait Comments  2MWT 366f no device       High Level Balance   High Level Balance Comments  TUG 11.4 seconds no device; tandem stance R foot back 30 seconds                     [facility] Adult PT Treatment/Exercise - 07/10/19 0001      Knee/Hip Exercises: Standing   Lateral Step Up  Right;1 set;15 reps;Hand Hold: 2;Step Height: 6"    Forward Step Up  Right;1 set;15 reps;Hand Hold: 2;Step Height: 6"      Knee/Hip Exercises: Seated   Sit to Sand  2 sets;10 reps;without UE support   eccentric stand to sit             PT Education - 07/10/19 1102    Education Details  re-assess findings/goal review, progress with PT, POC moving forward.    Person(s) Educated  Patient    Methods  Explanation    Comprehension  Verbalized understanding  PT Short Term Goals - 07/10/19 1045      PT SHORT TERM GOAL #1   Title  Patient to be compliant with HEP, to be updated PRN    Time  2    Period  Weeks    Status  Achieved      PT SHORT TERM GOAL #2   Title  Patient to be independent in edema management strategies to reduce pain and discomfort related to swelling    Time  2     Period  Weeks    Status  Achieved      PT SHORT TERM GOAL #3   Title  Patient to have gained 10 degrees of surgical knee flexion and extension in order to improve gait mechanics and reduce pain    Time  2    Period  Weeks    Status  Achieved      PT SHORT TERM GOAL #4   Title  Patient to be able to ambulate with equal stance times/step lengths, elimination of antalgic pattern, and pain no more than 2/10 in order improve community access and facilitate return to work    Time  2    Period  Weeks    Status  Partially Met        PT Long Term Goals - 07/10/19 1046      PT LONG TERM GOAL #1   Title  Patient to show MMT in all tested muscle groups as being 5/5 in order to improve functional task tolerance and reduce pain    Time  4    Period  Weeks    Status  Partially Met      PT LONG TERM GOAL #2   Title  Patient to be able to gait train 237f during 2MWT in order to show improved mobility and ease of community access    Time  4    Period  Weeks    Status  Achieved      PT LONG TERM GOAL #3   Title  Patient to be able to maintain SLS and tandem stance for equal amounts of time in order to show improved functional balance and strength    Time  4    Period  Weeks    Status  Achieved      PT LONG TERM GOAL #4   Title  Patient to be able to tolerate 3 hours of work without increased pain in order to improve functional task tolerance    Time  4    Period  Weeks    Status  Achieved            Plan - 07/10/19 1103    Clinical Impression Statement  Ms. BSteinhartis making excellent progress with skilled PT services at this time. She demonstrates significant objective improvements in surgical knee ROM, overall strength, gait quality and speed, general mobility, and balance. Measures do show ongoing impairments in functional strength and Ms. Zheng's reports of perceived limitations agree with ongoing difficulties with functional weakness. Recommend that she continue with 6  additional session of skilled PT services in order to continue to address functional limitations and assist with safe return to functional activities.    Personal Factors and Comorbidities  Age;Fitness;Past/Current Experience;Comorbidity 1;Sex;Education;Social Background;Time since onset of injury/illness/exacerbation    Examination-Activity Limitations  Bathing;Locomotion Level;Transfers;Bed Mobility;Sit;Sleep;Squat;Stairs;Stand;Lift    Examination-Participation Restrictions  Church;Yard Work;Cleaning;Driving;Interpersonal Relationship;Laundry;Shop    Stability/Clinical Decision Making  Stable/Uncomplicated    Clinical Decision Making  Low  Rehab Potential  Excellent    PT Frequency  2x / week   6 more visits   PT Duration  3 weeks   starting from date of this cert/note   PT Treatment/Interventions  ADLs/Self Care Home Management;Cryotherapy;Electrical Stimulation;Iontophoresis 27m/ml Dexamethasone;Moist Heat;Ultrasound;DME Instruction;Neuromuscular re-education;Patient/family education;Manual techniques;Scar mobilization;Passive range of motion;Therapeutic exercise;Therapeutic activities;Functional mobility training;Stair training;Gait training;Balance training;Taping    PT Next Visit Plan  6 more visits; focus on advancing strength and endurance, balance. Needs HEP update    PT Home Exercise Plan  Eval: bridges, sit to stand, knee flexion stretch, hospital HEP; 06/30/19: SAQ, STS and walking program.    Consulted and Agree with Plan of Care  Patient       Patient will benefit from skilled therapeutic intervention in order to improve the following deficits and impairments:  Abnormal gait, Decreased coordination, Decreased range of motion, Difficulty walking, Increased fascial restricitons, Decreased safety awareness, Decreased activity tolerance, Decreased skin integrity, Pain, Decreased balance, Decreased knowledge of use of DME, Improper body mechanics, Decreased mobility, Decreased strength,  Increased edema  Visit Diagnosis: Difficulty in walking, not elsewhere classified - Plan: PT plan of care cert/re-cert  Localized edema - Plan: PT plan of care cert/re-cert  Muscle weakness (generalized) - Plan: PT plan of care cert/re-cert  Acute pain of right knee - Plan: PT plan of care cert/re-cert     Problem List Patient Active Problem List   Diagnosis Date Noted  . Status post total right knee replacement 05/30/2019  . Unilateral primary osteoarthritis, left knee 04/18/2019  . Unilateral primary osteoarthritis, right knee 04/18/2019  . [place]  . [place]  . Chronic back pain 08/04/2018  . Post-operative pain 09/20/2017  . 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 Lumbar stenosis with neurogenic claudication 02/04/2015  . Stiffness of joints, not elsewhere classified, multiple sites 10/27/2012  . Weakness of both legs 10/27/2012  . Difficulty in walking(719.7) 10/27/2012    KDeniece ReePT, DPT, CBIS  Supplemental Physical Therapist [facility]   Pager [phone] Rehab Office [address] [address]Phone: [phone]  Fax:  [phone] Name: Dawn LANGLAISMRN: [id number] of Birth: 3October 21, 1947

## 2019-07-11 ENCOUNTER — Encounter: Payer: Self-pay | Admitting: Orthopaedic Surgery

## 2019-07-11 ENCOUNTER — Ambulatory Visit (INDEPENDENT_AMBULATORY_CARE_PROVIDER_SITE_OTHER): Payer: Medicare Other | Admitting: Orthopaedic Surgery

## 2019-07-11 DIAGNOSIS — D509 Iron deficiency anemia, unspecified: Secondary | ICD-10-CM | POA: Diagnosis not present

## 2019-07-11 DIAGNOSIS — E782 Mixed hyperlipidemia: Secondary | ICD-10-CM | POA: Diagnosis not present

## 2019-07-11 DIAGNOSIS — I10 Essential (primary) hypertension: Secondary | ICD-10-CM | POA: Diagnosis not present

## 2019-07-11 DIAGNOSIS — Z96651 Presence of right artificial knee joint: Secondary | ICD-10-CM

## 2019-07-11 DIAGNOSIS — R7301 Impaired fasting glucose: Secondary | ICD-10-CM | POA: Diagnosis not present

## 2019-07-11 DIAGNOSIS — M48062 Spinal stenosis, lumbar region with neurogenic claudication: Secondary | ICD-10-CM | POA: Diagnosis not present

## 2019-07-11 NOTE — Progress Notes (Signed)
The patient is now into her 6-week status post a right total knee arthroplasty.  She is an active 73 year old female.  She is on chronic pain medication.  She says is mainly just painful at night and she is doing well.  She is already working 3 half days a week.  She would like to be cleared to drive as well.  On examination of her right knee her extension is full.  Her flexion is to at least 105 degrees.  The knee feels ligamentously stable.  Her incisions well-healed.  Her calf is soft.  There is only mild to moderate swelling.  At this point she is cleared to drive.  She knows not to take narcotics while she drives.  She will continue therapy for at least 2 more weeks and then can stop her therapy.  We will see her back in 3 months.  At that visit I would like an AP and lateral of her right operative knee.  All question concerns were answered and addressed.

## 2019-07-12 ENCOUNTER — Other Ambulatory Visit: Payer: Self-pay

## 2019-07-12 ENCOUNTER — Encounter (HOSPITAL_COMMUNITY): Payer: Self-pay | Admitting: Physical Therapy

## 2019-07-12 ENCOUNTER — Ambulatory Visit (HOSPITAL_COMMUNITY): Payer: Medicare Other | Admitting: Physical Therapy

## 2019-07-12 DIAGNOSIS — R262 Difficulty in walking, not elsewhere classified: Secondary | ICD-10-CM

## 2019-07-12 DIAGNOSIS — M6281 Muscle weakness (generalized): Secondary | ICD-10-CM

## 2019-07-12 DIAGNOSIS — M25561 Pain in right knee: Secondary | ICD-10-CM

## 2019-07-12 DIAGNOSIS — R600 Localized edema: Secondary | ICD-10-CM | POA: Diagnosis not present

## 2019-07-12 NOTE — Patient Instructions (Signed)
   Stand to [employer] in standing near the front of the chair. Cross your arms over your chest or out of front of you (so they don't assist you). Very SLOWLY (should take you 4 seconds), start sitting. -Keep your bottom back -Don't let your knees travel past your toes -Keep knee's pointing forward (don't let touch) -Have your feet firmly planted in ground -I should be able to say, "Pause" at any point.  Repeat 15 times, twice a day.    STEP UP  Start by standing in front of a step/step stool with both feet on the floor. Step forward up the step with one leg and then the other leg. Return to starting position taking a step back towards the floor leading with the same leg.  Repeat 10-15 times with your surgical leg, 2 times a day.    Walking Program  Walking is a great way to improve cardiovascular endurance and improve leg strength.  Start by walking 5-10 minutes and progress yourself until you can walk continuously for 30-45 minutes straight.  It is a good habit to take a walk daily or at the very least 3-5 times per week.

## 2019-07-12 NOTE — Therapy (Signed)
[facility] [place], [place], [address] Phone: [phone]   Fax:  [phone]  Physical Therapy Treatment  Patient Details  Name: [name] MRN: [id number] Date of Birth: 02/09/1946 Referring Provider (PT): Jean Rosenthal    Encounter Date: 07/12/2019  PT End of Session - 07/12/19 1001    Visit Number  11    Number of Visits  16    Date for PT Re-Evaluation  08/04/19    Authorization Type  [employer] Medicare    Authorization Time Period  06/19/2019 to 07/17/2019; NEW 8/24- 08/04/19    Authorization - Visit Number  1    Authorization - Number of Visits  10    PT Start Time  0920    PT Stop Time  0958    PT Time Calculation (min)  38 min    Activity Tolerance  Patient tolerated treatment well    Behavior During Therapy  [facility] for tasks assessed/performed       Past Medical History:  Diagnosis Date  . Arthritis   . Cataracts, bilateral    surgery to remove cataracts - bilateral  . Chronic back pain   . Hypertension   . Stroke ([place])   . Wears dentures     Past Surgical History:  Procedure Laterality Date  . ABDOMINAL HYSTERECTOMY  82  . APPENDECTOMY    . BACK SURGERY  2010  . BACK SURGERY  2015  . BACK SURGERY  06/2016  . CATARACT EXTRACTION W/PHACO Left 01/07/2017   Procedure: CATARACT EXTRACTION PHACO AND INTRAOCULAR LENS PLACEMENT (IOC);  Surgeon: Tonny Branch, MD;  Location[facility];  Service: Ophthalmology;  Laterality: Left;  left CDE 9.31   . CATARACT EXTRACTION W/PHACO Right 01/25/2017   Procedure: CATARACT EXTRACTION PHACO AND INTRAOCULAR LENS PLACEMENT ([place]) CDE:16.07;  Surgeon: Tonny Branch, MD;  Location[facility];  Service: Ophthalmology;  Laterality: Right;  right  . FRACTURE SURGERY Right    arm  1974  . LUMBAR WOUND DEBRIDEMENT N/A 09/23/2017   Procedure: Exploration of Lumbar Wound; Repair of Pseudomeningocele;  Surgeon: Newman Pies, MD;  Location: [facility];  Service: Neurosurgery;  Laterality: N/A;   . TONSILLECTOMY    . TOTAL KNEE ARTHROPLASTY Right 05/30/2019   Procedure: RIGHT TOTAL KNEE ARTHROPLASTY;  Surgeon: Mcarthur Rossetti, MD;  Location: [facility];  Service: Orthopedics;  Laterality: Right;    There were no vitals filed for this visit.  Subjective Assessment - 07/12/19 1791    Subjective  My surgeon said my knee is looking great and is very happy with it; my regular doctor is very unhappy with my blood values and lab work. I am driving again.    Patient Stated Goals  I need t oget well so I can get back to work    Currently in Pain?  No/denies                       [facility] Adult PT Treatment/Exercise - 07/12/19 0001      Knee/Hip Exercises: Stretches   Active Hamstring Stretch  Right;3 reps;30 seconds    Active Hamstring Stretch Limitations  standing 12" box     Knee: Self-Stretch to increase Flexion  Right;10 seconds;Limitations    Knee: Self-Stretch Limitations  10 reps on 12" step    Gastroc Stretch  Both;3 reps;30 seconds    Gastroc Stretch Limitations  slant board       Knee/Hip Exercises: Aerobic  Nustep  level 3 x7 minutes, no hills LEs only    not included in billing      Knee/Hip Exercises: Standing   Heel Raises  Both;1 set;20 reps    Heel Raises Limitations  heel and toe     Forward Lunges  Right;1 set;10 reps    Forward Lunges Limitations  4 inch box     Lateral Step Up  Right;1 set;10 reps;Hand Hold: 1;Step Height: 8"    Forward Step Up  Right;1 set;10 reps;Hand Hold: 1;Step Height: 8"    Functional Squat  1 set;15 reps    Functional Squat Limitations  in front of chair       Knee/Hip Exercises: Seated   Sit to Sand  1 set;15 reps;without UE support   eccentric stand to sit      Knee/Hip Exercises: Supine   Bridges  Both;1 set;15 reps    Bridges Limitations  3 second holds       Knee/Hip Exercises: Sidelying   Hip ABduction  Both;1 set;10 reps             PT Education - 07/12/19 1001    Education Details  HEP  updates including walking program, exercise form    Person(s) Educated  Patient    Methods  Explanation;Handout    Comprehension  Verbalized understanding;Returned demonstration       PT Short Term Goals - 07/10/19 1045      PT SHORT TERM GOAL #1   Title  Patient to be compliant with HEP, to be updated PRN    Time  2    Period  Weeks    Status  Achieved      PT SHORT TERM GOAL #2   Title  Patient to be independent in edema management strategies to reduce pain and discomfort related to swelling    Time  2    Period  Weeks    Status  Achieved      PT SHORT TERM GOAL #3   Title  Patient to have gained 10 degrees of surgical knee flexion and extension in order to improve gait mechanics and reduce pain    Time  2    Period  Weeks    Status  Achieved      PT SHORT TERM GOAL #4   Title  Patient to be able to ambulate with equal stance times/step lengths, elimination of antalgic pattern, and pain no more than 2/10 in order improve community access and facilitate return to work    Time  2    Period  Weeks    Status  Partially Met        PT Long Term Goals - 07/10/19 1046      PT LONG TERM GOAL #1   Title  Patient to show MMT in all tested muscle groups as being 5/5 in order to improve functional task tolerance and reduce pain    Time  4    Period  Weeks    Status  Partially Met      PT LONG TERM GOAL #2   Title  Patient to be able to gait train 258f during 2MWT in order to show improved mobility and ease of community access    Time  4    Period  Weeks    Status  Achieved      PT LONG TERM GOAL #3   Title  Patient to be able to maintain SLS and tandem stance for equal amounts  of time in order to show improved functional balance and strength    Time  4    Period  Weeks    Status  Achieved      PT LONG TERM GOAL #4   Title  Patient to be able to tolerate 3 hours of work without increased pain in order to improve functional task tolerance    Time  4    Period  Weeks     Status  Achieved            Plan - 07/12/19 1001    Clinical Impression Statement  [name] arrives stating her surgeon is very happy with how her TKR is doing so far. Continued progressing functional strength and endurance training today as tolerated with verbal and visual cues provided intermittently for form also PRN. Difficulty with squat form today and required repeated cues and coaching. Also continues to require cues for forward progression of surgical LE over obstacles, continues to circumduct if left to natural pattern. Introduced Nustep at [employer] (not included in billing) for ongoing functional activity tolerance training.    Personal Factors and Comorbidities  Age;Fitness;Past/Current Experience;Comorbidity 1;Sex;Education;Social Background;Time since onset of injury/illness/exacerbation    Examination-Activity Limitations  Bathing;Locomotion Level;Transfers;Bed Mobility;Sit;Sleep;Squat;Stairs;Stand;Lift    Examination-Participation Restrictions  Church;Yard Work;Cleaning;Driving;Interpersonal Relationship;Laundry;Shop    Stability/Clinical Decision Making  Stable/Uncomplicated    Clinical Decision Making  Low    Rehab Potential  Excellent    PT Frequency  2x / week   6 more visits in cert   PT Duration  3 weeks    PT Treatment/Interventions  ADLs/Self Care Home Management;Cryotherapy;Electrical Stimulation;Iontophoresis '4mg'$ /ml Dexamethasone;Moist Heat;Ultrasound;DME Instruction;Neuromuscular re-education;Patient/family education;Manual techniques;Scar mobilization;Passive range of motion;Therapeutic exercise;Therapeutic activities;Functional mobility training;Stair training;Gait training;Balance training;Taping    PT Next Visit Plan  5 more visits; focus on advancing strength and endurance, balance. Continue Nustep for endurance    PT Home Exercise Plan  Eval: bridges, sit to stand, knee flexion stretch, hospital HEP; 06/30/19: SAQ, STS and walking program. 8/26- step ups, eccentric  stand to sit, walking program    Consulted and Agree with Plan of Care  Patient       Patient will benefit from skilled therapeutic intervention in order to improve the following deficits and impairments:  Abnormal gait, Decreased coordination, Decreased range of motion, Difficulty walking, Increased fascial restricitons, Decreased safety awareness, Decreased activity tolerance, Decreased skin integrity, Pain, Decreased balance, Decreased knowledge of use of DME, Improper body mechanics, Decreased mobility, Decreased strength, Increased edema  Visit Diagnosis: Difficulty in walking, not elsewhere classified  Localized edema  Muscle weakness (generalized)  Acute pain of right knee     Problem List Patient Active Problem List   Diagnosis Date Noted  . Status post total right knee replacement 05/30/2019  . Unilateral primary osteoarthritis, left knee 04/18/2019  . Unilateral primary osteoarthritis, right knee 04/18/2019  . CVA (cerebral vascular accident) ([place]) 08/04/2018  . Stroke ([place]) 08/04/2018  . Chronic back pain 08/04/2018  . Post-operative pain 09/20/2017  . 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 Lumbar stenosis with neurogenic claudication 02/04/2015  . Stiffness of joints, not elsewhere classified, multiple sites 10/27/2012  . Weakness of both legs 10/27/2012  . Difficulty in walking(719.7) 10/27/2012    Deniece Ree PT, DPT, CBIS  Supplemental Physical Therapist [facility]    Pager [phone] Acute Rehab Office [facility] [address] [place], [place], [address] Phone: [phone]   Fax:  [phone]  Name: [name] MRN: [id number] Date of Birth: [birth year]

## 2019-07-14 ENCOUNTER — Ambulatory Visit (HOSPITAL_COMMUNITY): Payer: Medicare Other | Admitting: Physical Therapy

## 2019-07-14 ENCOUNTER — Encounter (HOSPITAL_COMMUNITY): Payer: Self-pay | Admitting: Physical Therapy

## 2019-07-14 ENCOUNTER — Other Ambulatory Visit: Payer: Self-pay

## 2019-07-14 DIAGNOSIS — R600 Localized edema: Secondary | ICD-10-CM

## 2019-07-14 DIAGNOSIS — R262 Difficulty in walking, not elsewhere classified: Secondary | ICD-10-CM | POA: Diagnosis not present

## 2019-07-14 DIAGNOSIS — M6281 Muscle weakness (generalized): Secondary | ICD-10-CM

## 2019-07-14 DIAGNOSIS — M25561 Pain in right knee: Secondary | ICD-10-CM | POA: Diagnosis not present

## 2019-07-14 NOTE — Therapy (Signed)
[place], [place], [address] Phone: [phone]   Fax:  [phone]  Physical Therapy Treatment  Patient Details  Name: [name] MRN: [id number] Date of Birth: 13-Aug-1946 Referring Provider (PT): [name]    Encounter Date: 07/14/2019  PT End of Session - 07/14/19 0959    Visit Number  12    Number of Visits  16    Date for PT Re-Evaluation  08/04/19    Authorization Type  [employer] Medicare    Authorization Time Period  06/19/2019 to 07/17/2019; NEW 8/24- 08/04/19    Authorization - Visit Number  2    Authorization - Number of Visits  10    PT Start Time  0915    PT Stop Time  0955    PT Time Calculation (min)  40 min    Activity Tolerance  Patient tolerated treatment well    Behavior During Therapy  [facility] for tasks assessed/performed       Past Medical History:  Diagnosis Date  . Arthritis   . Cataracts, bilateral    surgery to remove cataracts - bilateral  . Chronic back pain   . Hypertension   . Stroke ([place])   . Wears dentures     Past Surgical History:  Procedure Laterality Date  . ABDOMINAL HYSTERECTOMY  82  . APPENDECTOMY    . BACK SURGERY  2010  . BACK SURGERY  2015  . BACK SURGERY  06/2016  . CATARACT EXTRACTION W/PHACO Left 01/07/2017   Procedure: CATARACT EXTRACTION PHACO AND INTRAOCULAR LENS PLACEMENT (IOC);  Surgeon: Tonny Branch, MD;  Location: [facility];  Service: Ophthalmology;  Laterality: Left;  left CDE 9.31   . CATARACT EXTRACTION W/PHACO Right 01/25/2017   Procedure: CATARACT EXTRACTION PHACO AND INTRAOCULAR LENS PLACEMENT ([place]) CDE:16.07;  Surgeon: Tonny Branch, MD;  Location: [facility];  Service: Ophthalmology;  Laterality: Right;  right  . FRACTURE SURGERY Right    arm  1974  . LUMBAR WOUND DEBRIDEMENT N/A 09/23/2017   Procedure: Exploration of Lumbar Wound; Repair of Pseudomeningocele;  Surgeon: Newman Pies, MD;  Location: [place];  Service: Neurosurgery;  Laterality: N/A;   . TONSILLECTOMY    . TOTAL KNEE ARTHROPLASTY Right 05/30/2019   Procedure: RIGHT TOTAL KNEE ARTHROPLASTY;  Surgeon: Mcarthur Rossetti, MD;  Location: [place];  Service: Orthopedics;  Laterality: Right;    There were no vitals filed for this visit.  Subjective Assessment - 07/14/19 0916    Subjective  I am doing well, just having a slow day and I am sore since last session. I am walking now, just to the mailbox and back which is 239f round trip and doing it twice a day.    Patient Stated Goals  I need t oget well so I can get back to work    Currently in Pain?  Yes    Pain Score  2     Pain Location  Knee    Pain Orientation  Right    Pain Descriptors / Indicators  Sore    Pain Type  Surgical pain                       [facility] Adult PT Treatment/Exercise - 07/14/19 0001      Knee/Hip Exercises: Stretches   Active Hamstring Stretch  Right;3 reps;30 seconds    Active Hamstring Stretch Limitations  standing 12" box     Knee: Self-Stretch to increase  Flexion  Right;10 seconds;Limitations    Knee: Self-Stretch Limitations  10 reps on 12" step    Gastroc Stretch  Both;3 reps;30 seconds    Gastroc Stretch Limitations  slant board       Knee/Hip Exercises: Standing   Lateral Step Up  Right;1 set;10 reps;Hand Hold: 2;Step Height: 8"    Forward Step Up  Right;1 set;10 reps;Hand Hold: 2;Step Height: 8"    Functional Squat  1 set;15 reps;3 seconds    Functional Squat Limitations  in front of chair     Rocker Board  2 minutes    Rocker Board Limitations  lateral and AP, B UE support     Other Standing Knee Exercises  hip hikes with and without swing 1x10 B with B UE support       Knee/Hip Exercises: Seated   Sit to Sand  1 set;15 reps;without UE support   eccentric stand to sit 5 second counts          Balance Exercises - 07/14/19 0932      Balance Exercises: Standing   Tandem Stance  Eyes open;Foam/compliant surface;3 reps;15 secs    SLS  Eyes open;Solid  surface;3 reps;15 secs    Tandem Gait  Forward;Retro;Intermittent upper extremity support;5 reps   half distance of // bars    Marching Limitations  1x20 blue foam     Heel Raises Limitations  1x20 blue foam         PT Education - 07/14/19 0959    Education Details  exercise form and purpose    Person(s) Educated  Patient    Methods  Explanation    Comprehension  Verbalized understanding       PT Short Term Goals - 07/10/19 1045      PT SHORT TERM GOAL #1   Title  Patient to be compliant with HEP, to be updated PRN    Time  2    Period  Weeks    Status  Achieved      PT SHORT TERM GOAL #2   Title  Patient to be independent in edema management strategies to reduce pain and discomfort related to swelling    Time  2    Period  Weeks    Status  Achieved      PT SHORT TERM GOAL #3   Title  Patient to have gained 10 degrees of surgical knee flexion and extension in order to improve gait mechanics and reduce pain    Time  2    Period  Weeks    Status  Achieved      PT SHORT TERM GOAL #4   Title  Patient to be able to ambulate with equal stance times/step lengths, elimination of antalgic pattern, and pain no more than 2/10 in order improve community access and facilitate return to work    Time  2    Period  Weeks    Status  Partially Met        PT Long Term Goals - 07/10/19 1046      PT LONG TERM GOAL #1   Title  Patient to show MMT in all tested muscle groups as being 5/5 in order to improve functional task tolerance and reduce pain    Time  4    Period  Weeks    Status  Partially Met      PT LONG TERM GOAL #2   Title  Patient to be able to gait train 239f during 2MWT  in order to show improved mobility and ease of community access    Time  4    Period  Weeks    Status  Achieved      PT LONG TERM GOAL #3   Title  Patient to be able to maintain SLS and tandem stance for equal amounts of time in order to show improved functional balance and strength    Time  4     Period  Weeks    Status  Achieved      PT LONG TERM GOAL #4   Title  Patient to be able to tolerate 3 hours of work without increased pain in order to improve functional task tolerance    Time  4    Period  Weeks    Status  Achieved            Plan - 07/14/19 1000    Clinical Impression Statement  [name] arrives stating that she is feeling sore, just having an off day but felt good after last workout and has started a walking program as per her HEP. Continued working on functional strength and endurance this session as tolerated, although did include increased focus on balance work for hip strength and general stability due to general feelings of fatigue and muscle soreness today. Also continued Nustep (not included in billing), for progression of functional activity tolerance and strength this morning.    Personal Factors and Comorbidities  Age;Fitness;Past/Current Experience;Comorbidity 1;Sex;Education;Social Background;Time since onset of injury/illness/exacerbation    Examination-Activity Limitations  Bathing;Locomotion Level;Transfers;Bed Mobility;Sit;Sleep;Squat;Stairs;Stand;Lift    Examination-Participation Restrictions  Church;Yard Work;Cleaning;Driving;Interpersonal Relationship;Laundry;Shop    Stability/Clinical Decision Making  Stable/Uncomplicated    Clinical Decision Making  Low    Rehab Potential  Excellent    PT Frequency  2x / week    PT Duration  3 weeks    PT Treatment/Interventions  ADLs/Self Care Home Management;Cryotherapy;Electrical Stimulation;Iontophoresis 41m/ml Dexamethasone;Moist Heat;Ultrasound;DME Instruction;Neuromuscular re-education;Patient/family education;Manual techniques;Scar mobilization;Passive range of motion;Therapeutic exercise;Therapeutic activities;Functional mobility training;Stair training;Gait training;Balance training;Taping    PT Next Visit Plan  4 more visits; focus on advancing strength and endurance, balance. Continue Nustep for  endurance. Need to increase time spent on balance.    PT Home Exercise Plan  Eval: bridges, sit to stand, knee flexion stretch, hospital HEP; 06/30/19: SAQ, STS and walking program. 8/26- step ups, eccentric stand to sit, walking program    Consulted and Agree with Plan of Care  Patient       Patient will benefit from skilled therapeutic intervention in order to improve the following deficits and impairments:  Abnormal gait, Decreased coordination, Decreased range of motion, Difficulty walking, Increased fascial restricitons, Decreased safety awareness, Decreased activity tolerance, Decreased skin integrity, Pain, Decreased balance, Decreased knowledge of use of DME, Improper body mechanics, Decreased mobility, Decreased strength, Increased edema  Visit Diagnosis: Difficulty in walking, not elsewhere classified  Localized edema  Muscle weakness (generalized)  Acute pain of right knee     Problem List Patient Active Problem List   Diagnosis Date Noted  . Status post total right knee replacement 05/30/2019  . Unilateral primary osteoarthritis, left knee 04/18/2019  . Unilateral primary osteoarthritis, right knee 04/18/2019  . CV[place] (cerebral vascular accident) ([place] 08/04/2018  . St[place] ([place] 08/04/2018  . Chronic back pain 08/04/2018  . Post-operative pain 09/20/2017  . 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 Lumbar stenosis with neurogenic claudication 02/04/2015  . Stiffness of joints, not elsewhere classified, multiple sites 10/27/2012  . Weakness of both legs 10/27/2012  .  Difficulty in walking(719.7) 10/27/2012    Deniece Ree PT, DPT, CBIS  Supplemental Physical Therapist [facility]    Pager [phone] Acute Rehab Office [place] [address] [place], [place], [address] Phone: [phone]   Fax:  [phone]  Name: [name] MRN: [id number] Date of Birth: [birth year]

## 2019-07-17 ENCOUNTER — Other Ambulatory Visit: Payer: Self-pay

## 2019-07-17 ENCOUNTER — Ambulatory Visit (HOSPITAL_COMMUNITY): Payer: Medicare Other | Admitting: Physical Therapy

## 2019-07-17 DIAGNOSIS — M6281 Muscle weakness (generalized): Secondary | ICD-10-CM

## 2019-07-17 DIAGNOSIS — R262 Difficulty in walking, not elsewhere classified: Secondary | ICD-10-CM

## 2019-07-17 DIAGNOSIS — M25561 Pain in right knee: Secondary | ICD-10-CM | POA: Diagnosis not present

## 2019-07-17 DIAGNOSIS — R600 Localized edema: Secondary | ICD-10-CM | POA: Diagnosis not present

## 2019-07-17 NOTE — Therapy (Signed)
[place], [place], [address] Phone: [phone]   Fax:  [phone]  Physical Therapy Treatment  Patient Details  Name: [name] MRN: [id number] Date of Birth: 02-07-1946 Referring Provider (PT): Jean Rosenthal    Encounter Date: 07/17/2019  PT End of Session - 07/17/19 1120    Visit Number  13    Number of Visits  16    Date for PT Re-Evaluation  08/04/19    Authorization Type  [employer] Medicare    Authorization Time Period  06/19/2019 to 07/17/2019; NEW 8/24- 08/04/19    Authorization - Visit Number  3    Authorization - Number of Visits  10    PT Start Time  1010    PT Stop Time  1048    PT Time Calculation (min)  38 min    Activity Tolerance  Patient tolerated treatment well    Behavior During Therapy  [facility] for tasks assessed/performed       Past Medical History:  Diagnosis Date  . Arthritis   . Cataracts, bilateral    surgery to remove cataracts - bilateral  . Chronic back pain   . Hypertension   . Stroke ([place])   . Wears dentures     Past Surgical History:  Procedure Laterality Date  . ABDOMINAL HYSTERECTOMY  82  . APPENDECTOMY    . BACK SURGERY  2010  . BACK SURGERY  2015  . BACK SURGERY  06/2016  . CATARACT EXTRACTION W/PHACO Left 01/07/2017   Procedure: CATARACT EXTRACTION PHACO AND INTRAOCULAR LENS PLACEMENT (IOC);  Surgeon: Tonny Branch, MD;  Location: [facility];  Service: Ophthalmology;  Laterality: Left;  left CDE 9.31   . CATARACT EXTRACTION W/PHACO Right 01/25/2017   Procedure: CATARACT EXTRACTION PHACO AND INTRAOCULAR LENS PLACEMENT ([place]) CDE:16.07;  Surgeon: Tonny Branch, MD;  Location: [facility];  Service: Ophthalmology;  Laterality: Right;  right  . FRACTURE SURGERY Right    arm  1974  . LUMBAR WOUND DEBRIDEMENT N/A 09/23/2017   Procedure: Exploration of Lumbar Wound; Repair of Pseudomeningocele;  Surgeon: Newman Pies, MD;  Location: [place];  Service: Neurosurgery;  Laterality: N/A;   . TONSILLECTOMY    . TOTAL KNEE ARTHROPLASTY Right 05/30/2019   Procedure: RIGHT TOTAL KNEE ARTHROPLASTY;  Surgeon: Mcarthur Rossetti, MD;  Location: [place];  Service: Orthopedics;  Laterality: Right;    There were no vitals filed for this visit.                    [place] Adult PT Treatment/Exercise - 07/17/19 0001      Knee/Hip Exercises: Stretches   Active Hamstring Stretch  Right;3 reps;30 seconds    Active Hamstring Stretch Limitations  standing 12" box     Knee: Self-Stretch to increase Flexion  Right;10 seconds;Limitations    Knee: Self-Stretch Limitations  10 reps on 12" step    Gastroc Stretch  Both;3 reps;30 seconds    Gastroc Stretch Limitations  slant board       Knee/Hip Exercises: Standing   Heel Raises  Both;1 set;20 reps    Heel Raises Limitations  heel and toe     Forward Lunges  Right;1 set;10 reps    Forward Lunges Limitations  4 inch box     Lateral Step Up  Right;1 set;10 reps;Hand Hold: 2;Step Height: 6"    Lateral Step Up Limitations  6" step    Forward Step Up  Right;1 set;10 reps;Hand Hold:  2;Step Height: 6"    Forward Step Up Limitations  6" step    Step Down  Right;1 set;Hand Hold: 1;10 reps;Step Height: 4"    Step Down Limitations  4" step    Functional Squat  1 set;15 reps;3 seconds    Gait Training  220 feet working on knee flexion, equal stride and arm swing.      Knee/Hip Exercises: Seated   Sit to Sand  1 set;15 reps;without UE support               PT Short Term Goals - 07/10/19 1045      PT SHORT TERM GOAL #1   Title  Patient to be compliant with HEP, to be updated PRN    Time  2    Period  Weeks    Status  Achieved      PT SHORT TERM GOAL #2   Title  Patient to be independent in edema management strategies to reduce pain and discomfort related to swelling    Time  2    Period  Weeks    Status  Achieved      PT SHORT TERM GOAL #3   Title  Patient to have gained 10 degrees of surgical knee flexion and  extension in order to improve gait mechanics and reduce pain    Time  2    Period  Weeks    Status  Achieved      PT SHORT TERM GOAL #4   Title  Patient to be able to ambulate with equal stance times/step lengths, elimination of antalgic pattern, and pain no more than 2/10 in order improve community access and facilitate return to work    Time  2    Period  Weeks    Status  Partially Met        PT Long Term Goals - 07/10/19 1046      PT LONG TERM GOAL #1   Title  Patient to show MMT in all tested muscle groups as being 5/5 in order to improve functional task tolerance and reduce pain    Time  4    Period  Weeks    Status  Partially Met      PT LONG TERM GOAL #2   Title  Patient to be able to gait train 270f during 2MWT in order to show improved mobility and ease of community access    Time  4    Period  Weeks    Status  Achieved      PT LONG TERM GOAL #3   Title  Patient to be able to maintain SLS and tandem stance for equal amounts of time in order to show improved functional balance and strength    Time  4    Period  Weeks    Status  Achieved      PT LONG TERM GOAL #4   Title  Patient to be able to tolerate 3 hours of work without increased pain in order to improve functional task tolerance    Time  4    Period  Weeks    Status  Achieved            Plan - 07/17/19 1124    Clinical Impression Statement  Noted continued stiffness in Rt knee, expecially with transitional movements and gait.  Educated on importance of bending the knee, making larger steps and worked on this at the end of session.  pt able to increase to  4" step downs this session working on eccentric control.  Added squats with cues for form and lunges without UE to work on stabiltiy and strength.  No complaints of pain or any other issues during treatment today.    Personal Factors and Comorbidities  Age;Fitness;Past/Current Experience;Comorbidity 1;Sex;Education;Social Background;Time since onset of  injury/illness/exacerbation    Examination-Activity Limitations  Bathing;Locomotion Level;Transfers;Bed Mobility;Sit;Sleep;Squat;Stairs;Stand;Lift    Examination-Participation Restrictions  Church;Yard Work;Cleaning;Driving;Interpersonal Relationship;Laundry;Shop    Stability/Clinical Decision Making  Stable/Uncomplicated    Rehab Potential  Excellent    PT Frequency  2x / week    PT Duration  3 weeks    PT Treatment/Interventions  ADLs/Self Care Home Management;Cryotherapy;Electrical Stimulation;Iontophoresis 61m/ml Dexamethasone;Moist Heat;Ultrasound;DME Instruction;Neuromuscular re-education;Patient/family education;Manual techniques;Scar mobilization;Passive range of motion;Therapeutic exercise;Therapeutic activities;Functional mobility training;Stair training;Gait training;Balance training;Taping    PT Next Visit Plan  3 more visits focusing on strengthening.  Discontinue stretches next session and add vector stance, tandem stance, SLS and walking on line.  Decrease to no step completing lunges without UE and add laterals as well.  Can do nustep at [facility] for endurance.    PT Home Exercise Plan  Eval: bridges, sit to stand, knee flexion stretch, hospital HEP; 06/30/19: SAQ, STS and walking program. 8/26- step ups, eccentric stand to sit, walking program    Consulted and Agree with Plan of Care  Patient       Patient will benefit from skilled therapeutic intervention in order to improve the following deficits and impairments:  Abnormal gait, Decreased coordination, Decreased range of motion, Difficulty walking, Increased fascial restricitons, Decreased safety awareness, Decreased activity tolerance, Decreased skin integrity, Pain, Decreased balance, Decreased knowledge of use of DME, Improper body mechanics, Decreased mobility, Decreased strength, Increased edema  Visit Diagnosis: Difficulty in walking, not elsewhere classified  Muscle weakness (generalized)     Problem List Patient Active  Problem List   Diagnosis Date Noted  . Status post total right knee replacement 05/30/2019  . Unilateral primary osteoarthritis, left knee 04/18/2019  . Unilateral primary osteoarthritis, right knee 04/18/2019  . [place]) [place] 08/04[place]  . [place] 08/04/[place]  . Chronic back pain 08/04/2018  . Post-operative pain 09/20/2017  . 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 Lumbar stenosis with neurogenic claudication 02/04/2015  . Stiffness of joints, not elsewhere classified, multiple sites 10/27/2012  . Weakness of both legs 10/27/2012  . Difficulty in walking(719.7) 10/27/2012   ATeena Irani PTA/CLT [phone] FTeena Irani8/31/2020, 11:29 AM  [place] [address]Phone: [phone]  Fax:  [phone] Name: Dawn PATRICELLIMRN: [id number] of Birth: 3Mar 31, 1947

## 2019-07-20 ENCOUNTER — Encounter (HOSPITAL_COMMUNITY): Payer: Self-pay

## 2019-07-20 ENCOUNTER — Other Ambulatory Visit: Payer: Self-pay

## 2019-07-20 ENCOUNTER — Ambulatory Visit (HOSPITAL_COMMUNITY): Payer: Medicare Other | Attending: Orthopaedic Surgery

## 2019-07-20 DIAGNOSIS — M25561 Pain in right knee: Secondary | ICD-10-CM | POA: Diagnosis not present

## 2019-07-20 DIAGNOSIS — M6281 Muscle weakness (generalized): Secondary | ICD-10-CM | POA: Diagnosis not present

## 2019-07-20 DIAGNOSIS — R262 Difficulty in walking, not elsewhere classified: Secondary | ICD-10-CM | POA: Diagnosis not present

## 2019-07-20 DIAGNOSIS — R600 Localized edema: Secondary | ICD-10-CM | POA: Diagnosis not present

## 2019-07-20 NOTE — Therapy (Signed)
[place], [place], [address] Phone: [phone]   Fax:  [phone]  Physical Therapy Treatment  Patient Details  Name: [name] MRN: [id number] Date of Birth: [birth year] Referring Provider (PT): Jean Rosenthal    Encounter Date: 07/20/2019  PT End of Session - 07/20/19 1218    Visit Number  14    Number of Visits  16    Date for PT Re-Evaluation  08/04/19    Authorization Type  [employer] Medicare    Authorization Time Period  06/19/2019 to 07/17/2019; NEW 8/24- 08/04/19    Authorization - Visit Number  3    Authorization - Number of Visits  10    PT Start Time  6734    PT Stop Time  1214    PT Time Calculation (min)  38 min    Activity Tolerance  Patient tolerated treatment well    Behavior During Therapy  [facility] for tasks assessed/performed       Past Medical History:  Diagnosis Date  . Arthritis   . Cataracts, bilateral    surgery to remove cataracts - bilateral  . Chronic back pain   . Hypertension   . Stroke ([place])   . Wears dentures     Past Surgical History:  Procedure Laterality Date  . ABDOMINAL HYSTERECTOMY  82  . APPENDECTOMY    . BACK SURGERY  2010  . BACK SURGERY  2015  . BACK SURGERY  06/2016  . CATARACT EXTRACTION W/PHACO Left 01/07/2017   Procedure: CATARACT EXTRACTION PHACO AND INTRAOCULAR LENS PLACEMENT (IOC);  Surgeon: Tonny Branch, MD;  Location: [facility];  Service: Ophthalmology;  Laterality: Left;  left CDE 9.31   . CATARACT EXTRACTION W/PHACO Right 01/25/2017   Procedure: CATARACT EXTRACTION PHACO AND INTRAOCULAR LENS PLACEMENT ([place]) CDE:16.07;  Surgeon: Tonny Branch, MD;  Location: [facility];  Service: Ophthalmology;  Laterality: Right;  right  . FRACTURE SURGERY Right    arm  1974  . LUMBAR WOUND DEBRIDEMENT N/A 09/23/2017   Procedure: Exploration of Lumbar Wound; Repair of Pseudomeningocele;  Surgeon: Newman Pies, MD;  Location: [place];  Service: Neurosurgery;  Laterality: N/A;   . TONSILLECTOMY    . TOTAL KNEE ARTHROPLASTY Right 05/30/2019   Procedure: RIGHT TOTAL KNEE ARTHROPLASTY;  Surgeon: Mcarthur Rossetti, MD;  Location: [place];  Service: Orthopedics;  Laterality: Right;    There were no vitals filed for this visit.  Subjective Assessment - 07/20/19 1219    Subjective  Patient reports she is doing well and her exercises are still good. She is a little sore but not too bad and feels she is getting stronger.    Patient Stated Goals  I need t oget well so I can get back to work    Currently in Pain?  No/denies       [facility] Adult PT Treatment/Exercise - 07/20/19 0001      Knee/Hip Exercises: Aerobic   Nustep  at [facility] for aerobic 5 minutes, level 4 (not billed)      Knee/Hip Exercises: Standing   Forward Step Up  Both;10 reps;Hand Hold: 1;Step Height: 6"    Step Down  Step Height: 2";Both;10 reps;Hand Hold: 2        Balance Exercises - 07/20/19 1152      Balance Exercises: Standing   Tandem Stance  Eyes open;Foam/compliant surface;Other reps (comment);4 reps   palof press, bil and pull from each direction RTB x10    SLS  Eyes open;Solid surface;Other reps (comment)   1x 10 reps bil LE, 3 cone taps each   Balance Beam  Forward tandem on foam 2x RT; forward gait on foam 2x RT with 4(6"0 hurdles to step over; side stepping on foam 2x RT with 4(6") hurdles step over    Tandem Gait  Forward;4 reps   4x 15' RT, solid surface   Other Standing Exercises  --        PT Education - 07/20/19 1224    Education Details  Educated on weight shifting for balance activity. Educated on purpose of interventions.    Person(s) Educated  Patient    Methods  Explanation;Demonstration;Tactile cues    Comprehension  Verbalized understanding;Need further instruction;Tactile cues required       PT Short Term Goals - 07/10/19 1045      PT SHORT TERM GOAL #1   Title  Patient to be compliant with HEP, to be updated PRN    Time  2    Period  Weeks    Status   Achieved      PT SHORT TERM GOAL #2   Title  Patient to be independent in edema management strategies to reduce pain and discomfort related to swelling    Time  2    Period  Weeks    Status  Achieved      PT SHORT TERM GOAL #3   Title  Patient to have gained 10 degrees of surgical knee flexion and extension in order to improve gait mechanics and reduce pain    Time  2    Period  Weeks    Status  Achieved      PT SHORT TERM GOAL #4   Title  Patient to be able to ambulate with equal stance times/step lengths, elimination of antalgic pattern, and pain no more than 2/10 in order improve community access and facilitate return to work    Time  2    Period  Weeks    Status  Partially Met        PT Long Term Goals - 07/10/19 1046      PT LONG TERM GOAL #1   Title  Patient to show MMT in all tested muscle groups as being 5/5 in order to improve functional task tolerance and reduce pain    Time  4    Period  Weeks    Status  Partially Met      PT LONG TERM GOAL #2   Title  Patient to be able to gait train 292f during 2MWT in order to show improved mobility and ease of community access    Time  4    Period  Weeks    Status  Achieved      PT LONG TERM GOAL #3   Title  Patient to be able to maintain SLS and tandem stance for equal amounts of time in order to show improved functional balance and strength    Time  4    Period  Weeks    Status  Achieved      PT LONG TERM GOAL #4   Title  Patient to be able to tolerate 3 hours of work without increased pain in order to improve functional task tolerance    Time  4    Period  Weeks    Status  Achieved        Plan - 07/20/19 1225    Clinical Impression Statement  Session focused on balance  with gait activities. Pt initiated tandem gait and progressed from solid to compliant surface with min assist and tactile cues to weight shift in order to maintain balance. Patient demonstrated good stepping strategies to prevent LOB and reduced  UE reaching with verbal cues. She progressed SLS challenge to cone taps and was limited by Lt knee pain therefore exercise was alternated between LE's. Patient also initiated step downs for quad strengthening and required tactile cues to facilitate knee flexion and deter hip drop from 2" step. Patient will continue to benefit from skilled PT interventions to progress towards LTG's and improve QOL.    Personal Factors and Comorbidities  Age;Fitness;Past/Current Experience;Comorbidity 1;Sex;Education;Social Background;Time since onset of injury/illness/exacerbation    Examination-Activity Limitations  Bathing;Locomotion Level;Transfers;Bed Mobility;Sit;Sleep;Squat;Stairs;Stand;Lift    Examination-Participation Restrictions  Church;Yard Work;Cleaning;Driving;Interpersonal Relationship;Laundry;Shop    Stability/Clinical Decision Making  Stable/Uncomplicated    Rehab Potential  Excellent    PT Frequency  2x / week    PT Duration  3 weeks    PT Treatment/Interventions  ADLs/Self Care Home Management;Cryotherapy;Electrical Stimulation;Iontophoresis 86m/ml Dexamethasone;Moist Heat;Ultrasound;DME Instruction;Neuromuscular re-education;Patient/family education;Manual techniques;Scar mobilization;Passive range of motion;Therapeutic exercise;Therapeutic activities;Functional mobility training;Stair training;Gait training;Balance training;Taping    PT Next Visit Plan  2 more visits focusing on strengthening and dynamic gait balance.  Discontinue stretches next session and add vector stance, tandem stance, SLS and walking on line.  Decrease to no step completing lunges without UE and add laterals as well.  Can do nustep at [facility] for endurance.    PT Home Exercise Plan  Eval: bridges, sit to stand, knee flexion stretch, hospital HEP; 06/30/19: SAQ, STS and walking program. 8/26- step ups, eccentric stand to sit, walking program    Consulted and Agree with Plan of Care  Patient       Patient will benefit from skilled  therapeutic intervention in order to improve the following deficits and impairments:  Abnormal gait, Decreased coordination, Decreased range of motion, Difficulty walking, Increased fascial restricitons, Decreased safety awareness, Decreased activity tolerance, Decreased skin integrity, Pain, Decreased balance, Decreased knowledge of use of DME, Improper body mechanics, Decreased mobility, Decreased strength, Increased edema  Visit Diagnosis: Difficulty in walking, not elsewhere classified  Muscle weakness (generalized)  Localized edema  Acute pain of right knee     Problem List Patient Active Problem List   Diagnosis Date Noted  . Status post total right knee replacement 05/30/2019  . Unilateral primary osteoarthritis, left knee 04/18/2019  . Unilateral primary osteoarthritis, right knee 04/18/2019  . CV[place] (cerebral vascular accident) ([place] 08/04/2018  . Stroke ([facility] 08/04/2018  . Chronic back pain 08/04/2018  . Post-operative pain 09/20/2017  . 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 Lumbar stenosis with neurogenic claudication 02/04/2015  . Stiffness of joints, not elsewhere classified, multiple sites 10/27/2012  . Weakness of both legs 10/27/2012  . Difficulty in walking(719.7) 10/27/2012    RKipp Brood PT, DPT, WWas[facility] LLCPhysical Therapist with [facility] 07/20/2019 12:25 PM    CLadera Ranch78 Vale StreetSLake Cass[place] [address]Phone: [phone]  Fax:  [phone] Name: Dawn GREENSPANMRN: [id number] of Birth: 313-Oct-1947

## 2019-07-25 ENCOUNTER — Ambulatory Visit (HOSPITAL_COMMUNITY): Payer: Medicare Other

## 2019-07-25 ENCOUNTER — Encounter (HOSPITAL_COMMUNITY): Payer: Self-pay

## 2019-07-25 ENCOUNTER — Other Ambulatory Visit: Payer: Self-pay

## 2019-07-25 DIAGNOSIS — R262 Difficulty in walking, not elsewhere classified: Secondary | ICD-10-CM | POA: Diagnosis not present

## 2019-07-25 DIAGNOSIS — R600 Localized edema: Secondary | ICD-10-CM

## 2019-07-25 DIAGNOSIS — M25561 Pain in right knee: Secondary | ICD-10-CM

## 2019-07-25 DIAGNOSIS — M6281 Muscle weakness (generalized): Secondary | ICD-10-CM | POA: Diagnosis not present

## 2019-07-25 NOTE — Therapy (Addendum)
[place] [address] [place], [place], [address] Phone: [phone]   Fax:  [phone]  Physical Therapy Treatment  Patient Details  Name: [name] MRN: [id number] Date of Birth: 01-Sep-1946 Referring Provider (PT): Jean Rosenthal    Encounter Date: 07/25/2019  PT End of Session - 07/25/19 0950    Visit Number  15    Number of Visits  16    Date for PT Re-Evaluation  08/04/19    Authorization Type  [employer] Medicare    Authorization Time Period  06/19/2019 to 07/17/2019; NEW 8/24- 08/04/19    Authorization - Visit Number  4    Authorization - Number of Visits  10    PT Start Time  0905    PT Stop Time  0944    PT Time Calculation (min)  39 min    Equipment Utilized During Treatment  Gait belt    Activity Tolerance  Patient tolerated treatment well    Behavior During Therapy  [facility] for tasks assessed/performed       Past Medical History:  Diagnosis Date  . Arthritis   . Cataracts, bilateral    surgery to remove cataracts - bilateral  . Chronic back pain   . Hypertension   . Stroke ([place])   . Wears dentures     Past Surgical History:  Procedure Laterality Date  . ABDOMINAL HYSTERECTOMY  82  . APPENDECTOMY    . BACK SURGERY  2010  . BACK SURGERY  2015  . BACK SURGERY  06/2016  . CATARACT EXTRACTION W/PHACO Left 01/07/2017   Procedure: CATARACT EXTRACTION PHACO AND INTRAOCULAR LENS PLACEMENT (IOC);  Surgeon: Tonny Branch, MD;  [facility];  Service: Ophthalmology;  Laterality: Left;  left CDE 9.31   . CATARACT EXTRACTION W/PHACO Right 01/25/2017   Procedure: CATARACT EXTRACTION PHACO AND INTRAOCULAR LENS PLACEMENT ([place]) CDE:16.07;  Surgeon: Tonny Branch, MD;  [facility];  Service: Ophthalmology;  Laterality: Right;  right  . FRACTURE SURGERY Right    arm  1974  . LUMBAR WOUND DEBRIDEMENT N/A 09/23/2017   Procedure: Exploration of Lumbar Wound; Repair of Pseudomeningocele;  Surgeon: Newman Pies, MD;  Location:  [place];  Service: Neurosurgery;  Laterality: N/A;  . TONSILLECTOMY    . TOTAL KNEE ARTHROPLASTY Right 05/30/2019   Procedure: RIGHT TOTAL KNEE ARTHROPLASTY;  Surgeon: Mcarthur Rossetti, MD;  Location: [place];  Service: Orthopedics;  Laterality: Right;    There were no vitals filed for this visit.  Subjective Assessment - 07/25/19 0907    Subjective  Pt reports she was active in the garden this weekend, but couldn't walk due to soreness for 2 days after.    How long can you sit comfortably?  8/24- 60 minutes    How long can you stand comfortably?  8/24- 30 minutes    How long can you walk comfortably?  8/24- 15-20 minutes    Patient Stated Goals  I need t oget well so I can get back to work    Currently in Pain?  Yes    Pain Score  2     Pain Location  Knee    Pain Orientation  Left    Pain Descriptors / Indicators  Sore;Aching    Pain Type  Chronic pain    Pain Onset  More than a month ago    Pain Frequency  Constant    Aggravating Factors   standing too long    Pain Relieving  Factors  ice    Effect of Pain on Daily Activities  moderate              [facility] Adult PT Treatment/Exercise - 07/25/19 0001      Knee/Hip Exercises: Stretches   Active Hamstring Stretch  Both;1 rep;30 seconds          Balance Exercises - 07/25/19 0945      Balance Exercises: Standing   Tandem Stance  Eyes open;Foam/compliant surface;Other reps (comment)   paloff press, BUE flexion, x10 reps each LE back   SLS  Eyes open;Solid surface;Other reps (comment)   x10 reps bil LE, star drill 3 cones   Balance Beam  sidestepping on blue line, x2RT; stepping over 4 6-inch hurdles, forward and sidestepping x2RT each    Tandem Gait  Forward;2 reps   2x 15' RT, solid surface   Marching Limitations  // bars, 3 sec hold, x20 reps, intermittent UE assist    Other Standing Exercises  weighted walking, forward/retro, 20# x5RT, 30# x5RT        PT Education - 07/25/19 0950    Education Details   Exercise technique, finalizing HEP and d/c next session    Person(s) Educated  Patient    Methods  Explanation    Comprehension  Verbalized understanding       PT Short Term Goals - 07/10/19 1045      PT SHORT TERM GOAL #1   Title  Patient to be compliant with HEP, to be updated PRN    Time  2    Period  Weeks    Status  Achieved      PT SHORT TERM GOAL #2   Title  Patient to be independent in edema management strategies to reduce pain and discomfort related to swelling    Time  2    Period  Weeks    Status  Achieved      PT SHORT TERM GOAL #3   Title  Patient to have gained 10 degrees of surgical knee flexion and extension in order to improve gait mechanics and reduce pain    Time  2    Period  Weeks    Status  Achieved      PT SHORT TERM GOAL #4   Title  Patient to be able to ambulate with equal stance times/step lengths, elimination of antalgic pattern, and pain no more than 2/10 in order improve community access and facilitate return to work    Time  2    Period  Weeks    Status  Partially Met        PT Long Term Goals - 07/10/19 1046      PT LONG TERM GOAL #1   Title  Patient to show MMT in all tested muscle groups as being 5/5 in order to improve functional task tolerance and reduce pain    Time  4    Period  Weeks    Status  Partially Met      PT LONG TERM GOAL #2   Title  Patient to be able to gait train 247f during 2MWT in order to show improved mobility and ease of community access    Time  4    Period  Weeks    Status  Achieved      PT LONG TERM GOAL #3   Title  Patient to be able to maintain SLS and tandem stance for equal amounts of time in order to show improved  functional balance and strength    Time  4    Period  Weeks    Status  Achieved      PT LONG TERM GOAL #4   Title  Patient to be able to tolerate 3 hours of work without increased pain in order to improve functional task tolerance    Time  4    Period  Weeks    Status  Achieved             Plan - 07/25/19 0950    Clinical Impression Statement  Pt limited secondary to soreness in L knee from increased activity this weekend. Pt performed tandem gait on blue line performing protective stepping to maintain upright balance 50% of the time. Pt able to perform weight walking forward and backward without significant balance challenge, returning with good control and stepping backward without loss of balance. Pt continues to perform side stepping and stepping over hurdles with fair balance, demonstrating mild unsteadiness with trunk righting and occasional protective stepping. Pt attempted marching inside parallel bars with 3 second isometric hold to further challenge balance, required intermittent UE assist to maintain balance. Pt to get L knee steroid injection ASAP to alleviate pain and in hopes to delay TKA until 2021. Pt appropriate to discharge next visit to HEP due to progress and confidence in maintaining gains at home. Continue to progress as able.    Personal Factors and Comorbidities  Age;Fitness;Past/Current Experience;Comorbidity 1;Sex;Education;Social Background;Time since onset of injury/illness/exacerbation    Examination-Activity Limitations  Bathing;Locomotion Level;Transfers;Bed Mobility;Sit;Sleep;Squat;Stairs;Stand;Lift    Examination-Participation Restrictions  Church;Yard Work;Cleaning;Driving;Interpersonal Relationship;Laundry;Shop    Stability/Clinical Decision Making  Stable/Uncomplicated    Rehab Potential  Excellent    PT Frequency  2x / week    PT Duration  3 weeks    PT Treatment/Interventions  ADLs/Self Care Home Management;Cryotherapy;Electrical Stimulation;Iontophoresis 92m/ml Dexamethasone;Moist Heat;Ultrasound;DME Instruction;Neuromuscular re-education;Patient/family education;Manual techniques;Scar mobilization;Passive range of motion;Therapeutic exercise;Therapeutic activities;Functional mobility training;Stair training;Gait training;Balance  training;Taping    PT Next Visit Plan  finalize HEP and discharge    PT Home Exercise Plan  Eval: bridges, sit to stand, knee flexion stretch, hospital HEP; 06/30/19: SAQ, STS and walking program. 8/26- step ups, eccentric stand to sit, walking program    Consulted and Agree with Plan of Care  Patient       Patient will benefit from skilled therapeutic intervention in order to improve the following deficits and impairments:  Abnormal gait, Decreased coordination, Decreased range of motion, Difficulty walking, Increased fascial restricitons, Decreased safety awareness, Decreased activity tolerance, Decreased skin integrity, Pain, Decreased balance, Decreased knowledge of use of DME, Improper body mechanics, Decreased mobility, Decreased strength, Increased edema  Visit Diagnosis: Acute pain of right knee  Difficulty in walking, not elsewhere classified  Localized edema  Muscle weakness (generalized)     Problem List Patient Active Problem List   Diagnosis Date Noted  . Status post total right knee replacement 05/30/2019  . Unilateral primary osteoarthritis, left knee 04/18/2019  . Unilateral primary osteoarthritis, right knee 04/18/2019  . CV[place] ([place] vascular accident) ([place] 08/04[place]  . Stroke ([place] 08/04/2018  . Chronic back pain 08/04/2018  . Post-operative pain 09/20/2017  . 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 Lumbar stenosis with neurogenic claudication 02/04/2015  . Stiffness of joints, not elsewhere classified, multiple sites 10/27/2012  . Weakness of both legs 10/27/2012  . Difficulty in walking(719.7) 10/27/2012     TTalbot GrumblingPT, DPT 07/25/19, 9:52 AM [address]  [place], [place], [address] Phone: [phone]   Fax:  [phone]  Name: FRANCETTA ILG MRN: [id number] Date of Birth: Sep 29, 1946

## 2019-07-26 ENCOUNTER — Ambulatory Visit (HOSPITAL_COMMUNITY): Payer: Medicare Other

## 2019-07-27 ENCOUNTER — Encounter (HOSPITAL_COMMUNITY): Payer: Self-pay

## 2019-07-27 ENCOUNTER — Other Ambulatory Visit: Payer: Self-pay

## 2019-07-27 ENCOUNTER — Ambulatory Visit (HOSPITAL_COMMUNITY): Payer: Medicare Other

## 2019-07-27 DIAGNOSIS — M6281 Muscle weakness (generalized): Secondary | ICD-10-CM | POA: Diagnosis not present

## 2019-07-27 DIAGNOSIS — M25561 Pain in right knee: Secondary | ICD-10-CM | POA: Diagnosis not present

## 2019-07-27 DIAGNOSIS — R262 Difficulty in walking, not elsewhere classified: Secondary | ICD-10-CM

## 2019-07-27 DIAGNOSIS — R600 Localized edema: Secondary | ICD-10-CM | POA: Diagnosis not present

## 2019-07-27 NOTE — Patient Instructions (Signed)
Standing 3-Way Kick reps: 10 sets: 2 hold: 5 second holds daily: 1  weekly: 3-4      Exercise image step 1   Exercise image step 2   Exercise image step 3  Setup  Begin in a standing upright position. Movement  Balancing on one leg, slowly lift your opposite leg straight forward, out to your side, and backward. Repeat. Tip  Make sure to keep your toes pointing forward and your leg straight during the exercise. Lateral Step Up reps: 10 sets: 2 daily: 1 weekly: 3-4   Exercise image step 1   Exercise image step 2   Exercise image step 3  Setup  Begin in a standing upright position with a step to your side. Movement  Step up with the foot closest to the step, then follow with you other foot. Step back down in the opposite order and repeat. Tip  Make sure to maintain your balance during the exercise and try to keep your hips level.

## 2019-07-27 NOTE — Therapy (Signed)
[place], [place], [address] Phone: [phone]   Fax:  [phone]  Physical Therapy Treatment & Discharge Summary  Patient Details  Name: [name] MRN: [id number] Date of Birth: [birth year] Referring Provider (PT): Jean Rosenthal    Encounter Date: 07/27/2019   PHYSICAL THERAPY DISCHARGE SUMMARY  Visits from Start of Care: 16  Current functional level related to goals / functional outcomes: Re-assessment performed today and patient has met/partially met all short and long term goals with exception of SLS which remains limited for Bil LE. She did make significant improvements in Bil LE strength based on MMT and 5x S<>S and her 2MWT improved to 0.9 m/s for gait speed. She was educated on additional exercises to continue strengthening limited muscle groups and to improve her balance. [name] has reported feeling her Rt knee is 100% improved since beginning therapy and she has returned to gardening with minimal difficulty. She is confident in her ability to continue progressing independently. Patient will be discharged from this episode of care.   Remaining deficits: See below details   Education / Equipment: Educated on progress towards goals and current ROM and strength measures. Edcuated on updated HEP to conitnue progressing strength and balance at home.Educated on progress towards goals and current ROM and strength measures. Edcuated on updated HEP to conitnue progressing strength and balance at home.  Plan: Patient agrees to discharge.  Patient goals were met. Patient is being discharged due to meeting the stated rehab goals.  ?????       PT End of Session - 07/27/19 0958    Visit Number  16    Number of Visits  16    Date for PT Re-Evaluation  08/04/19    Authorization Type  [employer] Medicare    Authorization Time Period  06/19/2019 to 07/17/2019; NEW 8/24- 08/04/19    Authorization - Visit Number  5     Authorization - Number of Visits  10    PT Start Time  0920    PT Stop Time  4944   d/c ended early   PT Time Calculation (min)  32 min    Equipment Utilized During Treatment  Gait belt    Activity Tolerance  Patient tolerated treatment well    Behavior During Therapy  WFL for tasks assessed/performed       Past Medical History:  Diagnosis Date  . Arthritis   . Cataracts, bilateral    surgery to remove cataracts - bilateral  . Chronic back pain   . Hypertension   . Stroke ([place])   . Wears dentures     Past Surgical History:  Procedure Laterality Date  . ABDOMINAL HYSTERECTOMY  82  . APPENDECTOMY    . BACK SURGERY  2010  . BACK SURGERY  2015  . BACK SURGERY  06/2016  . CATARACT EXTRACTION W/PHACO Left 01/07/2017   Procedure: CATARACT EXTRACTION PHACO AND INTRAOCULAR LENS PLACEMENT (IOC);  Surgeon: Tonny Branch, MD;  Location: [facility];  Service: Ophthalmology;  Laterality: Left;  left CDE 9.31   . CATARACT EXTRACTION W/PHACO Right 01/25/2017   Procedure: CATARACT EXTRACTION PHACO AND INTRAOCULAR LENS PLACEMENT ([place]) CDE:16.07;  Surgeon: Tonny Branch, MD;  Location: [facility];  Service: Ophthalmology;  Laterality: Right;  right  . FRACTURE SURGERY Right    arm  1974  . LUMBAR WOUND DEBRIDEMENT N/A 09/23/2017   Procedure: Exploration of Lumbar Wound; Repair of Pseudomeningocele;  Surgeon: Newman Pies, MD;  Location: [facility];  Service: Neurosurgery;  Laterality: N/A;  . TONSILLECTOMY    . TOTAL KNEE ARTHROPLASTY Right 05/30/2019   Procedure: RIGHT TOTAL KNEE ARTHROPLASTY;  Surgeon: Mcarthur Rossetti, MD;  Location: [facility];  Service: Orthopedics;  Laterality: Right;    There were no vitals filed for this visit.  Subjective Assessment - 07/27/19 0921    Subjective  Patient reporst some soreness in both knees today. She reports she did her gardening last weekend but that was rough for a couple days after. She states her Lt knee did not feel well after. She is excited its her last  day and feels she has made 100% improvement with her Rt knee.    How long can you sit comfortably?  8/24- 60 minutes    How long can you stand comfortably?  8/24- 30 minutes    How long can you walk comfortably?  8/24- 15-20 minutes    Patient Stated Goals  I need t oget well so I can get back to work    Currently in Pain?  Yes    Pain Score  2     Pain Location  Knee    Pain Orientation  Right;Left;Anterior    Pain Descriptors / Indicators  Aching;Sore    Pain Type  Chronic pain    Pain Onset  More than a month ago    Pain Frequency  Constant    Aggravating Factors   standing, kneeling, siting too long    Pain Relieving Factors  ice         [facility] PT Assessment - 07/27/19 0001      Assessment   Medical Diagnosis  s/p R TKR     Referring Provider (PT)  Jean Rosenthal     Onset Date/Surgical Date  06/02/19    Next MD Visit  Dr. 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     Leisure  outdoor activities, sewing, going to car shows       Observation/Other Assessments   Focus on Therapeutic Outcomes (FOTO)   35% limited    was 60% limited     AROM   Right Knee Extension  0   3   Right Knee Flexion  120   118     Strength   Right Hip Flexion  4/5   3   Right Hip Extension  4/5   3-   Right Hip ABduction  4+/5   3-   Left Hip Flexion  4/5   3+   Left Hip Extension  4/5   3-   Left Hip ABduction  4+/5   3   Right Knee Flexion  4+/5   4+   Right Knee Extension  4+/5   5   Left Knee Flexion  4+/5   4+   Left Knee Extension  5/5   5   Right Ankle Dorsiflexion  5/5   5   Left Ankle Dorsiflexion  5/5   5     Transfers   Five time sit to stand comments   10.1 seconds without UE use  was 14.8 B UEs on thighs      Ambulation/Gait   Ambulation/Gait  Yes    Ambulation/Gait Assistance  7: Independent    Ambulation Distance (Feet)  370 Feet   2MWT (was 313)   Assistive device  None    Gait Pattern  Step-through pattern;Decreased stride length;Right flexed knee in stance;Antalgic;Decreased trunk rotation;Trunk flexed;Wide base of support    Ambulation Surface  Level;Indoor    Gait velocity  0.93 m/s    Gait Comments  2MWT 368f no device       High Level Balance   High Level Balance Comments  --        [facility] Adult PT Treatment/Exercise - 07/27/19 0001      Knee/Hip Exercises: Standing   Lateral Step Up  Both;1 set;10 reps;Step Height: 4";Hand Hold: 1    Other Standing Knee Exercises  5x bil LE: 3 way vector SLS with 1 UE support at counter       PT Education - 07/27/19 0950    Education Details  Educated on progress towards goals and current ROM and strength measures. Edcuated on updated HEP to conitnue progressing strength and balance at home.    Person(s) Educated  Patient    Methods  Explanation;Handout    Comprehension  Verbalized understanding;Returned demonstration       PT Short Term Goals - 07/27/19 0942      PT SHORT TERM GOAL #1   Title  Patient to be compliant with HEP, to be updated PRN    Time  2    Period  Weeks    Status  Achieved      PT SHORT TERM GOAL #2   Title  Patient to be independent in edema management strategies to reduce pain and discomfort related to swelling    Time  2    Period  Weeks    Status  Achieved      PT SHORT TERM GOAL #3   Title  Patient to have gained 10 degrees of surgical knee flexion and extension in order to improve gait mechanics and reduce pain    Time  2    Period  Weeks    Status  Achieved      PT SHORT TERM GOAL #4   Title  Patient to be able to ambulate with equal stance times/step lengths, elimination of antalgic pattern, and pain no more than 2/10 in order improve community access and  facilitate return to work    Time  2    Period  Weeks    Status  Achieved        PT Long Term Goals - 07/27/19 0942      PT LONG TERM GOAL #1   Title  Patient to show MMT in all tested muscle groups as being 5/5 in order to improve functional task tolerance and reduce pain    Time  4    Period  Weeks    Status  Partially Met      PT LONG TERM GOAL #2   Title  Patient to be able to gait train 2535fduring 2MWT in order to show improved mobility and ease of community access    Time  4    Period  Weeks    Status  Achieved      PT LONG TERM GOAL #3   Title  Patient to be able to maintain SLS and tandem stance for equal amounts of time in order to show improved  functional balance and strength    Time  4    Period  Weeks    Status  Not Met      PT LONG TERM GOAL #4   Title  Patient to be able to tolerate 3 hours of work without increased pain in order to improve functional task tolerance    Time  4    Period  Weeks    Status  Achieved        Plan - 07/27/19 0958    Clinical Impression Statement  Re-assessment performed today and patient has met/partially met all short and long term goals with exception of SLS which remains limited for Bil LE. She did make significant improvements in Bil LE strength based on MMT and 5x S<>S and her 2MWT improved to 0.9 m/s for gait speed. She was educated on additional exercises to continue strengthening limited muscle groups and to improve her balance. Ms. Lim has reported feeling her Rt knee is 100% improved since beginning therapy and she has returned to gardening with minimal difficulty. She is confident in her ability to continue progressing independently. Patient will be discharged from this episode of care.    Personal Factors and Comorbidities  Age;Fitness;Past/Current Experience;Comorbidity 1;Sex;Education;Social Background;Time since onset of injury/illness/exacerbation    Examination-Activity Limitations  Bathing;Locomotion  Level;Transfers;Bed Mobility;Sit;Sleep;Squat;Stairs;Stand;Lift    Examination-Participation Restrictions  Church;Yard Work;Cleaning;Driving;Interpersonal Relationship;Laundry;Shop    Stability/Clinical Decision Making  Stable/Uncomplicated    Rehab Potential  Excellent    PT Frequency  2x / week    PT Duration  3 weeks    PT Treatment/Interventions  ADLs/Self Care Home Management;Cryotherapy;Electrical Stimulation;Iontophoresis 6m/ml Dexamethasone;Moist Heat;Ultrasound;DME Instruction;Neuromuscular re-education;Patient/family education;Manual techniques;Scar mobilization;Passive range of motion;Therapeutic exercise;Therapeutic activities;Functional mobility training;Stair training;Gait training;Balance training;Taping    PT Next Visit Plan  discharged this date    PT Home Exercise Plan  Eval: bridges, sit to stand, knee flexion stretch, hospital HEP; 06/30/19: SAQ, STS and walking program. 8/26- step ups, eccentric stand to sit, walking program; 07/27/19 - SLS with 3 way vector, lat step ups    Consulted and Agree with Plan of Care  Patient       Patient will benefit from skilled therapeutic intervention in order to improve the following deficits and impairments:  Abnormal gait, Decreased coordination, Decreased range of motion, Difficulty walking, Increased fascial restricitons, Decreased safety awareness, Decreased activity tolerance, Decreased skin integrity, Pain, Decreased balance, Decreased knowledge of use of DME, Improper body mechanics, Decreased mobility, Decreased strength, Increased edema  Visit Diagnosis: Acute pain of right knee  Difficulty in walking, not elsewhere classified  Localized edema  Muscle weakness (generalized)     Problem List Patient Active Problem List   Diagnosis Date Noted  . Status post total right knee replacement 05/30/2019  . Unilateral primary osteoarthritis, left knee 04/18/2019  . Unilateral primary osteoarthritis, right knee 04/18/2019  . CVA  (cerebral vascular accident) [place] 08/04/2018  . Stroke ([facility] 08/04/2018  . Chronic back pain 08/04/2018  . Post-operative pain 09/20/2017  . 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 Lumbar stenosis with neurogenic claudication 02/04/2015  . Stiffness of joints, not elsewhere classified, multiple sites 10/27/2012  . Weakness of both legs 10/27/2012  . Difficulty in walking(719.7) 10/27/2012    RKipp Brood PT, DPT, W[facility] County[facility] Therapist with [facility] 07/27/2019 11:09 AM    [address] [place] [address]Phone: [phone]  Fax:  [phone] Name: Dawn HERINGERMRN:  [id number] Date of Birth: [birth year]

## 2019-07-28 ENCOUNTER — Ambulatory Visit (HOSPITAL_COMMUNITY): Payer: Medicare Other

## 2019-08-02 ENCOUNTER — Ambulatory Visit
Admission: RE | Admit: 2019-08-02 | Discharge: 2019-08-02 | Disposition: A | Discharge status: Home / Self Care | Payer: Medicare Other | Source: Ambulatory Visit | Attending: Internal Medicine | Admitting: Internal Medicine

## 2019-08-02 ENCOUNTER — Other Ambulatory Visit: Payer: Self-pay

## 2019-08-02 DIAGNOSIS — I10 Essential (primary) hypertension: Secondary | ICD-10-CM | POA: Diagnosis not present

## 2019-08-02 DIAGNOSIS — M48062 Spinal stenosis, lumbar region with neurogenic claudication: Secondary | ICD-10-CM | POA: Diagnosis not present

## 2019-08-02 DIAGNOSIS — E782 Mixed hyperlipidemia: Secondary | ICD-10-CM | POA: Diagnosis not present

## 2019-08-02 DIAGNOSIS — Z1231 Encounter for screening mammogram for malignant neoplasm of breast: Secondary | ICD-10-CM | POA: Diagnosis not present

## 2019-08-02 DIAGNOSIS — R7301 Impaired fasting glucose: Secondary | ICD-10-CM | POA: Diagnosis not present

## 2019-08-02 DIAGNOSIS — D509 Iron deficiency anemia, unspecified: Secondary | ICD-10-CM | POA: Diagnosis not present

## 2019-09-01 DIAGNOSIS — R7301 Impaired fasting glucose: Secondary | ICD-10-CM | POA: Diagnosis not present

## 2019-09-01 DIAGNOSIS — I10 Essential (primary) hypertension: Secondary | ICD-10-CM | POA: Diagnosis not present

## 2019-09-01 DIAGNOSIS — D509 Iron deficiency anemia, unspecified: Secondary | ICD-10-CM | POA: Diagnosis not present

## 2019-09-01 DIAGNOSIS — E782 Mixed hyperlipidemia: Secondary | ICD-10-CM | POA: Diagnosis not present

## 2019-09-18 ENCOUNTER — Other Ambulatory Visit: Payer: Self-pay

## 2019-09-18 ENCOUNTER — Ambulatory Visit: Payer: Self-pay

## 2019-09-18 ENCOUNTER — Ambulatory Visit (INDEPENDENT_AMBULATORY_CARE_PROVIDER_SITE_OTHER): Payer: Medicare Other | Admitting: Orthopaedic Surgery

## 2019-09-18 ENCOUNTER — Encounter: Payer: Self-pay | Admitting: Orthopaedic Surgery

## 2019-09-18 VITALS — Ht 64.0 in | Wt 205.0 lb

## 2019-09-18 DIAGNOSIS — M1712 Unilateral primary osteoarthritis, left knee: Secondary | ICD-10-CM | POA: Diagnosis not present

## 2019-09-18 DIAGNOSIS — M25562 Pain in left knee: Secondary | ICD-10-CM

## 2019-09-18 MED ORDER — LIDOCAINE HCL 1 % IJ SOLN
3.0000 mL | INTRAMUSCULAR | Status: AC | PRN
  Administered 2019-09-18: 10:00:00 3 mL

## 2019-09-18 MED ORDER — METHYLPREDNISOLONE ACETATE 40 MG/ML IJ SUSP
40.0000 mg | INTRAMUSCULAR | Status: AC | PRN
  Administered 2019-09-18: 40 mg via INTRA_ARTICULAR

## 2019-09-18 NOTE — Progress Notes (Signed)
Office Visit Note   Patient: [name]           Date of Birth: 09-26-1946           MRN: [id number] Visit Date: 09/18/2019              Requested by: Celene Squibb, MD [address] [place],  [place] [address] PCP: Celene Squibb, MD   Assessment & Plan: Visit Diagnoses:  1. Acute pain of left knee   2. Unilateral primary osteoarthritis, left knee     Plan: I did place a steroid injection in her left knee today.  She is interested in proceeding with knee replacement surgery.  I think this is reasonable as well given her clinical exam and x-ray findings.  She has had successful right total knee arthroplasty and I do feel the left knee needs replacing at this point given her clinical exam findings and radiographic findings.  She understands fully the risk and benefits of the surgery having had it done before.  All question concerns were answered addressed.  We will work on getting this scheduled for next month.  I did place a steroid injection in her left knee today per her request.  Follow-Up Instructions: Return for 2 weeks post-op.   Orders:  Orders Placed This Encounter  Procedures  . Large Joint Inj  . XR Knee 1-2 Views Left   No orders of the defined types were placed in this encounter.     Procedures: Large Joint Inj: L knee on 09/18/2019 10:14 AM Indications: diagnostic evaluation and pain Details: 22 G 1.5 in needle, superolateral approach  Arthrogram: No  Medications: 3 mL lidocaine 1 %; 40 mg methylPREDNISolone acetate 40 MG/ML Outcome: tolerated well, no immediate complications Procedure, treatment alternatives, risks and benefits explained, specific risks discussed. Consent was given by the patient. Immediately prior to procedure a time out was called to verify the correct patient, procedure, equipment, support staff and site/side marked as required. Patient was prepped and draped in the usual sterile fashion.       Clinical Data: No additional  findings.   Subjective: Chief Complaint  Patient presents with  . Left Knee - Pain  The patient is a very active 73 year old female who comes in today with severe left knee pain and locking in that knee.  She has known and well-documented end-stage arthritis of the left knee.  We actually performed a right total knee arthroplasty on her in July of this year.  She does feel that she is gone to the point that she is ready to proceed with a left total knee arthroplasty given the severity of arthritis.  She would like to consider steroid injection in her left knee to temporize her pain prior to surgery which she would like to have in over a month from now sometime in December so I do feel it is reasonable to try a steroid injection today.  She said the knee is just giving out on her.  She has no complaints with her right total knee arthroplasty.  HPI  Review of Systems She currently denies any headache, chest pain, shortness of breath, fever, chills, nausea, vomiting  Objective: Vital Signs: LMP  (LMP Unknown)   Physical Exam She is alert and orient x3 and in no acute distress Ortho Exam Examination of her right total knee shows he has fluid and full range of motion with no difficulty at all.  Examination of her left knee  shows varus malalignment.  There is no effusion but there is significant pain when I attempt to flex and extend the knee.  Her extensor mechanism is intact. Specialty Comments:  No specialty comments available.  Imaging: Xr Knee 1-2 Views Left  Result Date: 09/18/2019 2 views of the left knee show no acute findings in light of the patient's recent episode of locking with her knee.  There is severe end-stage arthritis.  There is varus malalignment.  There is complete loss of the medial joint space.  There are large osteophytes in all 3 compartments.  This is end-stage arthritis.    PMFS History: Patient Active Problem List   Diagnosis Date Noted  . Status post total  right knee replacement 05/30/2019  . Unilateral primary osteoarthritis, left knee 04/18/2019  . Unilateral primary osteoarthritis, right knee 04/18/2019  . CVA (cerebral vascular accident) ([place]) 08/04/2018  . Stroke ([place]) 08/04/2018  . Chronic back pain 08/04/2018  . Post-operative pain 09/20/2017  . 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 Lumbar stenosis with neurogenic claudication 02/04/2015  . Stiffness of joints, not elsewhere classified, multiple sites 10/27/2012  . Weakness of both legs 10/27/2012  . Difficulty in walking(719.7) 10/27/2012   Past Medical History:  Diagnosis Date  . Arthritis   . Cataracts, bilateral    surgery to remove cataracts - bilateral  . Chronic back pain   . Hypertension   . Stroke ([place])   . Wears dentures     Family History  Problem Relation Age of Onset  . Hypertension Mother   . Breast cancer Neg Hx   . Stroke Neg Hx     Past Surgical History:  Procedure Laterality Date  . ABDOMINAL HYSTERECTOMY  82  . APPENDECTOMY    . BACK SURGERY  2010  . BACK SURGERY  2015  . BACK SURGERY  06/2016  . CATARACT EXTRACTION W/PHACO Left 01/07/2017   Procedure: CATARACT EXTRACTION PHACO AND INTRAOCULAR LENS PLACEMENT ([facility]);  Surgeon: Tonny Branch, MD;  [facility];  Service: Ophthalmology;  Laterality: Left;  left CDE 9.31   . CATARACT EXTRACTION W/PHACO Right 01/25/2017   Procedure: CATARACT EXTRACTION PHACO AND INTRAOCULAR LENS PLACEMENT ([place] [facility]) CDE:16.07;  Surgeon: Tonny Branch, MD;  Location[facility];  Service: Ophthalmology;  Laterality: Right;  right  . FRACTURE SURGERY Right    arm  1974  . LUMBAR WOUND DEBRIDEMENT N/A 09/23/2017   Procedure: Exploration of Lumbar Wound; Repair of Pseudomeningocele;  Surgeon: Newman Pies, MD;  Location: [facility];  Service: Neurosurgery;  Laterality: N/A;  . TONSILLECTOMY    . TOTAL KNEE ARTHROPLASTY Right 05/30/2019   Procedure: RIGHT TOTAL KNEE ARTHROPLASTY;  Surgeon: Mcarthur Rossetti, MD;  Location:  [facility];  Service: Orthopedics;  Laterality: Right;   Social History   Occupational History  . Not on file  Tobacco Use  . Smoking status: Never Smoker  . Smokeless tobacco: Never Used  Substance and Sexual Activity  . Alcohol use: No  . Drug use: No  . Sexual activity: Not Currently    Birth control/protection: Post-menopausal

## 2019-09-22 DIAGNOSIS — E782 Mixed hyperlipidemia: Secondary | ICD-10-CM | POA: Diagnosis not present

## 2019-09-22 DIAGNOSIS — I10 Essential (primary) hypertension: Secondary | ICD-10-CM | POA: Diagnosis not present

## 2019-09-27 ENCOUNTER — Other Ambulatory Visit: Payer: Self-pay

## 2019-10-04 ENCOUNTER — Other Ambulatory Visit: Payer: Self-pay | Admitting: Physician Assistant

## 2019-10-06 NOTE — Progress Notes (Signed)
[facility] [place], [place] - [address] [place] [address] Phone: [phone] Fax: [place], [place] [address] Phone: [phone] Fax: [phone]  [facility] - [place], [place] - [address] [place] [address] Phone: [phone] Fax: [phone]      Your procedure is scheduled on 10-17-19  Report to [facility] Main Entrance "A" at 0740A.M., and check in at the Admitting office.  Call this number if you have problems the morning of surgery:  [phone]  Call [phone] if you have any questions prior to your surgery date Monday-Friday 8am-4pm    Remember:  Do not eat  after midnight the night before your surgery  You may drink clear liquids until 0630AM the morning of your surgery.   Clear liquids allowed are: Water, Non-Citrus Juices (without pulp), Carbonated Beverages, Clear Tea, Black Coffee Only, and Gatorade  Please complete your PRE-SURGERY ENSURE that was provided to you by 0630AM the morning of surgery.  Please, if able, drink it in one setting. DO NOT SIP.   Take these medicines the morning of surgery with A SIP OF WATER: atenolol (TENORMIN)  atorvastatin (LIPITOR) oxyCODONE-acetaminophen (PERCOCET/ROXICET)  7 days prior to surgery STOP taking any Aspirin (unless otherwise instructed by your surgeon), Aleve, Naproxen, Ibuprofen, Motrin, Advil, Goody's, BC's, all herbal medications, fish oil, and all vitamins.    The Morning of Surgery  Do not wear jewelry, make-up or nail polish.  Do not wear lotions, powders, or perfumes/colognes, or deodorant  Do not shave 48 hours prior to surgery.   Do not bring valuables to the hospital.  [facility] is not responsible for any belongings or valuables.  If you are a smoker, DO NOT Smoke 24 hours  prior to surgery  If you wear a CPAP at night please bring your mask, tubing, and machine the morning of surgery   Remember that you must have someone to transport you home after your surgery, and remain with you for 24 hours if you are discharged the same day.   Please bring cases for contacts, glasses, hearing aids, dentures or bridgework because it cannot be worn into surgery.    Leave your suitcase in the car.  After surgery it may be brought to your room.  For patients admitted to the hospital, discharge time will be determined by your treatment team.  Patients discharged the day of surgery will not be allowed to drive home.    Special instructions:   [place]- Preparing For Surgery  Before surgery, you can play an important role. Because skin is not sterile, your skin needs to be as free of germs as possible. You can reduce the number of germs on your skin by washing with CHG (chlorahexidine gluconate) Soap before surgery.  CHG is an antiseptic cleaner which kills germs and bonds with the skin to continue killing germs even after washing.    Oral Hygiene is also important to reduce your risk of infection.  Remember - BRUSH YOUR TEETH THE MORNING OF SURGERY WITH YOUR REGULAR TOOTHPASTE  Please do not use if you have an allergy to CHG or antibacterial soaps. If your skin becomes reddened/irritated stop using the CHG.  Do not shave (including legs and underarms) for at least 48 hours prior to first CHG shower. It is OK  to shave your face.  Please follow these instructions carefully.   1. Shower the NIGHT BEFORE SURGERY and the MORNING OF SURGERY with CHG Soap.   2. If you chose to wash your hair, wash your hair first as usual with your normal shampoo.  3. After you shampoo, rinse your hair and body thoroughly to remove the shampoo.  4. Use CHG as you would any other liquid soap. You can apply CHG directly to the skin and wash gently with a scrungie or a clean washcloth.    5. Apply the CHG Soap to your body ONLY FROM THE NECK DOWN.  Do not use on open wounds or open sores. Avoid contact with your eyes, ears, mouth and genitals (private parts). Wash Face and genitals (private parts)  with your normal soap.   6. Wash thoroughly, paying special attention to the area where your surgery will be performed.  7. Thoroughly rinse your body with warm water from the neck down.  8. DO NOT shower/wash with your normal soap after using and rinsing off the CHG Soap.  9. Pat yourself dry with a CLEAN TOWEL.  10. Wear CLEAN PAJAMAS to bed the night before surgery, wear comfortable clothes the morning of surgery  11. Place CLEAN SHEETS on your bed the night of your first shower and DO NOT SLEEP WITH PETS.    Day of Surgery:  Please shower the morning of surgery with the CHG soap Do not apply any deodorants/lotions. Please wear clean clothes to the hospital/surgery center.   Remember to brush your teeth WITH YOUR REGULAR TOOTHPASTE.   Please read over the  fact sheets that you were given.

## 2019-10-09 ENCOUNTER — Encounter (HOSPITAL_COMMUNITY)
Admission: RE | Admit: 2019-10-09 | Discharge: 2019-10-09 | Disposition: A | Discharge status: Home / Self Care | Payer: Medicare Other | Source: Ambulatory Visit | Attending: Orthopaedic Surgery | Admitting: Orthopaedic Surgery

## 2019-10-09 ENCOUNTER — Encounter (HOSPITAL_COMMUNITY): Payer: Self-pay

## 2019-10-09 ENCOUNTER — Other Ambulatory Visit: Payer: Self-pay

## 2019-10-09 DIAGNOSIS — Z7982 Long term (current) use of aspirin: Secondary | ICD-10-CM | POA: Diagnosis not present

## 2019-10-09 DIAGNOSIS — Z90710 Acquired absence of both cervix and uterus: Secondary | ICD-10-CM | POA: Insufficient documentation

## 2019-10-09 DIAGNOSIS — I10 Essential (primary) hypertension: Secondary | ICD-10-CM | POA: Insufficient documentation

## 2019-10-09 DIAGNOSIS — M1712 Unilateral primary osteoarthritis, left knee: Secondary | ICD-10-CM | POA: Insufficient documentation

## 2019-10-09 DIAGNOSIS — Z01812 Encounter for preprocedural laboratory examination: Secondary | ICD-10-CM | POA: Insufficient documentation

## 2019-10-09 DIAGNOSIS — Z8673 Personal history of transient ischemic attack (TIA), and cerebral infarction without residual deficits: Secondary | ICD-10-CM | POA: Diagnosis not present

## 2019-10-09 DIAGNOSIS — Z96651 Presence of right artificial knee joint: Secondary | ICD-10-CM | POA: Diagnosis not present

## 2019-10-09 DIAGNOSIS — G8929 Other chronic pain: Secondary | ICD-10-CM | POA: Insufficient documentation

## 2019-10-09 DIAGNOSIS — Z79899 Other long term (current) drug therapy: Secondary | ICD-10-CM | POA: Insufficient documentation

## 2019-10-09 DIAGNOSIS — M4327 Fusion of spine, lumbosacral region: Secondary | ICD-10-CM | POA: Diagnosis not present

## 2019-10-09 LAB — CBC
HCT: 36 % (ref 36.0–46.0)
Hemoglobin: 11 g/dL — ABNORMAL LOW (ref 12.0–15.0)
MCH: 26.6 pg (ref 26.0–34.0)
MCHC: 30.6 g/dL (ref 30.0–36.0)
MCV: 87.2 fL (ref 80.0–100.0)
Platelets: 212 10*3/uL (ref 150–400)
RBC: 4.13 MIL/uL (ref 3.87–5.11)
RDW: 15.1 % (ref 11.5–15.5)
WBC: 5.2 10*3/uL (ref 4.0–10.5)
nRBC: 0 % (ref 0.0–0.2)

## 2019-10-09 LAB — BASIC METABOLIC PANEL
Anion gap: 11 (ref 5–15)
BUN: 15 mg/dL (ref 8–23)
CO2: 22 mmol/L (ref 22–32)
Calcium: 9.1 mg/dL (ref 8.9–10.3)
Chloride: 104 mmol/L (ref 98–111)
Creatinine, Ser: 0.86 mg/dL (ref 0.44–1.00)
GFR calc Af Amer: >60 mL/min (ref >60)
GFR calc non Af Amer: >60 mL/min (ref >60)
Glucose, Bld: 114 mg/dL — ABNORMAL HIGH (ref 70–99)
Potassium: 4.4 mmol/L (ref 3.5–5.1)
Sodium: 137 mmol/L (ref 135–145)

## 2019-10-09 LAB — SURGICAL PCR SCREEN
MRSA, PCR: NEGATIVE
Staphylococcus aureus: POSITIVE — AB

## 2019-10-09 NOTE — Progress Notes (Signed)
PCP - Dr. Nevada Crane Cardiologist - denies    Chest x-ray - n/a EKG - 08-05-18  ECHO - 08-05-18 Cardiac Cath - 01-28-11  Aspirin Instructions: Instructed patient to stop taking 7 days prior to surgery  ERAS Protcol - yes, Ensure given  COVID TEST- Friday, Nov. 27th   Anesthesia review: yes, heart history  Patient denies shortness of breath, fever, cough and chest pain at PAT appointment   All instructions explained to the patient, with a verbal understanding of the material. Patient agrees to go over the instructions while at home for a better understanding. Patient also instructed to self quarantine after being tested for COVID-19. The opportunity to ask questions was provided.

## 2019-10-09 NOTE — Progress Notes (Signed)
PCR swab + for Staph  Called in prescription.   Patient called and notified.

## 2019-10-10 NOTE — Anesthesia Preprocedure Evaluation (Addendum)
Anesthesia Evaluation  Patient identified by MRN, date of birth, ID band Patient awake    Reviewed: Allergy & Precautions, NPO status , Patient's Chart, lab work & pertinent test results  Airway Mallampati: II  TM Distance: >3 FB Neck ROM: Full    Dental  (+) Edentulous Upper, Edentulous Lower   Pulmonary    breath sounds clear to auscultation       Cardiovascular hypertension,  Rhythm:Regular Rate:Normal     Neuro/Psych    GI/Hepatic   Endo/Other    Renal/GU      Musculoskeletal   Abdominal   Peds  Hematology   Anesthesia Other Findings   Reproductive/Obstetrics                           Anesthesia Physical Anesthesia Plan  ASA: III  Anesthesia Plan: General   Post-op Pain Management:  Regional for Post-op pain   Induction: Intravenous  PONV Risk Score and Plan: Ondansetron and Dexamethasone  Airway Management Planned: LMA  Additional Equipment:   Intra-op Plan:   Post-operative Plan:   Informed Consent: I have reviewed the patients History and Physical, chart, labs and discussed the procedure including the risks, benefits and alternatives for the proposed anesthesia with the patient or authorized representative who has indicated his/her understanding and acceptance.       Plan Discussed with: CRNA and Anesthesiologist  Anesthesia Plan Comments: (PAT note written by Myra Gianotti, PA-C. )       Anesthesia Quick Evaluation

## 2019-10-10 NOTE — Progress Notes (Signed)
Anesthesia Chart Review:  Case: [id number] Date/Time: 10/17/19 [id number]   Procedure: LEFT TOTAL KNEE ARTHROPLASTY (Left Knee)   Anesthesia type: Choice   Pre-op diagnosis: Osteoarthritis left knee   Location: [facility] ROOM 04 / [place] [facility]   Surgeon: Mcarthur Rossetti, MD      DISCUSSION: Patient is a 73 year old female scheduled for the above procedure. She is s/p right TKA 05/30/19 (general anesthesia, LMA, adductor canal block). (Multiple back surgeries, L2-S1 fusion; epidural hematoma 09/23/17.)   History includes never smoker, HTN, CVA (right arm and facial numbness, 08/05/18; seen by Phillips Odor, MD who felt MRI findings likely "remote and incidental" as they did not explain patient's symptoms), chronic back pain with multiple back surgeres (L4-5 microdiskectomy 11/15/08; L4-5 PLIF, L4-5/L5-S1 posterior lateral arthrodesis 02/04/15; L3-4 transforaminal interbody fusion, posterior segmental instrumentation from L3-S1 with globus titanium pedicle screws and rods, posterior lateral arthrodesis L3-4 07/09/16; L2-3 transforaminal interbody fusion, posterior lateral arthrodesis 09/13/17, evacuation of epidural hematoma 09/23/17), right TKA (05/30/19). BMI is consistent with obesity.  Last EKG seen is from 08/05/18, which is now > 21 year old. Based on history would meet anesthesia criteria for updated EKG (for tracing within past year). (I did fax a request to PCP Dr. Nevada Crane for most recent EKG.)  She tolerated right TKA in July. She denied SOB, cough, fever, chest pain at PAT RN visit. EKG is > 60 year old and wasn't repeated at PAT, so unless deferred by anesthesiologist (or more recent EKG received from Dr. Nevada Crane) plan for EKG on arrival. If stable findings and otherwise no acute changes then I would anticipate that he can proceed as planned. COVID-19 test is scheduled for 10/13/19 at [facility].     VS: BP 131/66   Pulse 62   Temp (!) 36.1 C (Oral)   Resp 16   Ht 5\' 4"  (1.626 m)   Wt 95.3 kg   LMP  (LMP  Unknown)   SpO2 96%   BMI 36.05 kg/m     PROVIDERS: Celene Squibb, MD is PCP    LABS: Labs reviewed: Acceptable for surgery. (all labs ordered are listed, but only abnormal results are displayed)  Labs Reviewed  SURGICAL PCR SCREEN - Abnormal; Notable for the following components:      Result Value   Staphylococcus aureus POSITIVE (*)    All other components within normal limits  BASIC METABOLIC PANEL - Abnormal; Notable for the following components:   Glucose, Bld 114 (*)    All other components within normal limits  CBC - Abnormal; Notable for the following components:   Hemoglobin 11.0 (*)    All other components within normal limits     IMAGES: CT L-spine 11/28/18: IMPRESSION: Status post L2-S1 fusion. The central canal and foramina appear open at all levels and there solid fusion at all levels. Note is made that the right L2 pedicle screw just traverses the right subarticular recess. The exam is otherwise unremarkable.  CTA head/neck 08/04/18: IMPRESSION: CTA NECK: 1. No hemodynamically significant stenosis ICA's. Patent vertebral arteries. 2. Severe neural foraminal narrowing C3-4 and C5-6. CTA HEAD: 1. No emergent large vessel occlusion or flow-limiting stenosis. 2. 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 Aortic Atherosclerosis (ICD10-I70.0).  MRI Brain 08/04/18: IMPRESSION: Acute punctate infarction in the right centrum semiovale radiating white matter tracts. No other acute finding. It is not clear how this would relate to the right-sided symptoms described in the history. Chronic small-vessel ischemic changes of a mild degree elsewhere affecting  the cerebral hemispheric white matter.   EKG: Currently last EKG seen is > 66 year old.   - EKG 08/04/18: Sinus rhythm Low voltage, precordial leads Abnormal R-wave progression, early transition Borderline T abnormalities, anterior leads since last tracing no significant change Confirmed by  Daleen Bo [phone]) on 08/04/2018 1:47:12 PM   CV: Echo 08/05/18: Study Conclusions - Left ventricle: The cavity size was normal. Wall thickness was increased in a pattern of mild LVH. Systolic function was normal. The estimated ejection fraction was in the range of 55% to 60%. Wall motion was normal; there were no regional wall motion abnormalities. Doppler parameters are consistent with abnormal left ventricular relaxation (grade 1 diastolic dysfunction). - Aortic valve: Mildly calcified annulus. Probably trileaflet; mildly calcified leaflets. - Mitral valve: There was trivial regurgitation. - Left atrium: The atrium was mildly dilated. - Right atrium: Central venous pressure (est): 3 mm Hg. - Atrial septum: No defect or patent foramen ovale was identified. - Tricuspid valve: There was trivial regurgitation. - Pulmonary arteries: Systolic pressure could not be accurately estimated. - Pericardium, extracardiac: There was no pericardial effusion.  Cardiac cath 01/27/11: IMPRESSION: LVEF 60% with no wall motion abnormalities, No angiographic evidence of any significant obstructive coronary artery (30% ostial D1) that explains her chest pain for admission. I would likely thenconsider non-ACS/nonanginal chest pain as the source for chest pain. Consider GI evaluation.   Past Medical History:  Diagnosis Date  . Arthritis   . Cataracts, bilateral    surgery to remove cataracts - bilateral  . Chronic back pain   . Hypertension   . Stroke ([place])   . Wears dentures     Past Surgical History:  Procedure Laterality Date  . ABDOMINAL HYSTERECTOMY  82  . APPENDECTOMY    . BACK SURGERY  2010  . BACK SURGERY  2015  . BACK SURGERY  06/2016  . CATARACT EXTRACTION W/PHACO Left 01/07/2017   Procedure: CATARACT EXTRACTION PHACO AND INTRAOCULAR LENS PLACEMENT (IOC);  Surgeon: Tonny Branch, MD;  Location: [facility];  Service: Ophthalmology;  Laterality: Left;  left CDE 9.31    . CATARACT EXTRACTION W/PHACO Right 01/25/2017   Procedure: CATARACT EXTRACTION PHACO AND INTRAOCULAR LENS PLACEMENT ([place]) CDE:16.07;  Surgeon: Tonny Branch, MD;  Location: [facility];  Service: Ophthalmology;  Laterality: Right;  right  . FRACTURE SURGERY Right    arm  1974  . LUMBAR WOUND DEBRIDEMENT N/A 09/23/2017   Procedure: Exploration of Lumbar Wound; Repair of Pseudomeningocele;  Surgeon: Newman Pies, MD;  Location: [place];  Service: Neurosurgery;  Laterality: N/A;  . TONSILLECTOMY    . TOTAL KNEE ARTHROPLASTY Right 05/30/2019   Procedure: RIGHT TOTAL KNEE ARTHROPLASTY;  Surgeon: Mcarthur Rossetti, MD;  Location: [place];  Service: Orthopedics;  Laterality: Right;    MEDICATIONS: . aspirin EC 325 MG EC tablet  . aspirin EC 81 MG tablet  . atenolol (TENORMIN) 25 MG tablet  . atorvastatin (LIPITOR) 40 MG tablet  . docusate sodium (COLACE) 100 MG capsule  . losartan-hydrochlorothiazide (HYZAAR) 50-12.5 MG tablet  . oxyCODONE (OXY IR/ROXICODONE) 5 MG immediate release tablet  . oxyCODONE-acetaminophen (PERCOCET/ROXICET) 5-325 MG tablet  . tiZANidine (ZANAFLEX) 4 MG tablet   No current facility-administered medications for this encounter.     Myra Gianotti, PA-C Surgical Short Stay/Anesthesiology [facility] Phone [phone] [facility] Phone [phone] 10/10/2019 4:04 PM

## 2019-10-11 ENCOUNTER — Ambulatory Visit: Payer: Medicare Other | Admitting: Orthopaedic Surgery

## 2019-10-13 ENCOUNTER — Other Ambulatory Visit (HOSPITAL_COMMUNITY)
Admission: RE | Admit: 2019-10-13 | Discharge: 2019-10-13 | Disposition: A | Discharge status: Home / Self Care | Payer: Medicare Other | Source: Ambulatory Visit | Attending: Orthopaedic Surgery | Admitting: Orthopaedic Surgery

## 2019-10-13 DIAGNOSIS — Z01812 Encounter for preprocedural laboratory examination: Secondary | ICD-10-CM | POA: Insufficient documentation

## 2019-10-13 DIAGNOSIS — Z20828 Contact with and (suspected) exposure to other viral communicable diseases: Secondary | ICD-10-CM | POA: Insufficient documentation

## 2019-10-13 LAB — SARS CORONAVIRUS 2 (TAT 6-24 HRS): SARS Coronavirus 2: NEGATIVE

## 2019-10-16 ENCOUNTER — Telehealth: Payer: Self-pay | Admitting: *Deleted

## 2019-10-16 NOTE — Care Plan (Signed)
RNCM call to patient for a pre-op Ortho bundle call. Reviewed Ortho bundle program and patient given opportunity to ask questions related to her upcoming Left TKA scheduled with Dr. Ninfa Linden on 10/17/19. Her spouse is able to assist at home. Anticipate HHPT will be needed after short hospital stay. Referral made to [facility], as this is agency that patient requested. They provided home health PT after her Right knee replacement in July of this year. She has a FWW and states she will not need any other DME. Will continue to follow for CM needs.

## 2019-10-16 NOTE — Telephone Encounter (Signed)
Pre-op Ortho bundle call completed.

## 2019-10-17 ENCOUNTER — Other Ambulatory Visit: Payer: Self-pay | Admitting: *Deleted

## 2019-10-17 ENCOUNTER — Ambulatory Visit (HOSPITAL_COMMUNITY): Payer: Medicare Other | Admitting: Vascular Surgery

## 2019-10-17 ENCOUNTER — Observation Stay (HOSPITAL_COMMUNITY): Payer: Medicare Other

## 2019-10-17 ENCOUNTER — Other Ambulatory Visit: Payer: Self-pay

## 2019-10-17 ENCOUNTER — Encounter (HOSPITAL_COMMUNITY)
Admission: RE | Disposition: A | Discharge status: Home / Self Care | Payer: Self-pay | Source: Home / Self Care | Attending: Orthopaedic Surgery

## 2019-10-17 ENCOUNTER — Inpatient Hospital Stay (HOSPITAL_COMMUNITY)
Admission: RE | Admit: 2019-10-17 | Discharge: 2019-10-19 | DRG: 470 | Disposition: A | Discharge status: Home / Self Care | Payer: Medicare Other | Attending: Orthopaedic Surgery | Admitting: Orthopaedic Surgery

## 2019-10-17 ENCOUNTER — Encounter (HOSPITAL_COMMUNITY): Payer: Self-pay

## 2019-10-17 ENCOUNTER — Ambulatory Visit (HOSPITAL_COMMUNITY): Payer: Medicare Other | Admitting: Certified Registered Nurse Anesthetist

## 2019-10-17 DIAGNOSIS — I10 Essential (primary) hypertension: Secondary | ICD-10-CM | POA: Diagnosis present

## 2019-10-17 DIAGNOSIS — D62 Acute posthemorrhagic anemia: Secondary | ICD-10-CM | POA: Diagnosis not present

## 2019-10-17 DIAGNOSIS — Z471 Aftercare following joint replacement surgery: Secondary | ICD-10-CM | POA: Diagnosis not present

## 2019-10-17 DIAGNOSIS — M25462 Effusion, left knee: Secondary | ICD-10-CM | POA: Diagnosis not present

## 2019-10-17 DIAGNOSIS — I639 Cerebral infarction, unspecified: Secondary | ICD-10-CM | POA: Diagnosis not present

## 2019-10-17 DIAGNOSIS — M1712 Unilateral primary osteoarthritis, left knee: Secondary | ICD-10-CM | POA: Diagnosis not present

## 2019-10-17 DIAGNOSIS — Z96651 Presence of right artificial knee joint: Secondary | ICD-10-CM | POA: Diagnosis present

## 2019-10-17 DIAGNOSIS — M21162 Varus deformity, not elsewhere classified, left knee: Secondary | ICD-10-CM | POA: Diagnosis present

## 2019-10-17 DIAGNOSIS — Z885 Allergy status to narcotic agent status: Secondary | ICD-10-CM | POA: Diagnosis not present

## 2019-10-17 DIAGNOSIS — Z96652 Presence of left artificial knee joint: Secondary | ICD-10-CM | POA: Diagnosis not present

## 2019-10-17 DIAGNOSIS — Z8673 Personal history of transient ischemic attack (TIA), and cerebral infarction without residual deficits: Secondary | ICD-10-CM | POA: Diagnosis not present

## 2019-10-17 DIAGNOSIS — G8918 Other acute postprocedural pain: Secondary | ICD-10-CM | POA: Diagnosis not present

## 2019-10-17 DIAGNOSIS — Z8249 Family history of ischemic heart disease and other diseases of the circulatory system: Secondary | ICD-10-CM

## 2019-10-17 DIAGNOSIS — Z888 Allergy status to other drugs, medicaments and biological substances status: Secondary | ICD-10-CM | POA: Diagnosis not present

## 2019-10-17 DIAGNOSIS — Z90710 Acquired absence of both cervix and uterus: Secondary | ICD-10-CM

## 2019-10-17 HISTORY — PX: TOTAL KNEE ARTHROPLASTY: SHX125

## 2019-10-17 SURGERY — ARTHROPLASTY, KNEE, TOTAL
Anesthesia: General | Site: Knee | Laterality: Left

## 2019-10-17 MED ORDER — FENTANYL CITRATE (PF) 100 MCG/2ML IJ SOLN
INTRAMUSCULAR | Status: DC | PRN
  Administered 2019-10-17 (×5): 50 ug via INTRAVENOUS

## 2019-10-17 MED ORDER — ONDANSETRON HCL 4 MG/2ML IJ SOLN: 4.0000 mg | Freq: Once | INTRAMUSCULAR | Status: DC | PRN

## 2019-10-17 MED ORDER — HYDROMORPHONE HCL 1 MG/ML IJ SOLN
INTRAMUSCULAR | Status: AC
  Administered 2019-10-17: 13:00:00
  Filled 2019-10-17: qty 1

## 2019-10-17 MED ORDER — ATENOLOL 25 MG PO TABS
25.0000 mg | ORAL_TABLET | Freq: Every day | ORAL | Status: DC
  Administered 2019-10-17 – 2019-10-19 (×3): 25 mg via ORAL
  Filled 2019-10-17 (×3): qty 1

## 2019-10-17 MED ORDER — DIPHENHYDRAMINE HCL 12.5 MG/5ML PO ELIX: 12.5000 mg | ORAL_SOLUTION | ORAL | Status: DC | PRN

## 2019-10-17 MED ORDER — LIDOCAINE 2% (20 MG/ML) 5 ML SYRINGE
INTRAMUSCULAR | Status: DC | PRN
  Administered 2019-10-17: 40 mg via INTRAVENOUS

## 2019-10-17 MED ORDER — DOCUSATE SODIUM 100 MG PO CAPS
100.0000 mg | ORAL_CAPSULE | Freq: Two times a day (BID) | ORAL | Status: DC
  Administered 2019-10-17 – 2019-10-19 (×4): 100 mg via ORAL
  Filled 2019-10-17 (×4): qty 1

## 2019-10-17 MED ORDER — METHOCARBAMOL 500 MG PO TABS
ORAL_TABLET | ORAL | Status: AC
  Filled 2019-10-17: qty 1

## 2019-10-17 MED ORDER — KETOROLAC TROMETHAMINE 15 MG/ML IJ SOLN
7.5000 mg | Freq: Four times a day (QID) | INTRAMUSCULAR | Status: AC
  Administered 2019-10-17 – 2019-10-18 (×4): 7.5 mg via INTRAVENOUS
  Filled 2019-10-17 (×4): qty 1

## 2019-10-17 MED ORDER — OXYCODONE HCL 5 MG PO TABS
5.0000 mg | ORAL_TABLET | ORAL | Status: DC | PRN
  Administered 2019-10-17 – 2019-10-18 (×2): 5 mg via ORAL
  Administered 2019-10-18: 10 mg via ORAL
  Administered 2019-10-18 – 2019-10-19 (×3): 5 mg via ORAL
  Filled 2019-10-17: qty 2
  Filled 2019-10-17: qty 1
  Filled 2019-10-17: qty 2
  Filled 2019-10-17: qty 1
  Filled 2019-10-17: qty 2
  Filled 2019-10-17: qty 1

## 2019-10-17 MED ORDER — ONDANSETRON HCL 4 MG/2ML IJ SOLN: 4.0000 mg | Freq: Four times a day (QID) | INTRAMUSCULAR | Status: DC | PRN

## 2019-10-17 MED ORDER — ALUM & MAG HYDROXIDE-SIMETH 200-200-20 MG/5ML PO SUSP: 30.0000 mL | ORAL | Status: DC | PRN

## 2019-10-17 MED ORDER — ACETAMINOPHEN 325 MG PO TABS: 325.0000 mg | ORAL_TABLET | Freq: Four times a day (QID) | ORAL | Status: DC | PRN

## 2019-10-17 MED ORDER — POLYETHYLENE GLYCOL 3350 17 G PO PACK: 17.0000 g | PACK | Freq: Every day | ORAL | Status: DC | PRN

## 2019-10-17 MED ORDER — MIDAZOLAM HCL 2 MG/2ML IJ SOLN
INTRAMUSCULAR | Status: AC
  Administered 2019-10-17: 1 mg via INTRAVENOUS
  Filled 2019-10-17: qty 2

## 2019-10-17 MED ORDER — OXYCODONE HCL 5 MG PO TABS
ORAL_TABLET | ORAL | Status: AC
  Administered 2019-10-17: 5 mg
  Filled 2019-10-17: qty 1

## 2019-10-17 MED ORDER — PANTOPRAZOLE SODIUM 40 MG PO TBEC
40.0000 mg | DELAYED_RELEASE_TABLET | Freq: Every day | ORAL | Status: DC
  Administered 2019-10-17 – 2019-10-19 (×3): 40 mg via ORAL
  Filled 2019-10-17 (×3): qty 1

## 2019-10-17 MED ORDER — METOCLOPRAMIDE HCL 5 MG PO TABS: 5.0000 mg | ORAL_TABLET | Freq: Three times a day (TID) | ORAL | Status: DC | PRN

## 2019-10-17 MED ORDER — METHOCARBAMOL 1000 MG/10ML IJ SOLN
500.0000 mg | Freq: Four times a day (QID) | INTRAVENOUS | Status: DC | PRN
  Filled 2019-10-17: qty 5

## 2019-10-17 MED ORDER — LOSARTAN POTASSIUM-HCTZ 50-12.5 MG PO TABS: 0.5000 | ORAL_TABLET | Freq: Every day | ORAL | Status: DC

## 2019-10-17 MED ORDER — LOSARTAN POTASSIUM 50 MG PO TABS
25.0000 mg | ORAL_TABLET | Freq: Every day | ORAL | Status: DC
  Administered 2019-10-17 – 2019-10-19 (×3): 25 mg via ORAL
  Filled 2019-10-17 (×3): qty 1

## 2019-10-17 MED ORDER — METHOCARBAMOL 500 MG PO TABS
500.0000 mg | ORAL_TABLET | Freq: Four times a day (QID) | ORAL | Status: DC | PRN
  Administered 2019-10-17 – 2019-10-19 (×3): 500 mg via ORAL
  Filled 2019-10-17 (×3): qty 1

## 2019-10-17 MED ORDER — ATORVASTATIN CALCIUM 40 MG PO TABS
40.0000 mg | ORAL_TABLET | Freq: Every day | ORAL | Status: DC
  Administered 2019-10-17 – 2019-10-19 (×3): 40 mg via ORAL
  Filled 2019-10-17 (×3): qty 1

## 2019-10-17 MED ORDER — FENTANYL CITRATE (PF) 250 MCG/5ML IJ SOLN
INTRAMUSCULAR | Status: AC
  Filled 2019-10-17: qty 5

## 2019-10-17 MED ORDER — SODIUM CHLORIDE 0.9 % IR SOLN
Status: DC | PRN
  Administered 2019-10-17: 1000 mL

## 2019-10-17 MED ORDER — CEFAZOLIN SODIUM-DEXTROSE 2-4 GM/100ML-% IV SOLN
2.0000 g | INTRAVENOUS | Status: AC
  Administered 2019-10-17: 2 g via INTRAVENOUS
  Filled 2019-10-17: qty 100

## 2019-10-17 MED ORDER — SODIUM CHLORIDE 0.9 % IV SOLN
INTRAVENOUS | Status: DC
  Administered 2019-10-17 – 2019-10-18 (×2): via INTRAVENOUS

## 2019-10-17 MED ORDER — FENTANYL CITRATE (PF) 100 MCG/2ML IJ SOLN
INTRAMUSCULAR | Status: AC
  Administered 2019-10-17: 50 ug via INTRAVENOUS
  Filled 2019-10-17: qty 2

## 2019-10-17 MED ORDER — OXYCODONE HCL 5 MG PO TABS
10.0000 mg | ORAL_TABLET | ORAL | Status: DC | PRN
  Administered 2019-10-17: 15 mg via ORAL
  Filled 2019-10-17: qty 3

## 2019-10-17 MED ORDER — PHENOL 1.4 % MT LIQD: 1.0000 | OROMUCOSAL | Status: DC | PRN

## 2019-10-17 MED ORDER — FENTANYL CITRATE (PF) 100 MCG/2ML IJ SOLN
50.0000 ug | Freq: Once | INTRAMUSCULAR | Status: AC
  Administered 2019-10-17: 08:00:00 50 ug via INTRAVENOUS

## 2019-10-17 MED ORDER — TRANEXAMIC ACID-NACL 1000-0.7 MG/100ML-% IV SOLN
1000.0000 mg | INTRAVENOUS | Status: AC
  Administered 2019-10-17: 1000 mg via INTRAVENOUS
  Filled 2019-10-17: qty 100

## 2019-10-17 MED ORDER — 0.9 % SODIUM CHLORIDE (POUR BTL) OPTIME
TOPICAL | Status: DC | PRN
  Administered 2019-10-17: 1000 mL

## 2019-10-17 MED ORDER — ASPIRIN EC 325 MG PO TBEC
325.0000 mg | DELAYED_RELEASE_TABLET | Freq: Two times a day (BID) | ORAL | Status: DC
  Administered 2019-10-17 – 2019-10-19 (×4): 325 mg via ORAL
  Filled 2019-10-17 (×4): qty 1

## 2019-10-17 MED ORDER — PROPOFOL 10 MG/ML IV BOLUS
INTRAVENOUS | Status: AC
  Filled 2019-10-17: qty 20

## 2019-10-17 MED ORDER — FENTANYL CITRATE (PF) 100 MCG/2ML IJ SOLN
INTRAMUSCULAR | Status: AC
  Filled 2019-10-17: qty 2

## 2019-10-17 MED ORDER — MENTHOL 3 MG MT LOZG: 1.0000 | LOZENGE | OROMUCOSAL | Status: DC | PRN

## 2019-10-17 MED ORDER — FENTANYL CITRATE (PF) 100 MCG/2ML IJ SOLN
25.0000 ug | INTRAMUSCULAR | Status: DC | PRN
  Administered 2019-10-17 (×4): 25 ug via INTRAVENOUS

## 2019-10-17 MED ORDER — CEFAZOLIN SODIUM-DEXTROSE 1-4 GM/50ML-% IV SOLN
1.0000 g | Freq: Four times a day (QID) | INTRAVENOUS | Status: AC
  Administered 2019-10-17 (×2): 1 g via INTRAVENOUS
  Filled 2019-10-17 (×2): qty 50

## 2019-10-17 MED ORDER — HYDROMORPHONE HCL 1 MG/ML IJ SOLN
0.2500 mg | INTRAMUSCULAR | Status: DC | PRN
  Administered 2019-10-17 (×6): 0.25 mg via INTRAVENOUS

## 2019-10-17 MED ORDER — HYDROCHLOROTHIAZIDE 10 MG/ML ORAL SUSPENSION
6.2500 mg | Freq: Every day | ORAL | Status: DC
  Administered 2019-10-18 – 2019-10-19 (×2): 6.25 mg via ORAL
  Filled 2019-10-17 (×5): qty 1.25

## 2019-10-17 MED ORDER — DEXAMETHASONE SODIUM PHOSPHATE 10 MG/ML IJ SOLN
INTRAMUSCULAR | Status: DC | PRN
  Administered 2019-10-17: 10 mg via INTRAVENOUS

## 2019-10-17 MED ORDER — ONDANSETRON HCL 4 MG/2ML IJ SOLN
INTRAMUSCULAR | Status: DC | PRN
  Administered 2019-10-17: 4 mg via INTRAVENOUS

## 2019-10-17 MED ORDER — HYDROMORPHONE HCL 1 MG/ML IJ SOLN: 0.5000 mg | INTRAMUSCULAR | Status: DC | PRN

## 2019-10-17 MED ORDER — MIDAZOLAM HCL 2 MG/2ML IJ SOLN
1.0000 mg | Freq: Once | INTRAMUSCULAR | Status: AC
  Administered 2019-10-17: 08:00:00 1 mg via INTRAVENOUS

## 2019-10-17 MED ORDER — CHLORHEXIDINE GLUCONATE 4 % EX LIQD: 60.0000 mL | Freq: Once | CUTANEOUS | Status: DC

## 2019-10-17 MED ORDER — ONDANSETRON HCL 4 MG PO TABS: 4.0000 mg | ORAL_TABLET | Freq: Four times a day (QID) | ORAL | Status: DC | PRN

## 2019-10-17 MED ORDER — HYDROMORPHONE HCL 1 MG/ML IJ SOLN
INTRAMUSCULAR | Status: AC
  Filled 2019-10-17: qty 1

## 2019-10-17 MED ORDER — PROPOFOL 10 MG/ML IV BOLUS
INTRAVENOUS | Status: DC | PRN
  Administered 2019-10-17: 160 mg via INTRAVENOUS

## 2019-10-17 MED ORDER — POVIDONE-IODINE 10 % EX SWAB
2.0000 "application " | Freq: Once | CUTANEOUS | Status: AC
  Administered 2019-10-17: 2 via TOPICAL

## 2019-10-17 MED ORDER — METOCLOPRAMIDE HCL 5 MG/ML IJ SOLN: 5.0000 mg | Freq: Three times a day (TID) | INTRAMUSCULAR | Status: DC | PRN

## 2019-10-17 MED ORDER — LACTATED RINGERS IV SOLN
INTRAVENOUS | Status: DC
  Administered 2019-10-17 (×2): via INTRAVENOUS

## 2019-10-17 SURGICAL SUPPLY — 70 items
BANDAGE ESMARK 6X9 LF (GAUZE/BANDAGES/DRESSINGS) ×1 IMPLANT
BASEPLATE TIBIAL PRIMARY SZ4 (Plate) ×1 IMPLANT
BEARIN TIBIAL TRIATH (Orthopedic Implant) ×2 IMPLANT
BEARING TIBIAL TRIATH (Orthopedic Implant) IMPLANT
BLADE SAG 18X100X1.27 (BLADE) ×2 IMPLANT
BNDG CMPR 9X6 STRL LF SNTH (GAUZE/BANDAGES/DRESSINGS) ×1
BNDG ELASTIC 6X5.8 VLCR STR LF (GAUZE/BANDAGES/DRESSINGS) ×4 IMPLANT
BNDG ESMARK 6X9 LF (GAUZE/BANDAGES/DRESSINGS) ×2
BOWL SMART MIX CTS (DISPOSABLE) ×2 IMPLANT
BSPLAT TIB 4 CMNT PRM STRL KN (Plate) ×1 IMPLANT
CEMENT BONE SIMPLEX SPEEDSET (Cement) ×2 IMPLANT
COVER SURGICAL LIGHT HANDLE (MISCELLANEOUS) ×2 IMPLANT
COVER WAND RF STERILE (DRAPES) ×1 IMPLANT
CUFF TOURN SGL QUICK 34 (TOURNIQUET CUFF) ×4
CUFF TOURN SGL QUICK 42 (TOURNIQUET CUFF) IMPLANT
CUFF TRNQT CYL 34X4.125X (TOURNIQUET CUFF) ×1 IMPLANT
DRAPE EXTREMITY T 121X128X90 (DISPOSABLE) ×2 IMPLANT
DRAPE HALF SHEET 40X57 (DRAPES) ×2 IMPLANT
DRAPE U-SHAPE 47X51 STRL (DRAPES) ×2 IMPLANT
DRSG PAD ABDOMINAL 8X10 ST (GAUZE/BANDAGES/DRESSINGS) ×2 IMPLANT
DRSG XEROFORM 1X8 (GAUZE/BANDAGES/DRESSINGS) ×1 IMPLANT
DURAPREP 26ML APPLICATOR (WOUND CARE) ×2 IMPLANT
ELECT CAUTERY BLADE 6.4 (BLADE) ×2 IMPLANT
ELECT REM PT RETURN 9FT ADLT (ELECTROSURGICAL) ×2
ELECTRODE REM PT RTRN 9FT ADLT (ELECTROSURGICAL) ×1 IMPLANT
FACESHIELD WRAPAROUND (MASK) ×4 IMPLANT
FACESHIELD WRAPAROUND OR TEAM (MASK) ×2 IMPLANT
FEMORAL PEG DISTAL FIXATION (Orthopedic Implant) ×1 IMPLANT
FEMORAL POSTERIOR STAB SZ4 (Orthopedic Implant) ×1 IMPLANT
GAUZE SPONGE 4X4 12PLY STRL (GAUZE/BANDAGES/DRESSINGS) ×2 IMPLANT
GAUZE XEROFORM 1X8 LF (GAUZE/BANDAGES/DRESSINGS) ×1 IMPLANT
GLOVE BIOGEL PI IND STRL 8 (GLOVE) ×2 IMPLANT
GLOVE BIOGEL PI INDICATOR 8 (GLOVE) ×2
GLOVE ORTHO TXT STRL SZ7.5 (GLOVE) ×3 IMPLANT
GLOVE SURG ORTHO 8.0 STRL STRW (GLOVE) ×4 IMPLANT
GOWN STRL REUS W/ TWL LRG LVL3 (GOWN DISPOSABLE) IMPLANT
GOWN STRL REUS W/ TWL XL LVL3 (GOWN DISPOSABLE) ×2 IMPLANT
GOWN STRL REUS W/TWL LRG LVL3 (GOWN DISPOSABLE)
GOWN STRL REUS W/TWL XL LVL3 (GOWN DISPOSABLE) ×6
HANDPIECE INTERPULSE COAX TIP (DISPOSABLE) ×2
IMMOBILIZER KNEE 22 UNIV (SOFTGOODS) ×2 IMPLANT
KIT BASIN OR (CUSTOM PROCEDURE TRAY) ×2 IMPLANT
KIT TURNOVER KIT B (KITS) ×2 IMPLANT
MANIFOLD NEPTUNE II (INSTRUMENTS) ×2 IMPLANT
NDL 18GX1X1/2 (RX/OR ONLY) (NEEDLE) IMPLANT
NEEDLE 18GX1X1/2 (RX/OR ONLY) (NEEDLE) IMPLANT
NS IRRIG 1000ML POUR BTL (IV SOLUTION) ×2 IMPLANT
PACK TOTAL JOINT (CUSTOM PROCEDURE TRAY) ×2 IMPLANT
PAD ARMBOARD 7.5X6 YLW CONV (MISCELLANEOUS) ×2 IMPLANT
PADDING CAST COTTON 6X4 STRL (CAST SUPPLIES) ×2 IMPLANT
PATELLA TRIATHLON SZ 29 9 MM (Orthopedic Implant) ×1 IMPLANT
PIN FLUTED HEDLESS FIX 3.5X1/8 (PIN) ×1 IMPLANT
SET HNDPC FAN SPRY TIP SCT (DISPOSABLE) ×1 IMPLANT
SET PAD KNEE POSITIONER (MISCELLANEOUS) ×2 IMPLANT
STAPLER VISISTAT 35W (STAPLE) ×1 IMPLANT
STRIP CLOSURE SKIN 1/2X4 (GAUZE/BANDAGES/DRESSINGS) ×1 IMPLANT
SUCTION FRAZIER HANDLE 10FR (MISCELLANEOUS) ×1
SUCTION TUBE FRAZIER 10FR DISP (MISCELLANEOUS) ×1 IMPLANT
SUT MNCRL AB 4-0 PS2 18 (SUTURE) IMPLANT
SUT VIC AB 0 CT1 27 (SUTURE) ×6
SUT VIC AB 0 CT1 27XBRD ANBCTR (SUTURE) ×1 IMPLANT
SUT VIC AB 1 CT1 27 (SUTURE) ×6
SUT VIC AB 1 CT1 27XBRD ANBCTR (SUTURE) ×2 IMPLANT
SUT VIC AB 2-0 CT1 27 (SUTURE) ×6
SUT VIC AB 2-0 CT1 TAPERPNT 27 (SUTURE) ×2 IMPLANT
SYR 50ML LL SCALE MARK (SYRINGE) IMPLANT
TOWEL GREEN STERILE (TOWEL DISPOSABLE) ×2 IMPLANT
TOWEL GREEN STERILE FF (TOWEL DISPOSABLE) ×2 IMPLANT
TRAY CATH 16FR W/PLASTIC CATH (SET/KITS/TRAYS/PACK) IMPLANT
WRAP KNEE MAXI GEL POST OP (GAUZE/BANDAGES/DRESSINGS) ×2 IMPLANT

## 2019-10-17 NOTE — Anesthesia Procedure Notes (Signed)
Procedure Name: LMA Insertion Date/Time: 10/17/2019 9:33 AM Performed by: Lance Coon, CRNA Pre-anesthesia Checklist: Patient identified, Emergency Drugs available, Suction available, Patient being monitored and Timeout performed Patient Re-evaluated:Patient Re-evaluated prior to induction Oxygen Delivery Method: Circle system utilized Preoxygenation: Pre-oxygenation with 100% oxygen Induction Type: IV induction LMA: LMA inserted LMA Size: 4.0 Number of attempts: 1 Placement Confirmation: positive ETCO2 and breath sounds checked- equal and bilateral Tube secured with: Tape Dental Injury: Teeth and Oropharynx as per pre-operative assessment

## 2019-10-17 NOTE — Care Plan (Signed)
Ortho Bundle Case Management Note  Patient Details  Name: [name] MRN: [id number] Date of Birth: February 15, 1946  RNCM call to patient for a pre-op Ortho bundle call. Reviewed Ortho bundle program and patient given opportunity to ask questions related to her upcoming Left TKA scheduled with Dr. Ninfa Linden on 10/17/19. Her spouse is able to assist at home. Anticipate HHPT will be needed after short hospital stay. Referral made to [facility], as this is agency that patient requested. This agency provided home health PT after her Right knee replacement in July of this year. She has a FWW and states she will not need any other DME. Will continue to follow for CM needs.                         DME Arranged:  (Patient has FWW from previous knee replacement; declines 3in1) DME Agency:     HH Arranged:  PT [place]:  [place] (now [facility])  Additional Comments: Please contact me with any questions of if this plan should need to change.  Jamse Arn, RN, BSN, [employer]  [phone] 10/17/2019, 1:51 PM

## 2019-10-17 NOTE — Brief Op Note (Signed)
10/17/2019  10:54 AM  PATIENT:  [name]  73 y.o. female  PRE-OPERATIVE DIAGNOSIS:  Osteoarthritis left knee  POST-OPERATIVE DIAGNOSIS:  Osteoarthritis left knee  PROCEDURE:  Procedure(s): LEFT TOTAL KNEE ARTHROPLASTY (Left)  SURGEON:  Surgeon(s) and Role:    Mcarthur Rossetti, MD - Primary  PHYSICIAN ASSISTANT: Benita Stabile, PA-C  ANESTHESIA:   regional and general  COUNTS:  YES  TOURNIQUET:   Total Tourniquet Time Documented: Thigh (Left) - 51 minutes Total: Thigh (Left) - 51 minutes   DICTATION: .Other Dictation: Dictation Number [id number]  PLAN OF CARE: Admit to inpatient   PATIENT DISPOSITION:  PACU - hemodynamically stable.   Delay start of Pharmacological VTE agent (>24hrs) due to surgical blood loss or risk of bleeding: no

## 2019-10-17 NOTE — Transfer of Care (Signed)
Immediate Anesthesia Transfer of Care Note  Patient: [name]  Procedure(s) Performed: LEFT TOTAL KNEE ARTHROPLASTY (Left Knee)  Patient Location: PACU  Anesthesia Type:General  Level of Consciousness: awake, alert  and oriented  Airway & Oxygen Therapy: Patient connected to nasal cannula oxygen  Post-op Assessment: Post -op Vital signs reviewed and stable  Post vital signs: stable  Last Vitals:  Vitals Value Taken Time  BP    Temp    Pulse 83 10/17/19 1121  Resp 15 10/17/19 1121  SpO2 92 % 10/17/19 1121  Vitals shown include unvalidated device data.  Last Pain:  Vitals:   10/17/19 0715  PainSc: 3          Complications: No apparent anesthesia complications

## 2019-10-17 NOTE — Op Note (Signed)
NAME: [name], [name] MEDICAL RECORD [id number] ACCOUNT [id number] DATE OF BIRTH:[birth year] FACILITY: [facility] LOCATION: [facility] PHYSICIAN:Platon Arocho Kerry Fort, MD  OPERATIVE REPORT  DATE OF PROCEDURE:  10/17/2019  PREOPERATIVE DIAGNOSIS:  Primary arthritis and degenerative joint disease, left knee.  POSTOPERATIVE DIAGNOSIS:  Primary arthritis and degenerative joint disease, left knee.  PROCEDURE:  Left total knee arthroplasty.  IMPLANTS:  Stryker Triathlon cemented knee system with size 4 femur, size 4 tibial tray, 11 mm, fixed-bearing polyethylene insert, size 29 patellar button.  SURGEON:  Lind Guest. Ninfa Linden, MD  ASSISTANT:  Erskine Emery, PA-C  ANESTHESIA: 1.  Left lower extremity adductor canal block. 2.  General.  TOURNIQUET TIME:  Under 1 hour.  ANTIBIOTICS:  Two grams IV Ancef.  ESTIMATED BLOOD LOSS:  Less than 100 mL.  COMPLICATIONS:  None.  INDICATIONS:  The patient is a 73 year old female well known to me.  She has bilateral knee severe end-stage arthritis.  She underwent a successful right total knee arthroplasty in July of this year.  She now presents to have her left knee replaced.  Her  pain is daily on the left knee and has detrimentally affected her activities of daily living, her quality of life and her mobility.  Her x-rays show severe end-stage arthritis with varus malalignment.  She has done very well with her right total knee  arthroplasty and wished to proceed with this on the left side at this point.  She has tried and failed conservative treatment measures.  She understands with the knee replacement surgery, there is a risk of acute blood loss anemia, nerve or vessel  injury, fracture, infection, DVT, and implant failure.  She understands our goals are to decrease pain, improve mobility and overall improve quality of life.  DESCRIPTION OF PROCEDURE:  After informed consent was obtained and appropriate left knee was marked, she was brought to the  operating room and placed supine on the operating table.  General anesthesia was then obtained.  A nonsterile tourniquet was  placed around her upper left thigh.  Left thigh, knee, leg, ankle and foot were prepped and draped with DuraPrep and sterile drapes including a sterile stockinette.  Time-out was called to identify correct patient and correct left knee.  We then used an  Esmarch to wrap that leg and tourniquet was inflated to 300 mm of pressure.  We then made a midline incision over the patella and carried this proximally and distally.  We dissected down the knee joint and carried out a medial parapatellar arthrotomy  finding a moderate joint effusion and significant periarticular osteophytes throughout the knee with severe joint disease of the medial compartment of the knee with varus malalignment.  We removed the medial and lateral meniscus and the ACL, PCL as well  as osteophytes around the knee.  With the knee in a flexed position, we used the extramedullary cutting guide for making our tibia cut, making this for a neutral slope correction of varus and valgus and taking 9 mm off the high side.  We made this cut  without difficulty.  Attention was then turned to the femur.  We used an intramedullary guide for the femur, setting our distal femoral cut at 8 mm distal femoral cut for a left knee at 5 degrees externally rotated.  We made this cut without difficulty  and brought the knee back down to full extension and achieved full extension with a 9 mm extension block.  We then back to the femur and put our femoral sizing  guide based off the epicondylar axis we chose a size 4 femur based off of this.  We put a  4-in-1 cutting block for a size 4 femur and made our anterior and posterior cuts, followed by our chamfer cuts.  We then made our femoral box cut.  We then went back to the tibia and chose a size 4 tibial tray for coverage setting rotation off the tibial  tubercle and the femur.  We made a keel  punch off of this.  With the trial size 4 femur and the trial size 4 tibia, we trialed a 9 mm and then really liked the 11 mm insert.  We then made our patellar cut and drilled 3 holes for size 29 patellar button.   With all trial instrumentation of the knee I put the knee through cycles of range of motion.  We were pleased with stability and range of motion of the knee.  We then removed all trial instrumentation from the knee and irrigated the knee with normal  saline solution.  We dried the knee real well and mixed our cement.  We then cemented the real Stryker Triathlon tibial tray size 4 followed by the real size 4 femur.  We placed our 11 mm fixed bearing polyethylene insert and cemented our patellar  button.  Once the cement had hardened with the knee in extended position, we removed cement debris from the knee and let the tourniquet down.  We then irrigated the knee with normal saline solution using pulsatile lavage.  Hemostasis was obtained with  electrocautery.  We then closed the arthrotomy with interrupted #1 Vicryl suture followed by closing the deep tissue was 0 Vicryl, 2-0 Vicryl was used to close the subcutaneous tissue and interrupted staples were used to reapproximate the skin.  Xeroform  and well-padded sterile dressing was applied.  She was awakened, extubated, and taken to recovery room in stable condition.  All final counts were correct.  There were no complications noted.  Of note, Benita Stabile, PA-C, assisted in the entire case.  His  assistance was crucial for facilitating all aspects of this case.  TN/NUANCE  D:10/17/2019 T:10/17/2019 JOB:[id number]

## 2019-10-17 NOTE — Anesthesia Procedure Notes (Signed)
Anesthesia Regional Block: Adductor canal block   Pre-Anesthetic Checklist: ,, timeout performed, Correct Patient, Correct Site, Correct Laterality, Correct Procedure, Correct Position, site marked, Risks and benefits discussed, pre-op evaluation,  At surgeon's request and post-op pain management  Laterality: Left  Prep: Maximum Sterile Barrier Precautions used, chloraprep       Needles:  Injection technique: Single-shot  Needle Type: Echogenic Stimulator Needle     Needle Length: 9cm  Needle Gauge: 21     Additional Needles:   Procedures:,,,, ultrasound used (permanent image in chart),,,,  Narrative:  Start time: 10/17/2019 8:00 AM End time: 10/17/2019 8:10 AM Injection made incrementally with aspirations every 5 mL.  Performed by: Personally  Anesthesiologist: Roberts Gaudy, MD  Additional Notes: 20 cc 0.75% Bupivacaine injected easily

## 2019-10-17 NOTE — H&P (Signed)
TOTAL KNEE ADMISSION H&P  Patient is being admitted for left total knee arthroplasty.  Subjective:  Chief Complaint:left knee pain.  HPI: [name], 73 y.o. female, has a history of pain and functional disability in the left knee due to arthritis and has failed non-surgical conservative treatments for greater than 12 weeks to includeNSAID's and/or analgesics, corticosteriod injections, viscosupplementation injections, flexibility and strengthening excercises, supervised PT with diminished ADL's post treatment, use of assistive devices, weight reduction as appropriate and activity modification.  Onset of symptoms was gradual, starting 5 years ago with gradually worsening course since that time. The patient noted no past surgery on the left knee(s).  Patient currently rates pain in the left knee(s) at 10 out of 10 with activity. Patient has night pain, worsening of pain with activity and weight bearing, pain that interferes with activities of daily living, pain with passive range of motion, crepitus and joint swelling.  Patient has evidence of subchondral sclerosis, periarticular osteophytes and joint space narrowing by imaging studies. There is no active infection.  Patient Active Problem List   Diagnosis Date Noted  . Status post total right knee replacement 05/30/2019  . Unilateral primary osteoarthritis, left knee 04/18/2019  . Unilateral primary osteoarthritis, right knee 04/18/2019  . CVA (cerebral vascular accident) ([place]) 08/04/2018  . Stroke ([place]) 08/04/2018  . Chronic back pain 08/04/2018  . Post-operative pain 09/20/2017  . 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 Lumbar stenosis with neurogenic claudication 02/04/2015  . Stiffness of joints, not elsewhere classified, multiple sites 10/27/2012  . Weakness of both legs 10/27/2012  . Difficulty in walking(719.7) 10/27/2012   Past Medical History:  Diagnosis Date  . Arthritis   . Cataracts, bilateral    surgery to remove  cataracts - bilateral  . Chronic back pain   . Hypertension   . Stroke ([place])   . Wears dentures     Past Surgical History:  Procedure Laterality Date  . ABDOMINAL HYSTERECTOMY  82  . APPENDECTOMY    . BACK SURGERY  2010  . BACK SURGERY  2015  . BACK SURGERY  06/2016  . CATARACT EXTRACTION W/PHACO Left 01/07/2017   Procedure: CATARACT EXTRACTION PHACO AND INTRAOCULAR LENS PLACEMENT ([facility]);  Surgeon: Tonny Branch, MD;  Location: [facility];  Service: Ophthalmology;  Laterality: Left;  left CDE 9.31   . CATARACT EXTRACTION W/PHACO Right 01/25/2017   Procedure: CATARACT EXTRACTION PHACO AND INTRAOCULAR LENS PLACEMENT ([place]) CDE:16.07;  Surgeon: Tonny Branch, MD;  Location: [facility];  Service: Ophthalmology;  Laterality: Right;  right  . FRACTURE SURGERY Right    arm  1974  . LUMBAR WOUND DEBRIDEMENT N/A 09/23/2017   Procedure: Exploration of Lumbar Wound; Repair of Pseudomeningocele;  Surgeon: Newman Pies, MD;  Location: [facility];  Service: Neurosurgery;  Laterality: N/A;  . TONSILLECTOMY    . TOTAL KNEE ARTHROPLASTY Right 05/30/2019   Procedure: RIGHT TOTAL KNEE ARTHROPLASTY;  Surgeon: Mcarthur Rossetti, MD;  Location: [facility];  Service: Orthopedics;  Laterality: Right;    Current Facility-Administered Medications  Medication Dose Route Frequency Provider Last Rate Last Dose  . ceFAZolin (ANCEF) IVPB 2g/100 mL premix  2 g Intravenous On Call to OR Pete Pelt, PA-C      . chlorhexidine (HIBICLENS) 4 % liquid 4 application  60 mL Topical Once Erskine Emery W, PA-C      . lactated ringers infusion   Intravenous Continuous Nolon Nations, MD      . povidone-iodine 10 % swab 2 application  2  application Topical Once Pete Pelt, PA-C      . tranexamic acid (CYKLOKAPRON) IVPB 1,000 mg  1,000 mg Intravenous To OR Pete Pelt, PA-C       Allergies  Allergen Reactions  . Flexeril [Cyclobenzaprine] Nausea Only  . Hydrocodone Nausea Only  . Lyrica [Pregabalin] Palpitations  .  Neurontin [Gabapentin] Palpitations    Social History   Tobacco Use  . Smoking status: Never Smoker  . Smokeless tobacco: Never Used  Substance Use Topics  . Alcohol use: No    Family History  Problem Relation Age of Onset  . Hypertension Mother   . Breast cancer Neg Hx   . Stroke Neg Hx      Review of Systems  Musculoskeletal: Positive for joint pain.  All other systems reviewed and are negative.   Objective:  Physical Exam  Constitutional: She is oriented to person, place, and time. She appears well-developed and well-nourished.  HENT:  Head: Normocephalic and atraumatic.  Eyes: Pupils are equal, round, and reactive to light. EOM are normal.  Neck: Normal range of motion. Neck supple.  Cardiovascular: Normal rate and regular rhythm.  Respiratory: Effort normal and breath sounds normal.  GI: Soft. Bowel sounds are normal.  Musculoskeletal:     Left knee: She exhibits decreased range of motion, effusion, abnormal alignment, bony tenderness and abnormal meniscus. Tenderness found. Medial joint line and lateral joint line tenderness noted.  Neurological: She is alert and oriented to person, place, and time.  Skin: Skin is warm and dry.  Psychiatric: She has a normal mood and affect.    Vital signs in last 24 hours: Temp:  [98.4 F (36.9 C)] 98.4 F (36.9 C) (12/01 0655) Pulse Rate:  [102] 102 (12/01 0655) Resp:  [18] 18 (12/01 0655) BP: (153)/(67) 153/67 (12/01 0655) SpO2:  [97 %] 97 % (12/01 0655)  Labs:   Estimated body mass index is 36.05 kg/m as calculated from the following:   Height as of 10/09/19: 5\' 4"  (1.626 m).   Weight as of 10/09/19: 95.3 kg.   Imaging Review Plain radiographs demonstrate severe degenerative joint disease of the left knee(s). The overall alignment ismild varus. The bone quality appears to be good for age and reported activity level.      Assessment/Plan:  End stage arthritis, left knee   The patient history, physical  examination, clinical judgment of the provider and imaging studies are consistent with end stage degenerative joint disease of the left knee(s) and total knee arthroplasty is deemed medically necessary. The treatment options including medical management, injection therapy arthroscopy and arthroplasty were discussed at length. The risks and benefits of total knee arthroplasty were presented and reviewed. The risks due to aseptic loosening, infection, stiffness, patella tracking problems, thromboembolic complications and other imponderables were discussed. The patient acknowledged the explanation, agreed to proceed with the plan and consent was signed. Patient is being admitted for inpatient treatment for surgery, pain control, PT, OT, prophylactic antibiotics, VTE prophylaxis, progressive ambulation and ADL's and discharge planning. The patient is planning to be discharged home with home health services

## 2019-10-17 NOTE — Progress Notes (Signed)
Orthopedic Tech Progress Note Patient Details:  [name] [birth year] [id number] Patient could not make it to 60 degrees on the machine. So I put it on what was comfort which was 30 degrees, I told a RN because patient nurse was busy at that time. Patient said "once pain medicine kicks in maybe she could increase the degrees"  CPM Left Knee CPM Left Knee: On Left Knee Flexion (Degrees): 0 Left Knee Extension (Degrees): 30 Additional Comments: added ice  Post Interventions Patient Tolerated: Fair Instructions Provided: Care of device, Adjustment of device  [name] 10/17/2019, 11:59 AM

## 2019-10-17 NOTE — Anesthesia Postprocedure Evaluation (Signed)
Anesthesia Post Note  Patient: ANAHAT OMANA  Procedure(s) Performed: LEFT TOTAL KNEE ARTHROPLASTY (Left Knee)     Patient location during evaluation: PACU Anesthesia Type: General Level of consciousness: awake and alert Pain management: pain level controlled Vital Signs Assessment: post-procedure vital signs reviewed and stable Respiratory status: spontaneous breathing, nonlabored ventilation, respiratory function stable and patient connected to nasal cannula oxygen Cardiovascular status: blood pressure returned to baseline and stable Postop Assessment: no apparent nausea or vomiting Anesthetic complications: no    Last Vitals:  Vitals:   10/17/19 1537 10/17/19 1548  BP: (!) 170/88 (!) 158/86  Pulse: (!) 102   Resp: 19   Temp:    SpO2: 94%     Last Pain:  Vitals:   10/17/19 1537  PainSc: 6                  Wilferd Ritson COKER

## 2019-10-18 ENCOUNTER — Encounter (HOSPITAL_COMMUNITY): Payer: Self-pay | Admitting: General Practice

## 2019-10-18 DIAGNOSIS — M25462 Effusion, left knee: Secondary | ICD-10-CM | POA: Diagnosis not present

## 2019-10-18 DIAGNOSIS — Z96651 Presence of right artificial knee joint: Secondary | ICD-10-CM | POA: Diagnosis not present

## 2019-10-18 DIAGNOSIS — Z888 Allergy status to other drugs, medicaments and biological substances status: Secondary | ICD-10-CM | POA: Diagnosis not present

## 2019-10-18 DIAGNOSIS — D62 Acute posthemorrhagic anemia: Secondary | ICD-10-CM | POA: Diagnosis not present

## 2019-10-18 DIAGNOSIS — I10 Essential (primary) hypertension: Secondary | ICD-10-CM | POA: Diagnosis not present

## 2019-10-18 DIAGNOSIS — M21162 Varus deformity, not elsewhere classified, left knee: Secondary | ICD-10-CM | POA: Diagnosis not present

## 2019-10-18 DIAGNOSIS — M1712 Unilateral primary osteoarthritis, left knee: Secondary | ICD-10-CM | POA: Diagnosis not present

## 2019-10-18 DIAGNOSIS — Z8673 Personal history of transient ischemic attack (TIA), and cerebral infarction without residual deficits: Secondary | ICD-10-CM | POA: Diagnosis not present

## 2019-10-18 DIAGNOSIS — Z90710 Acquired absence of both cervix and uterus: Secondary | ICD-10-CM | POA: Diagnosis not present

## 2019-10-18 DIAGNOSIS — Z8249 Family history of ischemic heart disease and other diseases of the circulatory system: Secondary | ICD-10-CM | POA: Diagnosis not present

## 2019-10-18 DIAGNOSIS — Z885 Allergy status to narcotic agent status: Secondary | ICD-10-CM | POA: Diagnosis not present

## 2019-10-18 LAB — CBC
HCT: 28.6 % — ABNORMAL LOW (ref 36.0–46.0)
Hemoglobin: 9 g/dL — ABNORMAL LOW (ref 12.0–15.0)
MCH: 26.5 pg (ref 26.0–34.0)
MCHC: 31.5 g/dL (ref 30.0–36.0)
MCV: 84.4 fL (ref 80.0–100.0)
Platelets: 174 10*3/uL (ref 150–400)
RBC: 3.39 MIL/uL — ABNORMAL LOW (ref 3.87–5.11)
RDW: 15.2 % (ref 11.5–15.5)
WBC: 6.6 10*3/uL (ref 4.0–10.5)
nRBC: 0 % (ref 0.0–0.2)

## 2019-10-18 MED ORDER — ASPIRIN 325 MG PO TBEC: 325.0000 mg | DELAYED_RELEASE_TABLET | Freq: Two times a day (BID) | ORAL | 0 refills | Status: DC

## 2019-10-18 MED ORDER — OXYCODONE HCL 5 MG PO TABS: 5.0000 mg | ORAL_TABLET | ORAL | 0 refills | Status: DC | PRN

## 2019-10-18 MED ORDER — ONDANSETRON 4 MG PO TBDP: 4.0000 mg | ORAL_TABLET | Freq: Three times a day (TID) | ORAL | 0 refills | Status: DC | PRN

## 2019-10-18 MED ORDER — TIZANIDINE HCL 4 MG PO TABS: 4.0000 mg | ORAL_TABLET | Freq: Three times a day (TID) | ORAL | 0 refills | Status: DC | PRN

## 2019-10-18 NOTE — Progress Notes (Signed)
Patient ID: [name], female   DOB: [birth year], 73 y.o.   MRN: [id number] Due to pain limiting her mobility, she is not safe for discharge to home today.  Needs more therapy.  Given her hgb of 9 and her being a fall risk, it is better to wait until tomorrow for possible discharge.  Will change to an Inpatient Admit.

## 2019-10-18 NOTE — Progress Notes (Signed)
Subjective: 1 Day Post-Op Procedure(s) (LRB): LEFT TOTAL KNEE ARTHROPLASTY (Left) Patient reports pain as moderate.  Acute blood loss anemia from surgery, but tolerating well.  Vitals stable.  Objective: Vital signs in last 24 hours: Temp:  [98.2 F (36.8 C)-99.6 F (37.6 C)] 98.6 F (37 C) (12/02 0242) Pulse Rate:  [64-104] 67 (12/02 0242) Resp:  [13-20] 17 (12/02 0242) BP: (131-176)/(65-94) 145/75 (12/02 0242) SpO2:  [91 %-100 %] 100 % (12/02 0242)  Intake/Output from previous day: 12/01 0701 - 12/02 0700 In: 854.2 [P.O.:120; I.V.:734.2] Out: 350 [Urine:300; Blood:50] Intake/Output this shift: No intake/output data recorded.  Recent Labs    10/18/19 0515  HGB 9.0*   Recent Labs    10/18/19 0515  WBC 6.6  RBC 3.39*  HCT 28.6*  PLT 174   No results for input(s): NA, K, CL, CO2, BUN, CREATININE, GLUCOSE, CALCIUM in the last 72 hours. No results for input(s): LABPT, INR in the last 72 hours.  Sensation intact distally Intact pulses distally Dorsiflexion/Plantar flexion intact Incision: scant drainage No cellulitis present Compartment soft   Assessment/Plan: 1 Day Post-Op Procedure(s) (LRB): LEFT TOTAL KNEE ARTHROPLASTY (Left) Up with therapy Discharge home with home health possibly this afternoon depending on her progress with therapy.  Will check on her later today.    Patient's anticipated LOS is less than 2 midnights, meeting these requirements: - Younger than 95 - Lives within 1 hour of care - Has a competent adult at home to recover with post-op recover - NO history of  - Chronic pain requiring opiods  - Diabetes  - Coronary Artery Disease  - Heart failure  - Heart attack  - Stroke  - DVT/VTE  - Cardiac arrhythmia  - Respiratory Failure/COPD  - Renal failure  - Anemia  - Advanced Liver disease       Mcarthur Rossetti 10/18/2019, 7:35 AM

## 2019-10-18 NOTE — Progress Notes (Signed)
Physical Therapy Treatment Patient Details Name: [name] MRN: [id number] DOB: 09/22/1946 Today's Date: 10/18/2019    History of Present Illness Pt is a 73 y/o female s/p elective L TKA. PMH including but not limited to R TKA in 05/2019, HTN and CVA.    PT Comments    Pt making slow progress with mobility. She was able to ambulate to and from bathroom in her room. However, pt very disappointed that she was unable to perform well enough to d/c home today. Pt would greatly benefit from more therapy sessions tomorrow prior to d/c'ing home with husband's support. Pt would continue to benefit from skilled physical therapy services at this time while admitted and after d/c to address the below listed limitations in order to improve overall safety and independence with functional mobility.    Follow Up Recommendations  Home health PT;Supervision/Assistance - 24 hour     Equipment Recommendations  None recommended by PT    Recommendations for Other Services       Precautions / Restrictions Precautions Precautions: Fall;Knee Restrictions Weight Bearing Restrictions: Yes LLE Weight Bearing: Weight bearing as tolerated    Mobility  Bed Mobility Overal bed mobility: Needs Assistance Bed Mobility: Sit to Supine       Sit to supine: Min assist   General bed mobility comments: increased time and effort, min A to return bilateral LEs onto bed  Transfers Overall transfer level: Needs assistance Equipment used: Rolling walker (2 wheeled) Transfers: Sit to/from Stand Sit to Stand: Min guard         General transfer comment: x1 from EOB and x1 from toilet; good technique utilized  Ambulation/Gait Ambulation/Gait assistance: Counsellor (Feet): 20 Feet Assistive device: Rolling walker (2 wheeled) Gait Pattern/deviations: Step-to pattern Gait velocity: decreased   General Gait Details: pt with slow, antalgic gait; cueing needed to maintain proximity to  [employer]    Modified Rankin (Stroke Patients Only)       Balance Overall balance assessment: Needs assistance Sitting-balance support: Feet supported Sitting balance-Leahy Scale: Fair     Standing balance support: Single extremity supported;Bilateral upper extremity supported Standing balance-Leahy Scale: Poor                              Cognition Arousal/Alertness: Awake/alert Behavior During Therapy: WFL for tasks assessed/performed Overall Cognitive Status: Within Functional Limits for tasks assessed                                        Exercises Total Joint Exercises Ankle Circles/Pumps: AROM;Both;20 reps;Supine Heel Slides: AAROM;Left;Supine;Limitations Heel Slides Limitations: unable to really flex at knee; pt reporting that her knee had "locked up" and unable to tolerate more than 3 reps    General Comments        Pertinent Vitals/Pain Pain Assessment: Faces Faces Pain Scale: Hurts little more Pain Location: L knee Pain Descriptors / Indicators: Grimacing;Guarding Pain Intervention(s): Monitored during session;Repositioned    Home Living                      Prior Function            PT Goals (current goals can now be found in the care plan section) Acute  Rehab PT Goals PT Goal Formulation: With patient Time For Goal Achievement: 11/01/19 Potential to Achieve Goals: Good Progress towards PT goals: Progressing toward goals    Frequency    7X/week      PT Plan Current plan remains appropriate    Co-evaluation              AM-PAC PT "6 Clicks" Mobility   Outcome Measure  Help needed turning from your back to your side while in a flat bed without using bedrails?: A Little Help needed moving from lying on your back to sitting on the side of a flat bed without using bedrails?: A Little Help needed moving to and from a bed to a chair (including a  wheelchair)?: A Little Help needed standing up from a chair using your arms (e.g., wheelchair or bedside chair)?: A Little Help needed to walk in hospital room?: A Little Help needed climbing 3-5 steps with a railing? : A Lot 6 Click Score: 17    End of Session Equipment Utilized During Treatment: Gait belt Activity Tolerance: Patient limited by pain Patient left: in bed;with call bell/phone within reach Nurse Communication: Mobility status PT Visit Diagnosis: Other abnormalities of gait and mobility (R26.89);Pain Pain - Right/Left: Left Pain - part of body: Knee     Time: [id number] PT Time Calculation (min) (ACUTE ONLY): 23 min  Charges:  $Gait Training: 8-22 mins $Therapeutic Activity: 8-22 mins                     Anastasio Champion, DPT  Acute Rehabilitation Services Pager [phone] Office [place] 10/18/2019, 1:48 PM

## 2019-10-18 NOTE — Discharge Instructions (Signed)

## 2019-10-18 NOTE — Evaluation (Signed)
Physical Therapy Evaluation Patient Details Name: [name] MRN: [id number] DOB: [birth year] Today's Date: 10/18/2019   History of Present Illness  Pt is a 73 y/o female s/p elective L TKA. PMH including but not limited to R TKA in 05/2019, HTN and CVA.  Clinical Impression  Pt presented supine in bed with HOB elevated, awake and willing to participate in therapy session. Prior to admission, pt reported that she had been ambulating with use of a RW for the past few weeks secondary to worsening L knee pain. Pt lives with her spouse in a two level home with a couple of steps to enter. She will have husband's support upon d/c. At the time of evaluation, pt greatly limited with mobility secondary to L knee pain (awaiting pain meds). Pt required min A for bed mobility, mod A to rise into standing from sitting EOB and min guard to take a few pivotal steps towards recliner chair with RW. Will plan for further gait training at next session as appropriate. Pt really wants to d/c home today.  Pt would continue to benefit from skilled physical therapy services at this time while admitted and after d/c to address the below listed limitations in order to improve overall safety and independence with functional mobility.     Follow Up Recommendations Home health PT;Supervision/Assistance - 24 hour    Equipment Recommendations  None recommended by PT    Recommendations for Other Services       Precautions / Restrictions Precautions Precautions: Fall;Knee Restrictions Weight Bearing Restrictions: Yes LLE Weight Bearing: Weight bearing as tolerated      Mobility  Bed Mobility Overal bed mobility: Needs Assistance Bed Mobility: Supine to Sit     Supine to sit: Min assist     General bed mobility comments: min A for movement of L LE off of bed to achieve upright sitting at EOB towards pt's L side  Transfers Overall transfer level: Needs assistance Equipment used: Rolling walker (2  wheeled) Transfers: Sit to/from [facility] Sit to Stand: Mod assist Stand pivot transfers: Min guard       General transfer comment: increased time and effort, good technique utilized, assistance needed to power into standing from sitting EOB; min guard for pivotal movements to chair towards pt's L side  Ambulation/Gait Ambulation/Gait assistance: Min guard   Assistive device: Rolling walker (2 wheeled) Gait Pattern/deviations: Step-to pattern     General Gait Details: pt only able to take 2-3 steps forwards in an attempt at gait training; however, greatly limited secondary to pain (awaiting meds)  Stairs            Wheelchair Mobility    Modified Rankin (Stroke Patients Only)       Balance Overall balance assessment: Needs assistance Sitting-balance support: Feet supported Sitting balance-Leahy Scale: Fair     Standing balance support: Single extremity supported;Bilateral upper extremity supported Standing balance-Leahy Scale: Poor                               Pertinent Vitals/Pain Pain Assessment: 0-10 Pain Score: 7  Pain Location: L knee Pain Descriptors / Indicators: Grimacing;Guarding Pain Intervention(s): Monitored during session;Repositioned    Home Living Family/patient expects to be discharged to:: Private residence Living Arrangements: Spouse/significant other Available Help at Discharge: Family;Available 24 hours/day Type of Home: House Home Access: Stairs to enter Entrance Stairs-Rails: Right;Left;Can reach both Entrance Stairs-Number of Steps: 3 Home Layout: Able  to live on main level with bedroom/bathroom;Two level Home Equipment: Cane - single point;Walker - 4 wheels;Wheelchair - manual;Shower seat - built in;Adaptive equipment;Walker - 2 wheels      Prior Function Level of Independence: Independent with assistive device(s)         Comments: has been ambulating with RW for the past few weeks secondary to  worsening L knee pain     Hand Dominance        Extremity/Trunk Assessment   Upper Extremity Assessment Upper Extremity Assessment: Overall WFL for tasks assessed    Lower Extremity Assessment Lower Extremity Assessment: LLE deficits/detail LLE Deficits / Details: pt with decreased strength and ROM limitations secondary to post-op pain and weakness LLE: Unable to fully assess due to pain    Cervical / Trunk Assessment Cervical / Trunk Assessment: Other exceptions Cervical / Trunk Exceptions: hx of multiple back surgeries  Communication   Communication: No difficulties  Cognition Arousal/Alertness: Awake/alert Behavior During Therapy: WFL for tasks assessed/performed Overall Cognitive Status: Within Functional Limits for tasks assessed                                        General Comments      Exercises Total Joint Exercises Quad Sets: AROM;Strengthening;Left;10 reps;Seated Heel Slides: AAROM;Left;10 reps;Seated   Assessment/Plan    PT Assessment Patient needs continued PT services  PT Problem List Decreased strength;Decreased range of motion;Decreased activity tolerance;Decreased mobility;Decreased balance;Decreased coordination;Decreased knowledge of use of DME;Decreased safety awareness;Decreased knowledge of precautions;Pain       PT Treatment Interventions DME instruction;Gait training;Therapeutic exercise;Stair training;Functional mobility training;Therapeutic activities;Balance training;Neuromuscular re-education;Patient/family education    PT Goals (Current goals can be found in the Care Plan section)  Acute Rehab PT Goals Patient Stated Goal: "home today" PT Goal Formulation: With patient Time For Goal Achievement: 11/01/19 Potential to Achieve Goals: Good    Frequency 7X/week   Barriers to discharge        Co-evaluation               AM-PAC PT "6 Clicks" Mobility  Outcome Measure Help needed turning from your back to  your side while in a flat bed without using bedrails?: A Little Help needed moving from lying on your back to sitting on the side of a flat bed without using bedrails?: A Little Help needed moving to and from a bed to a chair (including a wheelchair)?: A Little Help needed standing up from a chair using your arms (e.g., wheelchair or bedside chair)?: A Little Help needed to walk in hospital room?: A Little Help needed climbing 3-5 steps with a railing? : A Lot 6 Click Score: 17    End of Session Equipment Utilized During Treatment: Gait belt Activity Tolerance: Patient limited by pain Patient left: in chair;with call bell/phone within reach Nurse Communication: Mobility status PT Visit Diagnosis: Other abnormalities of gait and mobility (R26.89);Pain Pain - Right/Left: Left Pain - part of body: Knee    Time: [id number] PT Time Calculation (min) (ACUTE ONLY): 19 min   Charges:   PT Evaluation $PT Eval Moderate Complexity: 1 Mod          .Eduard Clos, PT, DPT  Acute Rehabilitation Services Pager [phone] Office [place] 10/18/2019, 9:38 AM

## 2019-10-19 NOTE — Progress Notes (Signed)
Subjective: 2 Days Post-Op Procedure(s) (LRB): LEFT TOTAL KNEE ARTHROPLASTY (Left) Patient reports pain as moderate.  Wanting to go home after PT.   Objective: Vital signs in last 24 hours: Temp:  [98.6 F (37 C)-100 F (37.8 C)] 100 F (37.8 C) (12/03 0842) Pulse Rate:  [76-97] 96 (12/03 0842) Resp:  [16-18] 18 (12/03 0842) BP: (118-147)/(59-82) 147/77 (12/03 0842) SpO2:  [91 %-100 %] 97 % (12/03 0842)  Intake/Output from previous day: 12/02 0701 - 12/03 0700 In: 720 [P.O.:720] Out: 1700 [Urine:1700] Intake/Output this shift: No intake/output data recorded.  Recent Labs    10/18/19 0515  HGB 9.0*   Recent Labs    10/18/19 0515  WBC 6.6  RBC 3.39*  HCT 28.6*  PLT 174   No results for input(s): NA, K, CL, CO2, BUN, CREATININE, GLUCOSE, CALCIUM in the last 72 hours. No results for input(s): LABPT, INR in the last 72 hours.  Intact pulses distally Dorsiflexion/Plantar flexion intact Incision: scant drainage Compartment soft   Assessment/Plan: 2 Days Post-Op Procedure(s) (LRB): LEFT TOTAL KNEE ARTHROPLASTY (Left) Up with therapy Discharge home with home health     Erskine Emery 10/19/2019, 9:44 AM

## 2019-10-19 NOTE — Discharge Summary (Signed)
Patient ID: [name] MRN: [id number] DOB/AGE: [birth year] 73 y.o.  Admit date: 10/17/2019 Discharge date: 10/19/2019  Admission Diagnoses:  Principal Problem:   Unilateral primary osteoarthritis, left knee Active Problems:   Status post total left knee replacement   Discharge Diagnoses:  Same  Past Medical History:  Diagnosis Date  . Arthritis   . Cataracts, bilateral    surgery to remove cataracts - bilateral  . Chronic back pain   . Hypertension   . Stroke ([place])   . Wears dentures     Surgeries: Procedure(s): LEFT TOTAL KNEE ARTHROPLASTY on 10/17/2019   Consultants:   Discharged Condition: Improved  Hospital Course: [name] is an 73 y.o. female who was admitted 10/17/2019 for operative treatment ofUnilateral primary osteoarthritis, left knee. Patient has severe unremitting pain that affects sleep, daily activities, and work/hobbies. After pre-op clearance the patient was taken to the operating room on 10/17/2019 and underwent  Procedure(s): LEFT TOTAL KNEE ARTHROPLASTY.    Patient was given perioperative antibiotics:  Anti-infectives (From admission, onward)   Start     Dose/Rate Route Frequency Ordered Stop   10/17/19 1630  ceFAZolin (ANCEF) IVPB 1 g/50 mL premix     1 g 100 mL/hr over 30 Minutes Intravenous Every 6 hours 10/17/19 1617 10/17/19 2300   10/17/19 0715  ceFAZolin (ANCEF) IVPB 2g/100 mL premix     2 g 200 mL/hr over 30 Minutes Intravenous On call to O.R. 10/17/19 0701 10/17/19 [id number]       Patient was given sequential compression devices, early ambulation, and chemoprophylaxis to prevent DVT.  Patient benefited maximally from hospital stay and there were no complications.    Recent vital signs:  Patient Vitals for the past 24 hrs:  BP Temp Temp src Pulse Resp SpO2  10/19/19 0842 (!) 147/77 100 F (37.8 C) Oral 96 18 97 %  10/19/19 0240 140/69 99.6 F (37.6 C) Oral 91 17 91 %  10/18/19 1927 (!) 122/59 99 F (37.2 C) Oral 97 16 96 %   10/18/19 1403 118/82 98.6 F (37 C) Oral 76 16 100 %     Recent laboratory studies:  Recent Labs    10/18/19 0515  WBC 6.6  HGB 9.0*  HCT 28.6*  PLT 174     Discharge Medications:   Allergies as of 10/19/2019      Reactions   Flexeril [cyclobenzaprine] Nausea Only   Hydrocodone Nausea Only   Lyrica [pregabalin] Palpitations   Neurontin [gabapentin] Palpitations      Medication List    TAKE these medications   aspirin 325 MG EC tablet Take 1 tablet (325 mg total) by mouth 2 (two) times daily after a meal. What changed:   medication strength  how much to take  when to take this   atenolol 25 MG tablet Commonly known as: TENORMIN Take 25 mg by mouth daily.   atorvastatin 40 MG tablet Commonly known as: Lipitor Take 1 tablet (40 mg total) by mouth daily.   docusate sodium 100 MG capsule Commonly known as: COLACE Take 1 capsule (100 mg total) by mouth 2 (two) times daily. What changed: when to take this   losartan-hydrochlorothiazide 50-12.5 MG tablet Commonly known as: HYZAAR Take 0.5 tablets by mouth daily.   ondansetron 4 MG disintegrating tablet Commonly known as: Zofran ODT Take 1 tablet (4 mg total) by mouth every 8 (eight) hours as needed for nausea or vomiting.   oxyCODONE 5 MG immediate release tablet Commonly known as: Oxy  IR/ROXICODONE Take 1-2 tablets (5-10 mg total) by mouth every 4 (four) hours as needed for moderate pain (pain score 4-6).   oxyCODONE-acetaminophen 5-325 MG tablet Commonly known as: PERCOCET/ROXICET Take 1 tablet by mouth 3 (three) times daily as needed.   tiZANidine 4 MG tablet Commonly known as: ZANAFLEX Take 1 tablet (4 mg total) by mouth every 8 (eight) hours as needed.            Durable Medical Equipment  (From admission, onward)         Start     Ordered   10/17/19 1618  DME Walker rolling  Once    Question:  Patient needs a walker to treat with the following condition  Answer:  Status post total left  knee replacement   10/17/19 1617   10/17/19 1618  DME 3 n 1  Once     10/17/19 1617          Diagnostic Studies: Dg Knee Left Port  Result Date: 10/17/2019 CLINICAL DATA:  Left knee arthroplasty EXAM: PORTABLE LEFT KNEE - 1-2 VIEW COMPARISON:  09/18/2019 FINDINGS: Interval postsurgical changes from left total knee arthroplasty. Arthroplasty components are in their expected alignment without periprosthetic lucency or fracture. Expected postoperative changes within the anterior and intra-articular soft tissues. IMPRESSION: Satisfactory postoperative appearance status post left total knee arthroplasty. Electronically Signed   By: Davina Poke M.D.   On: 10/17/2019 11:32    Disposition: Discharge disposition: 01-Home or Self Care         Follow-up Information    Mcarthur Rossetti, MD. Go on 10/31/2019.   Specialty: Orthopedic Surgery Why: at 9:00 am for your 2 week post-op appointment with Dr. Clarita Leber information: [place] [address] [phone]        Home, [facility] Follow up.   Specialty: [place] Why: You will have a total of 5 home health therapy visits prior to your 2 week appointment with Dr. Ninfa Linden; Someone from the agency will be contacting you after discharge from the hospital to arrange your initial visit in your home. Contact information: [address] [place] [address] [place]. Go on 11/01/2019.   Why: Your appointment with [facility] in [place] is at 2:45 pm. Please arrive at 2:30 pm. Address: [address] [place], [place] [address] [phone]           Signed: Erskine Emery 10/19/2019, 9:46 AM

## 2019-10-19 NOTE — Progress Notes (Signed)
Physical Therapy Treatment Patient Details Name: [name] MRN: [id number] DOB: 01/03/1946 Today's Date: 10/19/2019    History of Present Illness Pt is a 73 y/o female s/p elective L TKA. PMH including but not limited to R TKA in 05/2019, HTN and CVA.    PT Comments    Pt making steady progress with functional mobility and tolerated ambulating a further distance this session than yesterday. She remains limited overall secondary to weakness and L knee pain. Will plan for stair training at next session as appropriate prior to d/c home. Pt would continue to benefit from skilled physical therapy services at this time while admitted and after d/c to address the below listed limitations in order to improve overall safety and independence with functional mobility.    Follow Up Recommendations  Home health PT;Supervision/Assistance - 24 hour     Equipment Recommendations  None recommended by PT    Recommendations for Other Services       Precautions / Restrictions Precautions Precautions: Fall;Knee Restrictions Weight Bearing Restrictions: Yes LLE Weight Bearing: Weight bearing as tolerated    Mobility  Bed Mobility Overal bed mobility: Needs Assistance Bed Mobility: Sit to Supine       Sit to supine: Min assist   General bed mobility comments: min A to return L LE onto bed  Transfers Overall transfer level: Needs assistance Equipment used: Rolling walker (2 wheeled) Transfers: Sit to/from Stand Sit to Stand: Min guard         General transfer comment: good technique utilized, min guard for safety with transition  Ambulation/Gait Ambulation/Gait assistance: Counsellor (Feet): 50 Feet Assistive device: Rolling walker (2 wheeled) Gait Pattern/deviations: Step-to pattern Gait velocity: decreased   General Gait Details: pt with slow, cautious and guarded gait but able to progress into hallway with min guard for safety; no overt LOB or need for physical  assistance   Stairs             Wheelchair Mobility    Modified Rankin (Stroke Patients Only)       Balance Overall balance assessment: Needs assistance Sitting-balance support: Feet supported Sitting balance-Leahy Scale: Fair     Standing balance support: Single extremity supported;Bilateral upper extremity supported Standing balance-Leahy Scale: Poor                              Cognition Arousal/Alertness: Awake/alert Behavior During Therapy: WFL for tasks assessed/performed Overall Cognitive Status: Within Functional Limits for tasks assessed                                        Exercises Total Joint Exercises Long Arc Quad: AAROM;Left;10 reps;Seated Knee Flexion: AAROM;Left;10 reps;Seated    General Comments        Pertinent Vitals/Pain Pain Assessment: Faces Faces Pain Scale: Hurts little more Pain Location: L knee Pain Descriptors / Indicators: Grimacing;Guarding Pain Intervention(s): Monitored during session;Repositioned    Home Living                      Prior Function            PT Goals (current goals can now be found in the care plan section) Acute Rehab PT Goals PT Goal Formulation: With patient Time For Goal Achievement: 11/01/19 Potential to Achieve Goals: Good Progress towards PT goals:  Progressing toward goals    Frequency    7X/week      PT Plan Current plan remains appropriate    Co-evaluation              AM-PAC PT "6 Clicks" Mobility   Outcome Measure  Help needed turning from your back to your side while in a flat bed without using bedrails?: A Little Help needed moving from lying on your back to sitting on the side of a flat bed without using bedrails?: A Little Help needed moving to and from a bed to a chair (including a wheelchair)?: A Little Help needed standing up from a chair using your arms (e.g., wheelchair or bedside chair)?: A Little Help needed to walk in  hospital room?: A Little Help needed climbing 3-5 steps with a railing? : A Lot 6 Click Score: 17    End of Session Equipment Utilized During Treatment: Gait belt Activity Tolerance: Patient limited by pain Patient left: in bed;with call bell/phone within reach Nurse Communication: Mobility status PT Visit Diagnosis: Other abnormalities of gait and mobility (R26.89);Pain Pain - Right/Left: Left Pain - part of body: Knee     Time: [id number] PT Time Calculation (min) (ACUTE ONLY): 17 min  Charges:  $Gait Training: 8-22 mins                     Anastasio Champion, DPT  Acute Rehabilitation Services Pager [phone] Office [place] 10/19/2019, 12:34 PM

## 2019-10-19 NOTE — Progress Notes (Signed)
Physical Therapy Progress Note  Clinical Impression: Pt making steady progress with functional mobility. She participated in stair training this session without difficulties. Plan is for pt to d/c home today with family support. Pt is ready to d/c home from PT perspective. Pt would continue to benefit from skilled physical therapy services at this time while admitted and after d/c to address the below listed limitations in order to improve overall safety and independence with functional mobility.    10/19/19 1400  PT Visit Information  Last PT Received On 10/19/19  Assistance Needed +1  History of Present Illness Pt is a 73 y/o female s/p elective L TKA. PMH including but not limited to R TKA in 05/2019, HTN and CVA.  Precautions  Precautions Fall;Knee  Restrictions  Weight Bearing Restrictions Yes  LLE Weight Bearing WBAT  Pain Assessment  Pain Assessment Faces  Faces Pain Scale 2  Pain Location L knee  Pain Descriptors / Indicators Sore  Pain Intervention(s) Monitored during session;Repositioned  Cognition  Arousal/Alertness Awake/alert  Behavior During Therapy WFL for tasks assessed/performed  Overall Cognitive Status Within Functional Limits for tasks assessed  Bed Mobility  Overal bed mobility Needs Assistance  Bed Mobility Supine to Sit  Supine to sit Min assist  General bed mobility comments min A for L LE movement off of bed and for trunk elevation  Transfers  Overall transfer level Needs assistance  Equipment used Rolling walker (2 wheeled)  Transfers Sit to/from Stand  Sit to Stand Min guard  General transfer comment good technique utilized, min guard for safety with transition; performed x3 from EOB  Ambulation/Gait  Ambulation/Gait assistance Min guard  Gait Distance (Feet) 50 Feet  Assistive device Rolling walker (2 wheeled)  Gait Pattern/deviations Step-to pattern  General Gait Details pt with slow, cautious and guarded gait but able to progress into hallway with  min guard for safety; no overt LOB or need for physical assistance  Gait velocity decreased  Stairs Yes  Stairs assistance Min guard  Stair Management One rail Right;Step to pattern;Forwards  Number of Stairs 2  General stair comments cueing for sequencing, min guard for safety  Balance  Overall balance assessment Needs assistance  Sitting-balance support Feet supported  Sitting balance-Leahy Scale Fair  Standing balance support Single extremity supported;Bilateral upper extremity supported  Standing balance-Leahy Scale Poor  PT - End of Session  Equipment Utilized During Treatment Gait belt  Activity Tolerance Patient tolerated treatment well  Patient left in bed;with call bell/phone within reach  Nurse Communication Mobility status  CPM Left Knee  CPM Left Knee Off   PT - Assessment/Plan  PT Plan Current plan remains appropriate  PT Visit Diagnosis Other abnormalities of gait and mobility (R26.89);Pain  Pain - Right/Left Left  Pain - part of body Knee  PT Frequency (ACUTE ONLY) 7X/week  Follow Up Recommendations Home health PT;Supervision/Assistance - 24 hour  PT equipment None recommended by PT  AM-PAC PT "6 Clicks" Mobility Outcome Measure (Version 2)  Help needed turning from your back to your side while in a flat bed without using bedrails? 3  Help needed moving from lying on your back to sitting on the side of a flat bed without using bedrails? 3  Help needed moving to and from a bed to a chair (including a wheelchair)? 3  Help needed standing up from a chair using your arms (e.g., wheelchair or bedside chair)? 3  Help needed to walk in hospital room? 3  Help needed climbing 3-5  steps with a railing?  2  6 Click Score 17  Consider Recommendation of Discharge To: Home with [facility]  PT Goal Progression  Progress towards PT goals Progressing toward goals  Acute Rehab PT Goals  PT Goal Formulation With patient  Time For Goal Achievement 11/01/19  Potential to Achieve Goals  Good  PT Time Calculation  PT Start Time (ACUTE ONLY) 1309  PT Stop Time (ACUTE ONLY) 1327  PT Time Calculation (min) (ACUTE ONLY) 18 min  PT General Charges  $$ ACUTE PT VISIT 1 Visit  PT Treatments  $Gait Training 8-22 mins  Anastasio Champion, DPT  Acute Rehabilitation Services Pager [phone] Office [phone]

## 2019-10-20 ENCOUNTER — Telehealth: Payer: Self-pay | Admitting: *Deleted

## 2019-10-20 DIAGNOSIS — Z7982 Long term (current) use of aspirin: Secondary | ICD-10-CM | POA: Diagnosis not present

## 2019-10-20 DIAGNOSIS — M48062 Spinal stenosis, lumbar region with neurogenic claudication: Secondary | ICD-10-CM | POA: Diagnosis not present

## 2019-10-20 DIAGNOSIS — G8929 Other chronic pain: Secondary | ICD-10-CM | POA: Diagnosis not present

## 2019-10-20 DIAGNOSIS — Z96653 Presence of artificial knee joint, bilateral: Secondary | ICD-10-CM | POA: Diagnosis not present

## 2019-10-20 DIAGNOSIS — M2560 Stiffness of unspecified joint, not elsewhere classified: Secondary | ICD-10-CM | POA: Diagnosis not present

## 2019-10-20 DIAGNOSIS — I10 Essential (primary) hypertension: Secondary | ICD-10-CM | POA: Diagnosis not present

## 2019-10-20 DIAGNOSIS — Z9181 History of falling: Secondary | ICD-10-CM | POA: Diagnosis not present

## 2019-10-20 DIAGNOSIS — Z471 Aftercare following joint replacement surgery: Secondary | ICD-10-CM | POA: Diagnosis not present

## 2019-10-20 DIAGNOSIS — Z8673 Personal history of transient ischemic attack (TIA), and cerebral infarction without residual deficits: Secondary | ICD-10-CM | POA: Diagnosis not present

## 2019-10-20 NOTE — Telephone Encounter (Signed)
Ortho bundle D/C call completed. 

## 2019-10-20 NOTE — Telephone Encounter (Signed)
RNCM left message on clinic voice message updating that patient has had a knee replacement per Dr. Ninfa Linden on 10/17/19. Patient discharge from hospital on 10/19/19 and Dr. Ninfa Linden is prescribing Oxycodone for pain management at this time. Just wanted to let Dr. Nevada Crane be notified since he has prescribed pain medication for patient up until surgery.

## 2019-10-20 NOTE — Care Plan (Signed)
RNCM call to patient to check status after discharge from hospital yesterday, 10/19/19. Her husband answered phone and CM spoke with him while patient was finishing up with HHPT. He verbalized that they received no hospital discharge information to refer to. CM reviewed all post-op instructions with her husband. He verbalized also that she is having trouble with something making her nauseated. Husband feels it's pain medication, but patient states that she thinks it's the muscle relaxer. Reviewed all medications prescribed by Dr. Ninfa Linden at discharge. Reviewed that the Oxycodone prescribed by Dr. Ninfa Linden at discharge is 5mg  without acetaminphen as she is on chronic Oxycodone/APAP prescribed by her PCP. CM will reach out to that office to inform of surgery and that Dr. Ninfa Linden will be providing pain medication post-op for the first month. Reviewed all appointments scheduled (Post op with Dr. Ninfa Linden and OPPT).

## 2019-10-23 ENCOUNTER — Telehealth: Payer: Self-pay | Admitting: Orthopaedic Surgery

## 2019-10-23 DIAGNOSIS — Z7982 Long term (current) use of aspirin: Secondary | ICD-10-CM | POA: Diagnosis not present

## 2019-10-23 DIAGNOSIS — Z8673 Personal history of transient ischemic attack (TIA), and cerebral infarction without residual deficits: Secondary | ICD-10-CM | POA: Diagnosis not present

## 2019-10-23 DIAGNOSIS — Z96653 Presence of artificial knee joint, bilateral: Secondary | ICD-10-CM | POA: Diagnosis not present

## 2019-10-23 DIAGNOSIS — Z471 Aftercare following joint replacement surgery: Secondary | ICD-10-CM | POA: Diagnosis not present

## 2019-10-23 DIAGNOSIS — Z9181 History of falling: Secondary | ICD-10-CM | POA: Diagnosis not present

## 2019-10-23 DIAGNOSIS — G8929 Other chronic pain: Secondary | ICD-10-CM | POA: Diagnosis not present

## 2019-10-23 DIAGNOSIS — M2560 Stiffness of unspecified joint, not elsewhere classified: Secondary | ICD-10-CM | POA: Diagnosis not present

## 2019-10-23 DIAGNOSIS — M48062 Spinal stenosis, lumbar region with neurogenic claudication: Secondary | ICD-10-CM | POA: Diagnosis not present

## 2019-10-23 DIAGNOSIS — I10 Essential (primary) hypertension: Secondary | ICD-10-CM | POA: Diagnosis not present

## 2019-10-23 MED ORDER — OXYCODONE-ACETAMINOPHEN 5-325 MG PO TABS: 1.0000 | ORAL_TABLET | Freq: Four times a day (QID) | ORAL | 0 refills | Status: DC | PRN

## 2019-10-23 NOTE — Telephone Encounter (Signed)
Please advise 

## 2019-10-23 NOTE — Telephone Encounter (Signed)
Patient's husband called to request an RX refill on her Oxycodone.  He stated that the regular 5mg  is making her nauseous.  He is wanting to know if she can be prescribed the Oxycodone with Acetaminophen.  They would like a call back if she can get the other medication and when it is called in. CB#[phone].  Thank you.

## 2019-10-24 ENCOUNTER — Telehealth: Payer: Self-pay | Admitting: *Deleted

## 2019-10-24 NOTE — Care Plan (Signed)
Call to patient to check status at 1 week post-op. Patient states she is having a better day today than yesterday. Taking new Rx of Percocet in place of plain Oxycodone. She verbalized that therapy was going well. Reminded of 2 week in office appointment on 10/31/19. Reminded to call with any concerns or questions. Patient appreciative of phone call.

## 2019-10-24 NOTE — Telephone Encounter (Signed)
7 day Ortho bundle call completed. 

## 2019-10-25 ENCOUNTER — Other Ambulatory Visit: Payer: Self-pay | Admitting: *Deleted

## 2019-10-25 DIAGNOSIS — Z7982 Long term (current) use of aspirin: Secondary | ICD-10-CM | POA: Diagnosis not present

## 2019-10-25 DIAGNOSIS — G8929 Other chronic pain: Secondary | ICD-10-CM | POA: Diagnosis not present

## 2019-10-25 DIAGNOSIS — Z471 Aftercare following joint replacement surgery: Secondary | ICD-10-CM | POA: Diagnosis not present

## 2019-10-25 DIAGNOSIS — M48062 Spinal stenosis, lumbar region with neurogenic claudication: Secondary | ICD-10-CM | POA: Diagnosis not present

## 2019-10-25 DIAGNOSIS — I10 Essential (primary) hypertension: Secondary | ICD-10-CM | POA: Diagnosis not present

## 2019-10-25 DIAGNOSIS — Z8673 Personal history of transient ischemic attack (TIA), and cerebral infarction without residual deficits: Secondary | ICD-10-CM | POA: Diagnosis not present

## 2019-10-25 DIAGNOSIS — Z96653 Presence of artificial knee joint, bilateral: Secondary | ICD-10-CM | POA: Diagnosis not present

## 2019-10-25 DIAGNOSIS — M2560 Stiffness of unspecified joint, not elsewhere classified: Secondary | ICD-10-CM | POA: Diagnosis not present

## 2019-10-25 DIAGNOSIS — Z9181 History of falling: Secondary | ICD-10-CM | POA: Diagnosis not present

## 2019-10-25 NOTE — Patient Outreach (Signed)
[facility]) Care Management  10/25/2019  [name] Jan 12, 1946 [id number]    EMMI-GENERAL DISCHARGE-RESOLVED RED ON EMMI ALERT Day # 4 Date:10/24/2019 Red Alert Reason: No d/c paperwork  Outreach #1  RN spoke with pt today and purpose for today's call. RN further inquired on the above EMMI. Pt states she did not obtain discharge paperwork. RN inquired further on follow up appointments with her provider. Pt states has already spoke with the provider's office and obtained pain medication that has resolved her pain and discomfort. Also states she has a pending appointment with her orthopedic (Dr. Ninfa Linden) on 12/15. RN inquired on wound site and educated if swelling, redness or discoloration to contact her provider. Pt indicates she is aware and was education accordingly with the nurse from Dr. Pryor Montes office. RN reiterated on wound care and inquired on any other needs at this time. Pt report a recent shower and her spouse chnged the dressing as advised by provider's office. States wound site "looks good" with no signs of infection. Pt states all issues have been resolved with further needs.  Plan: Will close with no additional needs at this time.  Raina Mina, RN Care Management Coordinator [place] Office [phone]

## 2019-10-27 DIAGNOSIS — I10 Essential (primary) hypertension: Secondary | ICD-10-CM | POA: Diagnosis not present

## 2019-10-27 DIAGNOSIS — Z8673 Personal history of transient ischemic attack (TIA), and cerebral infarction without residual deficits: Secondary | ICD-10-CM | POA: Diagnosis not present

## 2019-10-27 DIAGNOSIS — G8929 Other chronic pain: Secondary | ICD-10-CM | POA: Diagnosis not present

## 2019-10-27 DIAGNOSIS — M2560 Stiffness of unspecified joint, not elsewhere classified: Secondary | ICD-10-CM | POA: Diagnosis not present

## 2019-10-27 DIAGNOSIS — Z7982 Long term (current) use of aspirin: Secondary | ICD-10-CM | POA: Diagnosis not present

## 2019-10-27 DIAGNOSIS — Z471 Aftercare following joint replacement surgery: Secondary | ICD-10-CM | POA: Diagnosis not present

## 2019-10-27 DIAGNOSIS — Z96653 Presence of artificial knee joint, bilateral: Secondary | ICD-10-CM | POA: Diagnosis not present

## 2019-10-27 DIAGNOSIS — Z9181 History of falling: Secondary | ICD-10-CM | POA: Diagnosis not present

## 2019-10-27 DIAGNOSIS — M48062 Spinal stenosis, lumbar region with neurogenic claudication: Secondary | ICD-10-CM | POA: Diagnosis not present

## 2019-10-30 ENCOUNTER — Telehealth: Payer: Self-pay | Admitting: *Deleted

## 2019-10-30 DIAGNOSIS — G8929 Other chronic pain: Secondary | ICD-10-CM | POA: Diagnosis not present

## 2019-10-30 DIAGNOSIS — Z7982 Long term (current) use of aspirin: Secondary | ICD-10-CM | POA: Diagnosis not present

## 2019-10-30 DIAGNOSIS — Z471 Aftercare following joint replacement surgery: Secondary | ICD-10-CM | POA: Diagnosis not present

## 2019-10-30 DIAGNOSIS — Z96653 Presence of artificial knee joint, bilateral: Secondary | ICD-10-CM | POA: Diagnosis not present

## 2019-10-30 DIAGNOSIS — M2560 Stiffness of unspecified joint, not elsewhere classified: Secondary | ICD-10-CM | POA: Diagnosis not present

## 2019-10-30 DIAGNOSIS — Z9181 History of falling: Secondary | ICD-10-CM | POA: Diagnosis not present

## 2019-10-30 DIAGNOSIS — Z8673 Personal history of transient ischemic attack (TIA), and cerebral infarction without residual deficits: Secondary | ICD-10-CM | POA: Diagnosis not present

## 2019-10-30 DIAGNOSIS — M48062 Spinal stenosis, lumbar region with neurogenic claudication: Secondary | ICD-10-CM | POA: Diagnosis not present

## 2019-10-30 DIAGNOSIS — I10 Essential (primary) hypertension: Secondary | ICD-10-CM | POA: Diagnosis not present

## 2019-10-30 MED ORDER — OXYCODONE-ACETAMINOPHEN 5-325 MG PO TABS: 1.0000 | ORAL_TABLET | Freq: Four times a day (QID) | ORAL | 0 refills | Status: DC | PRN

## 2019-10-30 NOTE — Telephone Encounter (Signed)
I sent some in to her pharmacy.

## 2019-10-30 NOTE — Telephone Encounter (Signed)
Patient/husband called in and requested refill of Percocet 5/325 mg. Has appointment tomorrow with you, but will be out by end of day today. [facility]. Thanks.

## 2019-10-31 ENCOUNTER — Telehealth: Payer: Self-pay | Admitting: *Deleted

## 2019-10-31 ENCOUNTER — Encounter: Payer: Self-pay | Admitting: Orthopaedic Surgery

## 2019-10-31 ENCOUNTER — Other Ambulatory Visit: Payer: Self-pay

## 2019-10-31 ENCOUNTER — Ambulatory Visit (INDEPENDENT_AMBULATORY_CARE_PROVIDER_SITE_OTHER): Payer: Medicare Other | Admitting: Orthopaedic Surgery

## 2019-10-31 DIAGNOSIS — Z96652 Presence of left artificial knee joint: Secondary | ICD-10-CM

## 2019-10-31 NOTE — Telephone Encounter (Signed)
14 day Ortho bundle call/in office visit completed.

## 2019-10-31 NOTE — Progress Notes (Signed)
The patient is status post left total knee arthroplasty done 2 weeks ago.  She also has a history of a right total knee arthroplasty.  Her left knee was the one done most recent.  She says she is doing very well.  She is scheduled to start outpatient physical therapy tomorrow.  On exam her calf is soft.  Her foot is not swollen on the left side.  Her incisions well-healed she remove the staples in place Steri-Strips.  Her extension is almost full and her flexion is to past 90 degrees.  Her knee feels ligamentously stable.  I told her I am very proud of where she stands in just 2 weeks.  She feels like she is doing well overall.  We did call in pain medication for her last night.  She understands that we can continue to call this and as she needs it.  All question concerns were answered and addressed.  We will see her back in 4 weeks for repeat exam and see how she is doing overall but no x-rays are needed.

## 2019-10-31 NOTE — Care Plan (Signed)
RNCM met with patient in office during her 2 week post-op appointment with Dr. Ninfa Linden. She is doing well with flexion and extension at 2 weeks S/P Left TKA. Some bruising noted, moderate swelling. Reminded from nursing standpoint to elevate and ice as needed and continue with ambulation/ankle pumps. MD removed staples today and applied steri-strips. She has completed HHPT and is scheduled to begin OPPT tomorrow, 11/01/19 with [facility] in [place]. Pain medication refilled yesterday per her request. F/U 4 weeks.

## 2019-11-01 ENCOUNTER — Ambulatory Visit (HOSPITAL_COMMUNITY): Payer: Medicare Other | Admitting: Physical Therapy

## 2019-11-02 ENCOUNTER — Ambulatory Visit (HOSPITAL_COMMUNITY): Payer: Medicare Other | Attending: Orthopaedic Surgery | Admitting: Physical Therapy

## 2019-11-02 ENCOUNTER — Other Ambulatory Visit: Payer: Self-pay

## 2019-11-02 DIAGNOSIS — R262 Difficulty in walking, not elsewhere classified: Secondary | ICD-10-CM | POA: Insufficient documentation

## 2019-11-02 DIAGNOSIS — M25562 Pain in left knee: Secondary | ICD-10-CM | POA: Diagnosis not present

## 2019-11-02 DIAGNOSIS — G8929 Other chronic pain: Secondary | ICD-10-CM | POA: Diagnosis not present

## 2019-11-02 NOTE — Therapy (Signed)
[place], [place], [address] Phone: [phone]   Fax:  [phone]  Physical Therapy Evaluation  Patient Details  Name: LAVONYA LANGER MRN: [id number] Date of Birth: [birth year] Referring Provider (PT): Jean Rosenthal    Encounter Date: 11/02/2019  PT End of Session - 11/02/19 1319    Visit Number  1    Number of Visits  12    Date for PT Re-Evaluation  11/30/19    Authorization Type  [employer] Medicare    Authorization Time Period  previous 16 visits used this year. POC dates 11/02/19 -11/30/2019    Authorization - Visit Number  1    Authorization - Number of Visits  10    PT Start Time  [id number]    PT Stop Time  1355    PT Time Calculation (min)  34 min    Activity Tolerance  Patient tolerated treatment well    Behavior During Therapy  WFL for tasks assessed/performed       Past Medical History:  Diagnosis Date  . Arthritis   . Cataracts, bilateral    surgery to remove cataracts - bilateral  . Chronic back pain   . Hypertension   . Stroke ([place])   . Wears dentures     Past Surgical History:  Procedure Laterality Date  . ABDOMINAL HYSTERECTOMY  82  . APPENDECTOMY    . BACK SURGERY  2010  . BACK SURGERY  2015  . BACK SURGERY  06/2016  . CATARACT EXTRACTION W/PHACO Left 01/07/2017   Procedure: CATARACT EXTRACTION PHACO AND INTRAOCULAR LENS PLACEMENT (IOC);  Surgeon: Tonny Branch, MD;  Location: [facility];  Service: Ophthalmology;  Laterality: Left;  left CDE 9.31   . CATARACT EXTRACTION W/PHACO Right 01/25/2017   Procedure: CATARACT EXTRACTION PHACO AND INTRAOCULAR LENS PLACEMENT ([place]) CDE:16.07;  Surgeon: Tonny Branch, MD;  Location: [facility];  Service: Ophthalmology;  Laterality: Right;  right  . FRACTURE SURGERY Right    arm  1974  . LUMBAR WOUND DEBRIDEMENT N/A 09/23/2017   Procedure: Exploration of Lumbar Wound; Repair of Pseudomeningocele;  Surgeon: Newman Pies, MD;  Location: [facility];  Service:  Neurosurgery;  Laterality: N/A;  . TONSILLECTOMY    . TOTAL KNEE ARTHROPLASTY Right 05/30/2019   Procedure: RIGHT TOTAL KNEE ARTHROPLASTY;  Surgeon: Mcarthur Rossetti, MD;  Location: [facility];  Service: Orthopedics;  Laterality: Right;  . TOTAL KNEE ARTHROPLASTY Left 10/17/2019  . TOTAL KNEE ARTHROPLASTY Left 10/17/2019   Procedure: LEFT TOTAL KNEE ARTHROPLASTY;  Surgeon: Mcarthur Rossetti, MD;  Location: [facility];  Service: Orthopedics;  Laterality: Left;    There were no vitals filed for this visit.   Subjective Assessment - 11/02/19 1324    Subjective  Patient complains of left knee pain that started while after her right TKA. States her left knee would lock up and she couldn't bear weight on it or walk. States she had her right knee replaced earlier this year and elected to have her left knee replaced on 10/17/19. States she has been doing well. She has some pain but takes her medication regularly. States the MD was pleased when he saw her the other day. Reports that she really wants to be ready to go back to work on 11/20/19 and was hoping that she could be all done with therapy by then. Patient wants to be able to walk without an assistive device and be able to bend it with less pain.  How long can you stand comfortably?  a couple minutes    How long can you walk comfortably?  can walk room to room - a couple minutes    Currently in Pain?  Yes    Pain Score  5     Pain Location  Knee    Pain Orientation  Left    Pain Descriptors / Indicators  Aching    Pain Type  Surgical pain    Pain Onset  1 to 4 weeks ago    Aggravating Factors   moving, standing for more then a few minutes    Pain Relieving Factors  ice, meds, laying her knee on it's side         [facility] PT Assessment - 11/02/19 0001      Assessment   Medical Diagnosis  s/p R TKR     Referring Provider (PT)  Jean Rosenthal     Onset Date/Surgical Date  10/17/19    Next MD Visit  11/28/19    Prior Therapy  yes        Balance Screen   Has the patient fallen in the past 6 months  No    Has the patient had a decrease in activity level because of a fear of falling?   Yes    Is the patient reluctant to leave their home because of a fear of falling?   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 warmtha nd redness around incision site.    Focus on Therapeutic Outcomes (FOTO)   65% limited      AROM   Right/Left Knee  Right;Left    Right Knee Extension  2   lacking   Right Knee Flexion  120    Left Knee Extension  9   lacking   Left Knee Flexion  105      Strength   Overall Strength Comments  good quad activation - able to perform 5 ASLR with no extension lag from full available ROM       Ambulation/Gait   Ambulation/Gait  Yes    Ambulation/Gait Assistance  7: Independent    Ambulation Distance (Feet)  172 Feet    Assistive device  --   walking stick   Gait Pattern  Decreased step length - right;Decreased step length - left;Decreased stance time - right;Decreased hip/knee flexion - right;Decreased hip/knee flexion - left;Decreased dorsiflexion - right;Trunk flexed;Antalgic    Ambulation Surface  Level;Indoor    Gait Comments  2MW                Objective measurements completed on examination: See above findings.      [facility] PT Treatment/Exercise - 11/02/19 0001      Knee/Hip Exercises: Stretches   Active Hamstring Stretch  Left;3 reps;30 seconds   seated edge of bed   Other Knee/Hip Stretches  seated knee flexiona nd extension x10, 10" holds Each L      Knee/Hip Exercises:  Supine   Straight Leg Raises  AROM;Strengthening;Left;4 sets;5 reps             PT Education - 11/02/19 1401    Education Details  on signs and symptoms of DVT. review of HEP and new exercises for HEP. currnet presentation and rehab outcomes    Person(s) Educated  Patient    Methods  Explanation    Comprehension  Verbalized understanding       PT Short Term Goals - 11/02/19 1358      PT SHORT TERM GOAL #1   Title  Patient will be independent in HEP to improve functional outcomes    Time  2    Period  Weeks    Status  New    Target Date  11/16/19      PT SHORT TERM GOAL #2   Title  Patietn will demonstrate at least 2-115 degrees of left knee ROM to improve functional mobility.    Time  0    Period  Weeks    Status  New    Target Date  11/16/19        PT Long Term Goals - 11/02/19 1359      PT LONG TERM GOAL #1   Title  Patient will be able to ambulate at least 226 feet in 2 minutes to improve functional mobility (with least restrictive assistive device as needed).    Time  4    Period  Weeks    Status  New    Target Date  11/30/19      PT LONG TERM GOAL #2   Title  Patient will be able to transition from sit to stance without the use of upper extremities to improve transitional mobility.    Baseline  currently requires bilateral use of upper extremities for transition.    Time  4    Period  Weeks    Status  New    Target Date  11/30/19             Plan - 11/02/19 1402    Clinical Impression Statement  Patient presents to clinic s/p left TLKA on 10/17/19. She presents with excellent ROM being less then 2 weeks post-operative and is highly motivated to get back to prior level of function. Educated patient on current condition, signs of DVT and answered all questions about rehab procedure. Patient would greatly benefit from skilled physical therapy focusing on improving functional mobility and decreasing risk of falling at home and in the community.     Personal Factors and Comorbidities  Age;Fitness;Past/Current Experience;Comorbidity 1;Sex;Education;Social Background;Time since onset of injury/illness/exacerbation    Comorbidities  HTN, 5 back surgeries    Examination-Activity Limitations  Bathing;Locomotion Level;Transfers;Bed Mobility;Sit;Sleep;Squat;Stairs;Stand;Lift;Carry;Bend    Examination-Participation [employer];Yard Work;Cleaning;Driving;Interpersonal Relationship;Laundry;Shop    Stability/Clinical Decision Making  Stable/Uncomplicated    Clinical Decision Making  Low    Rehab Potential  Excellent    PT Frequency  3x / week    PT Duration  4 weeks    PT Treatment/Interventions  ADLs/Self Care Home Management;Cryotherapy;Electrical Stimulation;Iontophoresis 4mg /ml Dexamethasone;Moist Heat;Ultrasound;DME Instruction;Neuromuscular re-education;Patient/family education;Manual techniques;Scar mobilization;Passive range of motion;Therapeutic exercise;Therapeutic activities;Functional mobility training;Stair training;Gait training;Balance training;Taping;Aquatic Therapy;Dry needling    PT Next Visit Plan  gait, balance, quad/LE strength    PT Home Exercise Plan  12/17 SLR, hamstrign stretch, knee flexion and extension stretch at edge of bed    Consulted and Agree with Plan of Care  Patient       Patient will benefit from skilled therapeutic intervention in order  to improve the following deficits and impairments:  Abnormal gait, Decreased coordination, Decreased range of motion, Difficulty walking, Increased fascial restricitons, Decreased safety awareness, Decreased activity tolerance, Decreased skin integrity, Pain, Decreased balance, Decreased knowledge of use of DME, Improper body mechanics, Decreased mobility, Decreased strength, Increased edema, Decreased scar mobility  Visit Diagnosis: Difficulty in walking, not elsewhere classified  Chronic pain of left knee     Problem List Patient Active Problem List   Diagnosis  Date Noted  . Status post total left knee replacement 10/17/2019  . Status post total right knee replacement 05/30/2019  . Unilateral primary osteoarthritis, left knee 04/18/2019  . Unilateral primary osteoarthritis, right knee 04/18/2019  . CVA (cerebral vascular accident) ([place]) 08/04/2018  . Stroke ([place]) 08/04/2018  . Chronic back pain 08/04/2018  . Post-operative pain 09/20/2017  . 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 Lumbar stenosis with neurogenic claudication 02/04/2015  . Stiffness of joints, not elsewhere classified, multiple sites 10/27/2012  . Weakness of both legs 10/27/2012  . Difficulty in walking(719.7) 10/27/2012   4:39 PM, 11/02/19 Jerene Pitch, DPT Physical Therapy with [facility]  [phone] office  [facility] [address] [place], [place], [address] Phone: [phone]   Fax:  [phone]  Name: BENINA LILLARD MRN: [id number] Date of Birth: March 03, 1946

## 2019-11-03 ENCOUNTER — Ambulatory Visit (HOSPITAL_COMMUNITY): Payer: Medicare Other

## 2019-11-03 ENCOUNTER — Encounter (HOSPITAL_COMMUNITY): Payer: Self-pay

## 2019-11-03 DIAGNOSIS — R262 Difficulty in walking, not elsewhere classified: Secondary | ICD-10-CM

## 2019-11-03 DIAGNOSIS — M25562 Pain in left knee: Secondary | ICD-10-CM

## 2019-11-03 DIAGNOSIS — I10 Essential (primary) hypertension: Secondary | ICD-10-CM | POA: Diagnosis not present

## 2019-11-03 DIAGNOSIS — E782 Mixed hyperlipidemia: Secondary | ICD-10-CM | POA: Diagnosis not present

## 2019-11-03 DIAGNOSIS — G8929 Other chronic pain: Secondary | ICD-10-CM

## 2019-11-03 DIAGNOSIS — M1711 Unilateral primary osteoarthritis, right knee: Secondary | ICD-10-CM | POA: Diagnosis not present

## 2019-11-03 DIAGNOSIS — M1712 Unilateral primary osteoarthritis, left knee: Secondary | ICD-10-CM | POA: Diagnosis not present

## 2019-11-03 DIAGNOSIS — R7301 Impaired fasting glucose: Secondary | ICD-10-CM | POA: Diagnosis not present

## 2019-11-03 NOTE — Therapy (Signed)
[facility] [address] [place], [place], [address] Phone: [phone]   Fax:  [phone]  Physical Therapy Treatment  Patient Details  Name: [name] MRN: [id number] Date of Birth: February 24, 1946 Referring Provider (PT): Jean Rosenthal    Encounter Date: 11/03/2019  PT End of Session - 11/03/19 1146    Visit Number  2    Number of Visits  12    Date for PT Re-Evaluation  11/30/19    Authorization Type  [employer] Medicare    Authorization Time Period  previous 16 visits used this year. POC dates 11/02/19 -11/30/2019    Authorization - Visit Number  2    Authorization - Number of Visits  10    PT Start Time  [id number]    PT Stop Time  1218    PT Time Calculation (min)  40 min    Equipment Utilized During Treatment  Gait belt    Activity Tolerance  Patient tolerated treatment well    Behavior During Therapy  WFL for tasks assessed/performed       Past Medical History:  Diagnosis Date  . Arthritis   . Cataracts, bilateral    surgery to remove cataracts - bilateral  . Chronic back pain   . Hypertension   . Stroke ([place])   . Wears dentures     Past Surgical History:  Procedure Laterality Date  . ABDOMINAL HYSTERECTOMY  82  . APPENDECTOMY    . BACK SURGERY  2010  . BACK SURGERY  2015  . BACK SURGERY  06/2016  . CATARACT EXTRACTION W/PHACO Left 01/07/2017   Procedure: CATARACT EXTRACTION PHACO AND INTRAOCULAR LENS PLACEMENT ([facility]);  Surgeon: Tonny Branch, MD;  Location: [facility];  Service: Ophthalmology;  Laterality: Left;  left CDE 9.31   . CATARACT EXTRACTION W/PHACO Right 01/25/2017   Procedure: CATARACT EXTRACTION PHACO AND INTRAOCULAR LENS PLACEMENT ([place]) CDE:16.07;  Surgeon: Tonny Branch, MD;  Location[facility];  Service: Ophthalmology;  Laterality: Right;  right  . FRACTURE SURGERY Right    arm  1974  . LUMBAR WOUND DEBRIDEMENT N/A 09/23/2017   Procedure: Exploration of Lumbar Wound; Repair of Pseudomeningocele;  Surgeon: Newman Pies, MD;  Location: [facility];  Service: Neurosurgery;  Laterality: N/A;  . TONSILLECTOMY    . TOTAL KNEE ARTHROPLASTY Right 05/30/2019   Procedure: RIGHT TOTAL KNEE ARTHROPLASTY;  Surgeon: Mcarthur Rossetti, MD;  Location: [facility];  Service: Orthopedics;  Laterality: Right;  . TOTAL KNEE ARTHROPLASTY Left 10/17/2019  . TOTAL KNEE ARTHROPLASTY Left 10/17/2019   Procedure: LEFT TOTAL KNEE ARTHROPLASTY;  Surgeon: Mcarthur Rossetti, MD;  Location: [facility];  Service: Orthopedics;  Laterality: Left;    There were no vitals filed for this visit.  Subjective Assessment - 11/03/19 1136    Subjective  Pt stated her knee is feeling better today.  Pt arrived with walking stick.  Pain scale today 3/10 sore achey pain.    Patient Stated Goals  I need t oget well so I can get back to work    Currently in Pain?  Yes    Pain Score  3     Pain Location  Knee    Pain Orientation  Left    Pain Descriptors / Indicators  Aching;Sore    Pain Onset  1 to 4 weeks ago    Pain Frequency  Intermittent    Aggravating Factors   moving, standing for more than a few minutes    Pain  Relieving Factors  ice, meds, laying her knee on its side    Effect of Pain on Daily Activities  moderate                       [facility] Adult PT Treatment/Exercise - 11/03/19 0001      Exercises   Exercises  Knee/Hip      Knee/Hip Exercises: Stretches   Active Hamstring Stretch  Left;3 reps;30 seconds    Active Hamstring Stretch Limitations  long sitting    Knee: Self-Stretch to increase Flexion  Left;5 reps;10 seconds    Knee: Self-Stretch Limitations  knee drive on [id number] step    Gastroc Stretch  Both;3 reps;30 seconds    Gastroc Stretch Limitations  slant board     Other Knee/Hip Stretches  seated knee flexiona nd extension x10, 10" holds Each L      Knee/Hip Exercises: Standing   Heel Raises  15 reps    Heel Raises Limitations  heel and toe     Terminal Knee Extension  Left;10 reps;Theraband    Theraband  Level (Terminal Knee Extension)  Level 2 (Red)    Terminal Knee Extension Limitations  3 second holds     Rocker Board  2 minutes    Rocker Board Limitations  lateral    Gait Training  258ft cueing for heel to toe mechanics and knee flexion at toe push off      Knee/Hip Exercises: Seated   Long Arc Quad  10 reps    Heel Slides  10 reps    Sit to Sand  5 reps;without UE support   eccentric control     Knee/Hip Exercises: Supine   Quad Sets  AROM;Right;10 reps    Short Arc [employer]  Left;15 reps    Short Arc Quad Sets Limitations  5" holds     Heel Slides  10 reps    Heel Slides Limitations  5" holds    Straight Leg Raises  AROM;Strengthening;Left;2 sets;10 reps    Straight Leg Raises Limitations  quad set     Knee Extension  AROM    Knee Extension Limitations  7    Knee Flexion  AROM    Knee Flexion Limitations  110             PT Education - 11/03/19 1241    Education Details  Reviewed goals and assured compliance with HEP.  Pt able to demonstrate and verbalize appropriate mechanics with current HEP.  Gait training with walking stick, cueing for heel to toe mechanics and equal stride length.    Person(s) Educated  Patient    Methods  Explanation    Comprehension  Verbalized understanding       PT Short Term Goals - 11/02/19 1358      PT SHORT TERM GOAL #1   Title  Patient will be independent in HEP to improve functional outcomes    Time  2    Period  Weeks    Status  New    Target Date  11/16/19      PT SHORT TERM GOAL #2   Title  Patietn will demonstrate at least 2-115 degrees of left knee ROM to improve functional mobility.    Time  0    Period  Weeks    Status  New    Target Date  11/16/19        PT Long Term Goals - 11/02/19 1359  PT LONG TERM GOAL #1   Title  Patient will be able to ambulate at least 226 feet in 2 minutes to improve functional mobility (with least restrictive assistive device as needed).    Time  4    Period  Weeks    Status   New    Target Date  11/30/19      PT LONG TERM GOAL #2   Title  Patient will be able to transition from sit to stance without the use of upper extremities to improve transitional mobility.    Baseline  currently requires bilateral use of upper extremities for transition.    Time  4    Period  Weeks    Status  New    Target Date  11/30/19            Plan - 11/03/19 1243    Clinical Impression Statement  Reviewed goals and assured compliance with HEP.  Pt able to demonstrate and verbalize appropriate mechanics with all exercises.  Session focus on gait training with walking stick and knee ROM.  Added rockerboard to improve weight distribution with gait and stretches/quad strengthening exercises for knee mobility.  Pt progressing well with improved AROM 7-110 degrees.  No reports of increased pain through session.    Personal Factors and Comorbidities  Age;Fitness;Past/Current Experience;Comorbidity 1;Sex;Education;Social Background;Time since onset of injury/illness/exacerbation    Comorbidities  HTN, 5 back surgeries    Examination-Activity Limitations  Bathing;Locomotion Level;Transfers;Bed Mobility;Sit;Sleep;Squat;Stairs;Stand;Lift;Carry;Bend    Stability/Clinical Decision Making  Stable/Uncomplicated    Clinical Decision Making  Low    PT Frequency  3x / week    PT Duration  4 weeks    PT Treatment/Interventions  ADLs/Self Care Home Management;Cryotherapy;Electrical Stimulation;Iontophoresis 4mg /ml Dexamethasone;Moist Heat;Ultrasound;DME Instruction;Neuromuscular re-education;Patient/family education;Manual techniques;Scar mobilization;Passive range of motion;Therapeutic exercise;Therapeutic activities;Functional mobility training;Stair training;Gait training;Balance training;Taping;Aquatic Therapy;Dry needling    PT Next Visit Plan  gait, balance, quad/LE strength    PT Home Exercise Plan  12/17 SLR, hamstrign stretch, knee flexion and extension stretch at edge of bed        Patient will benefit from skilled therapeutic intervention in order to improve the following deficits and impairments:  Abnormal gait, Decreased coordination, Decreased range of motion, Difficulty walking, Increased fascial restricitons, Decreased safety awareness, Decreased activity tolerance, Decreased skin integrity, Pain, Decreased balance, Decreased knowledge of use of DME, Improper body mechanics, Decreased mobility, Decreased strength, Increased edema, Decreased scar mobility  Visit Diagnosis: Difficulty in walking, not elsewhere classified  Chronic pain of left knee     Problem List Patient Active Problem List   Diagnosis Date Noted  . Status post total left knee replacement 10/17/2019  . Status post total right knee replacement 05/30/2019  . Unilateral primary osteoarthritis, left knee 04/18/2019  . Unilateral primary osteoarthritis, right knee 04/18/2019  . CVA (cerebral vascular accident) ([place]) 08/04/2018  . Stroke ([place]) 08/04/2018  . Chronic back pain 08/04/2018  . Post-operative pain 09/20/2017  . 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 Lumbar stenosis with neurogenic claudication 02/04/2015  . Stiffness of joints, not elsewhere classified, multiple sites 10/27/2012  . Weakness of both legs 10/27/2012  . Difficulty in walking(719.7) 10/27/2012   Ihor Austin, [place]; [place]  Aldona Lento 11/03/2019, 12:51 PM  [facility] [place], [place], [address] Phone: [phone]   Fax:  [phone]  Name: TOVA KROGSTAD MRN: [id number] Date of Birth: 1946-04-23

## 2019-11-06 ENCOUNTER — Ambulatory Visit (HOSPITAL_COMMUNITY): Payer: Medicare Other | Admitting: Physical Therapy

## 2019-11-06 ENCOUNTER — Other Ambulatory Visit: Payer: Self-pay

## 2019-11-06 DIAGNOSIS — M25562 Pain in left knee: Secondary | ICD-10-CM | POA: Diagnosis not present

## 2019-11-06 DIAGNOSIS — G8929 Other chronic pain: Secondary | ICD-10-CM | POA: Diagnosis not present

## 2019-11-06 DIAGNOSIS — R262 Difficulty in walking, not elsewhere classified: Secondary | ICD-10-CM

## 2019-11-06 NOTE — Therapy (Signed)
[facility] [address] [place], [place], [address] Phone: [phone]   Fax:  [phone]  Physical Therapy Treatment  Patient Details  Name: [name] MRN: [id number] Date of Birth: 1946/07/14 Referring Provider (PT): Jean Rosenthal    Encounter Date: 11/06/2019  PT End of Session - 11/06/19 1711    Visit Number  3    Number of Visits  12    Date for PT Re-Evaluation  11/30/19    Authorization Type  [employer] Medicare    Authorization Time Period  previous 16 visits used this year. POC dates 11/02/19 -11/30/2019    Authorization - Visit Number  3    Authorization - Number of Visits  10    PT Start Time  1620    PT Stop Time  1700    PT Time Calculation (min)  40 min    Equipment Utilized During Treatment  Gait belt    Activity Tolerance  Patient tolerated treatment well    Behavior During Therapy  WFL for tasks assessed/performed       Past Medical History:  Diagnosis Date  . Arthritis   . Cataracts, bilateral    surgery to remove cataracts - bilateral  . Chronic back pain   . Hypertension   . Stroke ([place])   . Wears dentures     Past Surgical History:  Procedure Laterality Date  . ABDOMINAL HYSTERECTOMY  82  . APPENDECTOMY    . BACK SURGERY  2010  . BACK SURGERY  2015  . BACK SURGERY  06/2016  . CATARACT EXTRACTION W/PHACO Left 01/07/2017   Procedure: CATARACT EXTRACTION PHACO AND INTRAOCULAR LENS PLACEMENT ([facility]);  Surgeon: Tonny Branch, MD;  Location: [facility];  Service: Ophthalmology;  Laterality: Left;  left CDE 9.31   . CATARACT EXTRACTION W/PHACO Right 01/25/2017   Procedure: CATARACT EXTRACTION PHACO AND INTRAOCULAR LENS PLACEMENT ([place]) CDE:16.07;  Surgeon: Tonny Branch, MD;  Location: [facility];  Service: Ophthalmology;  Laterality: Right;  right  . FRACTURE SURGERY Right    arm  1974  . LUMBAR WOUND DEBRIDEMENT N/A 09/23/2017   Procedure: Exploration of Lumbar Wound; Repair of Pseudomeningocele;  Surgeon: Newman Pies, MD;  Location: [facility];  Service: Neurosurgery;  Laterality: N/A;  . TONSILLECTOMY    . TOTAL KNEE ARTHROPLASTY Right 05/30/2019   Procedure: RIGHT TOTAL KNEE ARTHROPLASTY;  Surgeon: Mcarthur Rossetti, MD;  Location: [facility];  Service: Orthopedics;  Laterality: Right;  . TOTAL KNEE ARTHROPLASTY Left 10/17/2019  . TOTAL KNEE ARTHROPLASTY Left 10/17/2019   Procedure: LEFT TOTAL KNEE ARTHROPLASTY;  Surgeon: Mcarthur Rossetti, MD;  Location: [facility];  Service: Orthopedics;  Laterality: Left;    There were no vitals filed for this visit.  Subjective Assessment - 11/06/19 1630    Subjective  pt states she is having 3/10 achey soreness in her Lt knee.  Continues to use her walking stick for ambulation.    Currently in Pain?  Yes    Pain Score  3     Pain Location  Knee    Pain Orientation  Left    Pain Descriptors / Indicators  Aching;Sore                       [facility] Adult PT Treatment/Exercise - 11/06/19 0001      Knee/Hip Exercises: Stretches   Active Hamstring Stretch  Left;3 reps;30 seconds    Active Hamstring Stretch Limitations  12" standing  Knee/Hip Exercises: Standing   Heel Raises  15 reps    Lateral Step Up  Left;Hand Hold: 1;Step Height: 4";10 reps    Forward Step Up  Left;Hand Hold: 1;10 reps;Step Height: 4"    SLS with Vectors  Lt 5X3" each with UE assist    Gait Training  251ft cueing for heel to toe mechanics and knee flexion at toe push off    Other Standing Knee Exercises  tandem gait each LE lead 30" x 2 each      Knee/Hip Exercises: Seated   Long Arc Quad  10 reps    Sit to [employer]  without UE support;10 reps      Knee/Hip Exercises: Supine   Quad Sets  AROM;Right;10 reps    Short Designer, fashion/clothing Limitations  5" holds     Heel Slides  10 reps    Heel Slides Limitations  5" holds    Straight Leg Raises  AROM;Strengthening;Left;2 sets;10 reps    Straight Leg Raises Limitations  quad set     Knee  Extension  AROM    Knee Extension Limitations  5    Knee Flexion  AROM    Knee Flexion Limitations  118      Manual Therapy   Manual Therapy  --    Manual therapy comments  --    Myofascial Release  --               PT Short Term Goals - 11/02/19 1358      PT SHORT TERM GOAL #1   Title  Patient will be independent in HEP to improve functional outcomes    Time  2    Period  Weeks    Status  New    Target Date  11/16/19      PT SHORT TERM GOAL #2   Title  Patietn will demonstrate at least 2-115 degrees of left knee ROM to improve functional mobility.    Time  0    Period  Weeks    Status  New    Target Date  11/16/19        PT Long Term Goals - 11/02/19 1359      PT LONG TERM GOAL #1   Title  Patient will be able to ambulate at least 226 feet in 2 minutes to improve functional mobility (with least restrictive assistive device as needed).    Time  4    Period  Weeks    Status  New    Target Date  11/30/19      PT LONG TERM GOAL #2   Title  Patient will be able to transition from sit to stance without the use of upper extremities to improve transitional mobility.    Baseline  currently requires bilateral use of upper extremities for transition.    Time  4    Period  Weeks    Status  New    Target Date  11/30/19            Plan - 11/06/19 1712    Clinical Impression Statement  pt improving each visit, still with antalgic gait and general sore/stiffness upon standing.  Supine therex completed first and 3 minutes of manual completed to Lt knee with little to no scar tissue palpated or restrictions.  Improved ROM of 5-118. progressed on with lateral and forward step ups as well as static balance.  Pt without  any issues other than some discomfort with attmept at SLS.  Added vectors with UE instead today.    Personal Factors and Comorbidities  Age;Fitness;Past/Current Experience;Comorbidity 1;Sex;Education;Social Background;Time since onset of  injury/illness/exacerbation    Comorbidities  HTN, 5 back surgeries    Examination-Activity Limitations  Bathing;Locomotion Level;Transfers;Bed Mobility;Sit;Sleep;Squat;Stairs;Stand;Lift;Carry;Bend    Stability/Clinical Decision Making  Stable/Uncomplicated    PT Frequency  3x / week    PT Duration  4 weeks    PT Treatment/Interventions  ADLs/Self Care Home Management;Cryotherapy;Electrical Stimulation;Iontophoresis 4mg /ml Dexamethasone;Moist Heat;Ultrasound;DME Instruction;Neuromuscular re-education;Patient/family education;Manual techniques;Scar mobilization;Passive range of motion;Therapeutic exercise;Therapeutic activities;Functional mobility training;Stair training;Gait training;Balance training;Taping;Aquatic Therapy;Dry needling    PT Next Visit Plan  gait, balance, quad/LE strength    PT Home Exercise Plan  12/17 SLR, hamstrign stretch, knee flexion and extension stretch at edge of bed       Patient will benefit from skilled therapeutic intervention in order to improve the following deficits and impairments:  Abnormal gait, Decreased coordination, Decreased range of motion, Difficulty walking, Increased fascial restricitons, Decreased safety awareness, Decreased activity tolerance, Decreased skin integrity, Pain, Decreased balance, Decreased knowledge of use of DME, Improper body mechanics, Decreased mobility, Decreased strength, Increased edema, Decreased scar mobility  Visit Diagnosis: Difficulty in walking, not elsewhere classified  Chronic pain of left knee     Problem List Patient Active Problem List   Diagnosis Date Noted  . Status post total left knee replacement 10/17/2019  . Status post total right knee replacement 05/30/2019  . Unilateral primary osteoarthritis, left knee 04/18/2019  . Unilateral primary osteoarthritis, right knee 04/18/2019  . CVA (cerebral vascular accident) ([place]) 08/04/2018  . Stroke ([place]) 08/04/2018  . Chronic back pain 08/04/2018  .  Post-operative pain 09/20/2017  . 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 Lumbar stenosis with neurogenic claudication 02/04/2015  . Stiffness of joints, not elsewhere classified, multiple sites 10/27/2012  . Weakness of both legs 10/27/2012  . Difficulty in walking(719.7) 10/27/2012   Teena Irani, PTA/CLT [phone]  Teena Irani 11/06/2019, 5:15 PM  [facility] [place], [place], [address] Phone: [phone]   Fax:  [phone]  Name: SURIYAH BERNTHAL MRN: [id number] Date of Birth: 27-Jan-1946

## 2019-11-07 ENCOUNTER — Encounter (HOSPITAL_COMMUNITY): Payer: Self-pay

## 2019-11-07 ENCOUNTER — Ambulatory Visit (HOSPITAL_COMMUNITY): Payer: Medicare Other

## 2019-11-07 ENCOUNTER — Other Ambulatory Visit: Payer: Self-pay

## 2019-11-07 DIAGNOSIS — M25562 Pain in left knee: Secondary | ICD-10-CM | POA: Diagnosis not present

## 2019-11-07 DIAGNOSIS — G8929 Other chronic pain: Secondary | ICD-10-CM

## 2019-11-07 DIAGNOSIS — R262 Difficulty in walking, not elsewhere classified: Secondary | ICD-10-CM

## 2019-11-07 NOTE — Therapy (Signed)
[facility] [address] [place], [place], [address] Phone: [phone]   Fax:  [phone]  Physical Therapy Treatment  Patient Details  Name: [name] MRN: [id number] Date of Birth: [birth year] Referring Provider (PT): Jean Rosenthal    Encounter Date: 11/07/2019  PT End of Session - 11/07/19 1130    Visit Number  4    Number of Visits  12    Date for PT Re-Evaluation  11/30/19    Authorization Type  [employer] Medicare    Authorization Time Period  previous 16 visits used this year. POC dates 11/02/19 -11/30/2019    Authorization - Visit Number  4    Authorization - Number of Visits  10    PT Start Time  [id number]    PT Stop Time  1210    PT Time Calculation (min)  46 min    Equipment Utilized During Treatment  Gait belt    Activity Tolerance  Patient tolerated treatment well    Behavior During Therapy  WFL for tasks assessed/performed       Past Medical History:  Diagnosis Date  . Arthritis   . Cataracts, bilateral    surgery to remove cataracts - bilateral  . Chronic back pain   . Hypertension   . Stroke ([place])   . Wears dentures     Past Surgical History:  Procedure Laterality Date  . ABDOMINAL HYSTERECTOMY  82  . APPENDECTOMY    . BACK SURGERY  2010  . BACK SURGERY  2015  . BACK SURGERY  06/2016  . CATARACT EXTRACTION W/PHACO Left 01/07/2017   Procedure: CATARACT EXTRACTION PHACO AND INTRAOCULAR LENS PLACEMENT ([facility]);  Surgeon: Tonny Branch, MD;  Location: [facility];  Service: Ophthalmology;  Laterality: Left;  left CDE 9.31   . CATARACT EXTRACTION W/PHACO Right 01/25/2017   Procedure: CATARACT EXTRACTION PHACO AND INTRAOCULAR LENS PLACEMENT ([facility] [facility]) CDE:16.07;  Surgeon: Tonny Branch, MD;  Location: [facility];  Service: Ophthalmology;  Laterality: Right;  right  . FRACTURE SURGERY Right    arm  1974  . LUMBAR WOUND DEBRIDEMENT N/A 09/23/2017   Procedure: Exploration of Lumbar Wound; Repair of Pseudomeningocele;  Surgeon: Newman Pies, MD;  Location: [facility];  Service: Neurosurgery;  Laterality: N/A;  . TONSILLECTOMY    . TOTAL KNEE ARTHROPLASTY Right 05/30/2019   Procedure: RIGHT TOTAL KNEE ARTHROPLASTY;  Surgeon: Mcarthur Rossetti, MD;  Location: [facility];  Service: Orthopedics;  Laterality: Right;  . TOTAL KNEE ARTHROPLASTY Left 10/17/2019  . TOTAL KNEE ARTHROPLASTY Left 10/17/2019   Procedure: LEFT TOTAL KNEE ARTHROPLASTY;  Surgeon: Mcarthur Rossetti, MD;  Location: [facility];  Service: Orthopedics;  Laterality: Left;    There were no vitals filed for this visit.  Subjective Assessment - 11/07/19 1128    Subjective  Pt reports she is still using her walking stick because she doesn't feel steady. Pt reports nothing is hard at home, but she still feels weak and sore with activity. Pt reports stopped pain medication yesterday and so far no intense pain.    How long can you sit comfortably?  8/24- 60 minutes    How long can you stand comfortably?  a couple minutes    How long can you walk comfortably?  can walk room to room - a couple minutes    Patient Stated Goals  I need t oget well so I can get back to work    Currently in Pain?  Yes  Pain Score  3     Pain Location  Knee    Pain Orientation  Left    Pain Descriptors / Indicators  Aching;Sore    Pain Type  Surgical pain    Pain Radiating Towards  none    Pain Onset  1 to 4 weeks ago    Pain Frequency  Intermittent    Aggravating Factors   moving, standing for more than a few minutes    Pain Relieving Factors  ice, meds, laying her knee on its side    Effect of Pain on Daily Activities  moderate            [facility] Adult PT Treatment/Exercise - 11/07/19 0001      Ambulation/Gait   Ambulation Distance (Feet)  226 Feet    Assistive device  --   walking stick   Gait Comments  focus on heel-toe pattern, weight-shifting to LLE to improve equal bil step length      Knee/Hip Exercises: Stretches   Active Hamstring Stretch  Left;3 reps;30 seconds     Active Hamstring Stretch Limitations  12" standing    Knee: Self-Stretch to increase Flexion  Left    Knee: Self-Stretch Limitations  knee drives on 12" step, [id number] sec hold      Knee/Hip Exercises: Aerobic   Recumbent Bike  seat 10, 4 minutes, back and forth with occasional full revolutions      Knee/Hip Exercises: Standing   Terminal Knee Extension  Left;2 sets;10 reps    Theraband Level (Terminal Knee Extension)  Level 2 (Red)    Terminal Knee Extension Limitations  3 second holds     Lateral Step Up  Left;15 reps;Step Height: 4";Hand Hold: 1    Forward Step Up  Left;15 reps;Step Height: 4";Hand Hold: 1      Knee/Hip Exercises: Supine   Knee Extension  AROM    Knee Extension Limitations  6   from extension   Knee Flexion  AROM    Knee Flexion Limitations  116          Balance Exercises - 11/07/19 1144      Balance Exercises: Standing   Tandem Stance  Eyes open;2 reps;30 secs   each leg back   Step Over Hurdles / Cones  3 6" hurdles, x3RT, walking stick and contact guard assist    Other Standing Exercises  weaving around 5 cones, 18 inches apart, walking stick, contact guard assist        PT Education - 11/07/19 1130    Education Details  Continue HEP, exercise technique    Person(s) Educated  Patient    Methods  Explanation    Comprehension  Verbalized understanding       PT Short Term Goals - 11/02/19 1358      PT SHORT TERM GOAL #1   Title  Patient will be independent in HEP to improve functional outcomes    Time  2    Period  Weeks    Status  New    Target Date  11/16/19      PT SHORT TERM GOAL #2   Title  Patietn will demonstrate at least 2-115 degrees of left knee ROM to improve functional mobility.    Time  0    Period  Weeks    Status  New    Target Date  11/16/19        PT Long Term Goals - 11/02/19 1359      PT LONG  TERM GOAL #1   Title  Patient will be able to ambulate at least 226 feet in 2 minutes to improve functional mobility (with  least restrictive assistive device as needed).    Time  4    Period  Weeks    Status  New    Target Date  11/30/19      PT LONG TERM GOAL #2   Title  Patient will be able to transition from sit to stance without the use of upper extremities to improve transitional mobility.    Baseline  currently requires bilateral use of upper extremities for transition.    Time  4    Period  Weeks    Status  New    Target Date  11/30/19            Plan - 11/07/19 1143    Clinical Impression Statement  Added recumbent bike this date to encourage knee flexion, pt able to occasionally complete full revolution, but performs half revolutions majority of the time back and forth due to lack of available ROM. Pt demonstrates quick weight-shift off LLE when ambulating, slightly decreasing R step length, and decreased heel-toe pattern improving with distance and verbal cues. Rest of session focused on balance due to pt's concerns with balance during activity. Able to maintain tandem stance for 5-8 sec increments in // bars. Added weaving around cones and stepping over 6" hurdles, demonstrating occasional shuffling step progression around cones improving with verbal cues. Pt initially with significant difficulty requiring min assist with clearing over 6 inch hurdles, improving with education on sequencing and walking stick to assist balance. Pt with increase in pain from 3 to 4/10 at EOS. Pt's AROM 6-116 this date. Pt requesting to d/c next session due to being pleased with functional status so plan to reassess, finalize HEP and d/c pending assessment.    Personal Factors and Comorbidities  Age;Fitness;Past/Current Experience;Comorbidity 1;Sex;Education;Social Background;Time since onset of injury/illness/exacerbation    Comorbidities  HTN, 5 back surgeries    Examination-Activity Limitations  Bathing;Locomotion Level;Transfers;Bed Mobility;Sit;Sleep;Squat;Stairs;Stand;Lift;Carry;Bend    Stability/Clinical Decision  Making  Stable/Uncomplicated    PT Frequency  3x / week    PT Duration  4 weeks    PT Treatment/Interventions  ADLs/Self Care Home Management;Cryotherapy;Electrical Stimulation;Iontophoresis 4mg /ml Dexamethasone;Moist Heat;Ultrasound;DME Instruction;Neuromuscular re-education;Patient/family education;Manual techniques;Scar mobilization;Passive range of motion;Therapeutic exercise;Therapeutic activities;Functional mobility training;Stair training;Gait training;Balance training;Taping;Aquatic Therapy;Dry needling    PT Next Visit Plan  Per pt request, review goals, HEP and d/c due to pleased with functional status    PT Home Exercise Plan  12/17 SLR, hamstrign stretch, knee flexion and extension stretch at edge of bed    Consulted and Agree with Plan of Care  Patient       Patient will benefit from skilled therapeutic intervention in order to improve the following deficits and impairments:  Abnormal gait, Decreased coordination, Decreased range of motion, Difficulty walking, Increased fascial restricitons, Decreased safety awareness, Decreased activity tolerance, Decreased skin integrity, Pain, Decreased balance, Decreased knowledge of use of DME, Improper body mechanics, Decreased mobility, Decreased strength, Increased edema, Decreased scar mobility  Visit Diagnosis: Difficulty in walking, not elsewhere classified  Chronic pain of left knee     Problem List Patient Active Problem List   Diagnosis Date Noted  . Status post total left knee replacement 10/17/2019  . Status post total right knee replacement 05/30/2019  . Unilateral primary osteoarthritis, left knee 04/18/2019  . Unilateral primary osteoarthritis, right knee 04/18/2019  . CVA (cerebral vascular accident) (  [place]) 08/04/2018  . Stroke ([place]) 08/04/2018  . Chronic back pain 08/04/2018  . Post-operative pain 09/20/2017  . 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 Lumbar stenosis with neurogenic claudication 02/04/2015  .  Stiffness of joints, not elsewhere classified, multiple sites 10/27/2012  . Weakness of both legs 10/27/2012  . Difficulty in walking(719.7) 10/27/2012    Talbot Grumbling PT, DPT 11/07/19, 12:16 PM [facility] [place], [place], [address] Phone: [phone]   Fax:  [phone]  Name: [name] MRN: [id number] Date of Birth: 05/05/1946

## 2019-11-13 ENCOUNTER — Other Ambulatory Visit: Payer: Self-pay

## 2019-11-13 ENCOUNTER — Ambulatory Visit (HOSPITAL_COMMUNITY): Payer: Medicare Other | Admitting: Physical Therapy

## 2019-11-13 ENCOUNTER — Encounter (HOSPITAL_COMMUNITY): Payer: Self-pay | Admitting: Physical Therapy

## 2019-11-13 DIAGNOSIS — M25562 Pain in left knee: Secondary | ICD-10-CM

## 2019-11-13 DIAGNOSIS — R262 Difficulty in walking, not elsewhere classified: Secondary | ICD-10-CM | POA: Diagnosis not present

## 2019-11-13 DIAGNOSIS — G8929 Other chronic pain: Secondary | ICD-10-CM | POA: Diagnosis not present

## 2019-11-13 NOTE — Patient Instructions (Signed)
Access Code: [email or web address]  URL: [email or web address]  Date: 11/13/2019  Prepared by: Theodoros Stjames [place] on [employer] - 3 reps - 20-30 seconds hold - 1x daily - 7x weekly Sit to Stand with Arms Crossed - 10 reps - 2 sets - 1x daily - 7x weekly Step Up - 8 reps - 2 sets - 1x daily - 7x weekly

## 2019-11-13 NOTE — Therapy (Signed)
[place], [place], [address] Phone: [phone]   Fax:  [phone]  Physical Therapy Treatment/Discharge Summary  Patient Details  Name: [name] MRN: [id number] Date of Birth: Feb 24, 1946 Referring Provider (PT): Jean Rosenthal    Encounter Date: 11/13/2019  PHYSICAL THERAPY DISCHARGE SUMMARY  Visits from Start of Care: 5  Current functional level related to goals / functional outcomes: See below   Remaining deficits: See below   Education / Equipment: See below  Plan: Patient agrees to discharge.  Patient goals were partially met. Patient is being discharged due to being pleased with the current functional level.  ?????       PT End of Session - 11/13/19 0928    Visit Number  5    Number of Visits  12    Date for PT Re-Evaluation  11/30/19    Authorization Type  [employer] Medicare    Authorization Time Period  previous 16 visits used this year. POC dates 11/02/19 -11/30/2019    Authorization - Visit Number  5    Authorization - Number of Visits  10    PT Start Time  0900    PT Stop Time  0939    PT Time Calculation (min)  39 min    Equipment Utilized During Treatment  Gait belt    Activity Tolerance  Patient tolerated treatment well    Behavior During Therapy  WFL for tasks assessed/performed       Past Medical History:  Diagnosis Date  . Arthritis   . Cataracts, bilateral    surgery to remove cataracts - bilateral  . Chronic back pain   . Hypertension   . Stroke ([place])   . Wears dentures     Past Surgical History:  Procedure Laterality Date  . ABDOMINAL HYSTERECTOMY  82  . APPENDECTOMY    . BACK SURGERY  2010  . BACK SURGERY  2015  . BACK SURGERY  06/2016  . CATARACT EXTRACTION W/PHACO Left 01/07/2017   Procedure: CATARACT EXTRACTION PHACO AND INTRAOCULAR LENS PLACEMENT (IOC);  Surgeon: Tonny Branch, MD;  Location: [facility];  Service: Ophthalmology;  Laterality: Left;  left CDE  9.31   . CATARACT EXTRACTION W/PHACO Right 01/25/2017   Procedure: CATARACT EXTRACTION PHACO AND INTRAOCULAR LENS PLACEMENT ([place]) CDE:16.07;  Surgeon: Tonny Branch, MD;  Location: [facility];  Service: Ophthalmology;  Laterality: Right;  right  . FRACTURE SURGERY Right    arm  1974  . LUMBAR WOUND DEBRIDEMENT N/A 09/23/2017   Procedure: Exploration of Lumbar Wound; Repair of Pseudomeningocele;  Surgeon: Newman Pies, MD;  Location: [facility];  Service: Neurosurgery;  Laterality: N/A;  . TONSILLECTOMY    . TOTAL KNEE ARTHROPLASTY Right 05/30/2019   Procedure: RIGHT TOTAL KNEE ARTHROPLASTY;  Surgeon: Mcarthur Rossetti, MD;  Location: [facility];  Service: Orthopedics;  Laterality: Right;  . TOTAL KNEE ARTHROPLASTY Left 10/17/2019  . TOTAL KNEE ARTHROPLASTY Left 10/17/2019   Procedure: LEFT TOTAL KNEE ARTHROPLASTY;  Surgeon: Mcarthur Rossetti, MD;  Location: [facility];  Service: Orthopedics;  Laterality: Left;    There were no vitals filed for this visit.  Subjective Assessment - 11/13/19 0859    Subjective  Patient has been walking with her walking stick and feels safer with it. Feels unbalanced without it. Patient states that she has made 80% improvement with physical therapy. She remains limited with standing, pain, and strength. Her home exercises have been going well. She continues to have  some pain and wishes to walk more to get stronger. She wishes to make today her last day of therapy.    How long can you sit comfortably?  8/24- 60 minutes    How long can you stand comfortably?  a couple minutes    How long can you walk comfortably?  can walk room to room - a couple minutes    Patient Stated Goals  I need t oget well so I can get back to work    Currently in Pain?  Yes    Pain Score  3    worst 7-8/10   Pain Location  Knee    Pain Onset  1 to 4 weeks ago         [facility] PT Assessment - 11/13/19 0001      Assessment   Medical Diagnosis  s/p R TKR     Referring Provider (PT)   Jean Rosenthal     Onset Date/Surgical Date  10/17/19    Next MD Visit  11/28/19    Prior Therapy  yes       Precautions   Precautions  None      Restrictions   Weight Bearing Restrictions  No      Balance Screen   Has the patient fallen in the past 6 months  No    Has the patient had a decrease in activity level because of a fear of falling?   Yes    Is the patient reluctant to leave their home because of a fear of falling?   No      Home Film/video editor residence      Prior Function   Level of Independence  Independent;Independent with basic ADLs      Cognition   Overall Cognitive Status  Within Functional Limits for tasks assessed      Observation/Other Assessments   Focus on Therapeutic Outcomes (FOTO)   50 % limited                   [facility] Adult PT Treatment/Exercise - 11/13/19 0001      Ambulation/Gait   Ambulation/Gait  Yes    Ambulation/Gait Assistance  7: Independent    Ambulation Distance (Feet)  275 Feet    Assistive device  --   walking stick   Gait Pattern  Decreased step length - right;Decreased step length - left;Decreased stance time - right;Decreased hip/knee flexion - right;Decreased hip/knee flexion - left;Decreased dorsiflexion - right;Trunk flexed;Antalgic    Ambulation Surface  Level;Indoor    Gait Comments  2 MWT      Knee/Hip Exercises: Stretches   Press photographer  Both;3 reps;30 seconds    Gastroc Stretch Limitations  at wall      Knee/Hip Exercises: Standing   Forward Step Up  3 sets;10 reps;Hand Hold: 2;Step Height: 6"      Knee/Hip Exercises: Seated   Sit to Sand  2 sets;10 reps;without UE support      Knee/Hip Exercises: Supine   Knee Extension  AROM    Knee Extension Limitations  5    Knee Flexion  AROM    Knee Flexion Limitations  116             PT Education - 11/13/19 0926    Education Details  Patient educated on HEP, exercise technique, returning to PT should she wish to  make more progress, ways to progress exercises, beginning walking    Person(s) Educated  Patient    Methods  Explanation;Handout    Comprehension  Verbalized understanding       PT Short Term Goals - 11/13/19 0933      PT SHORT TERM GOAL #1   Title  Patient will be independent in HEP to improve functional outcomes    Time  2    Period  Weeks    Status  Achieved    Target Date  11/16/19      PT SHORT TERM GOAL #2   Title  Patietn will demonstrate at least 2-115 degrees of left knee ROM to improve functional mobility.    Time  0    Period  Weeks    Status  Partially Met    Target Date  11/16/19        PT Long Term Goals - 11/13/19 0933      PT LONG TERM GOAL #1   Title  Patient will be able to ambulate at least 226 feet in 2 minutes to improve functional mobility (with least restrictive assistive device as needed).    Baseline  11/13/19 275 feet    Time  4    Period  Weeks    Status  Achieved      PT LONG TERM GOAL #2   Title  Patient will be able to transition from sit to stance without the use of upper extremities to improve transitional mobility.    Baseline  --    Time  4    Period  Weeks    Status  Achieved            Plan - 11/13/19 4010    Clinical Impression Statement  Patient has met 1 and partially met 1 of her short term goals with compliance with her HEP improving ROM. She has met 2/2 long term goals with improved ambulation speed and distance and ease of transitions with sit to stand. Patient has made progress in reductions in pain, improvements in ROM, strength, gait and functional mobility. She continues to remain limited with end range knee extension, quad/hip strength, and with gait mechanics and balance. Patient wishes to be discharged from physical therapy at this time. She is educated on performing HEP, returning to physical therapy if her strength does not improve or should she want to improve further. Patient discharged from physical therapy at  this time.    Personal Factors and Comorbidities  Age;Fitness;Past/Current Experience;Comorbidity 1;Sex;Education;Social Background;Time since onset of injury/illness/exacerbation    Comorbidities  HTN, 5 back surgeries    Examination-Activity Limitations  Bathing;Locomotion Level;Transfers;Bed Mobility;Sit;Sleep;Squat;Stairs;Stand;Lift;Carry;Bend    Stability/Clinical Decision Making  --    Rehab Potential  Excellent    PT Frequency  --    PT Duration  --    PT Treatment/Interventions  ADLs/Self Care Home Management;Cryotherapy;Electrical Stimulation;Iontophoresis 34m/ml Dexamethasone;Moist Heat;Ultrasound;DME Instruction;Neuromuscular re-education;Patient/family education;Manual techniques;Scar mobilization;Passive range of motion;Therapeutic exercise;Therapeutic activities;Functional mobility training;Stair training;Gait training;Balance training;Taping;Aquatic Therapy;Dry needling    PT Next Visit Plan  n/a patient discharged    PT Home Exercise Plan  12/17 SLR, hamstrign stretch, knee flexion and extension stretch at edge of bed 11/13/19 calf stretch at wall, sit to stand 2x10, step up 2x8    Consulted and Agree with Plan of Care  Patient       Patient will benefit from skilled therapeutic intervention in order to improve the following deficits and impairments:  Abnormal gait, Decreased coordination, Decreased range of motion, Difficulty walking, Increased fascial restricitons, Decreased safety awareness, Decreased activity tolerance, Decreased  skin integrity, Pain, Decreased balance, Decreased knowledge of use of DME, Improper body mechanics, Decreased mobility, Decreased strength, Increased edema, Decreased scar mobility  Visit Diagnosis: Difficulty in walking, not elsewhere classified  Chronic pain of left knee     Problem List Patient Active Problem List   Diagnosis Date Noted  . Status post total left knee replacement 10/17/2019  . Status post total right knee replacement  05/30/2019  . Unilateral primary osteoarthritis, left knee 04/18/2019  . Unilateral primary osteoarthritis, right knee 04/18/2019  . CVA (cerebral vascular accident) ([place]) 08/04/2018  . Stroke ([place]) 08/04/2018  . Chronic back pain 08/04/2018  . Post-operative pain 09/20/2017  . 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 Lumbar stenosis with neurogenic claudication 02/04/2015  . Stiffness of joints, not elsewhere classified, multiple sites 10/27/2012  . Weakness of both legs 10/27/2012  . Difficulty in walking(719.7) 10/27/2012    10:47 AM, 11/13/19 Mearl Latin PT, DPT Physical Therapist at [place], [place], [address] Phone: [phone]   Fax:  [phone]  Name: SEHAM GARDENHIRE MRN: [id number] Date of Birth: [birth year]

## 2019-11-16 ENCOUNTER — Encounter

## 2019-11-20 ENCOUNTER — Telehealth: Payer: Self-pay | Admitting: *Deleted

## 2019-11-20 NOTE — Care Plan (Signed)
RNCM called patient to discuss status at 30 days post-op. She verbalized she is doing well overall, but is still having pain in the knee, which causes her to take pain medication at least 1 x daily. She states she may need a refill of medication, but will call back when she is ready. She states she has already completed OPPT as the therapist told her she had reached her goals. Reinforced continuing to do home exercises. She indicated she did not wish to pursue more therapy at this time. F/U next week with Dr. Ninfa Linden.  Reviewed patient satisfaction survey provided by [facility] in place of 10 question survey in Epic. The following are the results of the survey.  1. Before surgery, I was provided sufficient education regarding my surgery and the bundle program. Patient answer: Strongly agree 2. I was satisfied with the care provided by the nurse at the facility where my surgery was performed. Patient answer: Strongly agree 3. Following surgery, I received sufficient postoperative care instructions.  Patient answer: Strongly agree 4. I would recommend my surgeon and this bundle program to others.  Patient answer: Strongly agree Other comments:

## 2019-11-20 NOTE — Telephone Encounter (Signed)
30 day Ortho bundle call completed.

## 2019-11-23 ENCOUNTER — Other Ambulatory Visit: Payer: Self-pay | Admitting: Orthopaedic Surgery

## 2019-11-23 NOTE — Telephone Encounter (Signed)
Please advise 

## 2019-11-27 DIAGNOSIS — M1712 Unilateral primary osteoarthritis, left knee: Secondary | ICD-10-CM | POA: Diagnosis not present

## 2019-11-27 DIAGNOSIS — I10 Essential (primary) hypertension: Secondary | ICD-10-CM | POA: Diagnosis not present

## 2019-11-27 DIAGNOSIS — M1711 Unilateral primary osteoarthritis, right knee: Secondary | ICD-10-CM | POA: Diagnosis not present

## 2019-11-27 DIAGNOSIS — R7301 Impaired fasting glucose: Secondary | ICD-10-CM | POA: Diagnosis not present

## 2019-11-27 DIAGNOSIS — E782 Mixed hyperlipidemia: Secondary | ICD-10-CM | POA: Diagnosis not present

## 2019-11-28 ENCOUNTER — Other Ambulatory Visit: Payer: Self-pay

## 2019-11-28 ENCOUNTER — Ambulatory Visit (INDEPENDENT_AMBULATORY_CARE_PROVIDER_SITE_OTHER): Payer: Medicare Other | Admitting: Orthopaedic Surgery

## 2019-11-28 ENCOUNTER — Encounter: Payer: Self-pay | Admitting: Orthopaedic Surgery

## 2019-11-28 DIAGNOSIS — F5101 Primary insomnia: Secondary | ICD-10-CM | POA: Diagnosis not present

## 2019-11-28 DIAGNOSIS — M792 Neuralgia and neuritis, unspecified: Secondary | ICD-10-CM | POA: Diagnosis not present

## 2019-11-28 DIAGNOSIS — R7301 Impaired fasting glucose: Secondary | ICD-10-CM | POA: Diagnosis not present

## 2019-11-28 DIAGNOSIS — Z96652 Presence of left artificial knee joint: Secondary | ICD-10-CM

## 2019-11-28 DIAGNOSIS — H269 Unspecified cataract: Secondary | ICD-10-CM | POA: Diagnosis not present

## 2019-11-28 NOTE — Progress Notes (Signed)
The patient is now 6 weeks status post a left total knee arthroplasty.  She is 6 months status post a right total knee arthroplasty.  She is doing well overall.  She is a very active 74 years old.  She reports increased range of motion and strength.  She feels like she is ready to work again.  She does ambulate with a walking stick.  On examination of her left operative knee her extension is full and her flexion is 210 degrees.  The incision looks good.  There is no significant swelling.  Her calf is soft.  Her right knee is doing excellent.  Plan we will see her back in 1 more time in 4 weeks if she still needing some pain medication.  No x-rays needed at that visit.  If she looks good at that visit we do not need to see her back for probably 6 months.  All question concerns were answered and addressed.

## 2019-12-04 DIAGNOSIS — E782 Mixed hyperlipidemia: Secondary | ICD-10-CM | POA: Diagnosis not present

## 2019-12-04 DIAGNOSIS — M48062 Spinal stenosis, lumbar region with neurogenic claudication: Secondary | ICD-10-CM | POA: Diagnosis not present

## 2019-12-04 DIAGNOSIS — I10 Essential (primary) hypertension: Secondary | ICD-10-CM | POA: Diagnosis not present

## 2019-12-04 DIAGNOSIS — R7301 Impaired fasting glucose: Secondary | ICD-10-CM | POA: Diagnosis not present

## 2019-12-04 DIAGNOSIS — D509 Iron deficiency anemia, unspecified: Secondary | ICD-10-CM | POA: Diagnosis not present

## 2019-12-25 DIAGNOSIS — M1712 Unilateral primary osteoarthritis, left knee: Secondary | ICD-10-CM | POA: Diagnosis not present

## 2019-12-25 DIAGNOSIS — M1711 Unilateral primary osteoarthritis, right knee: Secondary | ICD-10-CM | POA: Diagnosis not present

## 2019-12-25 DIAGNOSIS — R7301 Impaired fasting glucose: Secondary | ICD-10-CM | POA: Diagnosis not present

## 2019-12-25 DIAGNOSIS — E782 Mixed hyperlipidemia: Secondary | ICD-10-CM | POA: Diagnosis not present

## 2019-12-25 DIAGNOSIS — I10 Essential (primary) hypertension: Secondary | ICD-10-CM | POA: Diagnosis not present

## 2019-12-26 ENCOUNTER — Other Ambulatory Visit: Payer: Self-pay

## 2019-12-26 ENCOUNTER — Ambulatory Visit (INDEPENDENT_AMBULATORY_CARE_PROVIDER_SITE_OTHER): Payer: Medicare Other | Admitting: Orthopaedic Surgery

## 2019-12-26 ENCOUNTER — Encounter: Payer: Self-pay | Admitting: Orthopaedic Surgery

## 2019-12-26 ENCOUNTER — Ambulatory Visit: Payer: Self-pay

## 2019-12-26 DIAGNOSIS — Z96652 Presence of left artificial knee joint: Secondary | ICD-10-CM | POA: Diagnosis not present

## 2019-12-26 NOTE — Progress Notes (Signed)
Office Visit Note   Patient: [name]           Date of Birth: 1945/11/17           MRN: [id number] Visit Date: 12/26/2019              Requested by: Celene Squibb, MD [place],  [place] [id number] PCP: Celene Squibb, MD   Assessment & Plan: Visit Diagnoses:  1. Status post total left knee replacement     Plan:  Continue work on range of motion strengthening of the knee.  She is given a prescription for thigh-high compression hose which recommended she wear at least on the left leg.  She can wear on both legs during the day.  She is remove these at night.  She will follow-up with [place] in 3 months sooner with any questions concerns.  Reassurance was given by Dr. Ninfa Linden and myself.  Follow-Up Instructions: Return in about 3 months (around 03/24/2020).   Orders:  Orders Placed This Encounter  Procedures  . XR Knee 1-2 Views Left   No orders of the defined types were placed in this encounter.     Procedures: No procedures performed   Clinical Data: No additional findings.   Subjective: Chief Complaint  Patient presents with  . Left Knee - Follow-up, Routine Post Op    HPI  Mrs. [name] returns today 10 weeks status post left total knee arthroplasty.  She states that she is having swelling pain in the knee after being up on it all night.  She is back at work.  She has had no chest pain shortness of breath fevers or chills.  Review of Systems  Constitutional: Negative for chills and fever.  Respiratory: Negative for shortness of breath.   Cardiovascular: Negative for chest pain.  Musculoskeletal: Positive for arthralgias.     Objective: Vital Signs: LMP  (LMP Unknown)   Physical Exam General: Well-developed well-nourished female no acute distress mood and affect appropriate Ortho Exam Left knee full extension flexion 210 degrees easily.  No instability valgus varus stressing.  No abnormal warmth or erythema.  Surgical incisions well-healed.  Calf  supple.  Minimal tenderness.  There is no significant edema of the left lower leg on exam today. Specialty Comments:  No specialty comments available.  Imaging: No results found.   PMFS History: Patient Active Problem List   Diagnosis Date Noted  . Status post total left knee replacement 10/17/2019  . Status post total right knee replacement 05/30/2019  . Unilateral primary osteoarthritis, left knee 04/18/2019  . Unilateral primary osteoarthritis, right knee 04/18/2019  . CVA (cerebral vascular accident) ([place]) 08/04/2018  . Stroke ([place]) 08/04/2018  . Chronic back pain 08/04/2018  . Post-operative pain 09/20/2017  . 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 Lumbar stenosis with neurogenic claudication 02/04/2015  . Stiffness of joints, not elsewhere classified, multiple sites 10/27/2012  . Weakness of both legs 10/27/2012  . Difficulty in walking(719.7) 10/27/2012   Past Medical History:  Diagnosis Date  . Arthritis   . Cataracts, bilateral    surgery to remove cataracts - bilateral  . Chronic back pain   . Hypertension   . Stroke ([place])   . Wears dentures     Family History  Problem Relation Age of Onset  . Hypertension Mother   . Breast cancer Neg Hx   . Stroke Neg Hx     Past Surgical History:  Procedure Laterality Date  .  ABDOMINAL HYSTERECTOMY  82  . APPENDECTOMY    . BACK SURGERY  2010  . BACK SURGERY  2015  . BACK SURGERY  06/2016  . CATARACT EXTRACTION W/PHACO Left 01/07/2017   Procedure: CATARACT EXTRACTION PHACO AND INTRAOCULAR LENS PLACEMENT (IOC);  Surgeon: Tonny Branch, MD;  Location: [facility];  Service: Ophthalmology;  Laterality: Left;  left CDE 9.31   . CATARACT EXTRACTION W/PHACO Right 01/25/2017   Procedure: CATARACT EXTRACTION PHACO AND INTRAOCULAR LENS PLACEMENT ([place]) CDE:16.07;  Surgeon: Tonny Branch, MD;  Location: [facility];  Service: Ophthalmology;  Laterality: Right;  right  . FRACTURE SURGERY Right    arm  1974  . LUMBAR WOUND DEBRIDEMENT N/A  09/23/2017   Procedure: Exploration of Lumbar Wound; Repair of Pseudomeningocele;  Surgeon: Newman Pies, MD;  Location: [facility];  Service: Neurosurgery;  Laterality: N/A;  . TONSILLECTOMY    . TOTAL KNEE ARTHROPLASTY Right 05/30/2019   Procedure: RIGHT TOTAL KNEE ARTHROPLASTY;  Surgeon: Mcarthur Rossetti, MD;  Location: [facility];  Service: Orthopedics;  Laterality: Right;  . TOTAL KNEE ARTHROPLASTY Left 10/17/2019  . TOTAL KNEE ARTHROPLASTY Left 10/17/2019   Procedure: LEFT TOTAL KNEE ARTHROPLASTY;  Surgeon: Mcarthur Rossetti, MD;  Location: [facility];  Service: Orthopedics;  Laterality: Left;   Social History   Occupational History  . Not on file  Tobacco Use  . Smoking status: Never Smoker  . Smokeless tobacco: Never Used  Substance and Sexual Activity  . Alcohol use: No  . Drug use: No  . Sexual activity: Not Currently    Birth control/protection: Post-menopausal

## 2020-01-17 ENCOUNTER — Telehealth: Payer: Self-pay | Admitting: *Deleted

## 2020-01-17 NOTE — Telephone Encounter (Signed)
Attempted 90 day Ortho bundle call. Left VM on patient's home phone number requesting call back.

## 2020-01-19 ENCOUNTER — Telehealth: Payer: Self-pay | Admitting: *Deleted

## 2020-01-19 NOTE — Telephone Encounter (Signed)
90 day Ortho bundle call completed with survey.

## 2020-01-19 NOTE — Care Plan (Signed)
90 day Ortho bundle call. Patient returned CM's call and VM left earlier in the week. She reports she is doing ok from the knee, but her back is making physical activity difficult. (She has had 5 previous back surgeries). Difficulty with weakness in lower extremities related to back as well. Reviewed 90 day [place], Brooke Bonito. Reminded patient to call office with any questions or concerns.

## 2020-01-24 DIAGNOSIS — I10 Essential (primary) hypertension: Secondary | ICD-10-CM | POA: Diagnosis not present

## 2020-01-24 DIAGNOSIS — R7301 Impaired fasting glucose: Secondary | ICD-10-CM | POA: Diagnosis not present

## 2020-01-24 DIAGNOSIS — E782 Mixed hyperlipidemia: Secondary | ICD-10-CM | POA: Diagnosis not present

## 2020-01-24 DIAGNOSIS — M1712 Unilateral primary osteoarthritis, left knee: Secondary | ICD-10-CM | POA: Diagnosis not present

## 2020-01-24 DIAGNOSIS — M1711 Unilateral primary osteoarthritis, right knee: Secondary | ICD-10-CM | POA: Diagnosis not present

## 2020-02-21 DIAGNOSIS — R7301 Impaired fasting glucose: Secondary | ICD-10-CM | POA: Diagnosis not present

## 2020-02-21 DIAGNOSIS — M1711 Unilateral primary osteoarthritis, right knee: Secondary | ICD-10-CM | POA: Diagnosis not present

## 2020-02-21 DIAGNOSIS — E782 Mixed hyperlipidemia: Secondary | ICD-10-CM | POA: Diagnosis not present

## 2020-02-21 DIAGNOSIS — I10 Essential (primary) hypertension: Secondary | ICD-10-CM | POA: Diagnosis not present

## 2020-02-21 DIAGNOSIS — M1712 Unilateral primary osteoarthritis, left knee: Secondary | ICD-10-CM | POA: Diagnosis not present

## 2020-03-25 ENCOUNTER — Ambulatory Visit: Payer: Medicare Other | Admitting: Orthopaedic Surgery

## 2020-03-25 ENCOUNTER — Other Ambulatory Visit: Payer: Self-pay

## 2020-03-25 ENCOUNTER — Encounter: Payer: Self-pay | Admitting: Orthopaedic Surgery

## 2020-03-25 DIAGNOSIS — Z96652 Presence of left artificial knee joint: Secondary | ICD-10-CM

## 2020-03-25 DIAGNOSIS — Z96651 Presence of right artificial knee joint: Secondary | ICD-10-CM

## 2020-03-25 MED ORDER — METHYLPREDNISOLONE 4 MG PO TABS: ORAL_TABLET | ORAL | 0 refills | Status: DC

## 2020-03-25 NOTE — Progress Notes (Signed)
The patient is status post a left total knee arthroplasty that we did about 6 months ago.  We have already replaced her right knee prior to that.  She is having no issues with her knees.  She does have chronic lumbar spine issues having had at least 4 operations on her back.  She is right now she is having some issues with her spine.  She is not a diabetic.  She is on chronic oxycodone.  She is walking without an assistive device.  Examination both knees show that her incisions of healed nicely.  Both knees have good range of motion and are ligamentously stable.  At this point I do not need to see her back for 6 months.  At that visit I would like an AP and lateral both knees.  We will at least send in a Medrol Dosepak to see if this helps with her acute inflammation and pain.  All questions and concerns were answered and addressed.

## 2020-04-19 DIAGNOSIS — D509 Iron deficiency anemia, unspecified: Secondary | ICD-10-CM | POA: Diagnosis not present

## 2020-04-19 DIAGNOSIS — M48062 Spinal stenosis, lumbar region with neurogenic claudication: Secondary | ICD-10-CM | POA: Diagnosis not present

## 2020-04-19 DIAGNOSIS — I10 Essential (primary) hypertension: Secondary | ICD-10-CM | POA: Diagnosis not present

## 2020-04-19 DIAGNOSIS — E785 Hyperlipidemia, unspecified: Secondary | ICD-10-CM | POA: Diagnosis not present

## 2020-05-21 DIAGNOSIS — D509 Iron deficiency anemia, unspecified: Secondary | ICD-10-CM | POA: Diagnosis not present

## 2020-05-21 DIAGNOSIS — E782 Mixed hyperlipidemia: Secondary | ICD-10-CM | POA: Diagnosis not present

## 2020-05-21 DIAGNOSIS — I10 Essential (primary) hypertension: Secondary | ICD-10-CM | POA: Diagnosis not present

## 2020-05-21 DIAGNOSIS — M170 Bilateral primary osteoarthritis of knee: Secondary | ICD-10-CM | POA: Diagnosis not present

## 2020-05-21 DIAGNOSIS — M1711 Unilateral primary osteoarthritis, right knee: Secondary | ICD-10-CM | POA: Diagnosis not present

## 2020-05-30 DIAGNOSIS — R7301 Impaired fasting glucose: Secondary | ICD-10-CM | POA: Diagnosis not present

## 2020-05-30 DIAGNOSIS — M792 Neuralgia and neuritis, unspecified: Secondary | ICD-10-CM | POA: Diagnosis not present

## 2020-05-30 DIAGNOSIS — F5101 Primary insomnia: Secondary | ICD-10-CM | POA: Diagnosis not present

## 2020-05-30 DIAGNOSIS — H269 Unspecified cataract: Secondary | ICD-10-CM | POA: Diagnosis not present

## 2020-06-03 DIAGNOSIS — I10 Essential (primary) hypertension: Secondary | ICD-10-CM | POA: Diagnosis not present

## 2020-06-03 DIAGNOSIS — D509 Iron deficiency anemia, unspecified: Secondary | ICD-10-CM | POA: Diagnosis not present

## 2020-06-03 DIAGNOSIS — E782 Mixed hyperlipidemia: Secondary | ICD-10-CM | POA: Diagnosis not present

## 2020-06-03 DIAGNOSIS — Z0001 Encounter for general adult medical examination with abnormal findings: Secondary | ICD-10-CM | POA: Diagnosis not present

## 2020-06-03 DIAGNOSIS — R7301 Impaired fasting glucose: Secondary | ICD-10-CM | POA: Diagnosis not present

## 2020-06-27 DIAGNOSIS — E782 Mixed hyperlipidemia: Secondary | ICD-10-CM | POA: Diagnosis not present

## 2020-06-27 DIAGNOSIS — I10 Essential (primary) hypertension: Secondary | ICD-10-CM | POA: Diagnosis not present

## 2020-06-27 DIAGNOSIS — M170 Bilateral primary osteoarthritis of knee: Secondary | ICD-10-CM | POA: Diagnosis not present

## 2020-06-27 DIAGNOSIS — R7301 Impaired fasting glucose: Secondary | ICD-10-CM | POA: Diagnosis not present

## 2020-06-27 DIAGNOSIS — D509 Iron deficiency anemia, unspecified: Secondary | ICD-10-CM | POA: Diagnosis not present

## 2020-07-26 ENCOUNTER — Other Ambulatory Visit: Payer: Self-pay | Admitting: Internal Medicine

## 2020-07-26 DIAGNOSIS — Z1231 Encounter for screening mammogram for malignant neoplasm of breast: Secondary | ICD-10-CM

## 2020-07-31 DIAGNOSIS — D509 Iron deficiency anemia, unspecified: Secondary | ICD-10-CM | POA: Diagnosis not present

## 2020-07-31 DIAGNOSIS — I10 Essential (primary) hypertension: Secondary | ICD-10-CM | POA: Diagnosis not present

## 2020-07-31 DIAGNOSIS — R7301 Impaired fasting glucose: Secondary | ICD-10-CM | POA: Diagnosis not present

## 2020-07-31 DIAGNOSIS — M170 Bilateral primary osteoarthritis of knee: Secondary | ICD-10-CM | POA: Diagnosis not present

## 2020-07-31 DIAGNOSIS — E782 Mixed hyperlipidemia: Secondary | ICD-10-CM | POA: Diagnosis not present

## 2020-08-08 ENCOUNTER — Other Ambulatory Visit: Payer: Self-pay

## 2020-08-08 ENCOUNTER — Ambulatory Visit: Payer: Medicare Other

## 2020-08-08 ENCOUNTER — Ambulatory Visit
Admission: RE | Admit: 2020-08-08 | Discharge: 2020-08-08 | Disposition: A | Discharge status: Home / Self Care | Payer: Medicare Other | Source: Ambulatory Visit | Attending: Internal Medicine | Admitting: Internal Medicine

## 2020-08-08 DIAGNOSIS — Z1231 Encounter for screening mammogram for malignant neoplasm of breast: Secondary | ICD-10-CM | POA: Diagnosis not present

## 2020-09-03 DIAGNOSIS — M170 Bilateral primary osteoarthritis of knee: Secondary | ICD-10-CM | POA: Diagnosis not present

## 2020-09-03 DIAGNOSIS — D509 Iron deficiency anemia, unspecified: Secondary | ICD-10-CM | POA: Diagnosis not present

## 2020-09-03 DIAGNOSIS — M1711 Unilateral primary osteoarthritis, right knee: Secondary | ICD-10-CM | POA: Diagnosis not present

## 2020-09-03 DIAGNOSIS — E782 Mixed hyperlipidemia: Secondary | ICD-10-CM | POA: Diagnosis not present

## 2020-09-03 DIAGNOSIS — I10 Essential (primary) hypertension: Secondary | ICD-10-CM | POA: Diagnosis not present

## 2020-09-09 DIAGNOSIS — R7301 Impaired fasting glucose: Secondary | ICD-10-CM | POA: Diagnosis not present

## 2020-09-09 DIAGNOSIS — H269 Unspecified cataract: Secondary | ICD-10-CM | POA: Diagnosis not present

## 2020-09-09 DIAGNOSIS — F5101 Primary insomnia: Secondary | ICD-10-CM | POA: Diagnosis not present

## 2020-09-09 DIAGNOSIS — M792 Neuralgia and neuritis, unspecified: Secondary | ICD-10-CM | POA: Diagnosis not present

## 2020-09-11 DIAGNOSIS — M48062 Spinal stenosis, lumbar region with neurogenic claudication: Secondary | ICD-10-CM | POA: Diagnosis not present

## 2020-09-11 DIAGNOSIS — E782 Mixed hyperlipidemia: Secondary | ICD-10-CM | POA: Diagnosis not present

## 2020-09-11 DIAGNOSIS — R7301 Impaired fasting glucose: Secondary | ICD-10-CM | POA: Diagnosis not present

## 2020-09-11 DIAGNOSIS — I10 Essential (primary) hypertension: Secondary | ICD-10-CM | POA: Diagnosis not present

## 2020-09-11 DIAGNOSIS — D509 Iron deficiency anemia, unspecified: Secondary | ICD-10-CM | POA: Diagnosis not present

## 2020-09-24 DIAGNOSIS — E782 Mixed hyperlipidemia: Secondary | ICD-10-CM | POA: Diagnosis not present

## 2020-09-24 DIAGNOSIS — M48062 Spinal stenosis, lumbar region with neurogenic claudication: Secondary | ICD-10-CM | POA: Diagnosis not present

## 2020-09-24 DIAGNOSIS — I10 Essential (primary) hypertension: Secondary | ICD-10-CM | POA: Diagnosis not present

## 2020-09-24 DIAGNOSIS — D509 Iron deficiency anemia, unspecified: Secondary | ICD-10-CM | POA: Diagnosis not present

## 2020-09-25 ENCOUNTER — Ambulatory Visit: Payer: Medicare Other | Admitting: Orthopaedic Surgery

## 2020-10-16 ENCOUNTER — Telehealth: Payer: Self-pay | Admitting: *Deleted

## 2020-10-16 NOTE — Telephone Encounter (Signed)
Ortho bundle 1 year call completed. ?

## 2020-11-15 DIAGNOSIS — D509 Iron deficiency anemia, unspecified: Secondary | ICD-10-CM | POA: Diagnosis not present

## 2020-11-15 DIAGNOSIS — I10 Essential (primary) hypertension: Secondary | ICD-10-CM | POA: Diagnosis not present

## 2020-11-15 DIAGNOSIS — M48062 Spinal stenosis, lumbar region with neurogenic claudication: Secondary | ICD-10-CM | POA: Diagnosis not present

## 2020-11-15 DIAGNOSIS — E782 Mixed hyperlipidemia: Secondary | ICD-10-CM | POA: Diagnosis not present

## 2020-11-20 ENCOUNTER — Telehealth: Payer: Self-pay

## 2020-11-20 NOTE — Telephone Encounter (Signed)
Refill request of oxycodone [facility]

## 2020-11-20 NOTE — Telephone Encounter (Signed)
I have actually not seen the patient since May of last year.  She was on chronic oxycodone I believe potentially from someone else.  This is something I cannot prescribe since I have not seen her in well over 7 months.  I am not sure who is providing her chronic oxycodone before.

## 2020-12-14 DIAGNOSIS — E782 Mixed hyperlipidemia: Secondary | ICD-10-CM | POA: Diagnosis not present

## 2020-12-14 DIAGNOSIS — Z96652 Presence of left artificial knee joint: Secondary | ICD-10-CM | POA: Diagnosis not present

## 2020-12-14 DIAGNOSIS — Z8739 Personal history of other diseases of the musculoskeletal system and connective tissue: Secondary | ICD-10-CM | POA: Diagnosis not present

## 2020-12-14 DIAGNOSIS — R944 Abnormal results of kidney function studies: Secondary | ICD-10-CM | POA: Diagnosis not present

## 2020-12-14 DIAGNOSIS — I10 Essential (primary) hypertension: Secondary | ICD-10-CM | POA: Diagnosis not present

## 2020-12-14 DIAGNOSIS — M1711 Unilateral primary osteoarthritis, right knee: Secondary | ICD-10-CM | POA: Diagnosis not present

## 2020-12-14 DIAGNOSIS — M48062 Spinal stenosis, lumbar region with neurogenic claudication: Secondary | ICD-10-CM | POA: Diagnosis not present

## 2020-12-14 DIAGNOSIS — M1712 Unilateral primary osteoarthritis, left knee: Secondary | ICD-10-CM | POA: Diagnosis not present

## 2020-12-14 DIAGNOSIS — Z96651 Presence of right artificial knee joint: Secondary | ICD-10-CM | POA: Diagnosis not present

## 2020-12-14 DIAGNOSIS — D509 Iron deficiency anemia, unspecified: Secondary | ICD-10-CM | POA: Diagnosis not present

## 2020-12-17 DIAGNOSIS — J069 Acute upper respiratory infection, unspecified: Secondary | ICD-10-CM | POA: Diagnosis not present

## 2021-01-07 ENCOUNTER — Encounter: Payer: Self-pay | Admitting: Orthopaedic Surgery

## 2021-01-07 ENCOUNTER — Ambulatory Visit: Payer: Self-pay

## 2021-01-07 ENCOUNTER — Ambulatory Visit: Payer: Medicare Other | Admitting: Orthopaedic Surgery

## 2021-01-07 DIAGNOSIS — M7061 Trochanteric bursitis, right hip: Secondary | ICD-10-CM

## 2021-01-07 MED ORDER — METHYLPREDNISOLONE ACETATE 40 MG/ML IJ SUSP
40.0000 mg | INTRAMUSCULAR | Status: AC | PRN
  Administered 2021-01-07: 40 mg via INTRA_ARTICULAR

## 2021-01-07 MED ORDER — LIDOCAINE HCL 1 % IJ SOLN
3.0000 mL | INTRAMUSCULAR | Status: AC | PRN
  Administered 2021-01-07: 3 mL

## 2021-01-07 NOTE — Progress Notes (Signed)
Office Visit Note   Patient: [name]           Date of Birth: 06-05-1946           MRN: [id number] Visit Date: 01/07/2021              Requested by: Celene Squibb, MD [address] [place],  [place] [address] PCP: Celene Squibb, MD   Assessment & Plan: Visit Diagnoses:  1. Trochanteric bursitis, right hip     Plan: She shown IT band stretching exercises. She will let us know if the pain continues or if the shot does not hold for. She may benefit from an intra-articular injection of the right hip. Follow-up as needed. Questions encouraged and answered  Follow-Up Instructions: Return if symptoms worsen or fail to improve.   Orders:  Orders Placed This Encounter  Procedures  . XR HIP UNILAT W OR W/O PELVIS 2-3 VIEWS RIGHT   No orders of the defined types were placed in this encounter.     Procedures: Large Joint Inj: R greater trochanter on 01/07/2021 9:11 AM Indications: pain Details: 22 G 1.5 in needle, lateral approach  Arthrogram: No  Medications: 3 mL lidocaine 1 %; 40 mg methylPREDNISolone acetate 40 MG/ML Outcome: tolerated well, no immediate complications Procedure, treatment alternatives, risks and benefits explained, specific risks discussed. Consent was given by the patient. Immediately prior to procedure a time out was called to verify the correct patient, procedure, equipment, support staff and site/side marked as required. Patient was prepped and draped in the usual sterile fashion.       Clinical Data: No additional findings.   Subjective: Chief Complaint  Patient presents with  . Right Leg - Pain    HPI [name] is well-known to Dr. Ninfa Linden service comes in today due to right anterior thigh pain that is been ongoing since November. No known injury. She is having no real groin pain. She does have a history of lumbar back surgery and has numbness tingling in her legs but this is chronic. 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 She has had no new injury.  Review of Systems She denies any fevers chills or ongoing infections.  Objective: Vital Signs: LMP  (LMP Unknown)   Physical Exam Constitutional:      Appearance: She is not ill-appearing or diaphoretic.  Pulmonary:     Effort: Pulmonary effort is normal.  Neurological:     Mental Status: She is alert and oriented to person, place, and time.  Psychiatric:        Mood and Affect: Mood normal.     Ortho Exam Bilateral knee wounds good range of motion of both knees without pain.  No instability valgus varus stressing. Left hip excellent range of motion without pain.  Minimal tenderness over the left trochanteric region.  Right hip guarding and discomfort with attempts of internal or external rotation.  Tenderness over the right trochanteric region.  Patient ambulates without any assistive device with an antalgic gait on the right. Post right hip injection: Internal and external rotation no longer cause pain in the right thigh and hip. She walks with a near normal gait.  Specialty Comments:  No specialty comments available.  Imaging: XR HIP UNILAT W OR W/O PELVIS 2-3 VIEWS RIGHT  Result Date: 01/07/2021 [facility] pelvis and lateral views of the right hip: Bilateral hips well located. Right hip with periarticular spurring at the weightbearing  hip joint is well-preserved. No acute fractures no bony abnormalities. Hardware present in the lower lumbar region.    PMFS History: Patient Active Problem List   Diagnosis Date Noted  . Status post total left knee replacement 10/17/2019  . Status post total right knee replacement 05/30/2019  . Unilateral primary osteoarthritis, left knee 04/18/2019  . Unilateral primary osteoarthritis, right knee 04/18/2019  . CVA (cerebral vascular accident) ([facility]) 08/04/2018  . Stroke ([facility]) 08/04/2018  . Chronic back pain 08/04/2018  . Post-operative pain 09/20/2017  . Postprocedural pseudomeningocele  09/20/2017  . Lumbar stenosis with neurogenic claudication 02/04/2015  . Stiffness of joints, not elsewhere classified, multiple sites 10/27/2012  . Weakness of both legs 10/27/2012  . Difficulty in walking(719.7) 10/27/2012   Past Medical History:  Diagnosis Date  . Arthritis   . Cataracts, bilateral    surgery to remove cataracts - bilateral  . Chronic back pain   . Hypertension   . Stroke ([facility])   . Wears dentures     Family History  Problem Relation Age of Onset  . Hypertension Mother   . Breast cancer Neg Hx   . Stroke Neg Hx     Past Surgical History:  Procedure Laterality Date  . ABDOMINAL HYSTERECTOMY  82  . APPENDECTOMY    . BACK SURGERY  2010  . BACK SURGERY  2015  . BACK SURGERY  06/2016  . CATARACT EXTRACTION W/PHACO Left 01/07/2017   Procedure: CATARACT EXTRACTION PHACO AND INTRAOCULAR LENS PLACEMENT (IOC);  Surgeon: Tonny Branch, MD;  Location: [facility];  Service: Ophthalmology;  Laterality: Left;  left CDE 9.31   . CATARACT EXTRACTION W/PHACO Right 01/25/2017   Procedure: CATARACT EXTRACTION PHACO AND INTRAOCULAR LENS PLACEMENT ([facility]) CDE:16.07;  Surgeon: Tonny Branch, MD;  Location: [facility];  Service: Ophthalmology;  Laterality: Right;  right  . FRACTURE SURGERY Right    arm  1974  . LUMBAR WOUND DEBRIDEMENT N/A 09/23/2017   Procedure: Exploration of Lumbar Wound; Repair of Pseudomeningocele;  Surgeon: Newman Pies, MD;  Location: [facility];  Service: Neurosurgery;  Laterality: N/A;  . TONSILLECTOMY    . TOTAL KNEE ARTHROPLASTY Right 05/30/2019   Procedure: RIGHT TOTAL KNEE ARTHROPLASTY;  Surgeon: Mcarthur Rossetti, MD;  Location: [facility];  Service: Orthopedics;  Laterality: Right;  . TOTAL KNEE ARTHROPLASTY Left 10/17/2019  . TOTAL KNEE ARTHROPLASTY Left 10/17/2019   Procedure: LEFT TOTAL KNEE ARTHROPLASTY;  Surgeon: Mcarthur Rossetti, MD;  Location: [facility];  Service: Orthopedics;  Laterality: Left;   Social History   Occupational History  . Not on file   Tobacco Use  . Smoking status: Never Smoker  . Smokeless tobacco: Never Used  Vaping Use  . Vaping Use: Never used  Substance and Sexual Activity  . Alcohol use: No  . Drug use: No  . Sexual activity: Not Currently    Birth control/protection: Post-menopausal

## 2021-01-13 DIAGNOSIS — Z96652 Presence of left artificial knee joint: Secondary | ICD-10-CM | POA: Diagnosis not present

## 2021-01-13 DIAGNOSIS — M1712 Unilateral primary osteoarthritis, left knee: Secondary | ICD-10-CM | POA: Diagnosis not present

## 2021-01-13 DIAGNOSIS — Z8739 Personal history of other diseases of the musculoskeletal system and connective tissue: Secondary | ICD-10-CM | POA: Diagnosis not present

## 2021-01-13 DIAGNOSIS — Z2821 Immunization not carried out because of patient refusal: Secondary | ICD-10-CM | POA: Diagnosis not present

## 2021-01-13 DIAGNOSIS — Z96651 Presence of right artificial knee joint: Secondary | ICD-10-CM | POA: Diagnosis not present

## 2021-01-13 DIAGNOSIS — E782 Mixed hyperlipidemia: Secondary | ICD-10-CM | POA: Diagnosis not present

## 2021-01-13 DIAGNOSIS — D509 Iron deficiency anemia, unspecified: Secondary | ICD-10-CM | POA: Diagnosis not present

## 2021-01-13 DIAGNOSIS — R944 Abnormal results of kidney function studies: Secondary | ICD-10-CM | POA: Diagnosis not present

## 2021-01-13 DIAGNOSIS — I10 Essential (primary) hypertension: Secondary | ICD-10-CM | POA: Diagnosis not present

## 2021-01-13 DIAGNOSIS — M1711 Unilateral primary osteoarthritis, right knee: Secondary | ICD-10-CM | POA: Diagnosis not present

## 2021-02-04 DIAGNOSIS — K922 Gastrointestinal hemorrhage, unspecified: Secondary | ICD-10-CM | POA: Diagnosis not present

## 2021-02-04 DIAGNOSIS — R7301 Impaired fasting glucose: Secondary | ICD-10-CM | POA: Diagnosis not present

## 2021-02-04 DIAGNOSIS — I10 Essential (primary) hypertension: Secondary | ICD-10-CM | POA: Diagnosis not present

## 2021-02-04 DIAGNOSIS — H269 Unspecified cataract: Secondary | ICD-10-CM | POA: Diagnosis not present

## 2021-02-04 DIAGNOSIS — M792 Neuralgia and neuritis, unspecified: Secondary | ICD-10-CM | POA: Diagnosis not present

## 2021-02-04 DIAGNOSIS — F5101 Primary insomnia: Secondary | ICD-10-CM | POA: Diagnosis not present

## 2021-02-04 DIAGNOSIS — K921 Melena: Secondary | ICD-10-CM | POA: Diagnosis not present

## 2021-02-04 DIAGNOSIS — D509 Iron deficiency anemia, unspecified: Secondary | ICD-10-CM | POA: Diagnosis not present

## 2021-02-05 ENCOUNTER — Encounter (INDEPENDENT_AMBULATORY_CARE_PROVIDER_SITE_OTHER): Payer: Self-pay | Admitting: *Deleted

## 2021-02-12 DIAGNOSIS — Z2821 Immunization not carried out because of patient refusal: Secondary | ICD-10-CM | POA: Diagnosis not present

## 2021-02-12 DIAGNOSIS — E782 Mixed hyperlipidemia: Secondary | ICD-10-CM | POA: Diagnosis not present

## 2021-02-12 DIAGNOSIS — I10 Essential (primary) hypertension: Secondary | ICD-10-CM | POA: Diagnosis not present

## 2021-02-12 DIAGNOSIS — M48062 Spinal stenosis, lumbar region with neurogenic claudication: Secondary | ICD-10-CM | POA: Diagnosis not present

## 2021-02-12 DIAGNOSIS — R944 Abnormal results of kidney function studies: Secondary | ICD-10-CM | POA: Diagnosis not present

## 2021-02-12 DIAGNOSIS — M1712 Unilateral primary osteoarthritis, left knee: Secondary | ICD-10-CM | POA: Diagnosis not present

## 2021-02-12 DIAGNOSIS — Z8739 Personal history of other diseases of the musculoskeletal system and connective tissue: Secondary | ICD-10-CM | POA: Diagnosis not present

## 2021-02-12 DIAGNOSIS — Z96652 Presence of left artificial knee joint: Secondary | ICD-10-CM | POA: Diagnosis not present

## 2021-02-14 ENCOUNTER — Telehealth: Payer: Self-pay

## 2021-02-14 NOTE — Telephone Encounter (Signed)
Looks like she is scheduled at Dr. Otelia Limes office

## 2021-02-14 NOTE — Telephone Encounter (Signed)
FYI Pt called to see if our office received a referral from Dr. Durene Cal office. Pt is going to check with Dr. Nevada Crane.

## 2021-02-14 NOTE — Telephone Encounter (Signed)
Noted  

## 2021-02-22 NOTE — H&P (View-Only) (Signed)
Referring Provider: Celene Squibb, MD Primary Care Physician:  Celene Squibb, MD Primary Gastroenterologist:  Dr. Gala Romney  Chief Complaint  Patient presents with  . change in bowels    Was having black tar stools 4 days end of march. None since No straining with BM, no bleeding    HPI:   [name] is a 75 y.o. female presenting today at the request of Celene Squibb, MD for dark stools.  EGD and colonoscopy in June 2009 for right flank pain with circumferential distal esophageal erosions consistent with mild erosive reflux esophagitis, small hiatal hernia, antral erosions, areas of scar versus adenomatous changes s/p biopsy (reactive gastropathy, negative H. pylori), normal examined duodenum.  Colonoscopy with anal papilla and internal hemorrhoids, 5 mm polyp and diminutive polyp removed.  Pathology with 1 tubular adenoma and benign colonic mucosa with focal surface erosion.  Recommended repeat colonoscopy in 5 years. We last saw patient in August 2009. She was doing well with a friends Nexium as Kapidex caused a rash. She was prescribed omeprazole 20 mg daily.   Reviewed information included in referral.  Office visit with PCP 02/04/2021.  Patient reported black tarry stools x3 days and lower abdominal cramping.  Denied taking iron or Pepto-Bismol.  Denied NSAIDs.  She is started on Protonix 40 mg twice daily.  She reported her mother had died with stomach cancer.  Labs completed 02/04/2021.  Hemoglobin 10.9 (L).  Normocytic indices.  Ferritin 38, iron 52, iron saturation 15% (lower end of normal)  Today:  Black, tarry stools in late March x 4 days. None since then. BMs daily, tan in color. No constipation or diarrhea. No brbpr. Had been taking 81mg  Aspirin daily due to history of stroke, but this is on hold. Hasn't been on anything for GERD for years. No GERD symptoms or abdominal pain. No dysaphia, nausea, or vomiting. She took Pantoprazole 40 mg BID for about 5 days after visit with PCP.   No  unintentional weight loss.   Chronic back pain.  Legs go to sleep when laying on her side.  Sleeps in a recliner.  Past Medical History:  Diagnosis Date  . Arthritis   . Cataracts, bilateral    surgery to remove cataracts - bilateral  . Chronic back pain   . Hypertension   . Stroke ([facility])   . Wears dentures     Past Surgical History:  Procedure Laterality Date  . ABDOMINAL HYSTERECTOMY  82  . APPENDECTOMY    . BACK SURGERY  2010  . BACK SURGERY  2015  . BACK SURGERY  06/2016  . CATARACT EXTRACTION W/PHACO Left 01/07/2017   Procedure: CATARACT EXTRACTION PHACO AND INTRAOCULAR LENS PLACEMENT (IOC);  Surgeon: Tonny Branch, MD;  Location: [facility];  Service: Ophthalmology;  Laterality: Left;  left CDE 9.31   . CATARACT EXTRACTION W/PHACO Right 01/25/2017   Procedure: CATARACT EXTRACTION PHACO AND INTRAOCULAR LENS PLACEMENT ([facility]) CDE:16.07;  Surgeon: Tonny Branch, MD;  Location: [facility];  Service: Ophthalmology;  Laterality: Right;  right  . COLONOSCOPY  04/2008   Surgeon: Dr. Gala Romney; anal papilla, internal hemorrhoids, 5 mm polyp and diminutive polyp removed.  Pathology with 1 tubular adenoma and benign colonic mucosa with focal surface erosion.  Recommend repeat colonoscopy in 5 years.  . ESOPHAGOGASTRODUODENOSCOPY  04/2008   Surgeon: Dr. Gala Romney; circumferential distal esophageal erosions consistent with nonerosive reflux esophagitis, small hiatal hernia, antral erosions, area of scar versus adenomatous change s/p biopsy (reactive gastropathy, negative H.  pylori), normal examined duodenum.  . FRACTURE SURGERY Right    arm  1974  . LUMBAR WOUND DEBRIDEMENT N/A 09/23/2017   Procedure: Exploration of Lumbar Wound; Repair of Pseudomeningocele;  Surgeon: Newman Pies, MD;  Location: [facility];  Service: Neurosurgery;  Laterality: N/A;  . TONSILLECTOMY    . TOTAL KNEE ARTHROPLASTY Right 05/30/2019   Procedure: RIGHT TOTAL KNEE ARTHROPLASTY;  Surgeon: Mcarthur Rossetti, MD;  Location: [facility];   Service: Orthopedics;  Laterality: Right;  . TOTAL KNEE ARTHROPLASTY Left 10/17/2019  . TOTAL KNEE ARTHROPLASTY Left 10/17/2019   Procedure: LEFT TOTAL KNEE ARTHROPLASTY;  Surgeon: Mcarthur Rossetti, MD;  Location: [facility];  Service: Orthopedics;  Laterality: Left;    Current Outpatient Medications  Medication Sig Dispense Refill  . atenolol (TENORMIN) 25 MG tablet Take 25 mg by mouth daily.    Marland Kitchen oxyCODONE-acetaminophen (PERCOCET/ROXICET) 5-325 MG tablet TAKE 1 TO 2 TABLETS BY MOUTH EVERY 6 HOURS AS NEEDED FOR SEVERE pain (Patient taking differently: 1/2 tablet twice a day) 40 tablet 0  . pantoprazole (PROTONIX) 40 MG tablet Take 1 tablet (40 mg total) by mouth 2 (two) times daily before a meal. 60 tablet 3  . rosuvastatin (CRESTOR) 5 MG tablet Take 5 mg by mouth daily.    Marland Kitchen aspirin EC 325 MG EC tablet Take 1 tablet (325 mg total) by mouth 2 (two) times daily after a meal. (Patient not taking: Reported on 02/24/2021) 30 tablet 0   No current facility-administered medications for this visit.    Allergies as of 02/24/2021 - Review Complete 02/24/2021  Allergen Reaction Noted  . Flexeril [cyclobenzaprine] Nausea Only 09/24/2017  . Hydrocodone Nausea Only 08/03/2017  . Dexlansoprazole Rash 02/22/2021  . Lyrica [pregabalin] Palpitations 08/03/2017  . Neurontin [gabapentin] Palpitations 12/28/2016    Family History  Problem Relation Age of Onset  . Hypertension Mother   . Stomach cancer Mother 54  . Stomach cancer Maternal Aunt        late 48s  . Breast cancer Neg Hx   . Stroke Neg Hx   . Colon cancer Neg Hx     Social History   Socioeconomic History  . Marital status: Married    Spouse name: Not on file  . Number of children: Not on file  . Years of education: Not on file  . Highest education level: Not on file  Occupational History  . Not on file  Tobacco Use  . Smoking status: Never Smoker  . Smokeless tobacco: Never Used  Vaping Use  . Vaping Use: Never used   Substance and Sexual Activity  . Alcohol use: No  . Drug use: No  . Sexual activity: Not Currently    Birth control/protection: Post-menopausal  Other Topics Concern  . Not on file  Social History Narrative  . Not on file   Social Determinants of Health   Financial Resource Strain: Not on file  Food Insecurity: Not on file  Transportation Needs: Not on file  Physical Activity: Not on file  Stress: Not on file  Social Connections: Not on file  Intimate Partner Violence: Not on file    Review of Systems: Gen: Denies any fever, chills, cold or flulike symptoms, weakness, fatigue, lightheadedness, dizziness, presyncope, syncope. CV: Denies chest pain or palpitations. Resp: Denies shortness of breath or cough. GI: See HPI Heme: See HPI  Physical Exam: BP (!) 148/79   Pulse 67   Temp 97.7 F (36.5 C)   Ht 5\' 4"  (1.626 m)  Wt 223 lb (101.2 kg)   LMP  (LMP Unknown)   BMI 38.28 kg/m  General:   Alert and oriented. Pleasant and cooperative. Well-nourished and well-developed.  Head:  Normocephalic and atraumatic. Eyes:  Without icterus, sclera clear and conjunctiva pink.  Ears:  Normal auditory acuity. Lungs:  Clear to auscultation bilaterally. No wheezes, rales, or rhonchi. No distress.  Heart:  S1, S2 present without murmurs appreciated.  Abdomen:  +BS, soft, and non-distended.  Minimal TTP in the epigastric area.  No HSM noted. No guarding or rebound. No masses appreciated.  Rectal:  Deferred  Msk:  Symmetrical without gross deformities. Normal posture. Extremities:  Without edema. Neurologic:  Alert and  oriented x4;  grossly normal neurologically. Skin:  Intact without significant lesions or rashes. Psych:  Normal mood and affect.   Assessment: 75 year old female with GI history of erosive reflux esophagitis, hiatal hernia, adenomatous colon polyps with last colonoscopy and EGD in 2009 presenting today for further evaluation of melena.  She reports 4 days of  completely black, tarry bowel movements in late March.  She had not been on a PPI in years and takes daily 81 mg aspirin due to history of stroke.  Aspirin is currently on hold.  Denies iron or Pepto-Bismol.  No other significant upper or lower GI symptoms. Labs completed 02/04/2021 with hemoglobin 10.9 (L), normocytic indices.  Ferritin 38, iron 52, iron saturation 15% (lower end of normal). After seeing her PCP on 3/22, she was started on PPI BID which she took x 5 days. Denies any recurrent melena.  On exam, she has very mild TTP in the epigastric area.  Family history significant for mother and maternal aunt with history of stomach cancer.   Query PUD in the setting of chronic NSAID use.  May also have esophagitis, gastritis, duodenitis.  Additionally, cannot rule out right-sided colonic lesion or malignancy.   Patient needs EGD and colonoscopy for further evaluation of melena as well as history of adenomatous colon polyps.  She prefers to have both procedures completed together.  We discussed the fact that she would likely not be scheduled for a double for couple of months due to our schedules being full. She would like to proceed with EGD and colonoscopy together.   Plan: 1.  Resume Protonix 40 mg twice daily 30 minutes before breakfast and dinner.  2.  Proceed with EGD +  colonoscopy with propofol in the near future with Dr. Gala Romney. The risks, benefits, and alternatives have been discussed with the patient in detail. The patient states understanding and desires to proceed.  ASA III We will place her on the cancellation list and hopes to move forward with procedures ASAP.  3.  Advised to monitor for return of black stools and let us know immediately if this occurs.  4.  Follow-up after procedures.    Aliene Altes, PA-C [facility] 02/24/2021

## 2021-02-22 NOTE — Progress Notes (Signed)
Referring Provider: Celene Squibb, MD Primary Care Physician:  Celene Squibb, MD Primary Gastroenterologist:  Dr. Gala Romney  Chief Complaint  Patient presents with  . change in bowels    Was having black tar stools 4 days end of march. None since No straining with BM, no bleeding    HPI:   [name] is a 75 y.o. female presenting today at the request of Celene Squibb, MD for dark stools.  EGD and colonoscopy in June 2009 for right flank pain with circumferential distal esophageal erosions consistent with mild erosive reflux esophagitis, small hiatal hernia, antral erosions, areas of scar versus adenomatous changes s/p biopsy (reactive gastropathy, negative H. pylori), normal examined duodenum.  Colonoscopy with anal papilla and internal hemorrhoids, 5 mm polyp and diminutive polyp removed.  Pathology with 1 tubular adenoma and benign colonic mucosa with focal surface erosion.  Recommended repeat colonoscopy in 5 years. We last saw patient in August 2009. She was doing well with a friends Nexium as Kapidex caused a rash. She was prescribed omeprazole 20 mg daily.   Reviewed information included in referral.  Office visit with PCP 02/04/2021.  Patient reported black tarry stools x3 days and lower abdominal cramping.  Denied taking iron or Pepto-Bismol.  Denied NSAIDs.  She is started on Protonix 40 mg twice daily.  She reported her mother had died with stomach cancer.  Labs completed 02/04/2021.  Hemoglobin 10.9 (L).  Normocytic indices.  Ferritin 38, iron 52, iron saturation 15% (lower end of normal)  Today:  Black, tarry stools in late March x 4 days. None since then. BMs daily, tan in color. No constipation or diarrhea. No brbpr. Had been taking 81mg  Aspirin daily due to history of stroke, but this is on hold. Hasn't been on anything for GERD for years. No GERD symptoms or abdominal pain. No dysaphia, nausea, or vomiting. She took Pantoprazole 40 mg BID for about 5 days after visit with PCP.   No  unintentional weight loss.   Chronic back pain.  Legs go to sleep when laying on her side.  Sleeps in a recliner.  Past Medical History:  Diagnosis Date  . Arthritis   . Cataracts, bilateral    surgery to remove cataracts - bilateral  . Chronic back pain   . Hypertension   . Stroke ([place])   . Wears dentures     Past Surgical History:  Procedure Laterality Date  . ABDOMINAL HYSTERECTOMY  82  . APPENDECTOMY    . BACK SURGERY  2010  . BACK SURGERY  2015  . BACK SURGERY  06/2016  . CATARACT EXTRACTION W/PHACO Left 01/07/2017   Procedure: CATARACT EXTRACTION PHACO AND INTRAOCULAR LENS PLACEMENT ([facility]);  Surgeon: Tonny Branch, MD;  Location: [facility];  Service: Ophthalmology;  Laterality: Left;  left CDE 9.31   . CATARACT EXTRACTION W/PHACO Right 01/25/2017   Procedure: CATARACT EXTRACTION PHACO AND INTRAOCULAR LENS PLACEMENT ([place]) CDE:16.07;  Surgeon: Tonny Branch, MD;  Location: [facility];  Service: Ophthalmology;  Laterality: Right;  right  . COLONOSCOPY  04/2008   Surgeon: Dr. Gala Romney; anal papilla, internal hemorrhoids, 5 mm polyp and diminutive polyp removed.  Pathology with 1 tubular adenoma and benign colonic mucosa with focal surface erosion.  Recommend repeat colonoscopy in 5 years.  . ESOPHAGOGASTRODUODENOSCOPY  04/2008   Surgeon: Dr. Gala Romney; circumferential distal esophageal erosions consistent with nonerosive reflux esophagitis, small hiatal hernia, antral erosions, area of scar versus adenomatous change s/p biopsy (reactive gastropathy, negative H.  pylori), normal examined duodenum.  . FRACTURE SURGERY Right    arm  1974  . LUMBAR WOUND DEBRIDEMENT N/A 09/23/2017   Procedure: Exploration of Lumbar Wound; Repair of Pseudomeningocele;  Surgeon: Newman Pies, MD;  Location: [place];  Service: Neurosurgery;  Laterality: N/A;  . TONSILLECTOMY    . TOTAL KNEE ARTHROPLASTY Right 05/30/2019   Procedure: RIGHT TOTAL KNEE ARTHROPLASTY;  Surgeon: Mcarthur Rossetti, MD;  Location: [place] [facility];   Service: Orthopedics;  Laterality: Right;  . TOTAL KNEE ARTHROPLASTY Left 10/17/2019  . TOTAL KNEE ARTHROPLASTY Left 10/17/2019   Procedure: LEFT TOTAL KNEE ARTHROPLASTY;  Surgeon: Mcarthur Rossetti, MD;  Location: [place];  Service: Orthopedics;  Laterality: Left;    Current Outpatient Medications  Medication Sig Dispense Refill  . atenolol (TENORMIN) 25 MG tablet Take 25 mg by mouth daily.    Marland Kitchen oxyCODONE-acetaminophen (PERCOCET/ROXICET) 5-325 MG tablet TAKE 1 TO 2 TABLETS BY MOUTH EVERY 6 HOURS AS NEEDED FOR SEVERE pain (Patient taking differently: 1/2 tablet twice a day) 40 tablet 0  . pantoprazole (PROTONIX) 40 MG tablet Take 1 tablet (40 mg total) by mouth 2 (two) times daily before a meal. 60 tablet 3  . rosuvastatin (CRESTOR) 5 MG tablet Take 5 mg by mouth daily.    Marland Kitchen aspirin EC 325 MG EC tablet Take 1 tablet (325 mg total) by mouth 2 (two) times daily after a meal. (Patient not taking: Reported on 02/24/2021) 30 tablet 0   No current facility-administered medications for this visit.    Allergies as of 02/24/2021 - Review Complete 02/24/2021  Allergen Reaction Noted  . Flexeril [cyclobenzaprine] Nausea Only 09/24/2017  . Hydrocodone Nausea Only 08/03/2017  . Dexlansoprazole Rash 02/22/2021  . Lyrica [pregabalin] Palpitations 08/03/2017  . Neurontin [gabapentin] Palpitations 12/28/2016    Family History  Problem Relation Age of Onset  . Hypertension Mother   . Stomach cancer Mother 68  . Stomach cancer Maternal Aunt        late 48s  . Breast cancer Neg Hx   . Stroke Neg Hx   . Colon cancer Neg Hx     Social History   Socioeconomic History  . Marital status: Married    Spouse name: Not on file  . Number of children: Not on file  . Years of education: Not on file  . Highest education level: Not on file  Occupational History  . Not on file  Tobacco Use  . Smoking status: Never Smoker  . Smokeless tobacco: Never Used  Vaping Use  . Vaping Use: Never used   Substance and Sexual Activity  . Alcohol use: No  . Drug use: No  . Sexual activity: Not Currently    Birth control/protection: Post-menopausal  Other Topics Concern  . Not on file  Social History Narrative  . Not on file   Social Determinants of Health   Financial Resource Strain: Not on file  Food Insecurity: Not on file  Transportation Needs: Not on file  Physical Activity: Not on file  Stress: Not on file  Social Connections: Not on file  Intimate Partner Violence: Not on file    Review of Systems: Gen: Denies any fever, chills, cold or flulike symptoms, weakness, fatigue, lightheadedness, dizziness, presyncope, syncope. CV: Denies chest pain or palpitations. Resp: Denies shortness of breath or cough. GI: See HPI Heme: See HPI  Physical Exam: BP (!) 148/79   Pulse 67   Temp 97.7 F (36.5 C)   Ht 5\' 4"  (1.626 m)  Wt 223 lb (101.2 kg)   LMP  (LMP Unknown)   BMI 38.28 kg/m  General:   Alert and oriented. Pleasant and cooperative. Well-nourished and well-developed.  Head:  Normocephalic and atraumatic. Eyes:  Without icterus, sclera clear and conjunctiva pink.  Ears:  Normal auditory acuity. Lungs:  Clear to auscultation bilaterally. No wheezes, rales, or rhonchi. No distress.  Heart:  S1, S2 present without murmurs appreciated.  Abdomen:  +BS, soft, and non-distended.  Minimal TTP in the epigastric area.  No HSM noted. No guarding or rebound. No masses appreciated.  Rectal:  Deferred  Msk:  Symmetrical without gross deformities. Normal posture. Extremities:  Without edema. Neurologic:  Alert and  oriented x4;  grossly normal neurologically. Skin:  Intact without significant lesions or rashes. Psych:  Normal mood and affect.   Assessment: 75 year old female with GI history of erosive reflux esophagitis, hiatal hernia, adenomatous colon polyps with last colonoscopy and EGD in 2009 presenting today for further evaluation of melena.  She reports 4 days of  completely black, tarry bowel movements in late March.  She had not been on a PPI in years and takes daily 81 mg aspirin due to history of stroke.  Aspirin is currently on hold.  Denies iron or Pepto-Bismol.  No other significant upper or lower GI symptoms. Labs completed 02/04/2021 with hemoglobin 10.9 (L), normocytic indices.  Ferritin 38, iron 52, iron saturation 15% (lower end of normal). After seeing her PCP on 3/22, she was started on PPI BID which she took x 5 days. Denies any recurrent melena.  On exam, she has very mild TTP in the epigastric area.  Family history significant for mother and maternal aunt with history of stomach cancer.   Query PUD in the setting of chronic NSAID use.  May also have esophagitis, gastritis, duodenitis.  Additionally, cannot rule out right-sided colonic lesion or malignancy.   Patient needs EGD and colonoscopy for further evaluation of melena as well as history of adenomatous colon polyps.  She prefers to have both procedures completed together.  We discussed the fact that she would likely not be scheduled for a double for couple of months due to our schedules being full. She would like to proceed with EGD and colonoscopy together.   Plan: 1.  Resume Protonix 40 mg twice daily 30 minutes before breakfast and dinner.  2.  Proceed with EGD +  colonoscopy with propofol in the near future with Dr. Gala Romney. The risks, benefits, and alternatives have been discussed with the patient in detail. The patient states understanding and desires to proceed.  ASA III We will place her on the cancellation list and hopes to move forward with procedures ASAP.  3.  Advised to monitor for return of black stools and let us know immediately if this occurs.  4.  Follow-up after procedures.    Aliene Altes, PA-C [facility] 02/24/2021

## 2021-02-24 ENCOUNTER — Encounter: Payer: Self-pay | Admitting: Gastroenterology

## 2021-02-24 ENCOUNTER — Ambulatory Visit: Payer: Medicare Other | Admitting: Gastroenterology

## 2021-02-24 ENCOUNTER — Other Ambulatory Visit: Payer: Self-pay

## 2021-02-24 VITALS — BP 148/79 | HR 67 | Temp 97.7°F | Ht 64.0 in | Wt 223.0 lb

## 2021-02-24 DIAGNOSIS — K921 Melena: Secondary | ICD-10-CM | POA: Diagnosis not present

## 2021-02-24 DIAGNOSIS — Z86010 Personal history of colonic polyps: Secondary | ICD-10-CM | POA: Insufficient documentation

## 2021-02-24 MED ORDER — PANTOPRAZOLE SODIUM 40 MG PO TBEC: 40.0000 mg | DELAYED_RELEASE_TABLET | Freq: Two times a day (BID) | ORAL | 3 refills | Status: DC

## 2021-02-24 NOTE — Patient Instructions (Signed)
Resume Protonix 40 mg twice daily 30 minutes before breakfast and dinner.  I have sent a new prescription to your pharmacy.  We will arrange for you to have an upper endoscopy and colonoscopy in the near future with Dr. Gala Romney. I am putting you on the cancellation list.  Monitor for return of black stools and let me know if this occurs immediately.  We will plan to see back after your procedures.  Do not hesitate to call if you have any questions or concerns prior.   It was nice to meet you today!   Aliene Altes, PA-C [facility]

## 2021-02-26 ENCOUNTER — Telehealth: Payer: Self-pay

## 2021-02-26 ENCOUNTER — Other Ambulatory Visit: Payer: Self-pay

## 2021-02-26 MED ORDER — PEG 3350-KCL-NA BICARB-NACL 420 G PO SOLR: 4000.0000 mL | ORAL | 0 refills | Status: DC

## 2021-02-26 NOTE — Telephone Encounter (Signed)
Noted  

## 2021-02-26 NOTE — Telephone Encounter (Signed)
Aliene Altes PA requested TCS/EGD w/Propofol w/Dr. Gala Romney ASA 3 done soon d/t melena. Pt was placed on cancellation list.   Dr. Gala Romney doesn't have any availabilities soon. Called pt, offered to schedule procedure w/Dr. Abbey Chatters. Pt agreeable. TCS/EGD scheduled for 03/11/21--PM. Rx for prep sent to pharmacy. Orders entered.  FYI to [facility], procedures were scheduled with Dr. Abbey Chatters.  PA for EGD submitted via [facility] website. PA# [id number], valid 03/11/21-06/09/21. No PA needed for TCS from [facility] Medicare.

## 2021-02-26 NOTE — Telephone Encounter (Signed)
Pre-op/covid test 03/07/21 at 10:45am. Appt letter mailed with procedure instructions.

## 2021-03-05 NOTE — Patient Instructions (Signed)
[name]  03/05/2021     @PREFPERIOPPHARMACY @   Your procedure is scheduled on  03/11/2021   Report to [facility] at  1245  P.M.   Call this number if you have problems the morning of surgery:  [phone]   Remember:  Follow the diet and prep instructions given to you by the office.                      Take these medicines the morning of surgery with A SIP OF WATER  Atenolol, oxycodone (if needed), protonix.     Please brush your teeth.  Do not wear jewelry, make-up or nail polish.  Do not wear lotions, powders, or perfumes, or deodorant.  Do not shave 48 hours prior to surgery.  Men may shave face and neck.  Do not bring valuables to the hospital.  [facility] is not responsible for any belongings or valuables.  Contacts, dentures or bridgework may not be worn into surgery.  Leave your suitcase in the car.  After surgery it may be brought to your room.  For patients admitted to the hospital, discharge time will be determined by your treatment team.  Patients discharged the day of surgery will not be allowed to drive home and must have someone with them for 24 hours.   Special instructions:  DO NOT smoke tobacco or vape for 24 hours before your procedure.    Please read over the following fact sheets that you were given. Anesthesia Post-op Instructions and Care and Recovery After Surgery       Upper Endoscopy, Adult, Care After This sheet gives you information about how to care for yourself after your procedure. Your health care provider may also give you more specific instructions. If you have problems or questions, contact your health care provider. What can I expect after the procedure? After the procedure, it is common to have:  A sore throat.  Mild stomach pain or discomfort.  Bloating.  Nausea. Follow these instructions at home:  Follow instructions from your health care provider about what to eat or drink after your  procedure.  Return to your normal activities as told by your health care provider. Ask your health care provider what activities are safe for you.  Take over-the-counter and prescription medicines only as told by your health care provider.  If you were given a sedative during the procedure, it can affect you for several hours. Do not drive or operate machinery until your health care provider says that it is safe.  Keep all follow-up visits as told by your health care provider. This is important.   Contact a health care provider if you have:  A sore throat that lasts longer than one day.  Trouble swallowing. Get help right away if:  You vomit blood or your vomit looks like coffee grounds.  You have: ? A fever. ? Bloody, black, or tarry stools. ? A severe sore throat or you cannot swallow. ? Difficulty breathing. ? Severe pain in your chest or abdomen. Summary  After the procedure, it is common to have a sore throat, mild stomach discomfort, bloating, and nausea.  If you were given a sedative during the procedure, it can affect you for several hours. Do not drive or operate machinery until your health care provider says that it is safe.  Follow instructions from your health care provider about what to eat or drink after  your procedure.  Return to your normal activities as told by your health care provider. This information is not intended to replace advice given to you by your health care provider. Make sure you discuss any questions you have with your health care provider. Document Revised: 10/31/2019 Document Reviewed: 04/04/2018 Elsevier Patient Education  2021 [place].  Colonoscopy, Adult, Care After This sheet gives you information about how to care for yourself after your procedure. Your health care provider may also give you more specific instructions. If you have problems or questions, contact your health care provider. What can I expect after the procedure? After  the procedure, it is common to have:  A small amount of blood in your stool for 24 hours after the procedure.  Some gas.  Mild cramping or bloating of your abdomen. Follow these instructions at home: Eating and drinking  Drink enough fluid to keep your urine pale yellow.  Follow instructions from your health care provider about eating or drinking restrictions.  Resume your normal diet as instructed by your health care provider. Avoid heavy or fried foods that are hard to digest.   Activity  Rest as told by your health care provider.  Avoid sitting for a long time without moving. Get up to take short walks every 1-2 hours. This is important to improve blood flow and breathing. Ask for help if you feel weak or unsteady.  Return to your normal activities as told by your health care provider. Ask your health care provider what activities are safe for you. Managing cramping and bloating  Try walking around when you have cramps or feel bloated.  Apply heat to your abdomen as told by your health care provider. Use the heat source that your health care provider recommends, such as a moist heat pack or a heating pad. ? Place a towel between your skin and the heat source. ? Leave the heat on for 20-30 minutes. ? Remove the heat if your skin turns bright red. This is especially important if you are unable to feel pain, heat, or cold. You may have a greater risk of getting burned.   General instructions  If you were given a sedative during the procedure, it can affect you for several hours. Do not drive or operate machinery until your health care provider says that it is safe.  For the first 24 hours after the procedure: ? Do not sign important documents. ? Do not drink alcohol. ? Do your regular daily activities at a slower pace than normal. ? Eat soft foods that are easy to digest.  Take over-the-counter and prescription medicines only as told by your health care provider.  Keep all  follow-up visits as told by your health care provider. This is important. Contact a health care provider if:  You have blood in your stool 2-3 days after the procedure. Get help right away if you have:  More than a small spotting of blood in your stool.  Large blood clots in your stool.  Swelling of your abdomen.  Nausea or vomiting.  A fever.  Increasing pain in your abdomen that is not relieved with medicine. Summary  After the procedure, it is common to have a small amount of blood in your stool. You may also have mild cramping and bloating of your abdomen.  If you were given a sedative during the procedure, it can affect you for several hours. Do not drive or operate machinery until your health care provider says that it  is safe.  Get help right away if you have a lot of blood in your stool, nausea or vomiting, a fever, or increased pain in your abdomen. This information is not intended to replace advice given to you by your health care provider. Make sure you discuss any questions you have with your health care provider. Document Revised: 10/27/2019 Document Reviewed: 05/29/2019 Elsevier Patient Education  2021 [place] After This sheet gives you information about how to care for yourself after your procedure. Your health care provider may also give you more specific instructions. If you have problems or questions, contact your health care provider. What can I expect after the procedure? After the procedure, it is common to have:  Tiredness.  Forgetfulness about what happened after the procedure.  Impaired judgment for important decisions.  Nausea or vomiting.  Some difficulty with balance. Follow these instructions at home: For the time period you were told by your health care provider:  Rest as needed.  Do not participate in activities where you could fall or become injured.  Do not drive or use machinery.  Do not drink  alcohol.  Do not take sleeping pills or medicines that cause drowsiness.  Do not make important decisions or sign legal documents.  Do not take care of children on your own.      Eating and drinking  Follow the diet that is recommended by your health care provider.  Drink enough fluid to keep your urine pale yellow.  If you vomit: ? Drink water, juice, or soup when you can drink without vomiting. ? Make sure you have little or no nausea before eating solid foods. General instructions  Have a responsible adult stay with you for the time you are told. It is important to have someone help care for you until you are awake and alert.  Take over-the-counter and prescription medicines only as told by your health care provider.  If you have sleep apnea, surgery and certain medicines can increase your risk for breathing problems. Follow instructions from your health care provider about wearing your sleep device: ? Anytime you are sleeping, including during daytime naps. ? While taking prescription pain medicines, sleeping medicines, or medicines that make you drowsy.  Avoid smoking.  Keep all follow-up visits as told by your health care provider. This is important. Contact a health care provider if:  You keep feeling nauseous or you keep vomiting.  You feel light-headed.  You are still sleepy or having trouble with balance after 24 hours.  You develop a rash.  You have a fever.  You have redness or swelling around the IV site. Get help right away if:  You have trouble breathing.  You have new-onset confusion at home. Summary  For several hours after your procedure, you may feel tired. You may also be forgetful and have poor judgment.  Have a responsible adult stay with you for the time you are told. It is important to have someone help care for you until you are awake and alert.  Rest as told. Do not drive or operate machinery. Do not drink alcohol or take sleeping  pills.  Get help right away if you have trouble breathing, or if you suddenly become confused. This information is not intended to replace advice given to you by your health care provider. Make sure you discuss any questions you have with your health care provider. Document Revised: 07/18/2020 Document Reviewed: 10/05/2019 Elsevier Patient Education  2021 [employer]  [employer].  

## 2021-03-07 ENCOUNTER — Other Ambulatory Visit (HOSPITAL_COMMUNITY)
Admission: RE | Admit: 2021-03-07 | Discharge: 2021-03-07 | Disposition: A | Discharge status: Home / Self Care | Payer: Medicare Other | Source: Ambulatory Visit | Attending: Internal Medicine | Admitting: Internal Medicine

## 2021-03-07 ENCOUNTER — Other Ambulatory Visit: Payer: Self-pay

## 2021-03-07 ENCOUNTER — Encounter (HOSPITAL_COMMUNITY)
Admission: RE | Admit: 2021-03-07 | Discharge: 2021-03-07 | Disposition: A | Discharge status: Home / Self Care | Payer: Medicare Other | Source: Ambulatory Visit | Attending: Internal Medicine | Admitting: Internal Medicine

## 2021-03-07 ENCOUNTER — Encounter (HOSPITAL_COMMUNITY): Payer: Self-pay

## 2021-03-07 DIAGNOSIS — Z01812 Encounter for preprocedural laboratory examination: Secondary | ICD-10-CM | POA: Diagnosis not present

## 2021-03-07 DIAGNOSIS — Z20822 Contact with and (suspected) exposure to covid-19: Secondary | ICD-10-CM | POA: Diagnosis not present

## 2021-03-07 LAB — CBC WITH DIFFERENTIAL/PLATELET
Abs Immature Granulocytes: 0.02 10*3/uL (ref 0.00–0.07)
Basophils Absolute: 0 10*3/uL (ref 0.0–0.1)
Basophils Relative: 1 %
Eosinophils Absolute: 0.1 10*3/uL (ref 0.0–0.5)
Eosinophils Relative: 3 %
HCT: 34.5 % — ABNORMAL LOW (ref 36.0–46.0)
Hemoglobin: 10.6 g/dL — ABNORMAL LOW (ref 12.0–15.0)
Immature Granulocytes: 0 %
Lymphocytes Relative: 33 %
Lymphs Abs: 1.7 10*3/uL (ref 0.7–4.0)
MCH: 26.8 pg (ref 26.0–34.0)
MCHC: 30.7 g/dL (ref 30.0–36.0)
MCV: 87.1 fL (ref 80.0–100.0)
Monocytes Absolute: 0.5 10*3/uL (ref 0.1–1.0)
Monocytes Relative: 9 %
Neutro Abs: 2.8 10*3/uL (ref 1.7–7.7)
Neutrophils Relative %: 54 %
Platelets: 205 10*3/uL (ref 150–400)
RBC: 3.96 MIL/uL (ref 3.87–5.11)
RDW: 15.3 % (ref 11.5–15.5)
WBC: 5.2 10*3/uL (ref 4.0–10.5)
nRBC: 0 % (ref 0.0–0.2)

## 2021-03-07 LAB — BASIC METABOLIC PANEL
Anion gap: 9 (ref 5–15)
BUN: 26 mg/dL — ABNORMAL HIGH (ref 8–23)
CO2: 22 mmol/L (ref 22–32)
Calcium: 8.9 mg/dL (ref 8.9–10.3)
Chloride: 107 mmol/L (ref 98–111)
Creatinine, Ser: 1.03 mg/dL — ABNORMAL HIGH (ref 0.44–1.00)
GFR, Estimated: 57 mL/min — ABNORMAL LOW (ref >60)
Glucose, Bld: 108 mg/dL — ABNORMAL HIGH (ref 70–99)
Potassium: 4.2 mmol/L (ref 3.5–5.1)
Sodium: 138 mmol/L (ref 135–145)

## 2021-03-08 LAB — SARS CORONAVIRUS 2 (TAT 6-24 HRS): SARS Coronavirus 2: NEGATIVE

## 2021-03-11 ENCOUNTER — Encounter (HOSPITAL_COMMUNITY)
Admission: RE | Disposition: A | Discharge status: Home / Self Care | Payer: Self-pay | Source: Ambulatory Visit | Attending: Internal Medicine

## 2021-03-11 ENCOUNTER — Ambulatory Visit (HOSPITAL_COMMUNITY): Payer: Medicare Other | Admitting: Anesthesiology

## 2021-03-11 ENCOUNTER — Encounter (HOSPITAL_COMMUNITY): Payer: Self-pay

## 2021-03-11 ENCOUNTER — Telehealth: Payer: Self-pay | Admitting: Internal Medicine

## 2021-03-11 ENCOUNTER — Ambulatory Visit (HOSPITAL_COMMUNITY)
Admission: RE | Admit: 2021-03-11 | Discharge: 2021-03-11 | Disposition: A | Discharge status: Home / Self Care | Payer: Medicare Other | Source: Ambulatory Visit | Attending: Internal Medicine | Admitting: Internal Medicine

## 2021-03-11 DIAGNOSIS — Z7982 Long term (current) use of aspirin: Secondary | ICD-10-CM | POA: Insufficient documentation

## 2021-03-11 DIAGNOSIS — Z8719 Personal history of other diseases of the digestive system: Secondary | ICD-10-CM | POA: Insufficient documentation

## 2021-03-11 DIAGNOSIS — Z800 Family history of malignant neoplasm of digestive organs: Secondary | ICD-10-CM | POA: Diagnosis not present

## 2021-03-11 DIAGNOSIS — Z79899 Other long term (current) drug therapy: Secondary | ICD-10-CM | POA: Insufficient documentation

## 2021-03-11 DIAGNOSIS — K2970 Gastritis, unspecified, without bleeding: Secondary | ICD-10-CM | POA: Diagnosis not present

## 2021-03-11 DIAGNOSIS — Z885 Allergy status to narcotic agent status: Secondary | ICD-10-CM | POA: Diagnosis not present

## 2021-03-11 DIAGNOSIS — Z8249 Family history of ischemic heart disease and other diseases of the circulatory system: Secondary | ICD-10-CM | POA: Insufficient documentation

## 2021-03-11 DIAGNOSIS — Z8673 Personal history of transient ischemic attack (TIA), and cerebral infarction without residual deficits: Secondary | ICD-10-CM | POA: Diagnosis not present

## 2021-03-11 DIAGNOSIS — Z1211 Encounter for screening for malignant neoplasm of colon: Secondary | ICD-10-CM | POA: Diagnosis not present

## 2021-03-11 DIAGNOSIS — M48062 Spinal stenosis, lumbar region with neurogenic claudication: Secondary | ICD-10-CM | POA: Diagnosis not present

## 2021-03-11 DIAGNOSIS — K5730 Diverticulosis of large intestine without perforation or abscess without bleeding: Secondary | ICD-10-CM | POA: Insufficient documentation

## 2021-03-11 DIAGNOSIS — D122 Benign neoplasm of ascending colon: Secondary | ICD-10-CM | POA: Diagnosis not present

## 2021-03-11 DIAGNOSIS — Z90710 Acquired absence of both cervix and uterus: Secondary | ICD-10-CM | POA: Insufficient documentation

## 2021-03-11 DIAGNOSIS — I10 Essential (primary) hypertension: Secondary | ICD-10-CM | POA: Diagnosis not present

## 2021-03-11 DIAGNOSIS — K2950 Unspecified chronic gastritis without bleeding: Secondary | ICD-10-CM | POA: Diagnosis not present

## 2021-03-11 DIAGNOSIS — Z86010 Personal history of colonic polyps: Secondary | ICD-10-CM | POA: Insufficient documentation

## 2021-03-11 DIAGNOSIS — D123 Benign neoplasm of transverse colon: Secondary | ICD-10-CM

## 2021-03-11 DIAGNOSIS — K635 Polyp of colon: Secondary | ICD-10-CM | POA: Diagnosis not present

## 2021-03-11 DIAGNOSIS — Z96653 Presence of artificial knee joint, bilateral: Secondary | ICD-10-CM | POA: Diagnosis not present

## 2021-03-11 DIAGNOSIS — K209 Esophagitis, unspecified without bleeding: Secondary | ICD-10-CM | POA: Diagnosis not present

## 2021-03-11 DIAGNOSIS — K921 Melena: Secondary | ICD-10-CM | POA: Insufficient documentation

## 2021-03-11 DIAGNOSIS — K648 Other hemorrhoids: Secondary | ICD-10-CM | POA: Insufficient documentation

## 2021-03-11 DIAGNOSIS — Z888 Allergy status to other drugs, medicaments and biological substances status: Secondary | ICD-10-CM | POA: Diagnosis not present

## 2021-03-11 DIAGNOSIS — D125 Benign neoplasm of sigmoid colon: Secondary | ICD-10-CM

## 2021-03-11 HISTORY — PX: BIOPSY: SHX5522

## 2021-03-11 HISTORY — PX: ESOPHAGOGASTRODUODENOSCOPY (EGD) WITH PROPOFOL: SHX5813

## 2021-03-11 HISTORY — PX: COLONOSCOPY WITH PROPOFOL: SHX5780

## 2021-03-11 HISTORY — PX: POLYPECTOMY: SHX149

## 2021-03-11 SURGERY — COLONOSCOPY WITH PROPOFOL
Anesthesia: General

## 2021-03-11 MED ORDER — DEXAMETHASONE SODIUM PHOSPHATE 4 MG/ML IJ SOLN
INTRAMUSCULAR | Status: DC | PRN
  Administered 2021-03-11: 4 mg via INTRAVENOUS

## 2021-03-11 MED ORDER — OMEPRAZOLE 40 MG PO CPDR: 40.0000 mg | DELAYED_RELEASE_CAPSULE | Freq: Two times a day (BID) | ORAL | 5 refills | Status: DC

## 2021-03-11 MED ORDER — PROPOFOL 10 MG/ML IV BOLUS
INTRAVENOUS | Status: DC | PRN
  Administered 2021-03-11 (×2): 50 mg via INTRAVENOUS
  Administered 2021-03-11 (×2): 20 mg via INTRAVENOUS
  Administered 2021-03-11 (×2): 50 mg via INTRAVENOUS

## 2021-03-11 MED ORDER — PROPOFOL 10 MG/ML IV BOLUS
INTRAVENOUS | Status: AC
  Filled 2021-03-11: qty 20

## 2021-03-11 MED ORDER — STERILE WATER FOR IRRIGATION IR SOLN
Status: DC | PRN
  Administered 2021-03-11: 200 mL

## 2021-03-11 MED ORDER — LIDOCAINE VISCOUS HCL 2 % MT SOLN
OROMUCOSAL | Status: AC
  Filled 2021-03-11: qty 15

## 2021-03-11 MED ORDER — DEXAMETHASONE SODIUM PHOSPHATE 4 MG/ML IJ SOLN
INTRAMUSCULAR | Status: AC
  Filled 2021-03-11: qty 1

## 2021-03-11 MED ORDER — PROPOFOL 500 MG/50ML IV EMUL
INTRAVENOUS | Status: DC | PRN
  Administered 2021-03-11: 150 ug/kg/min via INTRAVENOUS

## 2021-03-11 MED ORDER — LIDOCAINE VISCOUS HCL 2 % MT SOLN
15.0000 mL | Freq: Once | OROMUCOSAL | Status: AC
  Administered 2021-03-11: 15 mL via OROMUCOSAL

## 2021-03-11 MED ORDER — LACTATED RINGERS IV SOLN: INTRAVENOUS | Status: DC

## 2021-03-11 NOTE — Op Note (Signed)
[facility] Patient Name: [name] Procedure Date: 03/11/2021 3:24 PM MRN: [id number] Date of Birth: Feb 18, 1946 Attending MD: Elon Alas. Edgar Frisk CSN: [id number] Age: 75 Admit Type: Outpatient Procedure:                Colonoscopy Indications:              High risk colon cancer surveillance: Personal                            history of colonic polyps Providers:                Elon Alas. Abbey Chatters, DO, Crystal Page, Nelma Rothman,                            Technician Referring MD:              Medicines:                See the Anesthesia note for documentation of the                            administered medications Complications:            No immediate complications. Estimated Blood Loss:     Estimated blood loss was minimal. Procedure:                Pre-Anesthesia Assessment:                           - The anesthesia plan was to use monitored                            anesthesia care (MAC).                           After obtaining informed consent, the colonoscope                            was passed under direct vision. Throughout the                            procedure, the patient's blood pressure, pulse, and                            oxygen saturations were monitored continuously. The                            PCF-HQ190L ([id number]) scope was introduced through                            the anus and advanced to the the cecum, identified                            by appendiceal orifice and ileocecal valve. The                            colonoscopy was performed without difficulty. The  patient tolerated the procedure well. The quality                            of the bowel preparation was evaluated using the                            BBPS [facility] Bowel Preparation Scale) with scores                            of: Right Colon = 2 (minor amount of residual                            staining, small fragments of stool and/or opaque                             liquid, but mucosa seen well), Transverse Colon = 3                            (entire mucosa seen well with no residual staining,                            small fragments of stool or opaque liquid) and Left                            Colon = 3 (entire mucosa seen well with no residual                            staining, small fragments of stool or opaque                            liquid). The total BBPS score equals 8. The quality                            of the bowel preparation was good. Scope In: 3:26:32 PM Scope Out: 3:46:53 PM Scope Withdrawal Time: 0 hours 16 minutes 1 second  Total Procedure Duration: 0 hours 20 minutes 21 seconds  Findings:      The perianal and digital rectal examinations were normal.      Non-bleeding internal hemorrhoids were found during endoscopy.      Multiple small-mouthed diverticula were found in the sigmoid colon.      Three sessile polyps were found in the ascending colon. The polyps were       1 to 2 mm in size. These polyps were removed with a cold biopsy forceps.       Resection and retrieval were complete.      Two sessile polyps were found in the transverse colon. The polyps were 1       to 2 mm in size. These polyps were removed with a cold biopsy forceps.       Resection and retrieval were complete.      A 4 mm polyp was found in the transverse colon. The polyp was sessile.       The polyp was removed with a cold snare. Resection and retrieval were  complete.      The exam was otherwise without abnormality. Impression:               - Non-bleeding internal hemorrhoids.                           - Diverticulosis in the sigmoid colon.                           - Three 1 to 2 mm polyps in the ascending colon,                            removed with a cold biopsy forceps. Resected and                            retrieved.                           - Two 1 to 2 mm polyps in the transverse colon,                             removed with a cold biopsy forceps. Resected and                            retrieved.                           - One 4 mm polyp in the transverse colon, removed                            with a cold snare. Resected and retrieved.                           - The examination was otherwise normal. Moderate Sedation:      Per Anesthesia Care Recommendation:           - Patient has a contact number available for                            emergencies. The signs and symptoms of potential                            delayed complications were discussed with the                            patient. Return to normal activities tomorrow.                            Written discharge instructions were provided to the                            patient.                           - Resume previous diet.                           -  Continue present medications.                           - Await pathology results.                           - Repeat colonoscopy in 5 years for surveillance.                           - Return to GI clinic in 3 months. Procedure Code(s):        --- Professional ---                           [phone], Colonoscopy, flexible; with removal of                            tumor(s), polyp(s), or other lesion(s) by snare                            technique                           [id number], 27, Colonoscopy, flexible; with biopsy,                            single or multiple Diagnosis Code(s):        --- Professional ---                           K63.5, Polyp of colon                           Z86.010, Personal history of colonic polyps                           K64.8, Other hemorrhoids                           K57.30, Diverticulosis of large intestine without                            perforation or abscess without bleeding CPT copyright 2019 American [employer] Association. All rights reserved. The codes documented in this report are preliminary and upon coder review may   be revised to meet current compliance requirements. Elon Alas. Abbey Chatters, DO [place] Abbey Chatters, DO 03/11/2021 3:51:35 PM This report has been signed electronically. Number of Addenda: 0

## 2021-03-11 NOTE — Anesthesia Preprocedure Evaluation (Signed)
Anesthesia Evaluation  Patient identified by MRN, date of birth, ID band Patient awake    Reviewed: Allergy & Precautions, NPO status , Patient's Chart, lab work & pertinent test results  History of Anesthesia Complications Negative for: history of anesthetic complications  Airway Mallampati: I       Dental  (+) Dental Advisory Given   Pulmonary neg pulmonary ROS,    Pulmonary exam normal breath sounds clear to auscultation       Cardiovascular Exercise Tolerance: Good hypertension, Pt. on medications Normal cardiovascular exam Rhythm:Regular Rate:Normal     Neuro/Psych CVA negative psych ROS   GI/Hepatic negative GI ROS, Neg liver ROS,   Endo/Other  negative endocrine ROS  Renal/GU negative Renal ROS     Musculoskeletal  (+) Arthritis  (back sx, Chronic back pain, lumbar stenosis with claudication),   Abdominal   Peds  Hematology negative hematology ROS (+)   Anesthesia Other Findings   Reproductive/Obstetrics                             Anesthesia Physical Anesthesia Plan  ASA: III  Anesthesia Plan: General   Post-op Pain Management:    Induction: Intravenous  PONV Risk Score and Plan: Propofol infusion  Airway Management Planned: Nasal Cannula and Natural Airway  Additional Equipment:   Intra-op Plan:   Post-operative Plan:   Informed Consent: I have reviewed the patients History and Physical, chart, labs and discussed the procedure including the risks, benefits and alternatives for the proposed anesthesia with the patient or authorized representative who has indicated his/her understanding and acceptance.     Dental advisory given  Plan Discussed with: CRNA and Surgeon  Anesthesia Plan Comments:         Anesthesia Quick Evaluation

## 2021-03-11 NOTE — Telephone Encounter (Signed)
PER DR CARVER, PATIENT IS TO HAVE REPEAT EGD IN 8 WEEKS

## 2021-03-11 NOTE — Transfer of Care (Signed)
Immediate Anesthesia Transfer of Care Note  Patient: [name]  Procedure(s) Performed: COLONOSCOPY WITH PROPOFOL (N/A ) ESOPHAGOGASTRODUODENOSCOPY (EGD) WITH PROPOFOL (N/A ) BIOPSY POLYPECTOMY INTESTINAL  Patient Location: PACU  Anesthesia Type:General  Level of Consciousness: awake, alert , oriented and sedated  Airway & Oxygen Therapy: Patient Spontanous Breathing and Patient connected to nasal cannula oxygen  Post-op Assessment: Post -op Vital signs reviewed and stable  Post vital signs: Reviewed and stable  Last Vitals:  Vitals Value Taken Time  BP 124/65 03/11/21 1554  Temp 98.2   Pulse 76 03/11/21 1558  Resp 23 03/11/21 1558  SpO2 86 % 03/11/21 1558  Vitals shown include unvalidated device data.  Last Pain:  Vitals:   03/11/21 1512  TempSrc:   PainSc: 0-No pain         Complications: No complications documented.

## 2021-03-11 NOTE — Interval H&P Note (Signed)
History and Physical Interval Note:  03/11/2021 2:48 PM  [name]  has presented today for surgery, with the diagnosis of melena, history of adenomatous colon polyps.  The various methods of treatment have been discussed with the patient and family. After consideration of risks, benefits and other options for treatment, the patient has consented to  Procedure(s) with comments: COLONOSCOPY WITH PROPOFOL (N/A) - PM ESOPHAGOGASTRODUODENOSCOPY (EGD) WITH PROPOFOL (N/A) as a surgical intervention.  The patient's history has been reviewed, patient examined, no change in status, stable for surgery.  I have reviewed the patient's chart and labs.  Questions were answered to the patient's satisfaction.     Eloise Harman

## 2021-03-11 NOTE — Anesthesia Postprocedure Evaluation (Signed)
Anesthesia Post Note  Patient: [name]  Procedure(s) Performed: COLONOSCOPY WITH PROPOFOL (N/A ) ESOPHAGOGASTRODUODENOSCOPY (EGD) WITH PROPOFOL (N/A ) BIOPSY POLYPECTOMY INTESTINAL  Patient location during evaluation: Phase II Anesthesia Type: General Level of consciousness: awake and alert and oriented Pain management: pain level controlled Vital Signs Assessment: post-procedure vital signs reviewed and stable Respiratory status: spontaneous breathing and respiratory function stable Cardiovascular status: blood pressure returned to baseline and stable Postop Assessment: no apparent nausea or vomiting Anesthetic complications: no   No complications documented.   Last Vitals:  Vitals:   03/11/21 1555 03/11/21 1600  BP: 124/65 (!) 137/91  Pulse: 80 78  Resp: 14 (!) 21  Temp: 36.8 C   SpO2: 97% 98%    Last Pain:  Vitals:   03/11/21 1600  TempSrc:   PainSc: 0-No pain                 [name] C Laryssa Hassing

## 2021-03-11 NOTE — Op Note (Signed)
[facility] Patient Name: [name] Procedure Date: 03/11/2021 3:03 PM MRN: [id number] Date of Birth: [birth year] Attending MD: Elon Alas. Abbey Chatters DO CSN: [id number] Age: 75 Admit Type: Outpatient Procedure:                Upper GI endoscopy Indications:              Melena Providers:                Elon Alas. Abbey Chatters, DO, Lambert Mody, Dereck Leep, Technician Referring MD:              Medicines:                See the Anesthesia note for documentation of the                            administered medications Complications:            No immediate complications. Estimated Blood Loss:     Estimated blood loss was minimal. Procedure:                Pre-Anesthesia Assessment:                           - The anesthesia plan was to use monitored                            anesthesia care (MAC).                           After obtaining informed consent, the endoscope was                            passed under direct vision. Throughout the                            procedure, the patient's blood pressure, pulse, and                            oxygen saturations were monitored continuously. The                            GIF-H190 ([id number]) scope was introduced through the                            mouth, and advanced to the second part of duodenum.                            The upper GI endoscopy was accomplished without                            difficulty. The patient tolerated the procedure                            well. Scope In: 3:14:52 PM Scope  Out: 3:20:04 PM Total Procedure Duration: 0 hours 5 minutes 12 seconds  Findings:      LA Grade D (one or more mucosal breaks involving at least 75% of       esophageal circumference) esophagitis with no bleeding was found at the       gastroesophageal junction.      Diffuse mild inflammation characterized by erythema was found in the       entire examined stomach. Biopsies were  taken with a cold forceps for       Helicobacter pylori testing.      The duodenal bulb, first portion of the duodenum and second portion of       the duodenum were normal. Impression:               - LA Grade D esophagitis with no bleeding.                           - Gastritis. Biopsied.                           - Normal duodenal bulb, first portion of the                            duodenum and second portion of the duodenum. Moderate Sedation:      Per Anesthesia Care Recommendation:           - Patient has a contact number available for                            emergencies. The signs and symptoms of potential                            delayed complications were discussed with the                            patient. Return to normal activities tomorrow.                            Written discharge instructions were provided to the                            patient.                           - Resume previous diet.                           - Continue present medications.                           - Await pathology results.                           - Repeat upper endoscopy in 8-12 weeks to check                            healing. Will need to perform GEJ biospies to rule  out underlying Barretts' and/or dysplasia                           - Return to GI clinic in 3 months.                           - Use a proton pump inhibitor PO BID.                           - No ibuprofen, naproxen, or other non-steroidal                            anti-inflammatory drugs. Procedure Code(s):        --- Professional ---                           [phone], Esophagogastroduodenoscopy, flexible,                            transoral; with biopsy, single or multiple Diagnosis Code(s):        --- Professional ---                           K20.90, Esophagitis, unspecified without bleeding                           K29.70, Gastritis, unspecified, without bleeding                            K92.1, Melena (includes Hematochezia) CPT copyright 2019 American Medical Association. All rights reserved. The codes documented in this report are preliminary and upon coder review may  be revised to meet current compliance requirements. Elon Alas. Abbey Chatters, DO [name], DO 03/11/2021 3:25:48 PM This report has been signed electronically. Number of Addenda: 0

## 2021-03-11 NOTE — Discharge Instructions (Signed)
EGD Discharge instructions Please read the instructions outlined below and refer to this sheet in the next few weeks. These discharge instructions provide you with general information on caring for yourself after you leave the hospital. Your doctor may also give you specific instructions. While your treatment has been planned according to the most current medical practices available, unavoidable complications occasionally occur. If you have any problems or questions after discharge, please call your doctor. ACTIVITY  You may resume your regular activity but move at a slower pace for the next 24 hours.   Take frequent rest periods for the next 24 hours.   Walking will help expel (get rid of) the air and reduce the bloated feeling in your abdomen.   No driving for 24 hours (because of the anesthesia (medicine) used during the test).   You may shower.   Do not sign any important legal documents or operate any machinery for 24 hours (because of the anesthesia used during the test).  NUTRITION  Drink plenty of fluids.   You may resume your normal diet.   Begin with a light meal and progress to your normal diet.   Avoid alcoholic beverages for 24 hours or as instructed by your caregiver.  MEDICATIONS  You may resume your normal medications unless your caregiver tells you otherwise.  WHAT YOU CAN EXPECT TODAY  You may experience abdominal discomfort such as a feeling of fullness or "gas" pains.  FOLLOW-UP  Your doctor will discuss the results of your test with you.  SEEK IMMEDIATE MEDICAL ATTENTION IF ANY OF THE FOLLOWING OCCUR:  Excessive nausea (feeling sick to your stomach) and/or vomiting.   Severe abdominal pain and distention (swelling).   Trouble swallowing.   Temperature over 101 F (37.8 C).   Rectal bleeding or vomiting of blood.     Colonoscopy Discharge Instructions  Read the instructions outlined below and refer to this sheet in the next few weeks. These  discharge instructions provide you with general information on caring for yourself after you leave the hospital. Your doctor may also give you specific instructions. While your treatment has been planned according to the most current medical practices available, unavoidable complications occasionally occur.   ACTIVITY  You may resume your regular activity, but move at a slower pace for the next 24 hours.   Take frequent rest periods for the next 24 hours.   Walking will help get rid of the air and reduce the bloated feeling in your belly (abdomen).   No driving for 24 hours (because of the medicine (anesthesia) used during the test).    Do not sign any important legal documents or operate any machinery for 24 hours (because of the anesthesia used during the test).  NUTRITION  Drink plenty of fluids.   You may resume your normal diet as instructed by your doctor.   Begin with a light meal and progress to your normal diet. Heavy or fried foods are harder to digest and may make you feel sick to your stomach (nauseated).   Avoid alcoholic beverages for 24 hours or as instructed.  MEDICATIONS  You may resume your normal medications unless your doctor tells you otherwise.  WHAT YOU CAN EXPECT TODAY  Some feelings of bloating in the abdomen.   Passage of more gas than usual.   Spotting of blood in your stool or on the toilet paper.  IF YOU HAD POLYPS REMOVED DURING THE COLONOSCOPY:  No aspirin products for 7 days or as instructed.  No alcohol for 7 days or as instructed.   Eat a soft diet for the next 24 hours.  FINDING OUT THE RESULTS OF YOUR TEST Not all test results are available during your visit. If your test results are not back during the visit, make an appointment with your caregiver to find out the results. Do not assume everything is normal if you have not heard from your caregiver or the medical facility. It is important for you to follow up on all of your test results.   SEEK IMMEDIATE MEDICAL ATTENTION IF:  You have more than a spotting of blood in your stool.   Your belly is swollen (abdominal distention).   You are nauseated or vomiting.   You have a temperature over 101.   You have abdominal pain or discomfort that is severe or gets worse throughout the day.   Your EGD revealed a large amount inflammation in your esophagus.  I am concerned that you may have underlying Barrett's esophagus as well.  We will need to treat this with twice daily PPI.  I am going to send you in generic Prilosec to your pharmacy.  I want you to take this 30 minutes before breakfast and 30 minutes for dinner.  We will need to repeat EGD in 8 weeks to ensure healing and biopsy the area.  Your colonoscopy revealed 5 polyp(s) which I removed successfully. Await pathology results, my office will contact you. I recommend repeating colonoscopy in 5 years for surveillance purposes.   Follow up with GI in 3 months   I hope you have a great rest of your week!  Elon Alas. Abbey Chatters, D.O. Gastroenterology and Hepatology [facility]

## 2021-03-12 DIAGNOSIS — E782 Mixed hyperlipidemia: Secondary | ICD-10-CM | POA: Diagnosis not present

## 2021-03-12 DIAGNOSIS — F5101 Primary insomnia: Secondary | ICD-10-CM | POA: Diagnosis not present

## 2021-03-12 DIAGNOSIS — D509 Iron deficiency anemia, unspecified: Secondary | ICD-10-CM | POA: Diagnosis not present

## 2021-03-12 DIAGNOSIS — I10 Essential (primary) hypertension: Secondary | ICD-10-CM | POA: Diagnosis not present

## 2021-03-12 DIAGNOSIS — R944 Abnormal results of kidney function studies: Secondary | ICD-10-CM | POA: Diagnosis not present

## 2021-03-12 DIAGNOSIS — R7301 Impaired fasting glucose: Secondary | ICD-10-CM | POA: Diagnosis not present

## 2021-03-12 NOTE — Telephone Encounter (Signed)
Pre-op/covid test 05/02/21. Appt letter mailed with procedure instructions.

## 2021-03-12 NOTE — Telephone Encounter (Signed)
Tried to call pt, husband said she had went to another appt. Asked him to have her call office.

## 2021-03-12 NOTE — Telephone Encounter (Signed)
Spoke to pt, EGD scheduled for 05/06/21 at 7:30am. Orders entered.  PA for EGD submitted via [facility] website. PA# [id number], valid 05/06/21-08/04/21.

## 2021-03-13 ENCOUNTER — Encounter (HOSPITAL_COMMUNITY): Payer: Self-pay | Admitting: Internal Medicine

## 2021-03-13 DIAGNOSIS — N1831 Chronic kidney disease, stage 3a: Secondary | ICD-10-CM | POA: Diagnosis not present

## 2021-03-13 DIAGNOSIS — R7301 Impaired fasting glucose: Secondary | ICD-10-CM | POA: Diagnosis not present

## 2021-03-13 DIAGNOSIS — M1711 Unilateral primary osteoarthritis, right knee: Secondary | ICD-10-CM | POA: Diagnosis not present

## 2021-03-13 DIAGNOSIS — D509 Iron deficiency anemia, unspecified: Secondary | ICD-10-CM | POA: Diagnosis not present

## 2021-03-13 DIAGNOSIS — Z96652 Presence of left artificial knee joint: Secondary | ICD-10-CM | POA: Diagnosis not present

## 2021-03-13 DIAGNOSIS — I10 Essential (primary) hypertension: Secondary | ICD-10-CM | POA: Diagnosis not present

## 2021-03-13 DIAGNOSIS — R944 Abnormal results of kidney function studies: Secondary | ICD-10-CM | POA: Diagnosis not present

## 2021-03-13 DIAGNOSIS — Z96651 Presence of right artificial knee joint: Secondary | ICD-10-CM | POA: Diagnosis not present

## 2021-03-13 DIAGNOSIS — Z8739 Personal history of other diseases of the musculoskeletal system and connective tissue: Secondary | ICD-10-CM | POA: Diagnosis not present

## 2021-03-13 DIAGNOSIS — Z2821 Immunization not carried out because of patient refusal: Secondary | ICD-10-CM | POA: Diagnosis not present

## 2021-03-13 DIAGNOSIS — E782 Mixed hyperlipidemia: Secondary | ICD-10-CM | POA: Diagnosis not present

## 2021-03-13 DIAGNOSIS — M1712 Unilateral primary osteoarthritis, left knee: Secondary | ICD-10-CM | POA: Diagnosis not present

## 2021-03-13 LAB — SURGICAL PATHOLOGY

## 2021-03-31 DIAGNOSIS — I10 Essential (primary) hypertension: Secondary | ICD-10-CM | POA: Diagnosis not present

## 2021-03-31 DIAGNOSIS — K219 Gastro-esophageal reflux disease without esophagitis: Secondary | ICD-10-CM | POA: Diagnosis not present

## 2021-04-28 DIAGNOSIS — M545 Low back pain, unspecified: Secondary | ICD-10-CM | POA: Diagnosis not present

## 2021-04-28 DIAGNOSIS — M5417 Radiculopathy, lumbosacral region: Secondary | ICD-10-CM | POA: Diagnosis not present

## 2021-04-30 NOTE — Patient Instructions (Signed)
[name]  04/30/2021     @PREFPERIOPPHARMACY @   Your procedure is scheduled on 05/06/2021.   Report to [facility] at [phone] A.M.  Call this number if you have problems the morning of surgery:  [phone]   Remember:  Follow the diet instructions given to you by the office.    Take these medicines the morning of surgery with A SIP OF WATER    Atenolol, prilosec, oxycodone (if needed).     Please brush your teeth.  Do not wear jewelry, make-up or nail polish.  Do not wear lotions, powders, or perfumes, or deodorant.  Do not shave 48 hours prior to surgery.  Men may shave face and neck.  Do not bring valuables to the hospital.  [facility] is not responsible for any belongings or valuables.  Contacts, dentures or bridgework may not be worn into surgery.  Leave your suitcase in the car.  After surgery it may be brought to your room.  For patients admitted to the hospital, discharge time will be determined by your treatment team.  Patients discharged the day of surgery will not be allowed to drive home and must have someone with them for 24 hours.    Special instructions:  DO NOT smoke tobacco or vape for 24 hours before your procedure.  Please read over the following fact sheets that you were given. Anesthesia Post-op Instructions and Care and Recovery After Surgery      Upper Endoscopy, Adult, Care After This sheet gives you information about how to care for yourself after your procedure. Your health care provider may also give you more specific instructions. If you have problems or questions, contact your health careprovider. What can I expect after the procedure? After the procedure, it is common to have: A sore throat. Mild stomach pain or discomfort. Bloating. Nausea. Follow these instructions at home:  Follow instructions from your health care provider about what to eat or drink after your procedure. Return to your normal activities as told by your  health care provider. Ask your health care provider what activities are safe for you. Take over-the-counter and prescription medicines only as told by your health care provider. If you were given a sedative during the procedure, it can affect you for several hours. Do not drive or operate machinery until your health care provider says that it is safe. Keep all follow-up visits as told by your health care provider. This is important. Contact a health care provider if you have: A sore throat that lasts longer than one day. Trouble swallowing. Get help right away if: You vomit blood or your vomit looks like coffee grounds. You have: A fever. Bloody, black, or tarry stools. A severe sore throat or you cannot swallow. Difficulty breathing. Severe pain in your chest or abdomen. Summary After the procedure, it is common to have a sore throat, mild stomach discomfort, bloating, and nausea. If you were given a sedative during the procedure, it can affect you for several hours. Do not drive or operate machinery until your health care provider says that it is safe. Follow instructions from your health care provider about what to eat or drink after your procedure. Return to your normal activities as told by your health care provider. This information is not intended to replace advice given to you by your health care provider. Make sure you discuss any questions you have with your healthcare provider. Document Revised: 10/31/2019 Document Reviewed: 04/04/2018 Elsevier  Patient Education  2022 [place] After This sheet gives you information about how to care for yourself after your procedure. Your health care provider may also give you more specific instructions. If you have problems or questions, contact your health careprovider. What can I expect after the procedure? After the procedure, it is common to have: Tiredness. Forgetfulness about what happened after the  procedure. Impaired judgment for important decisions. Nausea or vomiting. Some difficulty with balance. Follow these instructions at home: For the time period you were told by your health care provider:     Rest as needed. Do not participate in activities where you could fall or become injured. Do not drive or use machinery. Do not drink alcohol. Do not take sleeping pills or medicines that cause drowsiness. Do not make important decisions or sign legal documents. Do not take care of children on your own. Eating and drinking Follow the diet that is recommended by your health care provider. Drink enough fluid to keep your urine pale yellow. If you vomit: Drink water, juice, or soup when you can drink without vomiting. Make sure you have little or no nausea before eating solid foods. General instructions Have a responsible adult stay with you for the time you are told. It is important to have someone help care for you until you are awake and alert. Take over-the-counter and prescription medicines only as told by your health care provider. If you have sleep apnea, surgery and certain medicines can increase your risk for breathing problems. Follow instructions from your health care provider about wearing your sleep device: Anytime you are sleeping, including during daytime naps. While taking prescription pain medicines, sleeping medicines, or medicines that make you drowsy. Avoid smoking. Keep all follow-up visits as told by your health care provider. This is important. Contact a health care provider if: You keep feeling nauseous or you keep vomiting. You feel light-headed. You are still sleepy or having trouble with balance after 24 hours. You develop a rash. You have a fever. You have redness or swelling around the IV site. Get help right away if: You have trouble breathing. You have new-onset confusion at home. Summary For several hours after your procedure, you may feel  tired. You may also be forgetful and have poor judgment. Have a responsible adult stay with you for the time you are told. It is important to have someone help care for you until you are awake and alert. Rest as told. Do not drive or operate machinery. Do not drink alcohol or take sleeping pills. Get help right away if you have trouble breathing, or if you suddenly become confused. This information is not intended to replace advice given to you by your health care provider. Make sure you discuss any questions you have with your healthcare provider. Document Revised: 07/18/2020 Document Reviewed: 10/05/2019 Elsevier Patient Education  2022 [employer].

## 2021-05-02 ENCOUNTER — Encounter (HOSPITAL_COMMUNITY)
Admission: RE | Admit: 2021-05-02 | Discharge: 2021-05-02 | Disposition: A | Discharge status: Home / Self Care | Payer: Medicare Other | Source: Ambulatory Visit | Attending: Internal Medicine | Admitting: Internal Medicine

## 2021-05-02 ENCOUNTER — Other Ambulatory Visit: Payer: Self-pay

## 2021-05-02 ENCOUNTER — Other Ambulatory Visit (HOSPITAL_COMMUNITY): Payer: Medicare Other | Attending: Internal Medicine

## 2021-05-02 ENCOUNTER — Encounter (HOSPITAL_COMMUNITY): Payer: Self-pay

## 2021-05-06 ENCOUNTER — Encounter (HOSPITAL_COMMUNITY)
Admission: RE | Disposition: A | Discharge status: Home / Self Care | Payer: Self-pay | Source: Home / Self Care | Attending: Internal Medicine

## 2021-05-06 ENCOUNTER — Other Ambulatory Visit: Payer: Self-pay

## 2021-05-06 ENCOUNTER — Ambulatory Visit (HOSPITAL_COMMUNITY): Payer: Medicare Other | Admitting: Anesthesiology

## 2021-05-06 ENCOUNTER — Encounter (HOSPITAL_COMMUNITY): Payer: Self-pay

## 2021-05-06 ENCOUNTER — Ambulatory Visit (HOSPITAL_COMMUNITY)
Admission: RE | Admit: 2021-05-06 | Discharge: 2021-05-06 | Disposition: A | Discharge status: Home / Self Care | Payer: Medicare Other | Attending: Internal Medicine | Admitting: Internal Medicine

## 2021-05-06 DIAGNOSIS — Z885 Allergy status to narcotic agent status: Secondary | ICD-10-CM | POA: Insufficient documentation

## 2021-05-06 DIAGNOSIS — Z96653 Presence of artificial knee joint, bilateral: Secondary | ICD-10-CM | POA: Diagnosis not present

## 2021-05-06 DIAGNOSIS — Z79899 Other long term (current) drug therapy: Secondary | ICD-10-CM | POA: Diagnosis not present

## 2021-05-06 DIAGNOSIS — Z7982 Long term (current) use of aspirin: Secondary | ICD-10-CM | POA: Diagnosis not present

## 2021-05-06 DIAGNOSIS — Z8673 Personal history of transient ischemic attack (TIA), and cerebral infarction without residual deficits: Secondary | ICD-10-CM | POA: Insufficient documentation

## 2021-05-06 DIAGNOSIS — Z888 Allergy status to other drugs, medicaments and biological substances status: Secondary | ICD-10-CM | POA: Diagnosis not present

## 2021-05-06 DIAGNOSIS — Z8719 Personal history of other diseases of the digestive system: Secondary | ICD-10-CM | POA: Insufficient documentation

## 2021-05-06 DIAGNOSIS — Z800 Family history of malignant neoplasm of digestive organs: Secondary | ICD-10-CM | POA: Diagnosis not present

## 2021-05-06 DIAGNOSIS — Z09 Encounter for follow-up examination after completed treatment for conditions other than malignant neoplasm: Secondary | ICD-10-CM | POA: Insufficient documentation

## 2021-05-06 DIAGNOSIS — K2289 Other specified disease of esophagus: Secondary | ICD-10-CM | POA: Diagnosis not present

## 2021-05-06 DIAGNOSIS — K3189 Other diseases of stomach and duodenum: Secondary | ICD-10-CM | POA: Diagnosis not present

## 2021-05-06 DIAGNOSIS — K2100 Gastro-esophageal reflux disease with esophagitis, without bleeding: Secondary | ICD-10-CM

## 2021-05-06 HISTORY — PX: BIOPSY: SHX5522

## 2021-05-06 HISTORY — PX: ESOPHAGOGASTRODUODENOSCOPY (EGD) WITH PROPOFOL: SHX5813

## 2021-05-06 SURGERY — ESOPHAGOGASTRODUODENOSCOPY (EGD) WITH PROPOFOL
Anesthesia: General

## 2021-05-06 MED ORDER — LIDOCAINE HCL (CARDIAC) PF 50 MG/5ML IV SOSY
PREFILLED_SYRINGE | INTRAVENOUS | Status: DC | PRN
  Administered 2021-05-06: 100 mg via INTRAVENOUS

## 2021-05-06 MED ORDER — LACTATED RINGERS IV SOLN: INTRAVENOUS | Status: DC

## 2021-05-06 MED ORDER — OMEPRAZOLE 40 MG PO CPDR: 40.0000 mg | DELAYED_RELEASE_CAPSULE | Freq: Every day | ORAL | 5 refills | Status: DC

## 2021-05-06 MED ORDER — PROPOFOL 10 MG/ML IV BOLUS
INTRAVENOUS | Status: AC
  Filled 2021-05-06: qty 280

## 2021-05-06 MED ORDER — LIDOCAINE HCL (PF) 2 % IJ SOLN
INTRAMUSCULAR | Status: AC
  Filled 2021-05-06: qty 5

## 2021-05-06 MED ORDER — PROPOFOL 10 MG/ML IV BOLUS
INTRAVENOUS | Status: DC | PRN
  Administered 2021-05-06: 125 ug/kg/min via INTRAVENOUS
  Administered 2021-05-06: 80 mg via INTRAVENOUS

## 2021-05-06 MED ORDER — PHENYLEPHRINE 40 MCG/ML (10ML) SYRINGE FOR IV PUSH (FOR BLOOD PRESSURE SUPPORT)
PREFILLED_SYRINGE | INTRAVENOUS | Status: AC
  Filled 2021-05-06: qty 10

## 2021-05-06 NOTE — H&P (Signed)
Primary Care Physician:  Celene Squibb, MD Primary Gastroenterologist:  Dr. Abbey Chatters  Pre-Procedure History & Physical: HPI:  [name] is a 75 y.o. female is here for an EGD due to history of LA grade D esophagitis. Has been on BID PPI since last EGD in April. Doing well. Here to evaluate healing as well as screen for underlying Barrett's esophagus.   Past Medical History:  Diagnosis Date   Arthritis    Cataracts, bilateral    surgery to remove cataracts - bilateral   Chronic back pain    Hypertension    Stroke [facility]) 2019   Wears dentures     Past Surgical History:  Procedure Laterality Date   ABDOMINAL HYSTERECTOMY  46   APPENDECTOMY     BACK SURGERY  2010   BACK SURGERY  2015   BACK SURGERY  06/2016   BIOPSY  03/11/2021   Procedure: BIOPSY;  Surgeon: Eloise Harman, DO;  [facility];  Service: Endoscopy;;   CATARACT EXTRACTION W/PHACO Left 01/07/2017   Procedure: CATARACT EXTRACTION PHACO AND INTRAOCULAR LENS PLACEMENT ([facility]);  Surgeon: Tonny Branch, MD;  Location: [facility];  Service: Ophthalmology;  Laterality: Left;  left CDE 9.31    CATARACT EXTRACTION W/PHACO Right 01/25/2017   Procedure: CATARACT EXTRACTION PHACO AND INTRAOCULAR LENS PLACEMENT ([facility]) CDE:16.07;  Surgeon: Tonny Branch, MD;  Location: [facility];  Service: Ophthalmology;  Laterality: Right;  right   COLONOSCOPY  04/2008   Surgeon: Dr. Gala Romney; anal papilla, internal hemorrhoids, 5 mm polyp and diminutive polyp removed.  Pathology with 1 tubular adenoma and benign colonic mucosa with focal surface erosion.  Recommend repeat colonoscopy in 5 years.   COLONOSCOPY WITH PROPOFOL N/A 03/11/2021   Procedure: COLONOSCOPY WITH PROPOFOL;  Surgeon: Eloise Harman, DO;  [facility];  Service: Endoscopy;  Laterality: N/A;  PM   ESOPHAGOGASTRODUODENOSCOPY  04/2008   Surgeon: Dr. Gala Romney; circumferential distal esophageal erosions consistent with nonerosive reflux esophagitis, small hiatal hernia, antral  erosions, area of scar versus adenomatous change s/p biopsy (reactive gastropathy, negative H. pylori), normal examined duodenum.   ESOPHAGOGASTRODUODENOSCOPY (EGD) WITH PROPOFOL N/A 03/11/2021   Procedure: ESOPHAGOGASTRODUODENOSCOPY (EGD) WITH PROPOFOL;  Surgeon: Eloise Harman, DO;  [facility];  Service: Endoscopy;  Laterality: N/A;   FRACTURE SURGERY Right    arm  1974   LUMBAR WOUND DEBRIDEMENT N/A 09/23/2017   Procedure: Exploration of Lumbar Wound; Repair of Pseudomeningocele;  Surgeon: Newman Pies, MD;  Location: [place];  Service: Neurosurgery;  Laterality: N/A;   POLYPECTOMY  03/11/2021   Procedure: POLYPECTOMY INTESTINAL;  Surgeon: Eloise Harman, DO;  [facility];  Service: Endoscopy;;   TONSILLECTOMY     TOTAL KNEE ARTHROPLASTY Right 05/30/2019   Procedure: RIGHT TOTAL KNEE ARTHROPLASTY;  Surgeon: Mcarthur Rossetti, MD;  Location: [place];  Service: Orthopedics;  Laterality: Right;   TOTAL KNEE ARTHROPLASTY Left 10/17/2019   TOTAL KNEE ARTHROPLASTY Left 10/17/2019   Procedure: LEFT TOTAL KNEE ARTHROPLASTY;  Surgeon: Mcarthur Rossetti, MD;  Location: [place];  Service: Orthopedics;  Laterality: Left;    Prior to Admission medications   Medication Sig Start Date End Date Taking? Authorizing Provider  aspirin EC 81 MG tablet Take 81 mg by mouth daily. Swallow whole.   Yes [provider]  atenolol (TENORMIN) 25 MG tablet Take 25 mg by mouth daily. 05/14/19  Yes [provider]  omeprazole (PRILOSEC) 40 MG capsule Take 1 capsule (40 mg total)  by mouth 2 (two) times daily before a meal. 03/11/21 09/07/21 Yes Nonnie Pickney, Elon Alas, DO  oxyCODONE-acetaminophen (PERCOCET/ROXICET) 5-325 MG tablet TAKE 1 TO 2 TABLETS BY MOUTH EVERY 6 HOURS AS NEEDED FOR SEVERE pain Patient taking differently: Take 0.5 tablets by mouth 2 (two) times daily as needed for severe pain. 11/23/19  Yes Mcarthur Rossetti, MD  rosuvastatin (CRESTOR) 5 MG tablet  Take 5 mg by mouth daily. 12/26/20  Yes [provider]  polyethylene glycol-electrolytes (TRILYTE) 420 g solution Take 4,000 mLs by mouth as directed. 02/26/21   Eloise Harman, DO    Allergies as of 03/12/2021 - Review Complete 03/11/2021  Allergen Reaction Noted   Flexeril [cyclobenzaprine] Nausea Only 09/24/2017   Hydrocodone Nausea Only 08/03/2017   Dexlansoprazole Rash 02/22/2021   Lyrica [pregabalin] Palpitations 08/03/2017   Neurontin [gabapentin] Palpitations 12/28/2016    Family History  Problem Relation Age of Onset   Hypertension Mother    Stomach cancer Mother 21   Stomach cancer Maternal Aunt        late 62s   Breast cancer Neg Hx    Stroke Neg Hx    Colon cancer Neg Hx     Social History   Socioeconomic History   Marital status: Married    Spouse name: Not on file   Number of children: Not on file   Years of education: Not on file   Highest education level: Not on file  Occupational History   Not on file  Tobacco Use   Smoking status: Never   Smokeless tobacco: Never  Vaping Use   Vaping Use: Never used  Substance and Sexual Activity   Alcohol use: No   Drug use: No   Sexual activity: Not Currently    Birth control/protection: Post-menopausal  Other Topics Concern   Not on file  Social History Narrative   Not on file   Social Determinants of Health   Financial Resource Strain: Not on file  Food Insecurity: Not on file  Transportation Needs: Not on file  Physical Activity: Not on file  Stress: Not on file  Social Connections: Not on file  Intimate Partner Violence: Not on file    Review of Systems: See HPI, otherwise negative ROS  Physical Exam: Vital signs in last 24 hours: Temp:  [98 F (36.7 C)] 98 F (36.7 C) (06/21 0637) Pulse Rate:  [62] 62 (06/21 0637) Resp:  [20] 20 (06/21 0637) BP: (140)/(82) 140/82 (06/21 0637) SpO2:  [96 %] 96 % (06/21 0254)   General:   Alert,  Well-developed, well-nourished, pleasant and  cooperative in NAD Head:  Normocephalic and atraumatic. Eyes:  Sclera clear, no icterus.   Conjunctiva pink. Ears:  Normal auditory acuity. Nose:  No deformity, discharge,  or lesions. Mouth:  No deformity or lesions, dentition normal. Neck:  Supple; no masses or thyromegaly. Lungs:  Clear throughout to auscultation.   No wheezes, crackles, or rhonchi. No acute distress. Heart:  Regular rate and rhythm; no murmurs, clicks, rubs,  or gallops. Abdomen:  Soft, nontender and nondistended. No masses, hepatosplenomegaly or hernias noted. Normal bowel sounds, without guarding, and without rebound.   Msk:  Symmetrical without gross deformities. Normal posture. Extremities:  Without clubbing or edema. Neurologic:  Alert and  oriented x4;  grossly normal neurologically. Skin:  Intact without significant lesions or rashes. Cervical Nodes:  No significant cervical adenopathy. Psych:  Alert and cooperative. Normal mood and affect.  Impression/Plan: [name] is here for an EGD  due to history of LA grade D esophagitis. Has been on BID PPI since last EGD in April. Doing well. Here to evaluate healing as well as screen for underlying Barrett's esophagus.   The risks of the procedure including infection, bleed, or perforation as well as benefits, limitations, alternatives and imponderables have been reviewed with the patient. Questions have been answered. All parties agreeable.

## 2021-05-06 NOTE — Transfer of Care (Signed)
Immediate Anesthesia Transfer of Care Note  Patient: [name]  Procedure(s) Performed: ESOPHAGOGASTRODUODENOSCOPY (EGD) WITH PROPOFOL BIOPSY  Patient Location: Short Stay  Anesthesia Type:General  Level of Consciousness: awake, alert , oriented and patient cooperative  Airway & Oxygen Therapy: Patient Spontanous Breathing  Post-op Assessment: Report given to RN, Post -op Vital signs reviewed and stable and Patient moving all extremities  Post vital signs: Reviewed and stable  Last Vitals:  Vitals Value Taken Time  BP    Temp    Pulse    Resp    SpO2      Last Pain:  Vitals:   05/06/21 0724  TempSrc:   PainSc: 0-No pain         Complications: No notable events documented.

## 2021-05-06 NOTE — Anesthesia Preprocedure Evaluation (Signed)
Anesthesia Evaluation  Patient identified by MRN, date of birth, ID band Patient awake    Reviewed: Allergy & Precautions, NPO status , Patient's Chart, lab work & pertinent test results, reviewed documented beta blocker date and time   History of Anesthesia Complications Negative for: history of anesthetic complications  Airway Mallampati: I  TM Distance: >3 FB Neck ROM: Full    Dental  (+) Upper Dentures, Lower Dentures   Pulmonary neg pulmonary ROS,    Pulmonary exam normal breath sounds clear to auscultation       Cardiovascular Exercise Tolerance: Good hypertension, Pt. on medications and Pt. on home beta blockers Normal cardiovascular exam Rhythm:Regular Rate:Normal     Neuro/Psych CVA negative psych ROS   GI/Hepatic Neg liver ROS, GERD  Medicated and Controlled,  Endo/Other  negative endocrine ROS  Renal/GU negative Renal ROS     Musculoskeletal  (+) Arthritis  (back sx, Chronic back pain, lumbar stenosis with claudication),   Abdominal   Peds  Hematology negative hematology ROS (+)   Anesthesia Other Findings   Reproductive/Obstetrics                            Anesthesia Physical  Anesthesia Plan  ASA: 3  Anesthesia Plan: General   Post-op Pain Management:    Induction: Intravenous  PONV Risk Score and Plan: Propofol infusion  Airway Management Planned: Nasal Cannula and Natural Airway  Additional Equipment:   Intra-op Plan:   Post-operative Plan:   Informed Consent: I have reviewed the patients History and Physical, chart, labs and discussed the procedure including the risks, benefits and alternatives for the proposed anesthesia with the patient or authorized representative who has indicated his/her understanding and acceptance.       Plan Discussed with: CRNA and Surgeon  Anesthesia Plan Comments:        Anesthesia Quick Evaluation

## 2021-05-06 NOTE — Op Note (Signed)
[facility] Patient Name: [name] Procedure Date: 05/06/2021 7:13 AM MRN: [id number] Date of Birth: 1946-02-17 Attending MD: Elon Alas. Abbey Chatters DO CSN: [id number] Age: 75 Admit Type: Outpatient Procedure:                Upper GI endoscopy Indications:              Reflux esophagitis, Exclusion of Barrett's                            esophagus, Follow-up of esophagitis Providers:                Elon Alas. Abbey Chatters, DO, Crystal Page, Nelma Rothman,                            Technician Referring MD:              Medicines:                See the Anesthesia note for documentation of the                            administered medications Complications:            No immediate complications. Estimated Blood Loss:     Estimated blood loss was minimal. Procedure:                Pre-Anesthesia Assessment:                           - The anesthesia plan was to use monitored                            anesthesia care (MAC).                           After obtaining informed consent, the endoscope was                            passed under direct vision. Throughout the                            procedure, the patient's blood pressure, pulse, and                            oxygen saturations were monitored continuously. The                            GIF-H190 ([id number]) scope was introduced through the                            mouth, and advanced to the second part of duodenum.                            The upper GI endoscopy was accomplished without                            difficulty. The patient tolerated the procedure  well. Scope In: 7:29:36 AM Scope Out: 7:32:59 AM Total Procedure Duration: 0 hours 3 minutes 23 seconds  Findings:      The Z-line was variable and was found 38 cm from the incisors. Biopsies       were taken with a cold forceps for histology. Esophagitis has healed.      The duodenal bulb, first portion of the duodenum and second  portion of       the duodenum were normal.      Localized mildly erythematous mucosa without bleeding was found in the       gastric body. Impression:               - Z-line variable, 38 cm from the incisors.                            Biopsied.                           - Normal duodenal bulb, first portion of the                            duodenum and second portion of the duodenum.                           - Erythematous mucosa in the gastric body. Moderate Sedation:      Per Anesthesia Care Recommendation:           - Patient has a contact number available for                            emergencies. The signs and symptoms of potential                            delayed complications were discussed with the                            patient. Return to normal activities tomorrow.                            Written discharge instructions were provided to the                            patient.                           - Resume previous diet.                           - Continue present medications.                           - Await pathology results.                           - Use Prilosec (omeprazole) 40 mg PO daily.                           -  Return to GI clinic in 3 months.                           - Esophagitis has healed nicely. Await biopsy                            results to rule our Barrett's Procedure Code(s):        --- Professional ---                           [id number], Esophagogastroduodenoscopy, flexible,                            transoral; with biopsy, single or multiple Diagnosis Code(s):        --- Professional ---                           K22.8, Other specified diseases of esophagus                           K31.89, Other diseases of stomach and duodenum                           K21.00, Gastro-esophageal reflux disease with                            esophagitis, without bleeding CPT copyright 2019 American Medical Association. All rights reserved. The  codes documented in this report are preliminary and upon coder review may  be revised to meet current compliance requirements. Elon Alas. Abbey Chatters, DO [place] Abbey Chatters, DO 05/06/2021 7:45:17 AM This report has been signed electronically. Number of Addenda: 0

## 2021-05-06 NOTE — Discharge Instructions (Signed)
EGD Discharge instructions Please read the instructions outlined below and refer to this sheet in the next few weeks. These discharge instructions provide you with general information on caring for yourself after you leave the hospital. Your doctor may also give you specific instructions. While your treatment has been planned according to the most current medical practices available, unavoidable complications occasionally occur. If you have any problems or questions after discharge, please call your doctor. ACTIVITY You may resume your regular activity but move at a slower pace for the next 24 hours.  Take frequent rest periods for the next 24 hours.  Walking will help expel (get rid of) the air and reduce the bloated feeling in your abdomen.  No driving for 24 hours (because of the anesthesia (medicine) used during the test).  You may shower.  Do not sign any important legal documents or operate any machinery for 24 hours (because of the anesthesia used during the test).  NUTRITION Drink plenty of fluids.  You may resume your normal diet.  Begin with a light meal and progress to your normal diet.  Avoid alcoholic beverages for 24 hours or as instructed by your caregiver.  MEDICATIONS You may resume your normal medications unless your caregiver tells you otherwise.  WHAT YOU CAN EXPECT TODAY You may experience abdominal discomfort such as a feeling of fullness or "gas" pains.  FOLLOW-UP Your doctor will discuss the results of your test with you.  SEEK IMMEDIATE MEDICAL ATTENTION IF ANY OF THE FOLLOWING OCCUR: Excessive nausea (feeling sick to your stomach) and/or vomiting.  Severe abdominal pain and distention (swelling).  Trouble swallowing.  Temperature over 101 F (37.8 C).  Rectal bleeding or vomiting of blood.   The inflammation in your esophagus has healed completely. I did take biopsies today and we will have these results next week. You can decrease your omeprazole to once daily.  Follow up with GI in 6 months.    I hope you have a great rest of your week!  Elon Alas. Abbey Chatters, D.O. Gastroenterology and Hepatology [facility]

## 2021-05-06 NOTE — Anesthesia Postprocedure Evaluation (Signed)
Anesthesia Post Note  Patient: [name]  Procedure(s) Performed: ESOPHAGOGASTRODUODENOSCOPY (EGD) WITH PROPOFOL BIOPSY  Patient location during evaluation: Phase II Anesthesia Type: General Level of consciousness: awake and alert and oriented Pain management: pain level controlled Vital Signs Assessment: post-procedure vital signs reviewed and stable Respiratory status: spontaneous breathing and respiratory function stable Cardiovascular status: blood pressure returned to baseline and stable Postop Assessment: no apparent nausea or vomiting Anesthetic complications: no   No notable events documented.   Last Vitals:  Vitals:   05/06/21 0637 05/06/21 0737  BP: 140/82 103/89  Pulse: 62 65  Resp: 20 18  Temp: 36.7 C 36.7 C  SpO2: 96% 99%    Last Pain:  Vitals:   05/06/21 0737  TempSrc: Oral  PainSc: 0-No pain                 Kemesha Mosey C Marquitta Persichetti

## 2021-05-07 ENCOUNTER — Encounter: Payer: Self-pay | Admitting: *Deleted

## 2021-05-07 LAB — SURGICAL PATHOLOGY

## 2021-05-12 ENCOUNTER — Encounter (HOSPITAL_COMMUNITY): Payer: Self-pay | Admitting: Internal Medicine

## 2021-05-15 DIAGNOSIS — I10 Essential (primary) hypertension: Secondary | ICD-10-CM | POA: Diagnosis not present

## 2021-05-15 DIAGNOSIS — K219 Gastro-esophageal reflux disease without esophagitis: Secondary | ICD-10-CM | POA: Diagnosis not present

## 2021-05-16 ENCOUNTER — Other Ambulatory Visit: Payer: Self-pay | Admitting: Internal Medicine

## 2021-05-16 ENCOUNTER — Other Ambulatory Visit (HOSPITAL_COMMUNITY): Payer: Self-pay | Admitting: Internal Medicine

## 2021-05-16 DIAGNOSIS — M545 Low back pain, unspecified: Secondary | ICD-10-CM

## 2021-05-29 ENCOUNTER — Ambulatory Visit (HOSPITAL_COMMUNITY)
Admission: RE | Admit: 2021-05-29 | Discharge: 2021-05-29 | Disposition: A | Discharge status: Home / Self Care | Payer: Medicare Other | Source: Ambulatory Visit | Attending: Internal Medicine | Admitting: Internal Medicine

## 2021-05-29 ENCOUNTER — Ambulatory Visit (INDEPENDENT_AMBULATORY_CARE_PROVIDER_SITE_OTHER): Payer: Medicare Other | Admitting: Gastroenterology

## 2021-05-29 ENCOUNTER — Other Ambulatory Visit: Payer: Self-pay

## 2021-05-29 DIAGNOSIS — M545 Low back pain, unspecified: Secondary | ICD-10-CM | POA: Insufficient documentation

## 2021-05-29 MED ORDER — GADOBUTROL 1 MMOL/ML IV SOLN
10.0000 mL | Freq: Once | INTRAVENOUS | Status: AC | PRN
  Administered 2021-05-29: 10 mL via INTRAVENOUS

## 2021-06-03 ENCOUNTER — Telehealth: Payer: Self-pay | Admitting: Specialist

## 2021-06-03 NOTE — Telephone Encounter (Signed)
Patient called advised she will be at this number [phone] after 4:00pm Please see previous note

## 2021-06-03 NOTE — Telephone Encounter (Signed)
Patient returned call asked for a call back.  The number to contact patient is [phone]

## 2021-06-04 NOTE — Telephone Encounter (Signed)
Scheduled new pt appt for 07/31/21 @ 1030am

## 2021-06-10 ENCOUNTER — Encounter: Payer: Self-pay | Admitting: Specialist

## 2021-06-10 ENCOUNTER — Ambulatory Visit: Payer: Medicare Other | Admitting: Specialist

## 2021-06-10 ENCOUNTER — Ambulatory Visit: Payer: Self-pay

## 2021-06-10 ENCOUNTER — Other Ambulatory Visit: Payer: Self-pay

## 2021-06-10 VITALS — BP 131/81 | HR 76 | Ht 64.0 in | Wt 220.0 lb

## 2021-06-10 DIAGNOSIS — M545 Low back pain, unspecified: Secondary | ICD-10-CM

## 2021-06-10 DIAGNOSIS — M25559 Pain in unspecified hip: Secondary | ICD-10-CM

## 2021-06-10 DIAGNOSIS — M160 Bilateral primary osteoarthritis of hip: Secondary | ICD-10-CM

## 2021-06-10 DIAGNOSIS — R29898 Other symptoms and signs involving the musculoskeletal system: Secondary | ICD-10-CM

## 2021-06-10 MED ORDER — DICLOFENAC SODIUM 50 MG PO TBEC: 50.0000 mg | DELAYED_RELEASE_TABLET | Freq: Two times a day (BID) | ORAL | 3 refills | Status: DC

## 2021-06-10 MED ORDER — MISOPROSTOL 200 MCG PO TABS: 200.0000 ug | ORAL_TABLET | Freq: Two times a day (BID) | ORAL | 3 refills | Status: DC

## 2021-06-10 NOTE — Patient Instructions (Signed)
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 Well padded shoes help. Ice the ice that is suffering from osteoarthritis, only real proven treatments are Weight loss, NSIADs like diclofenac and exercise. Well padded shoes help. Ice 2-3 times a day 15-20 mins at a time.-3 times a day 15-20 mins at a time. Hot showers in the AM.  Injection with steroid may be of benefit. Hemp CBD capsules, amazon.com 5,000-7,000 mg per bottle, 60 capsules per bottle, take one capsule twice a day. Cane in the left hand to use with left leg weight bearing. Follow-Up Instructions: No follow-ups on file.   Physical therapy at [facility] Appointment to see Dr. Ninfa Linden concerning hip OA. Diclofenac EC 50 mg up to BID with meal Take Misoprostol 200 mcg with the diclofenac.

## 2021-06-10 NOTE — Progress Notes (Signed)
Office Visit Note   Patient: [name]           Date of Birth: [birth year]           MRN: [id number] Visit Date: 06/10/2021              Requested by: Celene Squibb, MD [address] [place],  [place] [address] PCP: Celene Squibb, MD   Assessment & Plan: Visit Diagnoses:  1. Low back pain, unspecified back pain laterality, unspecified chronicity, unspecified whether sciatica present   2. Hip pain   3. 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 Well padded shoes help. Ice the ice that is suffering from osteoarthritis, only real proven treatments are Weight loss, NSIADs like diclofenac and exercise. Well padded shoes help. Ice 2-3 times a day 15-20 mins at a time.-3 times a day 15-20 mins at a time. Hot showers in the AM.  Injection with steroid may be of benefit. Hemp CBD capsules, amazon.com 5,000-7,000 mg per bottle, 60 capsules per bottle, take one capsule twice a day. Cane in the left hand to use with left leg weight bearing. Follow-Up Instructions: No follow-ups on file.   Physical therapy at [facility] Appointment to see Dr. Ninfa Linden concerning hip OA. Diclofenac EC 50 mg up to BID with meal Take Misoprostol 200 mcg with the diclofenac.  Follow-Up Instructions: Return in about 4 weeks (around 07/08/2021), or please schedule an appointment to see Dr. Ninfa Linden for bilateral hip arthritis in 2-4 weeks me in 4.   Orders:  Orders Placed This Encounter  Procedures   XR Lumbar Spine 2-3 Views   Ambulatory referral to Physical Therapy   Meds ordered this encounter  Medications   DISCONTD: diclofenac (VOLTAREN) 50 MG EC tablet    Sig: Take 1 tablet (50 mg total) by mouth 2 (two) times daily.    Dispense:  60 tablet    Refill:  3   DISCONTD: misoprostol (CYTOTEC) 200 MCG tablet    Sig: Take 1 tablet (200 mcg total) by mouth 2 (two) times daily at 8 am  and 10 pm.    Dispense:  60 tablet    Refill:  3      Procedures: No procedures performed   Clinical Data: No additional findings.   Subjective: Chief Complaint  Patient presents with   Lower Back - Pain    75 year old female with history of bilateral discectomy L4-5 in 10/2008 by myself then had fusion surgeries L4-5 and L5-S1 in 2016 with extention of the fusion to the L3-4 level 06/2016. 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 Complicated with a dural tear and repair done within 2 weeks. She report weakness right leg since the last surgeries and over the past 8 months she is noticing worsening weakness into both legs, difficulty with getting into and out of the car with having to assist bring legs into the car, hard to raise the hips. No bowel or bladder difficulty. She is unable to go to car shows with Her husband since 2019. She relates that her standing and walking tolerance is worsening. She does grocery shop and leans on the grocery cart and has to take oxycodone for pain, trying not to take more than 3 half Tablets per day, from Dr. Delphina Cahill, primary  care. Hydrocodone makes her sick, only able to take oxycodone. Stopped for 3 months then had to restart taking, this allows her to keep walking and standing. It does not stop the pain.  Feels better sitting but has to get up and walk or change position after A while. The back and the legs hurt, standing up the legs want to give away and they are numb when first standing and then the feeling comes back and she can walk. There is pain constantly and it is worse at night and she can only lie for 30-40 min and has to get up and go to the recliner and then she will get up and go to kitchen. 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   Review of Systems  Constitutional:  Positive for activity change and unexpected weight change (gained 20  lbs last year). Negative for appetite change, chills, diaphoresis, fatigue and fever.  HENT: Negative.    Eyes: Negative.   Respiratory: Negative.    Cardiovascular: Negative.   Gastrointestinal:  Positive for blood in stool (colonoscopy and found polyps and inflamed esophagus).  Endocrine: Negative.   Genitourinary: Negative.   Musculoskeletal:  Positive for back pain (back pain greater than leg and the legs weakness is concerning her.) and gait problem. Negative for arthralgias, joint swelling, myalgias, neck pain and neck stiffness.  Skin: Negative.  Negative for color change, pallor, rash and wound.  Allergic/Immunologic: Positive for environmental allergies (bleeding 45 min Friday). Negative for food allergies and immunocompromised state.  Neurological:  Positive for weakness and numbness. Negative for dizziness, tremors, seizures, syncope, facial asymmetry, speech difficulty, light-headedness and headaches.  Hematological:  Negative for adenopathy. Bruises/bleeds easily.  Psychiatric/Behavioral: Negative.  Negative for agitation, behavioral problems, confusion, decreased concentration, dysphoric mood, hallucinations, self-injury, sleep disturbance and suicidal ideas. The patient is not nervous/anxious and is not hyperactive.     Objective: Vital Signs: BP 131/81 (BP Location: Left Arm, Patient Position: Sitting)   Pulse 76   Ht '5\' 4"'$  (1.626 m)   Wt 220 lb (99.8 kg)   LMP  (LMP Unknown)   BMI 37.76 kg/m   Physical Exam  Back Exam   Tenderness  The patient is experiencing tenderness in the lumbar.     Specialty Comments:  No specialty comments available.  Imaging: No results found.   PMFS History: Patient Active Problem List   Diagnosis Date Noted   H/O adenomatous polyp of colon 02/24/2021   Melena 02/24/2021   Status post total left knee replacement 10/17/2019   Status post total right knee replacement 05/30/2019   Unilateral primary osteoarthritis, left knee  04/18/2019   Unilateral primary osteoarthritis, right knee 04/18/2019   CVA (cerebral vascular accident) ([place]) 08/04/2018   Stroke ([place]) 08/04/2018   Chronic back pain 08/04/2018   Post-operative pain 09/20/2017   Postprocedural pseudomeningocele 09/20/2017   Lumbar stenosis with neurogenic claudication 02/04/2015   Stiffness of joints, not elsewhere classified, multiple sites 10/27/2012   Weakness of both legs 10/27/2012   Difficulty in walking(719.7) 10/27/2012   Past Medical History:  Diagnosis Date   Arthritis    Cataracts, bilateral    Chronic back pain    Essential hypertension    GERD (gastroesophageal reflux disease)    Impaired fasting glucose    Iron deficiency anemia    Mixed hyperlipidemia    Spinal stenosis    Stroke ([place]) 2019   Wears dentures     Family History  Problem  Relation Age of Onset   Hypertension Mother    Stomach cancer Mother 47   Stomach cancer Maternal Aunt        late 30s   Breast cancer Neg Hx    Stroke Neg Hx    Colon cancer Neg Hx     Past Surgical History:  Procedure Laterality Date   ABDOMINAL HYSTERECTOMY  55   APPENDECTOMY     BACK SURGERY  2010   BACK SURGERY  2015   BACK SURGERY  06/2016   BIOPSY  03/11/2021   Procedure: BIOPSY;  Surgeon: Eloise Harman, DO;  [facility];  Service: Endoscopy;;   BIOPSY  05/06/2021   Procedure: BIOPSY;  Surgeon: Eloise Harman, DO;  [facility];  Service: Endoscopy;;   CATARACT EXTRACTION W/PHACO Left 01/07/2017   Procedure: CATARACT EXTRACTION PHACO AND INTRAOCULAR LENS PLACEMENT ([place]);  Surgeon: Tonny Branch, MD;  [facility]: [facility];  Service: Ophthalmology;  Laterality: Left;  left CDE 9.31    CATARACT EXTRACTION W/PHACO Right 01/25/2017   Procedure: CATARACT EXTRACTION PHACO AND INTRAOCULAR LENS PLACEMENT ([place]) CDE:16.07;  Surgeon: Tonny Branch, MD;  [facility]: [facility];  Service: Ophthalmology;  Laterality: Right;  right   COLONOSCOPY  04/2008   Surgeon: Dr. Gala Romney; anal  papilla, internal hemorrhoids, 5 mm polyp and diminutive polyp removed.  Pathology with 1 tubular adenoma and benign colonic mucosa with focal surface erosion.  Recommend repeat colonoscopy in 5 years.   COLONOSCOPY WITH PROPOFOL N/A 03/11/2021   Procedure: COLONOSCOPY WITH PROPOFOL;  Surgeon: Eloise Harman, DO;  [facility];  Service: Endoscopy;  Laterality: N/A;  PM   ESOPHAGOGASTRODUODENOSCOPY  04/2008   Surgeon: Dr. Gala Romney; circumferential distal esophageal erosions consistent with nonerosive reflux esophagitis, small hiatal hernia, antral erosions, area of scar versus adenomatous change s/p biopsy (reactive gastropathy, negative H. pylori), normal examined duodenum.   ESOPHAGOGASTRODUODENOSCOPY (EGD) WITH PROPOFOL N/A 03/11/2021   Procedure: ESOPHAGOGASTRODUODENOSCOPY (EGD) WITH PROPOFOL;  Surgeon: Eloise Harman, DO;  [facility];  Service: Endoscopy;  Laterality: N/A;   ESOPHAGOGASTRODUODENOSCOPY (EGD) WITH PROPOFOL N/A 05/06/2021   Procedure: ESOPHAGOGASTRODUODENOSCOPY (EGD) WITH PROPOFOL;  Surgeon: Eloise Harman, DO;  [facility];  Service: Endoscopy;  Laterality: N/A;  7:30am   FRACTURE SURGERY Right    arm  1974   LUMBAR WOUND DEBRIDEMENT N/A 09/23/2017   Procedure: Exploration of Lumbar Wound; Repair of Pseudomeningocele;  Surgeon: Newman Pies, MD;  Location: [place];  Service: Neurosurgery;  Laterality: N/A;   POLYPECTOMY  03/11/2021   Procedure: POLYPECTOMY INTESTINAL;  Surgeon: Eloise Harman, DO;  [facility];  Service: Endoscopy;;   TONSILLECTOMY     TOTAL KNEE ARTHROPLASTY Right 05/30/2019   Procedure: RIGHT TOTAL KNEE ARTHROPLASTY;  Surgeon: Mcarthur Rossetti, MD;  Location: [place];  Service: Orthopedics;  Laterality: Right;   TOTAL KNEE ARTHROPLASTY Left 10/17/2019   TOTAL KNEE ARTHROPLASTY Left 10/17/2019   Procedure: LEFT TOTAL KNEE ARTHROPLASTY;  Surgeon: Mcarthur Rossetti, MD;  Location: [place];  Service:  Orthopedics;  Laterality: Left;   Social History   Occupational History   Not on file  Tobacco Use   Smoking status: Never   Smokeless tobacco: Never  Vaping Use   Vaping Use: Never used  Substance and Sexual Activity   Alcohol use: No   Drug use: No   Sexual activity: Not Currently    Birth control/protection: Post-menopausal

## 2021-06-11 ENCOUNTER — Ambulatory Visit: Payer: Medicare Other | Admitting: Gastroenterology

## 2021-06-15 DIAGNOSIS — I10 Essential (primary) hypertension: Secondary | ICD-10-CM | POA: Diagnosis not present

## 2021-06-15 DIAGNOSIS — K219 Gastro-esophageal reflux disease without esophagitis: Secondary | ICD-10-CM | POA: Diagnosis not present

## 2021-06-17 ENCOUNTER — Ambulatory Visit (HOSPITAL_COMMUNITY): Payer: Medicare Other | Attending: Specialist | Admitting: Physical Therapy

## 2021-06-17 ENCOUNTER — Other Ambulatory Visit: Payer: Self-pay

## 2021-06-17 ENCOUNTER — Encounter (HOSPITAL_COMMUNITY): Payer: Self-pay | Admitting: Physical Therapy

## 2021-06-17 DIAGNOSIS — M25551 Pain in right hip: Secondary | ICD-10-CM | POA: Diagnosis not present

## 2021-06-17 DIAGNOSIS — M545 Low back pain, unspecified: Secondary | ICD-10-CM | POA: Insufficient documentation

## 2021-06-17 DIAGNOSIS — M25552 Pain in left hip: Secondary | ICD-10-CM | POA: Diagnosis not present

## 2021-06-17 DIAGNOSIS — R262 Difficulty in walking, not elsewhere classified: Secondary | ICD-10-CM | POA: Insufficient documentation

## 2021-06-17 NOTE — Patient Instructions (Signed)
Access Code: [email or web address] URL: [email or web address] Date: 06/17/2021 Prepared by: Josue Hector  Exercises Supine Transversus Abdominis Bracing - Hands on Stomach - 2 x daily - 7 x weekly - 1-2 sets - 10 reps - 5 second hold Supine Bridge - 2 x daily - 7 x weekly - 1-2 sets - 10 reps - 3 second hold Clamshell - 2 x daily - 7 x weekly - 1-2 sets - 10 reps - 3 second hold

## 2021-06-17 NOTE — Therapy (Signed)
[place], [place], [address] Phone: [phone]   Fax:  [phone]  Physical Therapy Evaluation  Patient Details  Name: [name] MRN: [id number] Date of Birth: 1946-10-11 Referring Provider (PT): Basil Dess MD   Encounter Date: 06/17/2021   PT End of Session - 06/17/21 1024     Visit Number 1    Number of Visits 8    Date for PT Re-Evaluation 07/15/21    Authorization Type [employer]    PT Start Time [id number]    PT Stop Time 1030    PT Time Calculation (min) 41 min    Activity Tolerance Patient tolerated treatment well    Behavior During Therapy [facility] for tasks assessed/performed             Past Medical History:  Diagnosis Date   Arthritis    Cataracts, bilateral    surgery to remove cataracts - bilateral   Chronic back pain    Hypertension    Stroke [facility]) 2019   Wears dentures     Past Surgical History:  Procedure Laterality Date   ABDOMINAL HYSTERECTOMY  43   APPENDECTOMY     BACK SURGERY  2010   BACK SURGERY  2015   BACK SURGERY  06/2016   BIOPSY  03/11/2021   Procedure: BIOPSY;  Surgeon: Eloise Harman, DO;  [facility];  Service: Endoscopy;;   BIOPSY  05/06/2021   Procedure: BIOPSY;  Surgeon: Eloise Harman, DO;  [facility];  Service: Endoscopy;;   CATARACT EXTRACTION W/PHACO Left 01/07/2017   Procedure: CATARACT EXTRACTION PHACO AND INTRAOCULAR LENS PLACEMENT ([place]);  Surgeon: Tonny Branch, MD;  [facility];  Service: Ophthalmology;  Laterality: Left;  left CDE 9.31    CATARACT EXTRACTION W/PHACO Right 01/25/2017   Procedure: CATARACT EXTRACTION PHACO AND INTRAOCULAR LENS PLACEMENT ([place]) CDE:16.07;  Surgeon: Tonny Branch, MD;  [facility];  Service: Ophthalmology;  Laterality: Right;  right   COLONOSCOPY  04/2008   Surgeon: Dr. Gala Romney; anal papilla, internal hemorrhoids, 5 mm polyp and diminutive polyp removed.  Pathology with 1 tubular adenoma and  benign colonic mucosa with focal surface erosion.  Recommend repeat colonoscopy in 5 years.   COLONOSCOPY WITH PROPOFOL N/A 03/11/2021   Procedure: COLONOSCOPY WITH PROPOFOL;  Surgeon: Eloise Harman, DO;  [facility];  Service: Endoscopy;  Laterality: N/A;  PM   ESOPHAGOGASTRODUODENOSCOPY  04/2008   Surgeon: Dr. Gala Romney; circumferential distal esophageal erosions consistent with nonerosive reflux esophagitis, small hiatal hernia, antral erosions, area of scar versus adenomatous change s/p biopsy (reactive gastropathy, negative H. pylori), normal examined duodenum.   ESOPHAGOGASTRODUODENOSCOPY (EGD) WITH PROPOFOL N/A 03/11/2021   Procedure: ESOPHAGOGASTRODUODENOSCOPY (EGD) WITH PROPOFOL;  Surgeon: Eloise Harman, DO;  [facility];  Service: Endoscopy;  Laterality: N/A;   ESOPHAGOGASTRODUODENOSCOPY (EGD) WITH PROPOFOL N/A 05/06/2021   Procedure: ESOPHAGOGASTRODUODENOSCOPY (EGD) WITH PROPOFOL;  Surgeon: Eloise Harman, DO;  [facility];  Service: Endoscopy;  Laterality: N/A;  7:30am   FRACTURE SURGERY Right    arm  1974   LUMBAR WOUND DEBRIDEMENT N/A 09/23/2017   Procedure: Exploration of Lumbar Wound; Repair of Pseudomeningocele;  Surgeon: Newman Pies, MD;  Location: [place];  Service: Neurosurgery;  Laterality: N/A;   POLYPECTOMY  03/11/2021   Procedure: POLYPECTOMY INTESTINAL;  Surgeon: Eloise Harman, DO;  [facility];  Service: Endoscopy;;   TONSILLECTOMY     TOTAL  KNEE ARTHROPLASTY Right 05/30/2019   Procedure: RIGHT TOTAL KNEE ARTHROPLASTY;  Surgeon: Mcarthur Rossetti, MD;  Location: [place];  Service: Orthopedics;  Laterality: Right;   TOTAL KNEE ARTHROPLASTY Left 10/17/2019   TOTAL KNEE ARTHROPLASTY Left 10/17/2019   Procedure: LEFT TOTAL KNEE ARTHROPLASTY;  Surgeon: Mcarthur Rossetti, MD;  Location: [place];  Service: Orthopedics;  Laterality: Left;    There were no vitals filed for this visit.    Subjective Assessment -  06/17/21 0955     Subjective Patient presents to physical therapy with complaint of bilateral hip pain. She reports history of 5 lumbar surgeries and bilateral knee replacements. She says she feels her body is very tight and "locked up". Notes weakness in hips as well. Notes intermittent tingling and burning in legs. Prolonged WB aggravates symptoms. Is limited in how long she can stand.    Pertinent History 5 x lumbar surgery, bilateral TKA    Limitations Standing;Walking;House hold activities    How long can you stand comfortably? about 10 minutes    How long can you walk comfortably? 5-10 minutes    Diagnostic tests MRI (-) stenosis    Patient Stated Goals Be able to walk normal again    Currently in Pain? Yes    Pain Score 4     Pain Location Back    Pain Orientation Posterior;Lower    Pain Descriptors / Indicators Aching    Pain Type Chronic pain    Pain Onset More than a month ago    Pain Frequency Constant    Aggravating Factors  standing, walking    Pain Relieving Factors Meds    Effect of Pain on Daily Activities Limits                [facility] PT Assessment - 06/17/21 0001       Assessment   Medical Diagnosis Bilateral hip pain    Referring Provider (PT) Basil Dess MD    Prior Therapy Yes      Balance Screen   Has the patient fallen in the past 6 months No      [place] residence    Living Arrangements Spouse/significant other      Prior Function   Level of Independence Independent      Cognition   Overall Cognitive Status Within Functional Limits for tasks assessed      Observation/Other Assessments   Focus on Therapeutic Outcomes (FOTO)  51% function      Posture/Postural Control   Posture/Postural Control Postural limitations    Postural Limitations Decreased lumbar lordosis      ROM / Strength   AROM / PROM / Strength Strength      Strength   Strength Assessment Site Hip;Knee;Ankle    Right/Left Hip  Right;Left    Right Hip Flexion 4/5    Right Hip Extension 3-/5    Right Hip ABduction 3+/5    Left Hip Flexion 4/5    Left Hip Extension 3-/5    Left Hip ABduction 4-/5    Right/Left Knee Right;Left    Right Knee Extension 4/5    Left Knee Extension 4+/5    Right/Left Ankle Right;Left    Right Ankle Dorsiflexion 4+/5    Left Ankle Dorsiflexion 5/5      Transfers   Five time sit to stand comments  12.75 sec with min use of UEs  Objective measurements completed on examination: See above findings.       [facility] Adult PT Treatment/Exercise - 06/17/21 0001       Exercises   Exercises Knee/Hip;Lumbar      Lumbar Exercises: Supine   Ab Set 5 reps    Bridge 5 reps      Lumbar Exercises: Sidelying   Clam 5 reps                    PT Education - 06/17/21 0958     Education Details on evaluation findings, POC and HEP    Person(s) Educated Patient    Methods Explanation;Handout    Comprehension Verbalized understanding              PT Short Term Goals - 06/17/21 1027       PT SHORT TERM GOAL #1   Title Patient will be independent with initial HEP and self-management strategies to improve functional outcomes    Time 2    Period Weeks    Status New    Target Date 07/01/21               PT Long Term Goals - 06/17/21 1115       PT LONG TERM GOAL #1   Title Patient will improve FOTO score to predicted value to indicate improvement in functional outcomes    Time 4    Period Weeks    Status New    Target Date 07/15/21      PT LONG TERM GOAL #2   Title Patient will report at least 75% overall improvement in subjective complaint to indicate improvement in ability to perform ADLs.    Time 4    Period Weeks    Status New    Target Date 07/15/21      PT LONG TERM GOAL #3   Title Patient will be able to stand/ walk up to 30 minutes with pain not to exceed 3/10 in lumbar/ hips to improve ability to perform  cooking/ shopping ADLs and exercise program    Time 4    Period Weeks    Status New    Target Date 07/15/21      PT LONG TERM GOAL #4   Title Patient will have equal to or > 4/5 MMT throughout BLE to improve ability to perform functional mobility, stair ambulation and ADLs.    Time 4    Period Weeks    Status New    Target Date 07/15/21                    Plan - 06/17/21 1025     Clinical Impression Statement Patient is a 75 y.o. female who presents to physical therapy with complaint of bilateral hip and LBP. Patient demonstrates decreased strength, postural deficits and gait abnormalities which are likely contributing to symptoms of pain and are negatively impacting patient ability to perform ADLs and functional mobility tasks. Patient will benefit from skilled physical therapy services to address these deficits to reduce pain, improve level of function with ADLs and functional mobility tasks.    Examination-Activity Limitations Locomotion Level;Transfers;Stairs;Stand;Lift    Examination-Participation Restrictions Community Activity;Laundry;Occupation;Cleaning;Yard Work;Meal Prep    Stability/Clinical Decision Making Stable/Uncomplicated    Clinical Decision Making Low    Rehab Potential Good    PT Frequency 2x / week    PT Duration 4 weeks    PT Treatment/Interventions ADLs/Self Care Home Management;Aquatic Therapy;Biofeedback;Gait training;DME Instruction;Patient/family education;Orthotic Fit/Training;Neuromuscular  re-education;Compression bandaging;Scar mobilization;Ultrasound;Parrafin;Fluidtherapy;Contrast Bath;Visual/perceptual remediation/compensation;Spinal Manipulations;Joint Manipulations;Dry needling;Passive range of motion;Energy conservation;Stair training;Functional mobility training;Splinting;Taping;Vasopneumatic Device;Therapeutic activities;Manual techniques;Manual lymph drainage;Therapeutic exercise;Traction;Moist Heat;Iontophoresis '4mg'$ /ml  Dexamethasone;Cryotherapy;Electrical Stimulation;Canalith Repostioning    PT Next Visit Plan Review goals and HEP. Progress hip and core strengthening as tolerated. Add bent knee raise, SLR with ab set, supine clam if sidelying not tolerated. Possibly hip flexor stretching as well    PT Home Exercise Plan Eval: bridge, ab set, clamshell    Consulted and Agree with Plan of Care Patient             Patient will benefit from skilled therapeutic intervention in order to improve the following deficits and impairments:  Decreased endurance, Abnormal gait, Decreased activity tolerance, Decreased strength, Pain, Impaired flexibility, Improper body mechanics, Difficulty walking, Decreased mobility  Visit Diagnosis: Low back pain, unspecified back pain laterality, unspecified chronicity, unspecified whether sciatica present  Pain in right hip  Pain in left hip     Problem List Patient Active Problem List   Diagnosis Date Noted   H/O adenomatous polyp of colon 02/24/2021   Melena 02/24/2021   Status post total left knee replacement 10/17/2019   Status post total right knee replacement 05/30/2019   Unilateral primary osteoarthritis, left knee 04/18/2019   Unilateral primary osteoarthritis, right knee 04/18/2019   CVA (cerebral vascular accident) ([place]) 08/04/2018   Stroke ([place]) 08/04/2018   Chronic back pain 08/04/2018   Post-operative pain 09/20/2017   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

## 2021-06-19 ENCOUNTER — Other Ambulatory Visit: Payer: Self-pay

## 2021-06-19 ENCOUNTER — Ambulatory Visit (HOSPITAL_COMMUNITY): Payer: Medicare Other | Admitting: Physical Therapy

## 2021-06-19 DIAGNOSIS — R262 Difficulty in walking, not elsewhere classified: Secondary | ICD-10-CM

## 2021-06-19 DIAGNOSIS — M25552 Pain in left hip: Secondary | ICD-10-CM | POA: Diagnosis not present

## 2021-06-19 DIAGNOSIS — M25551 Pain in right hip: Secondary | ICD-10-CM

## 2021-06-19 DIAGNOSIS — M545 Low back pain, unspecified: Secondary | ICD-10-CM | POA: Diagnosis not present

## 2021-06-19 NOTE — Therapy (Signed)
[place], [place], [address] Phone: [phone]   Fax:  [phone]  Physical Therapy Treatment  Patient Details  Name: [name] MRN: [id number] Date of Birth: 1946-07-11 Referring Provider (PT): Basil Dess MD   Encounter Date: 06/19/2021   PT End of Session - 06/19/21 1002     Visit Number 2    Number of Visits 8    Date for PT Re-Evaluation 07/15/21    Authorization Type [employer]    PT Start Time 0916    PT Stop Time 0958    PT Time Calculation (min) 42 min    Activity Tolerance Patient tolerated treatment well    Behavior During Therapy [facility] for tasks assessed/performed             Past Medical History:  Diagnosis Date   Arthritis    Cataracts, bilateral    surgery to remove cataracts - bilateral   Chronic back pain    Hypertension    Stroke [facility]) 2019   Wears dentures     Past Surgical History:  Procedure Laterality Date   ABDOMINAL HYSTERECTOMY  41   APPENDECTOMY     BACK SURGERY  2010   BACK SURGERY  2015   BACK SURGERY  06/2016   BIOPSY  03/11/2021   Procedure: BIOPSY;  Surgeon: Eloise Harman, DO;  [facility];  Service: Endoscopy;;   BIOPSY  05/06/2021   Procedure: BIOPSY;  Surgeon: Eloise Harman, DO;  [facility];  Service: Endoscopy;;   CATARACT EXTRACTION W/PHACO Left 01/07/2017   Procedure: CATARACT EXTRACTION PHACO AND INTRAOCULAR LENS PLACEMENT ([place]);  Surgeon: Tonny Branch, MD;  [facility]: [facility];  Service: Ophthalmology;  Laterality: Left;  left CDE 9.31    CATARACT EXTRACTION W/PHACO Right 01/25/2017   Procedure: CATARACT EXTRACTION PHACO AND INTRAOCULAR LENS PLACEMENT ([place]) CDE:16.07;  Surgeon: Tonny Branch, MD;  [facility];  Service: Ophthalmology;  Laterality: Right;  right   COLONOSCOPY  04/2008   Surgeon: Dr. Gala Romney; anal papilla, internal hemorrhoids, 5 mm polyp and diminutive polyp removed.  Pathology with 1 tubular adenoma and  benign colonic mucosa with focal surface erosion.  Recommend repeat colonoscopy in 5 years.   COLONOSCOPY WITH PROPOFOL N/A 03/11/2021   Procedure: COLONOSCOPY WITH PROPOFOL;  Surgeon: Eloise Harman, DO;  [facility];  Service: Endoscopy;  Laterality: N/A;  PM   ESOPHAGOGASTRODUODENOSCOPY  04/2008   Surgeon: Dr. Gala Romney; circumferential distal esophageal erosions consistent with nonerosive reflux esophagitis, small hiatal hernia, antral erosions, area of scar versus adenomatous change s/p biopsy (reactive gastropathy, negative H. pylori), normal examined duodenum.   ESOPHAGOGASTRODUODENOSCOPY (EGD) WITH PROPOFOL N/A 03/11/2021   Procedure: ESOPHAGOGASTRODUODENOSCOPY (EGD) WITH PROPOFOL;  Surgeon: Eloise Harman, DO;  [facility];  Service: Endoscopy;  Laterality: N/A;   ESOPHAGOGASTRODUODENOSCOPY (EGD) WITH PROPOFOL N/A 05/06/2021   Procedure: ESOPHAGOGASTRODUODENOSCOPY (EGD) WITH PROPOFOL;  Surgeon: Eloise Harman, DO;  [facility];  Service: Endoscopy;  Laterality: N/A;  7:30am   FRACTURE SURGERY Right    arm  1974   LUMBAR WOUND DEBRIDEMENT N/A 09/23/2017   Procedure: Exploration of Lumbar Wound; Repair of Pseudomeningocele;  Surgeon: Newman Pies, MD;  Location: [place];  Service: Neurosurgery;  Laterality: N/A;   POLYPECTOMY  03/11/2021   Procedure: POLYPECTOMY INTESTINAL;  Surgeon: Eloise Harman, DO;  [facility];  Service: Endoscopy;;   TONSILLECTOMY     TOTAL  KNEE ARTHROPLASTY Right 05/30/2019   Procedure: RIGHT TOTAL KNEE ARTHROPLASTY;  Surgeon: Mcarthur Rossetti, MD;  Location: [place];  Service: Orthopedics;  Laterality: Right;   TOTAL KNEE ARTHROPLASTY Left 10/17/2019   TOTAL KNEE ARTHROPLASTY Left 10/17/2019   Procedure: LEFT TOTAL KNEE ARTHROPLASTY;  Surgeon: Mcarthur Rossetti, MD;  Location: [place];  Service: Orthopedics;  Laterality: Left;    There were no vitals filed for this visit.   Subjective Assessment -  06/19/21 0929     Subjective Pt states her main thing she wants to do is return to her bed because she's sleeping in her recliner.  States her legs go numb when she lays down in her bed.  STates the medicine the MD put her on is helping some.  Currently 4/10 pain in her lower back.    Currently in Pain? Yes    Pain Score 4     Pain Location Back    Pain Orientation Lower;Posterior    Pain Descriptors / Indicators Aching;Sore                               [facility] Adult PT Treatment/Exercise - 06/19/21 0001       Lumbar Exercises: Stretches   Other Lumbar Stretch Exercise standing hip excursions 10X each way    Other Lumbar Stretch Exercise UE flexion facing wall and arch/retract 10X5"      Lumbar Exercises: Supine   Ab Set 10 reps    Bent Knee Raise 10 reps    Bent Knee Raise Limitations with abd bracing    Bridge 10 reps    Straight Leg Raise 10 reps    Straight Leg Raises Limitations with abdominal stab      Lumbar Exercises: Sidelying   Clam 10 reps    Clam Limitations 5 second holds      Knee/Hip Exercises: Seated   Sit to Sand 10 reps;without UE support                      PT Short Term Goals - 06/19/21 0930       PT SHORT TERM GOAL #1   Title Patient will be independent with initial HEP and self-management strategies to improve functional outcomes    Time 2    Period Weeks    Status On-going    Target Date 07/01/21               PT Long Term Goals - 06/19/21 0931       PT LONG TERM GOAL #1   Title Patient will improve FOTO score to predicted value to indicate improvement in functional outcomes    Time 4    Period Weeks    Status On-going      PT LONG TERM GOAL #2   Title Patient will report at least 75% overall improvement in subjective complaint to indicate improvement in ability to perform ADLs.    Time 4    Period Weeks    Status On-going      PT LONG TERM GOAL #3   Title Patient will be able to stand/ walk up  to 30 minutes with pain not to exceed 3/10 in lumbar/ hips to improve ability to perform cooking/ shopping ADLs and exercise program    Time 4    Period Weeks    Status On-going      PT LONG TERM GOAL #4  Title Patient will have equal to or > 4/5 MMT throughout BLE to improve ability to perform functional mobility, stair ambulation and ADLs.    Time 4    Period Weeks    Status On-going                   Plan - 06/19/21 1108     Clinical Impression Statement Goals, HEP and POC reviewed with patient. Began with core stability with cues needed for hold times, breathing and abdominal bracing with LE therex.  Pt foward bent with ambulation requiring cues and added UE arches off wall to improve retraction strength.   Pt able to complete all without complaints of pain or issues.  Sidelying clams most difficult to maintain stability due to rocking backward.    Examination-Activity Limitations Locomotion Level;Transfers;Stairs;Stand;Lift    Examination-Participation Restrictions Community Activity;Laundry;Occupation;Cleaning;Yard Work;Meal Prep    Stability/Clinical Decision Making Stable/Uncomplicated    Rehab Potential Good    PT Frequency 2x / week    PT Duration 4 weeks    PT Treatment/Interventions ADLs/Self Care Home Management;Aquatic Therapy;Biofeedback;Gait training;DME Instruction;Patient/family education;Orthotic Fit/Training;Neuromuscular re-education;Compression bandaging;Scar mobilization;Ultrasound;Parrafin;Fluidtherapy;Contrast Bath;Visual/perceptual remediation/compensation;Spinal Manipulations;Joint Manipulations;Dry needling;Passive range of motion;Energy conservation;Stair training;Functional mobility training;Splinting;Taping;Vasopneumatic Device;Therapeutic activities;Manual techniques;Manual lymph drainage;Therapeutic exercise;Traction;Moist Heat;Iontophoresis '4mg'$ /ml Dexamethasone;Cryotherapy;Electrical Stimulation;Canalith Repostioning    PT Next Visit Plan Progress  hip and core strengthening as tolerated.  Work on gait taking larger steps and upright posturing.  Progress to postural strengthening.    PT Home Exercise Plan Eval: bridge, ab set, clamshell    Consulted and Agree with Plan of Care Patient             Patient will benefit from skilled therapeutic intervention in order to improve the following deficits and impairments:  Decreased endurance, Abnormal gait, Decreased activity tolerance, Decreased strength, Pain, Impaired flexibility, Improper body mechanics, Difficulty walking, Decreased mobility  Visit Diagnosis: Low back pain, unspecified back pain laterality, unspecified chronicity, unspecified whether sciatica present  Pain in right hip  Pain in left hip  Difficulty in walking, not elsewhere classified     Problem List Patient Active Problem List   Diagnosis Date Noted   H/O adenomatous polyp of colon 02/24/2021   Melena 02/24/2021   Status post total left knee replacement 10/17/2019   Status post total right knee replacement 05/30/2019   Unilateral primary osteoarthritis, left knee 04/18/2019   Unilateral primary osteoarthritis, right knee 04/18/2019   CVA (cerebral vascular accident) ([place]) 08/04/2018   Stroke ([place]) 08/04/2018   Chronic back pain 08/04/2018   Post-operative pain 09/20/2017   Postprocedural pseudomeningocele 09/20/2017   Lumbar stenosis with neurogenic claudication 02/04/2015   Stiffness of joints, not elsewhere classified, multiple sites 10/27/2012   Weakness of both legs 10/27/2012   Difficulty in walking(719.7) 10/27/2012   Teena Irani, PTA/CLT [phone]  Teena Irani 06/19/2021, 11:10 AM  [place], [place], [address] Phone: [phone]   Fax:  [phone]  Name: [name] MRN: [id number] Date of Birth: [birth year]

## 2021-06-23 ENCOUNTER — Other Ambulatory Visit: Payer: Self-pay

## 2021-06-23 ENCOUNTER — Ambulatory Visit (HOSPITAL_COMMUNITY): Payer: Medicare Other | Admitting: Physical Therapy

## 2021-06-23 DIAGNOSIS — M25552 Pain in left hip: Secondary | ICD-10-CM | POA: Diagnosis not present

## 2021-06-23 DIAGNOSIS — M545 Low back pain, unspecified: Secondary | ICD-10-CM | POA: Diagnosis not present

## 2021-06-23 DIAGNOSIS — M25551 Pain in right hip: Secondary | ICD-10-CM | POA: Diagnosis not present

## 2021-06-23 DIAGNOSIS — R262 Difficulty in walking, not elsewhere classified: Secondary | ICD-10-CM | POA: Diagnosis not present

## 2021-06-23 NOTE — Therapy (Signed)
[place], [place], [address] Phone: [phone]   Fax:  [phone]  Physical Therapy Treatment  Patient Details  Name: [name] MRN: [id number] Date of Birth: 07/19/1946 Referring Provider (PT): Basil Dess MD   Encounter Date: 06/23/2021   PT End of Session - 06/23/21 1142     Visit Number 3    Number of Visits 8    Date for PT Re-Evaluation 07/15/21    Authorization Type [employer]    PT Start Time 1130    PT Stop Time [id number]    PT Time Calculation (min) 45 min    Activity Tolerance Patient tolerated treatment well    Behavior During Therapy [facility] for tasks assessed/performed             Past Medical History:  Diagnosis Date   Arthritis    Cataracts, bilateral    surgery to remove cataracts - bilateral   Chronic back pain    Hypertension    Stroke [facility]) 2019   Wears dentures     Past Surgical History:  Procedure Laterality Date   ABDOMINAL HYSTERECTOMY  47   APPENDECTOMY     BACK SURGERY  2010   BACK SURGERY  2015   BACK SURGERY  06/2016   BIOPSY  03/11/2021   Procedure: BIOPSY;  Surgeon: Eloise Harman, DO;  [facility];  Service: Endoscopy;;   BIOPSY  05/06/2021   Procedure: BIOPSY;  Surgeon: Eloise Harman, DO;  [facility];  Service: Endoscopy;;   CATARACT EXTRACTION W/PHACO Left 01/07/2017   Procedure: CATARACT EXTRACTION PHACO AND INTRAOCULAR LENS PLACEMENT ([place]);  Surgeon: Tonny Branch, MD;  [facility];  Service: Ophthalmology;  Laterality: Left;  left CDE 9.31    CATARACT EXTRACTION W/PHACO Right 01/25/2017   Procedure: CATARACT EXTRACTION PHACO AND INTRAOCULAR LENS PLACEMENT ([place]) CDE:16.07;  Surgeon: Tonny Branch, MD;  [facility];  Service: Ophthalmology;  Laterality: Right;  right   COLONOSCOPY  04/2008   Surgeon: Dr. Gala Romney; anal papilla, internal hemorrhoids, 5 mm polyp and diminutive polyp removed.  Pathology with 1 tubular adenoma and  benign colonic mucosa with focal surface erosion.  Recommend repeat colonoscopy in 5 years.   COLONOSCOPY WITH PROPOFOL N/A 03/11/2021   Procedure: COLONOSCOPY WITH PROPOFOL;  Surgeon: Eloise Harman, DO;  [facility];  Service: Endoscopy;  Laterality: N/A;  PM   ESOPHAGOGASTRODUODENOSCOPY  04/2008   Surgeon: Dr. Gala Romney; circumferential distal esophageal erosions consistent with nonerosive reflux esophagitis, small hiatal hernia, antral erosions, area of scar versus adenomatous change s/p biopsy (reactive gastropathy, negative H. pylori), normal examined duodenum.   ESOPHAGOGASTRODUODENOSCOPY (EGD) WITH PROPOFOL N/A 03/11/2021   Procedure: ESOPHAGOGASTRODUODENOSCOPY (EGD) WITH PROPOFOL;  Surgeon: Eloise Harman, DO;  [facility];  Service: Endoscopy;  Laterality: N/A;   ESOPHAGOGASTRODUODENOSCOPY (EGD) WITH PROPOFOL N/A 05/06/2021   Procedure: ESOPHAGOGASTRODUODENOSCOPY (EGD) WITH PROPOFOL;  Surgeon: Eloise Harman, DO;  [facility];  Service: Endoscopy;  Laterality: N/A;  7:30am   FRACTURE SURGERY Right    arm  1974   LUMBAR WOUND DEBRIDEMENT N/A 09/23/2017   Procedure: Exploration of Lumbar Wound; Repair of Pseudomeningocele;  Surgeon: Newman Pies, MD;  Location: [place];  Service: Neurosurgery;  Laterality: N/A;   POLYPECTOMY  03/11/2021   Procedure: POLYPECTOMY INTESTINAL;  Surgeon: Eloise Harman, DO;  [facility];  Service: Endoscopy;;   TONSILLECTOMY     TOTAL  KNEE ARTHROPLASTY Right 05/30/2019   Procedure: RIGHT TOTAL KNEE ARTHROPLASTY;  Surgeon: Mcarthur Rossetti, MD;  Location: [place];  Service: Orthopedics;  Laterality: Right;   TOTAL KNEE ARTHROPLASTY Left 10/17/2019   TOTAL KNEE ARTHROPLASTY Left 10/17/2019   Procedure: LEFT TOTAL KNEE ARTHROPLASTY;  Surgeon: Mcarthur Rossetti, MD;  Location: [place];  Service: Orthopedics;  Laterality: Left;    There were no vitals filed for this visit.   Subjective Assessment -  06/23/21 1134     Subjective Pt states that the pain is equal on both sides of her back but she can tell that her Rt leg is weaker.    Pertinent History 5 x lumbar surgery, bilateral TKA    Limitations Standing;Walking;House hold activities    How long can you stand comfortably? about 10 minutes    How long can you walk comfortably? 5-10 minutes    Diagnostic tests MRI (-) stenosis    Pain Score 4     Pain Location Back    Pain Orientation Lower    Pain Descriptors / Indicators Aching    Pain Type Chronic pain    Pain Onset More than a month ago    Pain Frequency Constant    Aggravating Factors  weight bearing    Pain Relieving Factors meds    Effect of Pain on Daily Activities limits                               [facility] Adult PT Treatment/Exercise - 06/23/21 0001       Exercises   Exercises Lumbar      Lumbar Exercises: Stretches   Single Knee to Chest Stretch 3 reps;30 seconds    Standing Extension 10 reps      Lumbar Exercises: Standing   Heel Raises 10 reps    Functional Squats 10 reps      Lumbar Exercises: Seated   Other Seated Lumbar Exercises good posture with abset and sxapular retraction x 10      Lumbar Exercises: Supine   Bent Knee Raise Limitations --    Dead Bug 10 reps    Bridge 15 reps    Straight Leg Raise 10 reps    Straight Leg Raises Limitations with abdominal stab      Lumbar Exercises: Sidelying   Clam 15 reps    Hip Abduction 10 reps                    PT Education - 06/23/21 1142     Education Details proper sitting posture, body mechanics for getting in and out of bed.    Person(s) Educated Patient    Methods Explanation    Comprehension Verbalized understanding              PT Short Term Goals - 06/19/21 0930       PT SHORT TERM GOAL #1   Title Patient will be independent with initial HEP and self-management strategies to improve functional outcomes    Time 2    Period Weeks    Status  On-going    Target Date 07/01/21               PT Long Term Goals - 06/19/21 0931       PT LONG TERM GOAL #1   Title Patient will improve FOTO score to predicted value to indicate improvement in functional outcomes    Time  4    Period Weeks    Status On-going      PT LONG TERM GOAL #2   Title Patient will report at least 75% overall improvement in subjective complaint to indicate improvement in ability to perform ADLs.    Time 4    Period Weeks    Status On-going      PT LONG TERM GOAL #3   Title Patient will be able to stand/ walk up to 30 minutes with pain not to exceed 3/10 in lumbar/ hips to improve ability to perform cooking/ shopping ADLs and exercise program    Time 4    Period Weeks    Status On-going      PT LONG TERM GOAL #4   Title Patient will have equal to or > 4/5 MMT throughout BLE to improve ability to perform functional mobility, stair ambulation and ADLs.    Time 4    Period Weeks    Status On-going                   Plan - 06/23/21 1144     Clinical Impression Statement Reviewed proper body mechanics for bed mobility. Added proper sitting posture and stretches to HEP. Progressed to deadbug from bent knee lift, added standing stabilization exercises.   PT exhibits improved ability to stabilize with clams.    Examination-Activity Limitations Locomotion Level;Transfers;Stairs;Stand;Lift    Examination-Participation Restrictions Community Activity;Laundry;Occupation;Cleaning;Yard Work;Meal Prep    Stability/Clinical Decision Making Stable/Uncomplicated    Rehab Potential Good    PT Frequency 2x / week    PT Duration 4 weeks    PT Treatment/Interventions ADLs/Self Care Home Management;Aquatic Therapy;Biofeedback;Gait training;DME Instruction;Patient/family education;Orthotic Fit/Training;Neuromuscular re-education;Compression bandaging;Scar mobilization;Ultrasound;Parrafin;Fluidtherapy;Contrast Bath;Visual/perceptual  remediation/compensation;Spinal Manipulations;Joint Manipulations;Dry needling;Passive range of motion;Energy conservation;Stair training;Functional mobility training;Splinting;Taping;Vasopneumatic Device;Therapeutic activities;Manual techniques;Manual lymph drainage;Therapeutic exercise;Traction;Moist Heat;Iontophoresis '4mg'$ /ml Dexamethasone;Cryotherapy;Electrical Stimulation;Canalith Repostioning    PT Next Visit Plan Progress hip and core strengthening as tolerated.  Work on gait taking larger steps and upright posturing.  Progress to postural strengthening.    PT Home Exercise Plan Eval: bridge, ab set, clamshell;8/8:  standing extension, knee to chest and sitting tall    Consulted and Agree with Plan of Care Patient             Patient will benefit from skilled therapeutic intervention in order to improve the following deficits and impairments:  Decreased endurance, Abnormal gait, Decreased activity tolerance, Decreased strength, Pain, Impaired flexibility, Improper body mechanics, Difficulty walking, Decreased mobility  Visit Diagnosis: Low back pain, unspecified back pain laterality, unspecified chronicity, unspecified whether sciatica present  Pain in right hip  Pain in left hip  Difficulty in walking, not elsewhere classified     Problem List Patient Active Problem List   Diagnosis Date Noted   H/O adenomatous polyp of colon 02/24/2021   Melena 02/24/2021   Status post total left knee replacement 10/17/2019   Status post total right knee replacement 05/30/2019   Unilateral primary osteoarthritis, left knee 04/18/2019   Unilateral primary osteoarthritis, right knee 04/18/2019   CVA (cerebral vascular accident) ([place]) 08/04/2018   Stroke ([place]) 08/04/2018   Chronic back pain 08/04/2018   Post-operative pain 09/20/2017   Postprocedural pseudomeningocele 09/20/2017   Lumbar stenosis with neurogenic claudication 02/04/2015   Stiffness of joints, not elsewhere classified,  multiple sites 10/27/2012   Weakness of both legs 10/27/2012   Difficulty in walking(719.7) 10/27/2012    Rayetta Humphrey, PT CLT [phone]  06/23/2021, 12:09 PM  [facility]  [place], [place], [address] Phone: [phone]   Fax:  [phone]  Name: [name] MRN: [id number] Date of Birth: 1945-11-26

## 2021-06-26 ENCOUNTER — Encounter (HOSPITAL_COMMUNITY): Payer: Self-pay | Admitting: Physical Therapy

## 2021-06-26 ENCOUNTER — Ambulatory Visit (HOSPITAL_COMMUNITY): Payer: Medicare Other | Admitting: Physical Therapy

## 2021-06-26 ENCOUNTER — Other Ambulatory Visit: Payer: Self-pay

## 2021-06-26 DIAGNOSIS — M25552 Pain in left hip: Secondary | ICD-10-CM

## 2021-06-26 DIAGNOSIS — M545 Low back pain, unspecified: Secondary | ICD-10-CM

## 2021-06-26 DIAGNOSIS — R262 Difficulty in walking, not elsewhere classified: Secondary | ICD-10-CM

## 2021-06-26 DIAGNOSIS — M25551 Pain in right hip: Secondary | ICD-10-CM | POA: Diagnosis not present

## 2021-06-26 NOTE — Therapy (Signed)
[place], [place], [address] Phone: [phone]   Fax:  [phone]  Physical Therapy Treatment  Patient Details  Name: [name] MRN: [id number] Date of Birth: December 23, 1945 Referring Provider (PT): Basil Dess MD   Encounter Date: 06/26/2021   PT End of Session - 06/26/21 0746     Visit Number 4    Number of Visits 8    Date for PT Re-Evaluation 07/15/21    Authorization Type [employer]    PT Start Time 0745    PT Stop Time 0824    PT Time Calculation (min) 39 min    Activity Tolerance Patient tolerated treatment well    Behavior During Therapy [facility] for tasks assessed/performed             Past Medical History:  Diagnosis Date   Arthritis    Cataracts, bilateral    surgery to remove cataracts - bilateral   Chronic back pain    Hypertension    Stroke [facility]) 2019   Wears dentures     Past Surgical History:  Procedure Laterality Date   ABDOMINAL HYSTERECTOMY  86   APPENDECTOMY     BACK SURGERY  2010   BACK SURGERY  2015   BACK SURGERY  06/2016   BIOPSY  03/11/2021   Procedure: BIOPSY;  Surgeon: Eloise Harman, DO;  [facility];  Service: Endoscopy;;   BIOPSY  05/06/2021   Procedure: BIOPSY;  Surgeon: Eloise Harman, DO;  [facility];  Service: Endoscopy;;   CATARACT EXTRACTION W/PHACO Left 01/07/2017   Procedure: CATARACT EXTRACTION PHACO AND INTRAOCULAR LENS PLACEMENT ([place]);  Surgeon: Tonny Branch, MD;  Location: [facility];  Service: Ophthalmology;  Laterality: Left;  left CDE 9.31    CATARACT EXTRACTION W/PHACO Right 01/25/2017   Procedure: CATARACT EXTRACTION PHACO AND INTRAOCULAR LENS PLACEMENT ([place]) CDE:16.07;  Surgeon: Tonny Branch, MD;  Location: [facility];  Service: Ophthalmology;  Laterality: Right;  right   COLONOSCOPY  04/2008   Surgeon: Dr. Gala Romney; anal papilla, internal hemorrhoids, 5 mm polyp and diminutive polyp removed.  Pathology with 1 tubular adenoma and  benign colonic mucosa with focal surface erosion.  Recommend repeat colonoscopy in 5 years.   COLONOSCOPY WITH PROPOFOL N/A 03/11/2021   Procedure: COLONOSCOPY WITH PROPOFOL;  Surgeon: Eloise Harman, DO;  [facility];  Service: Endoscopy;  Laterality: N/A;  PM   ESOPHAGOGASTRODUODENOSCOPY  04/2008   Surgeon: Dr. Gala Romney; circumferential distal esophageal erosions consistent with nonerosive reflux esophagitis, small hiatal hernia, antral erosions, area of scar versus adenomatous change s/p biopsy (reactive gastropathy, negative H. pylori), normal examined duodenum.   ESOPHAGOGASTRODUODENOSCOPY (EGD) WITH PROPOFOL N/A 03/11/2021   Procedure: ESOPHAGOGASTRODUODENOSCOPY (EGD) WITH PROPOFOL;  Surgeon: Eloise Harman, DO;  [facility];  Service: Endoscopy;  Laterality: N/A;   ESOPHAGOGASTRODUODENOSCOPY (EGD) WITH PROPOFOL N/A 05/06/2021   Procedure: ESOPHAGOGASTRODUODENOSCOPY (EGD) WITH PROPOFOL;  Surgeon: Eloise Harman, DO;  [facility];  Service: Endoscopy;  Laterality: N/A;  7:30am   FRACTURE SURGERY Right    arm  1974   LUMBAR WOUND DEBRIDEMENT N/A 09/23/2017   Procedure: Exploration of Lumbar Wound; Repair of Pseudomeningocele;  Surgeon: Newman Pies, MD;  Location: [place];  Service: Neurosurgery;  Laterality: N/A;   POLYPECTOMY  03/11/2021   Procedure: POLYPECTOMY INTESTINAL;  Surgeon: Eloise Harman, DO;  [facility];  Service: Endoscopy;;   TONSILLECTOMY     TOTAL  KNEE ARTHROPLASTY Right 05/30/2019   Procedure: RIGHT TOTAL KNEE ARTHROPLASTY;  Surgeon: Mcarthur Rossetti, MD;  Location: [place];  Service: Orthopedics;  Laterality: Right;   TOTAL KNEE ARTHROPLASTY Left 10/17/2019   TOTAL KNEE ARTHROPLASTY Left 10/17/2019   Procedure: LEFT TOTAL KNEE ARTHROPLASTY;  Surgeon: Mcarthur Rossetti, MD;  Location: [place];  Service: Orthopedics;  Laterality: Left;    There were no vitals filed for this visit.   Subjective Assessment -  06/26/21 0750     Subjective States she 3/10 in her back states she feels stronger.    Pertinent History 5 x lumbar surgery, bilateral TKA    Limitations Standing;Walking;House hold activities    How long can you stand comfortably? about 10 minutes    How long can you walk comfortably? 5-10 minutes    Diagnostic tests MRI (-) stenosis    Currently in Pain? Yes    Pain Score 3     Pain Location Back    Pain Orientation Lower    Pain Descriptors / Indicators Aching    Pain Type Chronic pain    Pain Onset More than a month ago                [facility] PT Assessment - 06/26/21 0001       Assessment   Medical Diagnosis Bilateral hip pain    Referring Provider (PT) Basil Dess MD                           [facility] Adult PT Treatment/Exercise - 06/26/21 0001       Lumbar Exercises: Stretches   Single Knee to Chest Stretch 10 seconds;Left;Right   x10   Lower Trunk Rotation 60 seconds;3 reps      Lumbar Exercises: Seated   Sit to Stand 10 reps   x3 sets, no UE support     Lumbar Exercises: Supine   Bridge 10 reps;2 seconds   x3 sets   Other Supine Lumbar Exercises deadbugs isometric with ball 3x5 5" holds    Other Supine Lumbar Exercises hook lying hamstring isometric 3x10 5" holds                      PT Short Term Goals - 06/19/21 0930       PT SHORT TERM GOAL #1   Title Patient will be independent with initial HEP and self-management strategies to improve functional outcomes    Time 2    Period Weeks    Status On-going    Target Date 07/01/21               PT Long Term Goals - 06/19/21 0931       PT LONG TERM GOAL #1   Title Patient will improve FOTO score to predicted value to indicate improvement in functional outcomes    Time 4    Period Weeks    Status On-going      PT LONG TERM GOAL #2   Title Patient will report at least 75% overall improvement in subjective complaint to indicate improvement in ability to perform ADLs.     Time 4    Period Weeks    Status On-going      PT LONG TERM GOAL #3   Title Patient will be able to stand/ walk up to 30 minutes with pain not to exceed 3/10 in lumbar/ hips to improve ability to perform cooking/ shopping ADLs  and exercise program    Time 4    Period Weeks    Status On-going      PT LONG TERM GOAL #4   Title Patient will have equal to or > 4/5 MMT throughout BLE to improve ability to perform functional mobility, stair ambulation and ADLs.    Time 4    Period Weeks    Status On-going                   Plan - 06/26/21 0816     Clinical Impression Statement Patient tolerated session well. Fatigue noted but no pain. Lumbar trunk rotation with knees to left resulted in pull on right side. Added this exercise to HEP as improved mobility noted with repetition. Verbal cues for form throughout session and cues for patient to breath during exercises. Patient demonstrated good log roll with transition from sit to supine during session.    Examination-Activity Limitations Locomotion Level;Transfers;Stairs;Stand;Lift    Examination-Participation Restrictions Community Activity;Laundry;Occupation;Cleaning;Yard Work;Meal Prep    Stability/Clinical Decision Making Stable/Uncomplicated    Rehab Potential Good    PT Frequency 2x / week    PT Duration 4 weeks    PT Treatment/Interventions ADLs/Self Care Home Management;Aquatic Therapy;Biofeedback;Gait training;DME Instruction;Patient/family education;Orthotic Fit/Training;Neuromuscular re-education;Compression bandaging;Scar mobilization;Ultrasound;Parrafin;Fluidtherapy;Contrast Bath;Visual/perceptual remediation/compensation;Spinal Manipulations;Joint Manipulations;Dry needling;Passive range of motion;Energy conservation;Stair training;Functional mobility training;Splinting;Taping;Vasopneumatic Device;Therapeutic activities;Manual techniques;Manual lymph drainage;Therapeutic exercise;Traction;Moist Heat;Iontophoresis '4mg'$ /ml  Dexamethasone;Cryotherapy;Electrical Stimulation;Canalith Repostioning    PT Next Visit Plan Progress hip and core strengthening as tolerated.  Work on gait taking larger steps and upright posturing.  Progress to postural strengthening.    PT Home Exercise Plan Eval: bridge, ab set, clamshell;8/8:  standing extension, knee to chest and sitting tall; 8/11 LTR    Consulted and Agree with Plan of Care Patient             Patient will benefit from skilled therapeutic intervention in order to improve the following deficits and impairments:  Decreased endurance, Abnormal gait, Decreased activity tolerance, Decreased strength, Pain, Impaired flexibility, Improper body mechanics, Difficulty walking, Decreased mobility  Visit Diagnosis: Low back pain, unspecified back pain laterality, unspecified chronicity, unspecified whether sciatica present  Pain in right hip  Pain in left hip  Difficulty in walking, not elsewhere classified     Problem List Patient Active Problem List   Diagnosis Date Noted   H/O adenomatous polyp of colon 02/24/2021   Melena 02/24/2021   Status post total left knee replacement 10/17/2019   Status post total right knee replacement 05/30/2019   Unilateral primary osteoarthritis, left knee 04/18/2019   Unilateral primary osteoarthritis, right knee 04/18/2019   CVA (cerebral vascular accident) ([place]) 08/04/2018   Stroke ([place]) 08/04/2018   Chronic back pain 08/04/2018   Post-operative pain 09/20/2017   Postprocedural pseudomeningocele 09/20/2017   Lumbar stenosis with neurogenic claudication 02/04/2015   Stiffness of joints, not elsewhere classified, multiple sites 10/27/2012   Weakness of both legs 10/27/2012   Difficulty in walking(719.7) 10/27/2012   8:24 AM, 06/26/21 Jerene Pitch, DPT Physical Therapy with [facility]  [phone] office   [place], [place],  [address] Phone: [phone]   Fax:  [phone]  Name: [name] MRN: [id number] Date of Birth: March 06, 1946

## 2021-06-30 ENCOUNTER — Ambulatory Visit: Payer: Medicare Other | Admitting: Orthopaedic Surgery

## 2021-06-30 ENCOUNTER — Encounter: Payer: Self-pay | Admitting: Orthopaedic Surgery

## 2021-06-30 ENCOUNTER — Other Ambulatory Visit: Payer: Self-pay

## 2021-06-30 VITALS — Ht 64.0 in | Wt 220.0 lb

## 2021-06-30 DIAGNOSIS — M545 Low back pain, unspecified: Secondary | ICD-10-CM | POA: Diagnosis not present

## 2021-06-30 NOTE — Progress Notes (Signed)
The patient is sent again to me to evaluate her right third.  She still denies any groin pain and has been dealing with significant right-sided low back pain that does radiate into the lateral aspect of her right hip and down her leg.  On exam I am able to easily put her right hip through internal and external rotation with no pain in the groin.  I compress her right hip and shows no pain in the groin either.  There is no significant pain over the trochanteric area.  X-rays from her this year showed just slight joint space narrowing of both hips and some small para-articular osteophytes but not significant and again her clinical exam does not show any hip pain.  When she first gets up she feels like the right leg is weak but it seems to be coming from her lumbar spine.  She has had extensive lumbar spine surgery.  She does have an appoint with Dr. Louanne Skye coming up in early September.  She may end up benefiting from an intervention by Dr. Ernestina Patches of the right side of her lumbar spine given her symptoms.  Based on her recent MRI of her lumbar spine and recent plain film showing the extent of her fusion, I am uncertain as to what to recommend in terms of where to inject on the right side.  I may need to defer this to Dr. Louanne Skye.  As of now, I do not feel that this is a hip issue.  It may be where we have Dr. Ernestina Patches perform an intra-articular steroid injection in her right hip and consider some type of right-sided epidural steroid injection.

## 2021-07-01 ENCOUNTER — Ambulatory Visit (HOSPITAL_COMMUNITY): Payer: Medicare Other | Admitting: Physical Therapy

## 2021-07-01 ENCOUNTER — Encounter (HOSPITAL_COMMUNITY): Payer: Self-pay | Admitting: Physical Therapy

## 2021-07-01 DIAGNOSIS — M25552 Pain in left hip: Secondary | ICD-10-CM

## 2021-07-01 DIAGNOSIS — M25551 Pain in right hip: Secondary | ICD-10-CM

## 2021-07-01 DIAGNOSIS — M545 Low back pain, unspecified: Secondary | ICD-10-CM

## 2021-07-01 DIAGNOSIS — R262 Difficulty in walking, not elsewhere classified: Secondary | ICD-10-CM

## 2021-07-01 NOTE — Therapy (Signed)
[place], [place], [address] Phone: [phone]   Fax:  [phone]  Physical Therapy Treatment  Patient Details  Name: [name] MRN: [id number] Date of Birth: August 28, 1946 Referring Provider (PT): Basil Dess MD   Encounter Date: 07/01/2021   PT End of Session - 07/01/21 0832     Visit Number 5    Number of Visits 8    Date for PT Re-Evaluation 07/15/21    Authorization Type [employer]    PT Start Time 0825   late check in   PT Stop Time 0903    PT Time Calculation (min) 38 min    Activity Tolerance Patient tolerated treatment well    Behavior During Therapy [facility] for tasks assessed/performed             Past Medical History:  Diagnosis Date   Arthritis    Cataracts, bilateral    surgery to remove cataracts - bilateral   Chronic back pain    Hypertension    Stroke [facility]) 2019   Wears dentures     Past Surgical History:  Procedure Laterality Date   ABDOMINAL HYSTERECTOMY  87   APPENDECTOMY     BACK SURGERY  2010   BACK SURGERY  2015   BACK SURGERY  06/2016   BIOPSY  03/11/2021   Procedure: BIOPSY;  Surgeon: Eloise Harman, DO;  [facility];  Service: Endoscopy;;   BIOPSY  05/06/2021   Procedure: BIOPSY;  Surgeon: Eloise Harman, DO;  [facility];  Service: Endoscopy;;   CATARACT EXTRACTION W/PHACO Left 01/07/2017   Procedure: CATARACT EXTRACTION PHACO AND INTRAOCULAR LENS PLACEMENT ([place]);  Surgeon: Tonny Branch, MD;  Location: [facility];  Service: Ophthalmology;  Laterality: Left;  left CDE 9.31    CATARACT EXTRACTION W/PHACO Right 01/25/2017   Procedure: CATARACT EXTRACTION PHACO AND INTRAOCULAR LENS PLACEMENT ([place]) CDE:16.07;  Surgeon: Tonny Branch, MD;  Location: [facility];  Service: Ophthalmology;  Laterality: Right;  right   COLONOSCOPY  04/2008   Surgeon: Dr. Gala Romney; anal papilla, internal hemorrhoids, 5 mm polyp and diminutive polyp removed.  Pathology with 1 tubular  adenoma and benign colonic mucosa with focal surface erosion.  Recommend repeat colonoscopy in 5 years.   COLONOSCOPY WITH PROPOFOL N/A 03/11/2021   Procedure: COLONOSCOPY WITH PROPOFOL;  Surgeon: Eloise Harman, DO;  [facility];  Service: Endoscopy;  Laterality: N/A;  PM   ESOPHAGOGASTRODUODENOSCOPY  04/2008   Surgeon: Dr. Gala Romney; circumferential distal esophageal erosions consistent with nonerosive reflux esophagitis, small hiatal hernia, antral erosions, area of scar versus adenomatous change s/p biopsy (reactive gastropathy, negative H. pylori), normal examined duodenum.   ESOPHAGOGASTRODUODENOSCOPY (EGD) WITH PROPOFOL N/A 03/11/2021   Procedure: ESOPHAGOGASTRODUODENOSCOPY (EGD) WITH PROPOFOL;  Surgeon: Eloise Harman, DO;  [facility];  Service: Endoscopy;  Laterality: N/A;   ESOPHAGOGASTRODUODENOSCOPY (EGD) WITH PROPOFOL N/A 05/06/2021   Procedure: ESOPHAGOGASTRODUODENOSCOPY (EGD) WITH PROPOFOL;  Surgeon: Eloise Harman, DO;  [facility];  Service: Endoscopy;  Laterality: N/A;  7:30am   FRACTURE SURGERY Right    arm  1974   LUMBAR WOUND DEBRIDEMENT N/A 09/23/2017   Procedure: Exploration of Lumbar Wound; Repair of Pseudomeningocele;  Surgeon: Newman Pies, MD;  Location: [place];  Service: Neurosurgery;  Laterality: N/A;   POLYPECTOMY  03/11/2021   Procedure: POLYPECTOMY INTESTINAL;  Surgeon: Eloise Harman, DO;  [facility];  Service: Endoscopy;;   TONSILLECTOMY  TOTAL KNEE ARTHROPLASTY Right 05/30/2019   Procedure: RIGHT TOTAL KNEE ARTHROPLASTY;  Surgeon: Mcarthur Rossetti, MD;  Location: [place];  Service: Orthopedics;  Laterality: Right;   TOTAL KNEE ARTHROPLASTY Left 10/17/2019   TOTAL KNEE ARTHROPLASTY Left 10/17/2019   Procedure: LEFT TOTAL KNEE ARTHROPLASTY;  Surgeon: Mcarthur Rossetti, MD;  Location: [place];  Service: Orthopedics;  Laterality: Left;    There were no vitals filed for this visit.   Subjective  Assessment - 07/01/21 0831     Subjective Patient says her exercises are going well. She saw MD yesterday and is being scheduled for injections in her back.    Pertinent History 5 x lumbar surgery, bilateral TKA    Limitations Standing;Walking;House hold activities    How long can you stand comfortably? about 10 minutes    How long can you walk comfortably? 5-10 minutes    Diagnostic tests MRI (-) stenosis    Currently in Pain? Yes    Pain Score 2     Pain Location Back    Pain Orientation Posterior;Lower    Pain Descriptors / Indicators Aching    Pain Type Chronic pain    Pain Onset More than a month ago    Pain Frequency Constant                               [facility] Adult PT Treatment/Exercise - 07/01/21 0001       Lumbar Exercises: Stretches   Single Knee to Chest Stretch Right;Left;5 reps;10 seconds    Lower Trunk Rotation 5 reps;10 seconds      Lumbar Exercises: Standing   Heel Raises 10 reps    Functional Squats 10 reps   2 sets   Functional Squats Limitations to chair for depth cue      Lumbar Exercises: Supine   Ab Set 10 reps;5 seconds    Bent Knee Raise 10 reps    Bridge 10 reps;3 seconds    Straight Leg Raise 10 reps    Straight Leg Raises Limitations with abdominal stab      Lumbar Exercises: Sidelying   Hip Abduction 10 reps      Knee/Hip Exercises: Standing   Hip Extension Both;1 set;10 reps                      PT Short Term Goals - 06/19/21 0930       PT SHORT TERM GOAL #1   Title Patient will be independent with initial HEP and self-management strategies to improve functional outcomes    Time 2    Period Weeks    Status On-going    Target Date 07/01/21               PT Long Term Goals - 06/19/21 0931       PT LONG TERM GOAL #1   Title Patient will improve FOTO score to predicted value to indicate improvement in functional outcomes    Time 4    Period Weeks    Status On-going      PT LONG TERM GOAL  #2   Title Patient will report at least 75% overall improvement in subjective complaint to indicate improvement in ability to perform ADLs.    Time 4    Period Weeks    Status On-going      PT LONG TERM GOAL #3   Title Patient will be able to stand/  walk up to 30 minutes with pain not to exceed 3/10 in lumbar/ hips to improve ability to perform cooking/ shopping ADLs and exercise program    Time 4    Period Weeks    Status On-going      PT LONG TERM GOAL #4   Title Patient will have equal to or > 4/5 MMT throughout BLE to improve ability to perform functional mobility, stair ambulation and ADLs.    Time 4    Period Weeks    Status On-going                   Plan - 07/01/21 [id number]     Clinical Impression Statement Patient tolerated session well today. Notes ongoing difficulty performing exercise on RL compared to LT. Cued patient on foot position during hip bridge and on core activation during leg raise series. Added standing hip extension. Cued patient on proper upright posturing during activity. Patient will continue to benefit from skilled therapy services to reduce deficits and improve functional ability.    Examination-Activity Limitations Locomotion Level;Transfers;Stairs;Stand;Lift    Examination-Participation Restrictions Community Activity;Laundry;Occupation;Cleaning;Yard Work;Meal Prep    Stability/Clinical Decision Making Stable/Uncomplicated    Rehab Potential Good    PT Frequency 2x / week    PT Duration 4 weeks    PT Treatment/Interventions ADLs/Self Care Home Management;Aquatic Therapy;Biofeedback;Gait training;DME Instruction;Patient/family education;Orthotic Fit/Training;Neuromuscular re-education;Compression bandaging;Scar mobilization;Ultrasound;Parrafin;Fluidtherapy;Contrast Bath;Visual/perceptual remediation/compensation;Spinal Manipulations;Joint Manipulations;Dry needling;Passive range of motion;Energy conservation;Stair training;Functional mobility  training;Splinting;Taping;Vasopneumatic Device;Therapeutic activities;Manual techniques;Manual lymph drainage;Therapeutic exercise;Traction;Moist Heat;Iontophoresis '4mg'$ /ml Dexamethasone;Cryotherapy;Electrical Stimulation;Canalith Repostioning    PT Next Visit Plan Progress hip and core strengthening as tolerated.  Work on gait taking larger steps and upright posturing.  Progress postural strengthening. Add bands when ready    PT Home Exercise Plan Eval: bridge, ab set, clamshell;8/8:  standing extension, knee to chest and sitting tall; 8/11 LTR 8/16 sit to stand, standing hip extension    Consulted and Agree with Plan of Care Patient             Patient will benefit from skilled therapeutic intervention in order to improve the following deficits and impairments:  Decreased endurance, Abnormal gait, Decreased activity tolerance, Decreased strength, Pain, Impaired flexibility, Improper body mechanics, Difficulty walking, Decreased mobility  Visit Diagnosis: Low back pain, unspecified back pain laterality, unspecified chronicity, unspecified whether sciatica present  Pain in right hip  Pain in left hip  Difficulty in walking, not elsewhere classified     Problem List Patient Active Problem List   Diagnosis Date Noted   H/O adenomatous polyp of colon 02/24/2021   Melena 02/24/2021   Status post total left knee replacement 10/17/2019   Status post total right knee replacement 05/30/2019   Unilateral primary osteoarthritis, left knee 04/18/2019   Unilateral primary osteoarthritis, right knee 04/18/2019   CVA (cerebral vascular accident) ([place]) 08/04/2018   Stroke ([place]) 08/04/2018   Chronic back pain 08/04/2018   Post-operative pain 09/20/2017   Postprocedural pseudomeningocele 09/20/2017   Lumbar stenosis with neurogenic claudication 02/04/2015   Stiffness of joints, not elsewhere classified, multiple sites 10/27/2012   Weakness of both legs 10/27/2012   Difficulty in  walking(719.7) 10/27/2012   9:04 AM, 07/01/21 Josue Hector PT DPT  Physical Therapist with [facility]  [phone] [place], [place], [address] Phone: [phone]   Fax:  [phone]  Name: KALILA NARO MRN: [id number] Date of Birth: Nov 13, 1946

## 2021-07-01 NOTE — Patient Instructions (Signed)
Access Code: [email or web address] URL: [email or web address] Date: 07/01/2021 Prepared by: Josue Hector  Exercises Heel Raises with Counter Support - 1-2 x daily - 7 x weekly - 2 sets - 10 reps Standing Hip Extension with Counter Support - 1-2 x daily - 7 x weekly - 2 sets - 10 reps Sit to Stand with Arms Crossed - 1-2 x daily - 7 x weekly - 2 sets - 10 reps

## 2021-07-03 ENCOUNTER — Ambulatory Visit (HOSPITAL_COMMUNITY): Payer: Medicare Other

## 2021-07-03 ENCOUNTER — Encounter (HOSPITAL_COMMUNITY): Payer: Self-pay

## 2021-07-03 ENCOUNTER — Other Ambulatory Visit: Payer: Self-pay

## 2021-07-03 DIAGNOSIS — M545 Low back pain, unspecified: Secondary | ICD-10-CM

## 2021-07-03 DIAGNOSIS — M25552 Pain in left hip: Secondary | ICD-10-CM

## 2021-07-03 DIAGNOSIS — M25551 Pain in right hip: Secondary | ICD-10-CM | POA: Diagnosis not present

## 2021-07-03 DIAGNOSIS — R262 Difficulty in walking, not elsewhere classified: Secondary | ICD-10-CM

## 2021-07-03 NOTE — Therapy (Signed)
[place], [place], [address] Phone: [phone]   Fax:  [phone]  Physical Therapy Treatment  Patient Details  Name: [name] MRN: [id number] Date of Birth: 1946-07-25 Referring Provider (PT): Basil Dess MD   Encounter Date: 07/03/2021   PT End of Session - 07/03/21 0907     Visit Number 6    Number of Visits 8    Date for PT Re-Evaluation 07/15/21    Authorization Type [employer]    PT Start Time 0901    PT Stop Time 0939    PT Time Calculation (min) 38 min    Activity Tolerance Patient tolerated treatment well    Behavior During Therapy [facility] for tasks assessed/performed             Past Medical History:  Diagnosis Date   Arthritis    Cataracts, bilateral    surgery to remove cataracts - bilateral   Chronic back pain    Hypertension    Stroke [facility]) 2019   Wears dentures     Past Surgical History:  Procedure Laterality Date   ABDOMINAL HYSTERECTOMY  84   APPENDECTOMY     BACK SURGERY  2010   BACK SURGERY  2015   BACK SURGERY  06/2016   BIOPSY  03/11/2021   Procedure: BIOPSY;  Surgeon: Eloise Harman, DO;  [facility];  Service: Endoscopy;;   BIOPSY  05/06/2021   Procedure: BIOPSY;  Surgeon: Eloise Harman, DO;  [facility];  Service: Endoscopy;;   CATARACT EXTRACTION W/PHACO Left 01/07/2017   Procedure: CATARACT EXTRACTION PHACO AND INTRAOCULAR LENS PLACEMENT ([place]);  Surgeon: Tonny Branch, MD;  [facility]: [facility];  Service: Ophthalmology;  Laterality: Left;  left CDE 9.31    CATARACT EXTRACTION W/PHACO Right 01/25/2017   Procedure: CATARACT EXTRACTION PHACO AND INTRAOCULAR LENS PLACEMENT ([place]) CDE:16.07;  Surgeon: Tonny Branch, MD;  Location: [facility];  Service: Ophthalmology;  Laterality: Right;  right   COLONOSCOPY  04/2008   Surgeon: Dr. Gala Romney; anal papilla, internal hemorrhoids, 5 mm polyp and diminutive polyp removed.  Pathology with 1 tubular adenoma and  benign colonic mucosa with focal surface erosion.  Recommend repeat colonoscopy in 5 years.   COLONOSCOPY WITH PROPOFOL N/A 03/11/2021   Procedure: COLONOSCOPY WITH PROPOFOL;  Surgeon: Eloise Harman, DO;  [facility];  Service: Endoscopy;  Laterality: N/A;  PM   ESOPHAGOGASTRODUODENOSCOPY  04/2008   Surgeon: Dr. Gala Romney; circumferential distal esophageal erosions consistent with nonerosive reflux esophagitis, small hiatal hernia, antral erosions, area of scar versus adenomatous change s/p biopsy (reactive gastropathy, negative H. pylori), normal examined duodenum.   ESOPHAGOGASTRODUODENOSCOPY (EGD) WITH PROPOFOL N/A 03/11/2021   Procedure: ESOPHAGOGASTRODUODENOSCOPY (EGD) WITH PROPOFOL;  Surgeon: Eloise Harman, DO;  [facility];  Service: Endoscopy;  Laterality: N/A;   ESOPHAGOGASTRODUODENOSCOPY (EGD) WITH PROPOFOL N/A 05/06/2021   Procedure: ESOPHAGOGASTRODUODENOSCOPY (EGD) WITH PROPOFOL;  Surgeon: Eloise Harman, DO;  [facility];  Service: Endoscopy;  Laterality: N/A;  7:30am   FRACTURE SURGERY Right    arm  1974   LUMBAR WOUND DEBRIDEMENT N/A 09/23/2017   Procedure: Exploration of Lumbar Wound; Repair of Pseudomeningocele;  Surgeon: Newman Pies, MD;  Location: [place];  Service: Neurosurgery;  Laterality: N/A;   POLYPECTOMY  03/11/2021   Procedure: POLYPECTOMY INTESTINAL;  Surgeon: Eloise Harman, DO;  [facility];  Service: Endoscopy;;   TONSILLECTOMY     TOTAL  KNEE ARTHROPLASTY Right 05/30/2019   Procedure: RIGHT TOTAL KNEE ARTHROPLASTY;  Surgeon: Mcarthur Rossetti, MD;  Location: [place];  Service: Orthopedics;  Laterality: Right;   TOTAL KNEE ARTHROPLASTY Left 10/17/2019   TOTAL KNEE ARTHROPLASTY Left 10/17/2019   Procedure: LEFT TOTAL KNEE ARTHROPLASTY;  Surgeon: Mcarthur Rossetti, MD;  Location: [place];  Service: Orthopedics;  Laterality: Left;    There were no vitals filed for this visit.   Subjective Assessment -  07/03/21 0903     Subjective Pt reports getting an injection this week or next week. pt reports normal pain 2/10 in low back.    Pertinent History 5 x lumbar surgery, bilateral TKA    Limitations Standing;Walking;House hold activities    How long can you stand comfortably? about 10 minutes    How long can you walk comfortably? 5-10 minutes    Diagnostic tests MRI (-) stenosis    Currently in Pain? Yes    Pain Location Back    Pain Orientation Lower    Pain Descriptors / Indicators Aching    Pain Type Chronic pain    Pain Onset More than a month ago                               [facility] Adult PT Treatment/Exercise - 07/03/21 0001       Therapeutic Activites    Therapeutic Activities Other Therapeutic Activities    Other Therapeutic Activities supine to sit and supine to prone, VCs for sequencing and log rolling, increasead time, educaiton on avoiding valsalva and UE placement to assist in completing transfer and reducing strain on low back      Lumbar Exercises: Stretches   Single Knee to Chest Stretch Right;Left;5 reps;10 seconds    Lower Trunk Rotation 5 reps;10 seconds   2 sets   Other Lumbar Stretch Exercise seated trunk rotation R/L with bolster between knees, 10 reps each direction, 3 sec hold    Other Lumbar Stretch Exercise standing reaching overhead R/L, 5 reps, 10 sec each      Lumbar Exercises: Seated   Sit to Stand 10 reps   eccentric lowering, no UE assist, VCs for exhale     Lumbar Exercises: Supine   Ab Set 15 reps;3 seconds    AB Set Limitations hooklying position    Bridge 10 reps;3 seconds    Bridge with clamshell 15 reps;3 seconds    Bridge with [employer] Limitations gait belt around thighs    Other Supine Lumbar Exercises sitting with gait belt around thighs, breath out/ab set while abd legs, 20 reps, 3 sec hold      Manual Therapy   Manual Therapy Soft tissue mobilization    Manual therapy comments completed separate of all other  skilled services     Soft tissue mobilization Pt prone with pillow under chest, STM to lumbar and low thoracic paraspinals to reduce muscle tension, VCs for relaxation and breathing                    PT Education - 07/03/21 0907     Education Details exercise technique, continue HEP    Person(s) Educated Patient    Methods Explanation;Demonstration;Verbal cues    Comprehension Verbalized understanding;Returned demonstration              PT Short Term Goals - 06/19/21 0930       PT SHORT TERM GOAL #1  Title Patient will be independent with initial HEP and self-management strategies to improve functional outcomes    Time 2    Period Weeks    Status On-going    Target Date 07/01/21               PT Long Term Goals - 06/19/21 0931       PT LONG TERM GOAL #1   Title Patient will improve FOTO score to predicted value to indicate improvement in functional outcomes    Time 4    Period Weeks    Status On-going      PT LONG TERM GOAL #2   Title Patient will report at least 75% overall improvement in subjective complaint to indicate improvement in ability to perform ADLs.    Time 4    Period Weeks    Status On-going      PT LONG TERM GOAL #3   Title Patient will be able to stand/ walk up to 30 minutes with pain not to exceed 3/10 in lumbar/ hips to improve ability to perform cooking/ shopping ADLs and exercise program    Time 4    Period Weeks    Status On-going      PT LONG TERM GOAL #4   Title Patient will have equal to or > 4/5 MMT throughout BLE to improve ability to perform functional mobility, stair ambulation and ADLs.    Time 4    Period Weeks    Status On-going                   Plan - 07/03/21 0909     Clinical Impression Statement Continued core strengthening, focus on ab activation with exhale to avoid val salva with exercises and mobility. Added STM to lumbar and low thoracic paraspinals with good results, pt reports improvement  in pain. Focused on bed mobility, requiring VCs for sequencing, hand placement, breath control to reduce strain on low back and improve mobility. Ended with stretching to promote muscle elongation and increased ROM with less pain. Pt continues to demonstrates forward flexed trunk posture in sitting and standing requiring VCs for upright posture and glute engagement to reduce back compensation. Continue to progress acute PT as able.    Examination-Activity Limitations Locomotion Level;Transfers;Stairs;Stand;Lift    Examination-Participation Restrictions Community Activity;Laundry;Occupation;Cleaning;Yard Work;Meal Prep    Stability/Clinical Decision Making Stable/Uncomplicated    Rehab Potential Good    PT Frequency 2x / week    PT Duration 4 weeks    PT Treatment/Interventions ADLs/Self Care Home Management;Aquatic Therapy;Biofeedback;Gait training;DME Instruction;Patient/family education;Orthotic Fit/Training;Neuromuscular re-education;Compression bandaging;Scar mobilization;Ultrasound;Parrafin;Fluidtherapy;Contrast Bath;Visual/perceptual remediation/compensation;Spinal Manipulations;Joint Manipulations;Dry needling;Passive range of motion;Energy conservation;Stair training;Functional mobility training;Splinting;Taping;Vasopneumatic Device;Therapeutic activities;Manual techniques;Manual lymph drainage;Therapeutic exercise;Traction;Moist Heat;Iontophoresis '4mg'$ /ml Dexamethasone;Cryotherapy;Electrical Stimulation;Canalith Repostioning    PT Next Visit Plan f/u with injections. Progress hip and core strengthening as tolerated.  Work on gait taking larger steps and upright posturing.  Progress postural strengthening. Add bands when ready    PT Home Exercise Plan Eval: bridge, ab set, clamshell;8/8:  standing extension, knee to chest and sitting tall; 8/11 LTR 8/16 sit to stand, standing hip extension    Consulted and Agree with Plan of Care Patient             Patient will benefit from skilled  therapeutic intervention in order to improve the following deficits and impairments:  Decreased endurance, Abnormal gait, Decreased activity tolerance, Decreased strength, Pain, Impaired flexibility, Improper body mechanics, Difficulty walking, Decreased mobility  Visit Diagnosis: Low back pain, unspecified  back pain laterality, unspecified chronicity, unspecified whether sciatica present  Pain in right hip  Pain in left hip  Difficulty in walking, not elsewhere classified     Problem List Patient Active Problem List   Diagnosis Date Noted   H/O adenomatous polyp of colon 02/24/2021   Melena 02/24/2021   Status post total left knee replacement 10/17/2019   Status post total right knee replacement 05/30/2019   Unilateral primary osteoarthritis, left knee 04/18/2019   Unilateral primary osteoarthritis, right knee 04/18/2019   CVA (cerebral vascular accident) ([place]) 08/04/2018   Stroke ([place]) 08/04/2018   Chronic back pain 08/04/2018   Post-operative pain 09/20/2017   Postprocedural pseudomeningocele 09/20/2017   Lumbar stenosis with neurogenic claudication 02/04/2015   Stiffness of joints, not elsewhere classified, multiple sites 10/27/2012   Weakness of both legs 10/27/2012   Difficulty in walking(719.7) 10/27/2012     Talbot Grumbling PT, DPT 07/03/21, 9:47 AM    [place], [place], [address] Phone: [phone]   Fax:  [phone]  Name: BRYNLEA KREISHER MRN: [id number] Date of Birth: [birth year]

## 2021-07-08 ENCOUNTER — Ambulatory Visit (HOSPITAL_COMMUNITY): Payer: Medicare Other | Admitting: Physical Therapy

## 2021-07-08 ENCOUNTER — Other Ambulatory Visit: Payer: Self-pay

## 2021-07-08 DIAGNOSIS — M545 Low back pain, unspecified: Secondary | ICD-10-CM

## 2021-07-08 DIAGNOSIS — M25551 Pain in right hip: Secondary | ICD-10-CM

## 2021-07-08 DIAGNOSIS — R262 Difficulty in walking, not elsewhere classified: Secondary | ICD-10-CM | POA: Diagnosis not present

## 2021-07-08 DIAGNOSIS — M25552 Pain in left hip: Secondary | ICD-10-CM | POA: Diagnosis not present

## 2021-07-08 NOTE — Therapy (Signed)
[place], [place], [address] Phone: [phone]   Fax:  [phone]  Physical Therapy Treatment  Patient Details  Name: [name] MRN: [id number] Date of Birth: 1946/01/25 Referring Provider (PT): Basil Dess MD   Encounter Date: 07/08/2021   PT End of Session - 07/08/21 0938     Visit Number 7    Number of Visits 8    Date for PT Re-Evaluation 07/15/21    Authorization Type [employer]    PT Start Time 0920    PT Stop Time 1000    PT Time Calculation (min) 40 min    Activity Tolerance Patient tolerated treatment well    Behavior During Therapy [facility] for tasks assessed/performed             Past Medical History:  Diagnosis Date   Arthritis    Cataracts, bilateral    surgery to remove cataracts - bilateral   Chronic back pain    Hypertension    Stroke [facility]) 2019   Wears dentures     Past Surgical History:  Procedure Laterality Date   ABDOMINAL HYSTERECTOMY  80   APPENDECTOMY     BACK SURGERY  2010   BACK SURGERY  2015   BACK SURGERY  06/2016   BIOPSY  03/11/2021   Procedure: BIOPSY;  Surgeon: Eloise Harman, DO;  [facility];  Service: Endoscopy;;   BIOPSY  05/06/2021   Procedure: BIOPSY;  Surgeon: Eloise Harman, DO;  [facility];  Service: Endoscopy;;   CATARACT EXTRACTION W/PHACO Left 01/07/2017   Procedure: CATARACT EXTRACTION PHACO AND INTRAOCULAR LENS PLACEMENT ([place]);  Surgeon: Tonny Branch, MD;  [facility];  Service: Ophthalmology;  Laterality: Left;  left CDE 9.31    CATARACT EXTRACTION W/PHACO Right 01/25/2017   Procedure: CATARACT EXTRACTION PHACO AND INTRAOCULAR LENS PLACEMENT ([place]) CDE:16.07;  Surgeon: Tonny Branch, MD;  [facility]: [facility];  Service: Ophthalmology;  Laterality: Right;  right   COLONOSCOPY  04/2008   Surgeon: Dr. Gala Romney; anal papilla, internal hemorrhoids, 5 mm polyp and diminutive polyp removed.  Pathology with 1 tubular adenoma and  benign colonic mucosa with focal surface erosion.  Recommend repeat colonoscopy in 5 years.   COLONOSCOPY WITH PROPOFOL N/A 03/11/2021   Procedure: COLONOSCOPY WITH PROPOFOL;  Surgeon: Eloise Harman, DO;  [facility];  Service: Endoscopy;  Laterality: N/A;  PM   ESOPHAGOGASTRODUODENOSCOPY  04/2008   Surgeon: Dr. Gala Romney; circumferential distal esophageal erosions consistent with nonerosive reflux esophagitis, small hiatal hernia, antral erosions, area of scar versus adenomatous change s/p biopsy (reactive gastropathy, negative H. pylori), normal examined duodenum.   ESOPHAGOGASTRODUODENOSCOPY (EGD) WITH PROPOFOL N/A 03/11/2021   Procedure: ESOPHAGOGASTRODUODENOSCOPY (EGD) WITH PROPOFOL;  Surgeon: Eloise Harman, DO;  [facility];  Service: Endoscopy;  Laterality: N/A;   ESOPHAGOGASTRODUODENOSCOPY (EGD) WITH PROPOFOL N/A 05/06/2021   Procedure: ESOPHAGOGASTRODUODENOSCOPY (EGD) WITH PROPOFOL;  Surgeon: Eloise Harman, DO;  [facility];  Service: Endoscopy;  Laterality: N/A;  7:30am   FRACTURE SURGERY Right    arm  1974   LUMBAR WOUND DEBRIDEMENT N/A 09/23/2017   Procedure: Exploration of Lumbar Wound; Repair of Pseudomeningocele;  Surgeon: Newman Pies, MD;  Location: [place];  Service: Neurosurgery;  Laterality: N/A;   POLYPECTOMY  03/11/2021   Procedure: POLYPECTOMY INTESTINAL;  Surgeon: Eloise Harman, DO;  [facility];  Service: Endoscopy;;   TONSILLECTOMY     TOTAL  KNEE ARTHROPLASTY Right 05/30/2019   Procedure: RIGHT TOTAL KNEE ARTHROPLASTY;  Surgeon: Mcarthur Rossetti, MD;  Location: [place];  Service: Orthopedics;  Laterality: Right;   TOTAL KNEE ARTHROPLASTY Left 10/17/2019   TOTAL KNEE ARTHROPLASTY Left 10/17/2019   Procedure: LEFT TOTAL KNEE ARTHROPLASTY;  Surgeon: Mcarthur Rossetti, MD;  Location: [place];  Service: Orthopedics;  Laterality: Left;    There were no vitals filed for this visit.   Subjective Assessment -  07/08/21 0925     Subjective Pt states she gets a shot in her back next friday (sept 2).  STates her pain is 4/10.  Reports no significant changes and had a rough weekend.    Currently in Pain? Yes    Pain Score 4     Pain Location Back    Pain Orientation Lower    Pain Descriptors / Indicators Aching    Pain Type Chronic pain                               [facility] Adult PT Treatment/Exercise - 07/08/21 0001       Lumbar Exercises: Stretches   Single Knee to Chest Stretch Right;Left;5 reps;10 seconds    Lower Trunk Rotation 5 reps;10 seconds      Lumbar Exercises: Supine   Ab Set 15 reps;3 seconds    AB Set Limitations hooklying position    Bridge 3 seconds;15 reps    Straight Leg Raise 15 reps    Straight Leg Raises Limitations with abdominal stab      Lumbar Exercises: Sidelying   Hip Abduction 15 reps      Knee/Hip Exercises: Prone   Other Prone Exercises heelsqueezes 10X5" holds      Manual Therapy   Manual Therapy Soft tissue mobilization    Manual therapy comments completed separate of all other skilled services     Soft tissue mobilization Pt prone with pillow under chest, STM to lumbar and low thoracic paraspinals to reduce muscle tension, VCs for relaxation and breathing                      PT Short Term Goals - 06/19/21 0930       PT SHORT TERM GOAL #1   Title Patient will be independent with initial HEP and self-management strategies to improve functional outcomes    Time 2    Period Weeks    Status On-going    Target Date 07/01/21               PT Long Term Goals - 06/19/21 0931       PT LONG TERM GOAL #1   Title Patient will improve FOTO score to predicted value to indicate improvement in functional outcomes    Time 4    Period Weeks    Status On-going      PT LONG TERM GOAL #2   Title Patient will report at least 75% overall improvement in subjective complaint to indicate improvement in ability to perform  ADLs.    Time 4    Period Weeks    Status On-going      PT LONG TERM GOAL #3   Title Patient will be able to stand/ walk up to 30 minutes with pain not to exceed 3/10 in lumbar/ hips to improve ability to perform cooking/ shopping ADLs and exercise program    Time 4    Period Weeks  Status On-going      PT LONG TERM GOAL #4   Title Patient will have equal to or > 4/5 MMT throughout BLE to improve ability to perform functional mobility, stair ambulation and ADLs.    Time 4    Period Weeks    Status On-going                   Plan - 07/08/21 0947     Clinical Impression Statement Pt returns with frustrations from her painful weekend.  States it may be from her having to discontinue her arthritis meds as they were making her nauseated.  Encouraged her to contact MD regarding this to see if there is another med she could take.  Pt is mostly compliant with HEP, however admits to not doing them over the weekend.  Encouraged to also sign up for silver sneakers at [facility].  Continued with stretches and core stabilization exercises.  Cues to increase hold times but able to complete in good form overall.  Added heelsqueezes in prone to activate glute mm.  Continued with soft tissue mobilization in prone to bil paraspinal mm as this seems to help reduce her pain level.    Examination-Activity Limitations Locomotion Level;Transfers;Stairs;Stand;Lift    Examination-Participation Restrictions Community Activity;Laundry;Occupation;Cleaning;Yard Work;Meal Prep    Stability/Clinical Decision Making Stable/Uncomplicated    Rehab Potential Good    PT Frequency 2x / week    PT Duration 4 weeks    PT Treatment/Interventions ADLs/Self Care Home Management;Aquatic Therapy;Biofeedback;Gait training;DME Instruction;Patient/family education;Orthotic Fit/Training;Neuromuscular re-education;Compression bandaging;Scar mobilization;Ultrasound;Parrafin;Fluidtherapy;Contrast Bath;Visual/perceptual  remediation/compensation;Spinal Manipulations;Joint Manipulations;Dry needling;Passive range of motion;Energy conservation;Stair training;Functional mobility training;Splinting;Taping;Vasopneumatic Device;Therapeutic activities;Manual techniques;Manual lymph drainage;Therapeutic exercise;Traction;Moist Heat;Iontophoresis '4mg'$ /ml Dexamethasone;Cryotherapy;Electrical Stimulation;Canalith Repostioning    PT Next Visit Plan complete re-evaluation next session.    PT Home Exercise Plan Eval: bridge, ab set, clamshell;8/8:  standing extension, knee to chest and sitting tall; 8/11 LTR 8/16 sit to stand, standing hip extension    Consulted and Agree with Plan of Care Patient             Patient will benefit from skilled therapeutic intervention in order to improve the following deficits and impairments:  Decreased endurance, Abnormal gait, Decreased activity tolerance, Decreased strength, Pain, Impaired flexibility, Improper body mechanics, Difficulty walking, Decreased mobility  Visit Diagnosis: Low back pain, unspecified back pain laterality, unspecified chronicity, unspecified whether sciatica present  Pain in right hip     Problem List Patient Active Problem List   Diagnosis Date Noted   H/O adenomatous polyp of colon 02/24/2021   Melena 02/24/2021   Status post total left knee replacement 10/17/2019   Status post total right knee replacement 05/30/2019   Unilateral primary osteoarthritis, left knee 04/18/2019   Unilateral primary osteoarthritis, right knee 04/18/2019   CVA (cerebral vascular accident) ([place]) 08/04/2018   Stroke ([place]) 08/04/2018   Chronic back pain 08/04/2018   Post-operative pain 09/20/2017   Postprocedural pseudomeningocele 09/20/2017   Lumbar stenosis with neurogenic claudication 02/04/2015   Stiffness of joints, not elsewhere classified, multiple sites 10/27/2012   Weakness of both legs 10/27/2012   Difficulty in walking(719.7) 10/27/2012   Teena Irani,  PTA/CLT [phone]  Teena Irani 07/08/2021, 9:50 AM  [place], [place], [address] Phone: [phone]   Fax:  [phone]  Name: CAITLAIN LOHR MRN: [id number] Date of Birth: 10-08-1946

## 2021-07-10 ENCOUNTER — Ambulatory Visit (HOSPITAL_COMMUNITY): Payer: Medicare Other

## 2021-07-10 ENCOUNTER — Encounter (HOSPITAL_COMMUNITY): Payer: Self-pay

## 2021-07-10 ENCOUNTER — Other Ambulatory Visit: Payer: Self-pay

## 2021-07-10 DIAGNOSIS — M25552 Pain in left hip: Secondary | ICD-10-CM | POA: Diagnosis not present

## 2021-07-10 DIAGNOSIS — M25551 Pain in right hip: Secondary | ICD-10-CM

## 2021-07-10 DIAGNOSIS — R262 Difficulty in walking, not elsewhere classified: Secondary | ICD-10-CM | POA: Diagnosis not present

## 2021-07-10 DIAGNOSIS — M545 Low back pain, unspecified: Secondary | ICD-10-CM

## 2021-07-10 NOTE — Therapy (Signed)
[place], [place], [address] Phone: [phone]   Fax:  [phone]  PHYSICAL THERAPY DISCHARGE SUMMARY  Visits from Start of Care: 8  Current functional level related to goals / functional outcomes: Pt is pleased with current functional status. Pt has met 1/1 STG and is working towards 4 LTGs with HEP and silver sneakers.   Remaining deficits: BLE weakness, gait abnormality, posture abnormality   Education / Equipment: HEP   Patient agrees to discharge. Patient goals were partially met. Patient is being discharged due to being pleased with the current functional level.   Physical Therapy Treatment  Patient Details  Name: [name] MRN: [id number] Date of Birth: Jan 20, 1946 Referring Provider (PT): Basil Dess MD   Encounter Date: 07/10/2021   PT End of Session - 07/10/21 0904     Visit Number 8    Number of Visits 8    Date for PT Re-Evaluation 07/15/21    Authorization Type [employer]    PT Start Time 0900    PT Stop Time 0915    PT Time Calculation (min) 15 min    Activity Tolerance Patient tolerated treatment well    Behavior During Therapy [facility] for tasks assessed/performed             Past Medical History:  Diagnosis Date   Arthritis    Cataracts, bilateral    surgery to remove cataracts - bilateral   Chronic back pain    Hypertension    Stroke ([place]) 2019   Wears dentures     Past Surgical History:  Procedure Laterality Date   ABDOMINAL HYSTERECTOMY  40   APPENDECTOMY     BACK SURGERY  2010   BACK SURGERY  2015   BACK SURGERY  06/2016   BIOPSY  03/11/2021   Procedure: BIOPSY;  Surgeon: Eloise Harman, DO;  [facility];  Service: Endoscopy;;   BIOPSY  05/06/2021   Procedure: BIOPSY;  Surgeon: Eloise Harman, DO;  [facility];  Service: Endoscopy;;   CATARACT EXTRACTION W/PHACO Left 01/07/2017   Procedure: CATARACT EXTRACTION PHACO AND INTRAOCULAR  LENS PLACEMENT ([place]);  Surgeon: Tonny Branch, MD;  [facility];  Service: Ophthalmology;  Laterality: Left;  left CDE 9.31    CATARACT EXTRACTION W/PHACO Right 01/25/2017   Procedure: CATARACT EXTRACTION PHACO AND INTRAOCULAR LENS PLACEMENT ([place]) CDE:16.07;  Surgeon: Tonny Branch, MD;  [facility];  Service: Ophthalmology;  Laterality: Right;  right   COLONOSCOPY  04/2008   Surgeon: Dr. Gala Romney; anal papilla, internal hemorrhoids, 5 mm polyp and diminutive polyp removed.  Pathology with 1 tubular adenoma and benign colonic mucosa with focal surface erosion.  Recommend repeat colonoscopy in 5 years.   COLONOSCOPY WITH PROPOFOL N/A 03/11/2021   Procedure: COLONOSCOPY WITH PROPOFOL;  Surgeon: Eloise Harman, DO;  [facility];  Service: Endoscopy;  Laterality: N/A;  PM   ESOPHAGOGASTRODUODENOSCOPY  04/2008   Surgeon: Dr. Gala Romney; circumferential distal esophageal erosions consistent with nonerosive reflux esophagitis, small hiatal hernia, antral erosions, area of scar versus adenomatous change s/p biopsy (reactive gastropathy, negative H. pylori), normal examined duodenum.   ESOPHAGOGASTRODUODENOSCOPY (EGD) WITH PROPOFOL N/A 03/11/2021   Procedure: ESOPHAGOGASTRODUODENOSCOPY (EGD) WITH PROPOFOL;  Surgeon: Eloise Harman, DO;  [facility];  Service: Endoscopy;  Laterality: N/A;   ESOPHAGOGASTRODUODENOSCOPY (EGD) WITH PROPOFOL N/A 05/06/2021   Procedure: ESOPHAGOGASTRODUODENOSCOPY (EGD) WITH PROPOFOL;  Surgeon: Eloise Harman, DO;  [facility];  Service: Endoscopy;  Laterality: N/A;  7:30am   FRACTURE SURGERY Right    arm  1974   LUMBAR WOUND DEBRIDEMENT N/A 09/23/2017   Procedure: Exploration of Lumbar Wound; Repair of Pseudomeningocele;  Surgeon: Newman Pies, MD;  Location: [place];  Service: Neurosurgery;  Laterality: N/A;   POLYPECTOMY  03/11/2021   Procedure: POLYPECTOMY INTESTINAL;  Surgeon: Eloise Harman, DO;  [facility];  Service:  Endoscopy;;   TONSILLECTOMY     TOTAL KNEE ARTHROPLASTY Right 05/30/2019   Procedure: RIGHT TOTAL KNEE ARTHROPLASTY;  Surgeon: Mcarthur Rossetti, MD;  Location: [place];  Service: Orthopedics;  Laterality: Right;   TOTAL KNEE ARTHROPLASTY Left 10/17/2019   TOTAL KNEE ARTHROPLASTY Left 10/17/2019   Procedure: LEFT TOTAL KNEE ARTHROPLASTY;  Surgeon: Mcarthur Rossetti, MD;  Location: [place];  Service: Orthopedics;  Laterality: Left;    There were no vitals filed for this visit.   Subjective Assessment - 07/10/21 0902     Subjective Pt reports she is ready to be done with PT, checked out the [facility] yesterday and is starting with silver sneakers next week. Pt reports being confident with exercises and has more body awareness now. Pt reports 2-3 pain in low back.    Currently in Pain? Yes    Pain Score 3     Pain Location Back    Pain Orientation Lower    Pain Descriptors / Indicators Aching    Pain Type Chronic pain                [facility] PT Assessment - 07/10/21 0001       Assessment   Medical Diagnosis Bilateral hip pain    Referring Provider (PT) Basil Dess MD    Next MD Visit September 2nd    Prior Therapy Yes      Balance Screen   Has the patient fallen in the past 6 months No      Observation/Other Assessments   Focus on Therapeutic Outcomes (FOTO)  52% function   was 51%     Posture/Postural Control   Posture/Postural Control Postural limitations    Postural Limitations Decreased lumbar lordosis;Flexed trunk      Strength   Right Hip Flexion 4/5   was 4   Right Hip ABduction 4/5   was 3+   Left Hip Flexion 4/5   was 4   Left Hip ABduction 4/5   was 4-   Right Knee Extension 4+/5   was 4   Left Knee Extension 4+/5   was 4+   Right Ankle Dorsiflexion 4+/5   was 4+   Left Ankle Dorsiflexion 4+/5   was 5     Transfers   Five time sit to stand comments  10 sec with min use of UEs   was 12.75 sec with min use of UEs                    PT  Education - 07/10/21 0904     Education Details Discussed goals, HEP, d/c to HEP and silver sneakers    Person(s) Educated Patient    Methods Explanation    Comprehension Verbalized understanding              PT Short Term Goals - 07/10/21 0905       PT SHORT TERM GOAL #1   Title Patient will be independent with initial HEP and self-management strategies to improve functional outcomes  Time 2    Period Weeks    Status Achieved    Target Date 07/01/21               PT Long Term Goals - 07/10/21 0905       PT LONG TERM GOAL #1   Title Patient will improve FOTO score to predicted value to indicate improvement in functional outcomes    Baseline 8/25: improved 1%    Time 4    Period Weeks    Status Not Met      PT LONG TERM GOAL #2   Title Patient will report at least 75% overall improvement in subjective complaint to indicate improvement in ability to perform ADLs.    Baseline 8/25: pt reports 50% improvement    Time 4    Period Weeks    Status On-going      PT LONG TERM GOAL #3   Title Patient will be able to stand/ walk up to 30 minutes with pain not to exceed 3/10 in lumbar/ hips to improve ability to perform cooking/ shopping ADLs and exercise program    Baseline 8/25: pt reports 10-15 minutes with pain 4/10    Time 4    Period Weeks    Status On-going      PT LONG TERM GOAL #4   Title Patient will have equal to or > 4/5 MMT throughout BLE to improve ability to perform functional mobility, stair ambulation and ADLs.    Baseline 8/25:    Time 4    Period Weeks    Status On-going                   Plan - 07/10/21 0905     Clinical Impression Statement Pt discharged to HEP this session, reports good knowledge of HEP and body awareness, ready to progress strengthening on her own and engage with silve sneakers at [facility]. Pt has met her STG and is working towards 4 LTGs, pt reports significant improvement even though FOTO did not indicate  improvement. Pt is satisfied with current functional status, reviewed goals, strength testing and 5x STS rest results compared to initial eval. Pt declines additional HEP exercises due to will be active with silver sneakers. Plan to d/c pt to HEP and educated to call for new referral if needs arise.    Examination-Activity Limitations Locomotion Level;Transfers;Stairs;Stand;Lift    Examination-Participation Restrictions Community Activity;Laundry;Occupation;Cleaning;Yard Work;Meal Prep    Stability/Clinical Decision Making Stable/Uncomplicated    Rehab Potential Good    PT Frequency 2x / week    PT Duration 4 weeks    PT Treatment/Interventions ADLs/Self Care Home Management;Aquatic Therapy;Biofeedback;Gait training;DME Instruction;Patient/family education;Orthotic Fit/Training;Neuromuscular re-education;Compression bandaging;Scar mobilization;Ultrasound;Parrafin;Fluidtherapy;Contrast Bath;Visual/perceptual remediation/compensation;Spinal Manipulations;Joint Manipulations;Dry needling;Passive range of motion;Energy conservation;Stair training;Functional mobility training;Splinting;Taping;Vasopneumatic Device;Therapeutic activities;Manual techniques;Manual lymph drainage;Therapeutic exercise;Traction;Moist Heat;Iontophoresis 23m/ml Dexamethasone;Cryotherapy;Electrical Stimulation;Canalith Repostioning    PT Next Visit Plan d/c to HEP    PT Home Exercise Plan Eval: bridge, ab set, clamshell;8/8:  standing extension, knee to chest and sitting tall; 8/11 LTR 8/16 sit to stand, standing hip extension    Consulted and Agree with Plan of Care Patient             Patient will benefit from skilled therapeutic intervention in order to improve the following deficits and impairments:  Decreased endurance, Abnormal gait, Decreased activity tolerance, Decreased strength, Pain, Impaired flexibility, Improper body mechanics, Difficulty walking, Decreased mobility  Visit Diagnosis: Low back pain, unspecified  back pain laterality, unspecified chronicity, unspecified whether sciatica  present  Pain in right hip  Pain in left hip     Problem List Patient Active Problem List   Diagnosis Date Noted   H/O adenomatous polyp of colon 02/24/2021   Melena 02/24/2021   Status post total left knee replacement 10/17/2019   Status post total right knee replacement 05/30/2019   Unilateral primary osteoarthritis, left knee 04/18/2019   Unilateral primary osteoarthritis, right knee 04/18/2019   CVA (cerebral vascular accident) ([place]) 08/04/2018   Stroke ([place]) 08/04/2018   Chronic back pain 08/04/2018   Post-operative pain 09/20/2017   Postprocedural pseudomeningocele 09/20/2017   Lumbar stenosis with neurogenic claudication 02/04/2015   Stiffness of joints, not elsewhere classified, multiple sites 10/27/2012   Weakness of both legs 10/27/2012   Difficulty in walking(719.7) 10/27/2012    Talbot Grumbling PT, DPT 07/10/21, 9:20 AM    [place], [place], [address] Phone: [phone]   Fax:  [phone]  Name: [name] MRN: [id number] Date of Birth: 1946/05/27

## 2021-07-15 ENCOUNTER — Encounter (HOSPITAL_COMMUNITY): Payer: Medicare Other | Admitting: Physical Therapy

## 2021-07-16 ENCOUNTER — Telehealth: Payer: Self-pay | Admitting: Specialist

## 2021-07-16 DIAGNOSIS — I10 Essential (primary) hypertension: Secondary | ICD-10-CM | POA: Diagnosis not present

## 2021-07-16 DIAGNOSIS — K219 Gastro-esophageal reflux disease without esophagitis: Secondary | ICD-10-CM | POA: Diagnosis not present

## 2021-07-16 NOTE — Telephone Encounter (Signed)
Patient called. She wants a referral for Dr Ernestina Patches. Her call back number is [phone]

## 2021-07-17 ENCOUNTER — Encounter (HOSPITAL_COMMUNITY): Payer: Medicare Other | Admitting: Physical Therapy

## 2021-07-18 ENCOUNTER — Ambulatory Visit: Payer: Medicare Other | Admitting: Specialist

## 2021-07-31 ENCOUNTER — Ambulatory Visit: Payer: Medicare Other | Admitting: Specialist

## 2021-08-01 DIAGNOSIS — I10 Essential (primary) hypertension: Secondary | ICD-10-CM | POA: Diagnosis not present

## 2021-08-01 DIAGNOSIS — E119 Type 2 diabetes mellitus without complications: Secondary | ICD-10-CM | POA: Diagnosis not present

## 2021-08-05 DIAGNOSIS — N1831 Chronic kidney disease, stage 3a: Secondary | ICD-10-CM | POA: Diagnosis not present

## 2021-08-05 DIAGNOSIS — K219 Gastro-esophageal reflux disease without esophagitis: Secondary | ICD-10-CM | POA: Diagnosis not present

## 2021-08-05 DIAGNOSIS — M48062 Spinal stenosis, lumbar region with neurogenic claudication: Secondary | ICD-10-CM | POA: Diagnosis not present

## 2021-08-05 DIAGNOSIS — Z96651 Presence of right artificial knee joint: Secondary | ICD-10-CM | POA: Diagnosis not present

## 2021-08-05 DIAGNOSIS — I10 Essential (primary) hypertension: Secondary | ICD-10-CM | POA: Diagnosis not present

## 2021-08-05 DIAGNOSIS — R7301 Impaired fasting glucose: Secondary | ICD-10-CM | POA: Diagnosis not present

## 2021-08-05 DIAGNOSIS — R011 Cardiac murmur, unspecified: Secondary | ICD-10-CM | POA: Diagnosis not present

## 2021-08-05 DIAGNOSIS — D509 Iron deficiency anemia, unspecified: Secondary | ICD-10-CM | POA: Diagnosis not present

## 2021-08-05 DIAGNOSIS — E782 Mixed hyperlipidemia: Secondary | ICD-10-CM | POA: Diagnosis not present

## 2021-08-05 DIAGNOSIS — Z96652 Presence of left artificial knee joint: Secondary | ICD-10-CM | POA: Diagnosis not present

## 2021-08-14 ENCOUNTER — Encounter: Payer: Self-pay | Admitting: Specialist

## 2021-08-14 ENCOUNTER — Ambulatory Visit: Payer: Self-pay

## 2021-08-14 ENCOUNTER — Ambulatory Visit: Payer: Medicare Other | Admitting: Specialist

## 2021-08-14 ENCOUNTER — Other Ambulatory Visit: Payer: Self-pay

## 2021-08-14 VITALS — BP 156/81 | HR 60 | Ht 64.0 in | Wt 220.0 lb

## 2021-08-14 DIAGNOSIS — M1611 Unilateral primary osteoarthritis, right hip: Secondary | ICD-10-CM

## 2021-08-14 DIAGNOSIS — M25559 Pain in unspecified hip: Secondary | ICD-10-CM

## 2021-08-14 DIAGNOSIS — M1712 Unilateral primary osteoarthritis, left knee: Secondary | ICD-10-CM | POA: Diagnosis not present

## 2021-08-14 DIAGNOSIS — Z96651 Presence of right artificial knee joint: Secondary | ICD-10-CM

## 2021-08-14 DIAGNOSIS — M545 Low back pain, unspecified: Secondary | ICD-10-CM | POA: Diagnosis not present

## 2021-08-14 DIAGNOSIS — M79651 Pain in right thigh: Secondary | ICD-10-CM

## 2021-08-14 DIAGNOSIS — T8484XD Pain due to internal orthopedic prosthetic devices, implants and grafts, subsequent encounter: Secondary | ICD-10-CM

## 2021-08-14 NOTE — Patient Instructions (Addendum)
Plan: hip is suffering from osteoarthritis, only real proven treatments are Weight loss, NSIADs like diclofenac you should not take due to need for aspirin for history of stroke and exercise. Well padded shoes help. Ice the hip that is suffering from osteoarthritis, only real proven treatments are Cane in the left hand for right hip arthritis Ice the hip 2-3 times a day 15-20 mins at a time.-3 times a day 15-20 mins at a time. Hot showers in the AM.  Injection with steroid may be of benefit. Hemp CBD capsules, amazon.com 5,000-7,000 mg per bottle, 60 capsules per bottle, take one capsule twice a day. Cane in the left hand to use with right leg weight bearing. Follow-Up Instructions: No follow-ups on file.

## 2021-08-14 NOTE — Progress Notes (Signed)
Office Visit Note   Patient: [name]           Date of Birth: Jun 04, 1946           MRN: [id number] Visit Date: 08/14/2021              Requested by: Celene Squibb, MD [place],  [place] [id number] PCP: Celene Squibb, MD   Assessment & Plan: Visit Diagnoses:  1. Pain due to total right knee replacement, subsequent encounter   2. Acute pain of right thigh   3. Hip pain   4. Low back pain, unspecified back pain laterality, unspecified chronicity, unspecified whether sciatica present   5. Status post right knee replacement   6. Unilateral primary osteoarthritis, left knee   7. Unilateral primary osteoarthritis, right hip     Plan: Plan: hip is suffering from osteoarthritis, only real proven treatments are Weight loss, NSIADs like diclofenac you should not take due to need for aspirin for history of stroke and exercise. Well padded shoes help. Ice the hip that is suffering from osteoarthritis, only real proven treatments are Cane in the left hand for right hip arthritis Ice the hip 2-3 times a day 15-20 mins at a time.-3 times a day 15-20 mins at a time. Hot showers in the AM.  Injection with steroid may be of benefit. Hemp CBD capsules, amazon.com 5,000-7,000 mg per bottle, 60 capsules per bottle, take one capsule twice a day. Cane in the left hand to use with right leg weight bearing. Follow-Up Instructions: No follow-ups on file.  Follow-Up Instructions: Return in about 4 weeks (around 09/11/2021).   Orders:  Orders Placed This Encounter  Procedures   XR FEMUR, MIN 2 VIEWS RIGHT   XR Knee 1-2 Views Right   Ambulatory referral to Physical Medicine Rehab   No orders of the defined types were placed in this encounter.     Procedures: No procedures performed   Clinical Data: No additional findings.   Subjective: Chief Complaint  Patient presents with   Lower Back - Follow-up    75 year old female with history of right TKR by Dr. Ninfa Linden in  05/2019. And previous L2-3, L3-4 and L4-5 TLIFs by Dr. Arnoldo Morale for  Lumbar stenosis complicated by a CSF leak and she was returned to the OR for repair. Now with a history of increasing right distal anteromedial thigh pain. Pain is present with standing and walking and she has night pain. Lumbar MRI done 05/2021 with no significant acute changes no significant spinal stenosis of foramenal stenosis. She is complaining of worsening pain and now numbness right anterior thigh. No change in bowel or bladder. 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  Review of Systems   Objective: Vital Signs: BP (!) 156/81 (BP Location: Left Arm)   Pulse 60   Ht 5\' 4"  (1.626 m)   Wt 220 lb (99.8 kg)   LMP  (LMP Unknown)   BMI 37.76 kg/m   Physical Exam  Ortho Exam  Specialty Comments:  No specialty comments available.  Imaging: No results found.   PMFS History: Patient Active Problem List   Diagnosis Date Noted   H/O adenomatous polyp of colon 02/24/2021   Melena 02/24/2021   Status post total left knee replacement 10/17/2019   Status post total right knee replacement 05/30/2019   Unilateral primary osteoarthritis, left knee 04/18/2019   Unilateral primary  osteoarthritis, right knee 04/18/2019   CVA (cerebral vascular accident) ([place]) 08/04/2018   Stroke ([place]) 08/04/2018   Chronic back pain 08/04/2018   Post-operative pain 09/20/2017   Postprocedural pseudomeningocele 09/20/2017   Lumbar stenosis with neurogenic claudication 02/04/2015   Stiffness of joints, not elsewhere classified, multiple sites 10/27/2012   Weakness of both legs 10/27/2012   Difficulty in walking(719.7) 10/27/2012   Past Medical History:  Diagnosis Date   Arthritis    Cataracts, bilateral    surgery to remove cataracts - bilateral   Chronic back pain    Hypertension    Stroke [facility]) 2019   Wears dentures     Family History  Problem Relation Age of Onset    Hypertension Mother    Stomach cancer Mother 19   Stomach cancer Maternal Aunt        late 50s   Breast cancer Neg Hx    Stroke Neg Hx    Colon cancer Neg Hx     Past Surgical History:  Procedure Laterality Date   ABDOMINAL HYSTERECTOMY  6   APPENDECTOMY     BACK SURGERY  2010   BACK SURGERY  2015   BACK SURGERY  06/2016   BIOPSY  03/11/2021   Procedure: BIOPSY;  Surgeon: Eloise Harman, DO;  [facility];  Service: Endoscopy;;   BIOPSY  05/06/2021   Procedure: BIOPSY;  Surgeon: Eloise Harman, DO;  [facility];  Service: Endoscopy;;   CATARACT EXTRACTION W/PHACO Left 01/07/2017   Procedure: CATARACT EXTRACTION PHACO AND INTRAOCULAR LENS PLACEMENT ([place]);  Surgeon: Tonny Branch, MD;  [facility];  Service: Ophthalmology;  Laterality: Left;  left CDE 9.31    CATARACT EXTRACTION W/PHACO Right 01/25/2017   Procedure: CATARACT EXTRACTION PHACO AND INTRAOCULAR LENS PLACEMENT ([place]) CDE:16.07;  Surgeon: Tonny Branch, MD;  [facility];  Service: Ophthalmology;  Laterality: Right;  right   COLONOSCOPY  04/2008   Surgeon: Dr. Gala Romney; anal papilla, internal hemorrhoids, 5 mm polyp and diminutive polyp removed.  Pathology with 1 tubular adenoma and benign colonic mucosa with focal surface erosion.  Recommend repeat colonoscopy in 5 years.   COLONOSCOPY WITH PROPOFOL N/A 03/11/2021   Procedure: COLONOSCOPY WITH PROPOFOL;  Surgeon: Eloise Harman, DO;  [facility];  Service: Endoscopy;  Laterality: N/A;  PM   ESOPHAGOGASTRODUODENOSCOPY  04/2008   Surgeon: Dr. Gala Romney; circumferential distal esophageal erosions consistent with nonerosive reflux esophagitis, small hiatal hernia, antral erosions, area of scar versus adenomatous change s/p biopsy (reactive gastropathy, negative H. pylori), normal examined duodenum.   ESOPHAGOGASTRODUODENOSCOPY (EGD) WITH PROPOFOL N/A 03/11/2021   Procedure: ESOPHAGOGASTRODUODENOSCOPY (EGD) WITH PROPOFOL;  Surgeon: Eloise Harman, DO;  [facility];  Service: Endoscopy;  Laterality: N/A;   ESOPHAGOGASTRODUODENOSCOPY (EGD) WITH PROPOFOL N/A 05/06/2021   Procedure: ESOPHAGOGASTRODUODENOSCOPY (EGD) WITH PROPOFOL;  Surgeon: Eloise Harman, DO;  [facility];  Service: Endoscopy;  Laterality: N/A;  7:30am   FRACTURE SURGERY Right    arm  1974   LUMBAR WOUND DEBRIDEMENT N/A 09/23/2017   Procedure: Exploration of Lumbar Wound; Repair of Pseudomeningocele;  Surgeon: Newman Pies, MD;  Location: [place];  Service: Neurosurgery;  Laterality: N/A;   POLYPECTOMY  03/11/2021   Procedure: POLYPECTOMY INTESTINAL;  Surgeon: Eloise Harman, DO;  [facility];  Service: Endoscopy;;   TONSILLECTOMY     TOTAL KNEE ARTHROPLASTY Right 05/30/2019   Procedure: RIGHT TOTAL KNEE ARTHROPLASTY;  Surgeon: Mcarthur Rossetti, MD;  Location: [place];  Service: Orthopedics;  Laterality: Right;   TOTAL KNEE ARTHROPLASTY Left 10/17/2019   TOTAL KNEE ARTHROPLASTY Left 10/17/2019   Procedure: LEFT TOTAL KNEE ARTHROPLASTY;  Surgeon: Mcarthur Rossetti, MD;  Location: [place];  Service: Orthopedics;  Laterality: Left;   Social History   Occupational History   Not on file  Tobacco Use   Smoking status: Never   Smokeless tobacco: Never  Vaping Use   Vaping Use: Never used  Substance and Sexual Activity   Alcohol use: No   Drug use: No   Sexual activity: Not Currently    Birth control/protection: Post-menopausal

## 2021-08-18 ENCOUNTER — Telehealth: Payer: Self-pay | Admitting: Physical Medicine and Rehabilitation

## 2021-08-18 NOTE — Telephone Encounter (Signed)
Patient called. Returning a call to schedule with Dr. Newton.  ?

## 2021-09-01 ENCOUNTER — Other Ambulatory Visit: Payer: Self-pay

## 2021-09-01 ENCOUNTER — Ambulatory Visit (INDEPENDENT_AMBULATORY_CARE_PROVIDER_SITE_OTHER): Payer: Medicare Other | Admitting: Physical Medicine and Rehabilitation

## 2021-09-01 ENCOUNTER — Ambulatory Visit: Payer: Self-pay

## 2021-09-01 DIAGNOSIS — M25552 Pain in left hip: Secondary | ICD-10-CM

## 2021-09-01 NOTE — Progress Notes (Signed)
   [name] - 75 y.o. female MRN [id number]  Date of birth: 19-May-1946  Office Visit Note: Visit Date: 09/01/2021 PCP: Celene Squibb, MD Referred by: Celene Squibb, MD  Subjective: Chief Complaint  Patient presents with   Right Hip - Pain   HPI:  [name] is a 75 y.o. female who comes in today at the request of Dr. Basil Dess for planned Right anesthetic hip arthrogram with fluoroscopic guidance.  The patient has failed conservative care including home exercise, medications, time and activity modification.  This injection will be diagnostic and hopefully therapeutic.  Please see requesting physician notes for further details and justification.   ROS Otherwise per HPI.  Assessment & Plan: Visit Diagnoses:    ICD-10-CM   1. Pain in left hip  M25.552 XR C-ARM NO REPORT    Large Joint Inj: R hip joint      Plan: No additional findings.   Meds & Orders: No orders of the defined types were placed in this encounter.   Orders Placed This Encounter  Procedures   Large Joint Inj: R hip joint   XR C-ARM NO REPORT    Follow-up: Return if symptoms worsen or fail to improve.   Procedures: Large Joint Inj: R hip joint on 09/01/2021 10:10 AM Indications: diagnostic evaluation and pain Details: 22 G 3.5 in needle, fluoroscopy-guided anterior approach  Arthrogram: No  Medications: 4 mL bupivacaine 0.25 %; 60 mg triamcinolone acetonide 40 MG/ML Outcome: tolerated well, no immediate complications  There was excellent flow of contrast producing a partial arthrogram of the hip. The patient did have relief of symptoms during the anesthetic phase of the injection. Procedure, treatment alternatives, risks and benefits explained, specific risks discussed. Consent was given by the patient. Immediately prior to procedure a time out was called to verify the correct patient, procedure, equipment, support staff and site/side marked as required. Patient was prepped and draped in the usual  sterile fashion.         Clinical History: No specialty comments available.     Objective:  VS:  HT:    WT:   BMI:     BP:   HR: bpm  TEMP: ( )  RESP:  Physical Exam   Imaging: No results found.

## 2021-09-01 NOTE — Progress Notes (Signed)
Pt state right hip pain. Pt state waking, standing and laying down makes the pain worse. Pt state she takes pain meds and uses heating to help ease her pain.  Numeric Pain Rating Scale and Functional Assessment Average Pain 5   In the last MONTH (on 0-10 scale) has pain interfered with the following?  1. General activity like being  able to carry out your everyday physical activities such as walking, climbing stairs, carrying groceries, or moving a chair?  Rating(10)   -BT, -Dye Allergies.

## 2021-09-15 DIAGNOSIS — I10 Essential (primary) hypertension: Secondary | ICD-10-CM | POA: Diagnosis not present

## 2021-09-15 DIAGNOSIS — K219 Gastro-esophageal reflux disease without esophagitis: Secondary | ICD-10-CM | POA: Diagnosis not present

## 2021-09-15 MED ORDER — TRIAMCINOLONE ACETONIDE 40 MG/ML IJ SUSP
60.0000 mg | INTRAMUSCULAR | Status: AC | PRN
  Administered 2021-09-01: 60 mg via INTRA_ARTICULAR

## 2021-09-15 MED ORDER — BUPIVACAINE HCL 0.25 % IJ SOLN
4.0000 mL | INTRAMUSCULAR | Status: AC | PRN
  Administered 2021-09-01: 4 mL via INTRA_ARTICULAR

## 2021-09-17 ENCOUNTER — Telehealth: Payer: Self-pay | Admitting: Specialist

## 2021-09-17 NOTE — Telephone Encounter (Signed)
Patient called. She would like an injection in her back. Her call back number is [phone]

## 2021-09-18 ENCOUNTER — Other Ambulatory Visit: Payer: Self-pay | Admitting: Radiology

## 2021-09-18 DIAGNOSIS — M545 Low back pain, unspecified: Secondary | ICD-10-CM

## 2021-09-18 NOTE — Telephone Encounter (Signed)
Order placed for injection with Dr. Ernestina Patches.

## 2021-09-22 ENCOUNTER — Other Ambulatory Visit: Payer: Self-pay | Admitting: Internal Medicine

## 2021-09-22 DIAGNOSIS — Z1231 Encounter for screening mammogram for malignant neoplasm of breast: Secondary | ICD-10-CM

## 2021-09-23 DIAGNOSIS — Z0001 Encounter for general adult medical examination with abnormal findings: Secondary | ICD-10-CM | POA: Diagnosis not present

## 2021-09-29 ENCOUNTER — Ambulatory Visit: Payer: Medicare Other | Admitting: Specialist

## 2021-10-06 ENCOUNTER — Encounter: Payer: Self-pay | Admitting: Physical Medicine and Rehabilitation

## 2021-10-06 ENCOUNTER — Ambulatory Visit (INDEPENDENT_AMBULATORY_CARE_PROVIDER_SITE_OTHER): Payer: Medicare Other | Admitting: Physical Medicine and Rehabilitation

## 2021-10-06 ENCOUNTER — Other Ambulatory Visit: Payer: Self-pay

## 2021-10-06 ENCOUNTER — Ambulatory Visit: Payer: Self-pay

## 2021-10-06 VITALS — BP 156/85 | HR 78

## 2021-10-06 DIAGNOSIS — G894 Chronic pain syndrome: Secondary | ICD-10-CM | POA: Diagnosis not present

## 2021-10-06 DIAGNOSIS — M961 Postlaminectomy syndrome, not elsewhere classified: Secondary | ICD-10-CM | POA: Diagnosis not present

## 2021-10-06 DIAGNOSIS — M5416 Radiculopathy, lumbar region: Secondary | ICD-10-CM

## 2021-10-06 DIAGNOSIS — M461 Sacroiliitis, not elsewhere classified: Secondary | ICD-10-CM

## 2021-10-06 MED ORDER — BETAMETHASONE SOD PHOS & ACET 6 (3-3) MG/ML IJ SUSP
12.0000 mg | Freq: Once | INTRAMUSCULAR | Status: AC
  Administered 2021-10-06: 12 mg

## 2021-10-06 NOTE — Patient Instructions (Signed)

## 2021-10-06 NOTE — Progress Notes (Signed)
Pt state lower back pain that travels down both legs. Pt state waking, standing and laying down makes the pain worse. Pt state she takes pain meds and uses heating to help ease her pain.  Numeric Pain Rating Scale and Functional Assessment Average Pain 7   In the last MONTH (on 0-10 scale) has pain interfered with the following?  1. General activity like being  able to carry out your everyday physical activities such as walking, climbing stairs, carrying groceries, or moving a chair?  Rating(10)   +Driver, -BT, -Dye Allergies.

## 2021-10-06 NOTE — Procedures (Signed)
S1 Lumbosacral Transforaminal Epidural Steroid Injection - Sub-Pedicular Approach with Fluoroscopic Guidance   Patient: [name]      Date of Birth: 03/17/1946 MRN: [id number] PCP: Celene Squibb, MD      Visit Date: 10/06/2021   Universal Protocol:    Date/Time: 11/21/221:05 PM  Consent Given By: the patient  Position:  PRONE  Additional Comments: Vital signs were monitored before and after the procedure. Patient was prepped and draped in the usual sterile fashion. The correct patient, procedure, and site was verified.   Injection Procedure Details:  Procedure Site One Meds Administered:  Meds ordered this encounter  Medications   betamethasone acetate-betamethasone sodium phosphate (CELESTONE) injection 12 mg    Laterality: Bilateral  Location/Site:  S1 Foramen   Needle size: 22 ga.  Needle type: Spinal  Needle Placement: Transforaminal  Findings:   -Comments: Excellent flow of contrast along the nerve, nerve root and into the epidural space.  Epidurogram: Contrast epidurogram showed no nerve root cut off or restricted flow pattern.  Procedure Details: After squaring off the sacral end-plate to get a true AP view, the C-arm was positioned so that the best possible view of the S1 foramen was visualized. The soft tissues overlying this structure were infiltrated with 2-3 ml. of 1% Lidocaine without Epinephrine.    The spinal needle was inserted toward the target using a "trajectory" view along the fluoroscope beam.  Under AP and lateral visualization, the needle was advanced so it did not puncture dura. Biplanar projections were used to confirm position. Aspiration was confirmed to be negative for CSF and/or blood. A 1-2 ml. volume of Isovue-250 was injected and flow of contrast was noted at each level. Radiographs were obtained for documentation purposes.   After attaining the desired flow of contrast documented above, a 0.5 to 1.0 ml test dose of 0.25% Marcaine  was injected into each respective transforaminal space.  The patient was observed for 90 seconds post injection.  After no sensory deficits were reported, and normal lower extremity motor function was noted,   the above injectate was administered so that equal amounts of the injectate were placed at each foramen (level) into the transforaminal epidural space.   Additional Comments:  The patient tolerated the procedure well Dressing: Band-Aid with 2 x 2 sterile gauze    Post-procedure details: Patient was observed during the procedure. Post-procedure instructions were reviewed.  Patient left the clinic in stable condition.

## 2021-10-06 NOTE — Progress Notes (Signed)
[name] - 75 y.o. female MRN [id number]  Date of birth: 08/26/1946  Office Visit Note: Visit Date: 10/06/2021 PCP: Celene Squibb, MD Referred by: Celene Squibb, MD  Subjective: Chief Complaint  Patient presents with   Lower Back - Pain   Left Leg - Pain   Right Leg - Pain   HPI: [name] is a 75 y.o. female who comes in todayAt the request of Dr. Basil Dess for evaluation and management of the patient's lower back and bilateral leg pain.  Her history is well-documented through Dr. Basil Dess and our prior notes.  Briefly patient reports severe low back pain status post lumbar fusion from L2-S1 by Dr. Newman Pies in 2018.  This was complicated by dural leak and pseudo meningocele.  Patient underwent exploration of that surgery and ever since then according to her husband and the patient she has had increasing recalcitrant low back pain with leg pain.  Her biggest complaint is right low back upper buttock pain worse with standing and sitting and twisting.  Right much more than the left.  Secondary issue is bilateral hip and leg pain with paresthesia and numbness particularly at night.  This seems to be more of an S1 distribution but somewhat L5.  She has had more recent imaging showing no real frank nerve compression.  She reports weakness at times but more giveaway weakness.  She has tried and failed all manner of conservative care with physical therapy and medication management and surgery.  I saw her recently for a diagnostic right intra-articular hip injection which gave her no relief during the anesthetic phase or afterwards.  She gets some symptoms into the feet and it seems to be more of the bottom of the feet.  She is not diabetic.  Her case is complicated by prior history of stroke and bilateral total knee replacements by Dr. Jean Rosenthal in our office.     Review of Systems  Musculoskeletal:  Positive for back pain and joint pain.  Neurological:  Positive for  tingling.  All other systems reviewed and are negative. Otherwise per HPI.  Assessment & Plan: Visit Diagnoses:    ICD-10-CM   1. Lumbar radiculopathy  M54.16 XR C-ARM NO REPORT    Epidural Steroid injection    betamethasone acetate-betamethasone sodium phosphate (CELESTONE) injection 12 mg    2. Post laminectomy syndrome  M96.1     3. Chronic pain syndrome  G89.4     4. Sacroiliitis (HCC)  M46.1        Plan: Findings:  Chronic recalcitrant severe right more than left low back pain upper buttock pain but also with bilateral hip and leg pain with paresthesia and tingling numbness.  Symptoms can be consistent overall with more of an S1 nerve root problem bilaterally with right buttock pain more than left.  Patient could be having pain from the right sacroiliac joint as well.  We see this a lot with patient with fusion down to S1.  I did complete diagnostic hip injection in the past which did not help.  Today we decided to complete bilateral S1 transforaminal epidural steroid injection to see if she would get some relief overall.  She is following up with Dr. Louanne Skye in 2 weeks.  Depending on relief would suggest right sacroiliac joint injection with fluoroscopic guidance.  Fluoroscopic imaging today did show some sclerotic change of the right sacroiliac joint compared to left.  She will continue with current medication.  Meds & Orders:  Meds ordered this encounter  Medications   betamethasone acetate-betamethasone sodium phosphate (CELESTONE) injection 12 mg    Orders Placed This Encounter  Procedures   XR C-ARM NO REPORT   Epidural Steroid injection    Follow-up: Return for visit to requesting provider as needed.   Procedures: No procedures performed  S1 Lumbosacral Transforaminal Epidural Steroid Injection - Sub-Pedicular Approach with Fluoroscopic Guidance   Patient: [name]      Date of Birth: 05/09/1946 MRN: [id number] PCP: Celene Squibb, MD      Visit Date: 10/06/2021    Universal Protocol:    Date/Time: 11/21/221:05 PM  Consent Given By: the patient  Position:  PRONE  Additional Comments: Vital signs were monitored before and after the procedure. Patient was prepped and draped in the usual sterile fashion. The correct patient, procedure, and site was verified.   Injection Procedure Details:  Procedure Site One Meds Administered:  Meds ordered this encounter  Medications   betamethasone acetate-betamethasone sodium phosphate (CELESTONE) injection 12 mg    Laterality: Bilateral  Location/Site:  S1 Foramen   Needle size: 22 ga.  Needle type: Spinal  Needle Placement: Transforaminal  Findings:   -Comments: Excellent flow of contrast along the nerve, nerve root and into the epidural space.  Epidurogram: Contrast epidurogram showed no nerve root cut off or restricted flow pattern.  Procedure Details: After squaring off the sacral end-plate to get a true AP view, the C-arm was positioned so that the best possible view of the S1 foramen was visualized. The soft tissues overlying this structure were infiltrated with 2-3 ml. of 1% Lidocaine without Epinephrine.    The spinal needle was inserted toward the target using a "trajectory" view along the fluoroscope beam.  Under AP and lateral visualization, the needle was advanced so it did not puncture dura. Biplanar projections were used to confirm position. Aspiration was confirmed to be negative for CSF and/or blood. A 1-2 ml. volume of Isovue-250 was injected and flow of contrast was noted at each level. Radiographs were obtained for documentation purposes.   After attaining the desired flow of contrast documented above, a 0.5 to 1.0 ml test dose of 0.25% Marcaine was injected into each respective transforaminal space.  The patient was observed for 90 seconds post injection.  After no sensory deficits were reported, and normal lower extremity motor function was noted,   the above injectate was  administered so that equal amounts of the injectate were placed at each foramen (level) into the transforaminal epidural space.   Additional Comments:  The patient tolerated the procedure well Dressing: Band-Aid with 2 x 2 sterile gauze    Post-procedure details: Patient was observed during the procedure. Post-procedure instructions were reviewed.  Patient left the clinic in stable condition.   Clinical History: MRI LUMBAR SPINE WITHOUT AND WITH CONTRAST   TECHNIQUE: Multiplanar and multiecho pulse sequences of the lumbar spine were obtained without and with intravenous contrast.   CONTRAST:  82mL GADAVIST GADOBUTROL 1 MMOL/ML IV SOLN   COMPARISON:  MRI of the lumbar spine November 10, 2018.   FINDINGS: Segmentation:  Standard.   Alignment:  Physiologic.   Vertebrae: No fracture, evidence of discitis, or bone lesion. Postsurgical changes from posterior transpedicular fixation and laminectomies from L2 through S1 with intervertebral cages at L2-3 and L3-4.   Conus medullaris and cauda equina: Conus extends to the T12-L1 level. Conus and cauda equina appear normal.   Paraspinal and other soft tissues:  Fluid collection in the posterior paraspinal soft tissues from the L2-3 level to the L5-S1 level measuring approximately 9 cm in length by 2.9 x 2.6 cm (T x AP), likely a seroma. No mass effect on the thecal sac.   Disc levels:   T12-L1: Disc bulge and mild facet degenerative changes without significant spinal canal or neural foraminal stenosis.   L1-2: Disc bulge and mild facet degenerative change without significant spinal canal or neural foraminal stenosis.   L2-3 through L5-S1: Postsurgical changes from interbody fusion, posterior fixation and laminectomies without residual spinal canal or neural foraminal stenosis.   IMPRESSION: 1. Postoperative changes from interbody fusion and posterior fixation from L2-3 through L5-S1 without residual spinal canal  or neural foraminal stenosis. 2. Mild interval progression of degenerative disc disease at T12-L1 and L1-2 without significant spinal canal or neural foraminal stenosis.     Electronically Signed   By: Pedro Earls M.D.   On: 05/31/2021 11:48   She reports that she has never smoked. She has never used smokeless tobacco. No results for input(s): HGBA1C, LABURIC in the last 8760 hours.  Objective:  VS:  HT:    WT:   BMI:     BP:(!) 156/85  HR:78bpm  TEMP: ( )  RESP:  Physical Exam Vitals and nursing note reviewed.  Constitutional:      General: She is not in acute distress.    Appearance: Normal appearance. She is obese. She is not ill-appearing.  HENT:     Head: Normocephalic and atraumatic.     Right Ear: External ear normal.     Left Ear: External ear normal.  Eyes:     Extraocular Movements: Extraocular movements intact.  Cardiovascular:     Rate and Rhythm: Normal rate.     Pulses: Normal pulses.  Pulmonary:     Effort: Pulmonary effort is normal. No respiratory distress.  Abdominal:     General: There is no distension.     Palpations: Abdomen is soft.  Musculoskeletal:        General: Tenderness present.     Cervical back: Neck supple.     Right lower leg: No edema.     Left lower leg: No edema.     Comments: Patient has good distal strength with no pain over the greater trochanters.  No clonus or focal weakness.  Skin:    Findings: No erythema, lesion or rash.  Neurological:     General: No focal deficit present.     Mental Status: She is alert and oriented to person, place, and time.     Sensory: No sensory deficit.     Motor: No weakness or abnormal muscle tone.     Coordination: Coordination normal.  Psychiatric:        Mood and Affect: Mood normal.        Behavior: Behavior normal.    Ortho Exam  Imaging: XR C-ARM NO REPORT  Result Date: 10/06/2021 Please see Notes tab for imaging impression.   Past  Medical/Family/Surgical/Social History: Medications & Allergies reviewed per EMR, new medications updated. 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   Lumbar stenosis with neurogenic claudication 02/04/2015   Stiffness of joints, not elsewhere classified, multiple sites 10/27/2012   Weakness of both legs 10/27/2012   Difficulty in walking(719.7) 10/27/2012   Past Medical History:  Diagnosis Date   Arthritis    Cataracts, bilateral    surgery to remove cataracts - bilateral   Chronic back pain    Hypertension    Stroke [facility]) 2019   Wears dentures    Family History  Problem Relation Age of Onset   Hypertension Mother    Stomach cancer Mother 96   Stomach cancer Maternal Aunt        late 68s   Breast cancer Neg Hx    Stroke Neg Hx    Colon cancer Neg Hx    Past Surgical History:  Procedure Laterality Date   ABDOMINAL HYSTERECTOMY  86   APPENDECTOMY     BACK SURGERY  2010   BACK SURGERY  2015   BACK SURGERY  06/2016   BIOPSY  03/11/2021   Procedure: BIOPSY;  Surgeon: Eloise Harman, DO;  [facility];  Service: Endoscopy;;   BIOPSY  05/06/2021   Procedure: BIOPSY;  Surgeon: Eloise Harman, DO;  [facility];  Service: Endoscopy;;   CATARACT EXTRACTION W/PHACO Left 01/07/2017   Procedure: CATARACT EXTRACTION PHACO AND INTRAOCULAR LENS PLACEMENT ([place]);  Surgeon: Tonny Branch, MD;  Location: [facility];  Service: Ophthalmology;  Laterality: Left;  left CDE 9.31    CATARACT  EXTRACTION W/PHACO Right 01/25/2017   Procedure: CATARACT EXTRACTION PHACO AND INTRAOCULAR LENS PLACEMENT ([place]) CDE:16.07;  Surgeon: Tonny Branch, MD;  Location: [facility];  Service: Ophthalmology;  Laterality: Right;  right   COLONOSCOPY  04/2008   Surgeon: Dr. Gala Romney; anal papilla, internal hemorrhoids, 5 mm polyp and diminutive polyp removed.  Pathology with 1 tubular adenoma and benign colonic mucosa with focal surface erosion.  Recommend repeat colonoscopy in 5 years.   COLONOSCOPY WITH PROPOFOL N/A 03/11/2021   Procedure: COLONOSCOPY WITH PROPOFOL;  Surgeon: Eloise Harman, DO;  [facility];  Service: Endoscopy;  Laterality: N/A;  PM   ESOPHAGOGASTRODUODENOSCOPY  04/2008   Surgeon: Dr. Gala Romney; circumferential distal esophageal erosions consistent with nonerosive reflux esophagitis, small hiatal hernia, antral erosions, area of scar versus adenomatous change s/p biopsy (reactive gastropathy, negative H. pylori), normal examined duodenum.   ESOPHAGOGASTRODUODENOSCOPY (EGD) WITH PROPOFOL N/A 03/11/2021   Procedure: ESOPHAGOGASTRODUODENOSCOPY (EGD) WITH PROPOFOL;  Surgeon: Eloise Harman, DO;  [facility];  Service: Endoscopy;  Laterality: N/A;   ESOPHAGOGASTRODUODENOSCOPY (EGD) WITH PROPOFOL N/A 05/06/2021   Procedure: ESOPHAGOGASTRODUODENOSCOPY (EGD) WITH PROPOFOL;  Surgeon: Eloise Harman, DO;  [facility];  Service: Endoscopy;  Laterality: N/A;  7:30am   FRACTURE SURGERY Right    arm  1974   LUMBAR WOUND DEBRIDEMENT N/A 09/23/2017   Procedure: Exploration of Lumbar Wound; Repair of Pseudomeningocele;  Surgeon: Newman Pies, MD;  Location: [place];  Service: Neurosurgery;  Laterality: N/A;   POLYPECTOMY  03/11/2021   Procedure: POLYPECTOMY INTESTINAL;  Surgeon: Eloise Harman, DO;  [facility];  Service: Endoscopy;;   TONSILLECTOMY     TOTAL KNEE ARTHROPLASTY Right 05/30/2019   Procedure: RIGHT TOTAL KNEE ARTHROPLASTY;  Surgeon: Mcarthur Rossetti, MD;  Location: [place];  Service: Orthopedics;  Laterality: Right;   TOTAL KNEE ARTHROPLASTY Left 10/17/2019   TOTAL KNEE  ARTHROPLASTY Left 10/17/2019   Procedure: LEFT TOTAL KNEE ARTHROPLASTY;  Surgeon: Mcarthur Rossetti, MD;  Location: [place];  Service: Orthopedics;  Laterality: Left;   Social History   Occupational History   Not on file  Tobacco Use   Smoking status: Never   Smokeless tobacco: Never  Vaping Use   Vaping Use: Never used  Substance and Sexual Activity   Alcohol use: No   Drug use: No   Sexual activity: Not Currently    Birth control/protection: Post-menopausal

## 2021-10-17 ENCOUNTER — Ambulatory Visit: Payer: Medicare Other | Admitting: Specialist

## 2021-10-17 ENCOUNTER — Encounter: Payer: Self-pay | Admitting: Specialist

## 2021-10-17 ENCOUNTER — Other Ambulatory Visit: Payer: Self-pay

## 2021-10-17 VITALS — BP 131/80 | HR 65 | Ht 64.0 in | Wt 220.0 lb

## 2021-10-17 DIAGNOSIS — M1611 Unilateral primary osteoarthritis, right hip: Secondary | ICD-10-CM

## 2021-10-17 DIAGNOSIS — M79651 Pain in right thigh: Secondary | ICD-10-CM | POA: Diagnosis not present

## 2021-10-17 DIAGNOSIS — M5417 Radiculopathy, lumbosacral region: Secondary | ICD-10-CM

## 2021-10-17 DIAGNOSIS — R29898 Other symptoms and signs involving the musculoskeletal system: Secondary | ICD-10-CM | POA: Diagnosis not present

## 2021-10-17 DIAGNOSIS — M48062 Spinal stenosis, lumbar region with neurogenic claudication: Secondary | ICD-10-CM

## 2021-10-17 DIAGNOSIS — M545 Low back pain, unspecified: Secondary | ICD-10-CM | POA: Diagnosis not present

## 2021-10-17 NOTE — Progress Notes (Signed)
Office Visit Note   Patient: [name]           Date of Birth: 01-Jul-1946           MRN: [id number] Visit Date: 10/17/2021              Requested by: Celene Squibb, MD [address] [place],  [place] [address] PCP: Celene Squibb, MD   Assessment & Plan: Visit Diagnoses:  1. Acute pain of right thigh   2. Low back pain, unspecified back pain laterality, unspecified chronicity, unspecified whether sciatica present   3. Weakness of both hips   4. Unilateral primary osteoarthritis, right hip   5. Lumbar stenosis with neurogenic claudication   6. Radiculopathy, lumbosacral region     Plan: Avoid bending, stooping and avoid lifting weights greater than 10 lbs. Avoid prolong standing and walking. Avoid frequent bending and stooping  No lifting greater than 10 lbs. May use ice or moist heat for pain. Weight loss is of benefit. Call if pain recurrs and we will have Dr. Ernestina Patches repeat the injections.    Follow-Up Instructions: No follow-ups on file.   Orders:  No orders of the defined types were placed in this encounter.  No orders of the defined types were placed in this encounter.     Procedures: No procedures performed   Clinical Data: No additional findings.   Subjective: Chief Complaint  Patient presents with   Lower Back - Follow-up    She had a Bilateral S1 injection with Dr. Ernestina Patches on 10/06/21, she states that it did help her, she states that she got 50-60% relief    75 year old female with history of previous L3 to S1 fusion and she has been experiencing pain in the buttocks and bilateral post thighs and calves. Pain is worse standing and walking. Underwent bilateral SI transforamenal ESIs with good pain relief 50-60 %. She reports being able to cut back on the use of the oxycodone. No bowel or bladder difficulty.   Review of Systems  Constitutional: Negative.   HENT: Negative.    Eyes: Negative.   Respiratory: Negative.    Cardiovascular: Negative.    Gastrointestinal: Negative.   Endocrine: Negative.   Genitourinary: Negative.   Musculoskeletal: Negative.   Skin: Negative.   Allergic/Immunologic: Negative.   Neurological: Negative.   Hematological: Negative.   Psychiatric/Behavioral: Negative.      Objective: Vital Signs: BP 131/80   Pulse 65   Ht 5\' 4"  (1.626 m)   Wt 220 lb (99.8 kg)   LMP  (LMP Unknown)   BMI 37.76 kg/m   Physical Exam Constitutional:      Appearance: She is well-developed.  HENT:     Head: Normocephalic and atraumatic.  Eyes:     Pupils: Pupils are equal, round, and reactive to light.  Pulmonary:     Effort: Pulmonary effort is normal.     Breath sounds: Normal breath sounds.  Abdominal:     General: Bowel sounds are normal.     Palpations: Abdomen is soft.  Musculoskeletal:     Cervical back: Normal range of motion and neck supple.     Lumbar back: Negative right straight leg raise test and negative left straight leg raise test.  Skin:    General: Skin is warm and dry.  Neurological:     Mental Status: She is alert and oriented to person, place, and time.  Psychiatric:  Behavior: Behavior normal.        Thought Content: Thought content normal.        Judgment: Judgment normal.   Back Exam   Tenderness  The patient is experiencing tenderness in the lumbar.  Range of Motion  Extension:  abnormal  Flexion:  abnormal  Lateral bend right:  abnormal  Lateral bend left:  abnormal  Rotation right:  abnormal  Rotation left:  abnormal   Muscle Strength  Right Quadriceps:  5/5  Left Quadriceps:  5/5  Right Hamstrings:  5/5  Left Hamstrings:  5/5   Tests  Straight leg raise right: negative Straight leg raise left: negative  Reflexes  Patellar:  2/4 Achilles:  2/4  Other  Toe walk: abnormal Heel walk: abnormal    Specialty Comments:  No specialty comments available.  Imaging: No results found.   PMFS History: Patient Active Problem List   Diagnosis Date Noted    H/O adenomatous polyp of colon 02/24/2021   Melena 02/24/2021   Status post total left knee replacement 10/17/2019   Status post total right knee replacement 05/30/2019   Unilateral primary osteoarthritis, left knee 04/18/2019   Unilateral primary osteoarthritis, right knee 04/18/2019   CVA (cerebral vascular accident) ([place]) 08/04/2018   Stroke ([place]) 08/04/2018   Chronic back pain 08/04/2018   Post-operative pain 09/20/2017   Postprocedural pseudomeningocele 09/20/2017   Lumbar stenosis with neurogenic claudication 02/04/2015   Stiffness of joints, not elsewhere classified, multiple sites 10/27/2012   Weakness of both legs 10/27/2012   Difficulty in walking(719.7) 10/27/2012   Past Medical History:  Diagnosis Date   Arthritis    Cataracts, bilateral    surgery to remove cataracts - bilateral   Chronic back pain    Hypertension    Stroke ([place]) 2019   Wears dentures     Family History  Problem Relation Age of Onset   Hypertension Mother    Stomach cancer Mother 80   Stomach cancer Maternal Aunt        late 11s   Breast cancer Neg Hx    Stroke Neg Hx    Colon cancer Neg Hx     Past Surgical History:  Procedure Laterality Date   ABDOMINAL HYSTERECTOMY  27   APPENDECTOMY     BACK SURGERY  2010   BACK SURGERY  2015   BACK SURGERY  06/2016   BIOPSY  03/11/2021   Procedure: BIOPSY;  Surgeon: Eloise Harman, DO;  [facility];  Service: Endoscopy;;   BIOPSY  05/06/2021   Procedure: BIOPSY;  Surgeon: Eloise Harman, DO;  [facility];  Service: Endoscopy;;   CATARACT EXTRACTION W/PHACO Left 01/07/2017   Procedure: CATARACT EXTRACTION PHACO AND INTRAOCULAR LENS PLACEMENT ([place]);  Surgeon: Tonny Branch, MD;  [facility]: [facility];  Service: Ophthalmology;  Laterality: Left;  left CDE 9.31    CATARACT EXTRACTION W/PHACO Right 01/25/2017   Procedure: CATARACT EXTRACTION PHACO AND INTRAOCULAR LENS PLACEMENT ([place]) CDE:16.07;  Surgeon: Tonny Branch, MD;  [facility];  Service: Ophthalmology;  Laterality: Right;  right   COLONOSCOPY  04/2008   Surgeon: Dr. Gala Romney; anal papilla, internal hemorrhoids, 5 mm polyp and diminutive polyp removed.  Pathology with 1 tubular adenoma and benign colonic mucosa with focal surface erosion.  Recommend repeat colonoscopy in 5 years.   COLONOSCOPY WITH PROPOFOL N/A 03/11/2021   Procedure: COLONOSCOPY WITH PROPOFOL;  Surgeon: Eloise Harman, DO;  [facility];  Service: Endoscopy;  Laterality:  N/A;  PM   ESOPHAGOGASTRODUODENOSCOPY  04/2008   Surgeon: Dr. Gala Romney; circumferential distal esophageal erosions consistent with nonerosive reflux esophagitis, small hiatal hernia, antral erosions, area of scar versus adenomatous change s/p biopsy (reactive gastropathy, negative H. pylori), normal examined duodenum.   ESOPHAGOGASTRODUODENOSCOPY (EGD) WITH PROPOFOL N/A 03/11/2021   Procedure: ESOPHAGOGASTRODUODENOSCOPY (EGD) WITH PROPOFOL;  Surgeon: Eloise Harman, DO;  [facility];  Service: Endoscopy;  Laterality: N/A;   ESOPHAGOGASTRODUODENOSCOPY (EGD) WITH PROPOFOL N/A 05/06/2021   Procedure: ESOPHAGOGASTRODUODENOSCOPY (EGD) WITH PROPOFOL;  Surgeon: Eloise Harman, DO;  [facility];  Service: Endoscopy;  Laterality: N/A;  7:30am   FRACTURE SURGERY Right    arm  1974   LUMBAR WOUND DEBRIDEMENT N/A 09/23/2017   Procedure: Exploration of Lumbar Wound; Repair of Pseudomeningocele;  Surgeon: Newman Pies, MD;  Location: [place];  Service: Neurosurgery;  Laterality: N/A;   POLYPECTOMY  03/11/2021   Procedure: POLYPECTOMY INTESTINAL;  Surgeon: Eloise Harman, DO;  [facility];  Service: Endoscopy;;   TONSILLECTOMY     TOTAL KNEE ARTHROPLASTY Right 05/30/2019   Procedure: RIGHT TOTAL KNEE ARTHROPLASTY;  Surgeon: Mcarthur Rossetti, MD;  Location: [facility];  Service: Orthopedics;  Laterality: Right;   TOTAL KNEE ARTHROPLASTY Left 10/17/2019   TOTAL KNEE ARTHROPLASTY Left 10/17/2019    Procedure: LEFT TOTAL KNEE ARTHROPLASTY;  Surgeon: Mcarthur Rossetti, MD;  Location: [place];  Service: Orthopedics;  Laterality: Left;   Social History   Occupational History   Not on file  Tobacco Use   Smoking status: Never   Smokeless tobacco: Never  Vaping Use   Vaping Use: Never used  Substance and Sexual Activity   Alcohol use: No   Drug use: No   Sexual activity: Not Currently    Birth control/protection: Post-menopausal

## 2021-10-17 NOTE — Patient Instructions (Addendum)
Plan: Avoid bending, stooping and avoid lifting weights greater than 10 lbs. Avoid prolong standing and walking. Avoid frequent bending and stooping  No lifting greater than 10 lbs. May use ice or moist heat for pain. Weight loss is of benefit. Call if pain recurrs and we will have Dr. Ernestina Patches repeat the injections. Call if pain recurrs and we will have Dr. Ernestina Patches repeat the injections.

## 2021-10-22 ENCOUNTER — Ambulatory Visit
Admission: RE | Admit: 2021-10-22 | Discharge: 2021-10-22 | Disposition: A | Discharge status: Home / Self Care | Payer: Medicare Other | Source: Ambulatory Visit | Attending: Internal Medicine | Admitting: Internal Medicine

## 2021-10-22 DIAGNOSIS — Z1231 Encounter for screening mammogram for malignant neoplasm of breast: Secondary | ICD-10-CM | POA: Diagnosis not present

## 2021-11-11 NOTE — Progress Notes (Deleted)
Referring Provider: Celene Squibb, MD Primary Care Physician:  Celene Squibb, MD Primary GI Physician: Dr. Gala Romney  No chief complaint on file.   HPI:   [name] is a 75 y.o. female with history of GERD, reflux esophagitis, adenomatous colon polyps, presenting today for follow-up on melena and anemia s/p EGD and colonoscopy.  Patient was last seen in our office 02/24/2021 and reported 4 days of black stools in March, but none since then.  She has been taking 81 mg aspirin daily due to history of stroke, but this was on hold.  Had not been on anything for GERD for years and denied GERD symptoms.  No other significant GI symptoms.  She had office visit with PCP in March who prescribed Protonix 40 mg twice daily, but she only took this for about 5 days and discontinued.  Labs completed with PCP on 3/22 revealed hemoglobin 10.9 (L), normocytic indices.  Ferritin 38, iron 52, iron saturation 15% (lower end of normal).  She was advised to resume Protonix 40 mg twice daily and was scheduled for EGD and colonoscopy.  Procedures completed 03/11/21: EGD: LA grade D esophagitis, gastritis s/p biopsied, normal examined duodenum.  Pathology with mild chronic gastritis, no H. pylori.  Recommended continuing current medications and repeating EGD in 8-12 weeks. Colonoscopy: Nonbleeding internal hemorrhoids, diverticulosis in sigmoid colon, three 1-2 mm polyps in ascending colon, two 1-2 mm polyp in the transverse colon, and a 4 mm polyp in transverse colon, all resected and retrieved.  Pathology revealed 2 tubular adenomas and benign colonic mucosa.  Recommended repeat colonoscopy in 5 years.  Repeat EGD 05/06/2021: Esophagitis healed, variable Z-line s/p biopsied, erythematous mucosa in the gastric body, normal examined duodenum.  Pathology with mild inflammation, no metaplasia, dysplasia, or carcinoma.  Recommended continuing PPI daily.  Today:   Past Medical History:  Diagnosis Date   Arthritis     Cataracts, bilateral    surgery to remove cataracts - bilateral   Chronic back pain    Hypertension    Stroke ([place]) 2019   Wears dentures     Past Surgical History:  Procedure Laterality Date   ABDOMINAL HYSTERECTOMY  1   APPENDECTOMY     BACK SURGERY  2010   BACK SURGERY  2015   BACK SURGERY  06/2016   BIOPSY  03/11/2021   Procedure: BIOPSY;  Surgeon: Eloise Harman, DO;  [facility];  Service: Endoscopy;;   BIOPSY  05/06/2021   Procedure: BIOPSY;  Surgeon: Eloise Harman, DO;  [facility];  Service: Endoscopy;;   CATARACT EXTRACTION W/PHACO Left 01/07/2017   Procedure: CATARACT EXTRACTION PHACO AND INTRAOCULAR LENS PLACEMENT ([place]);  Surgeon: Tonny Branch, MD;  [facility];  Service: Ophthalmology;  Laterality: Left;  left CDE 9.31    CATARACT EXTRACTION W/PHACO Right 01/25/2017   Procedure: CATARACT EXTRACTION PHACO AND INTRAOCULAR LENS PLACEMENT ([place]) CDE:16.07;  Surgeon: Tonny Branch, MD;  [facility];  Service: Ophthalmology;  Laterality: Right;  right   COLONOSCOPY  04/2008   Surgeon: Dr. Gala Romney; anal papilla, internal hemorrhoids, 5 mm polyp and diminutive polyp removed.  Pathology with 1 tubular adenoma and benign colonic mucosa with focal surface erosion.  Recommend repeat colonoscopy in 5 years.   COLONOSCOPY WITH PROPOFOL N/A 03/11/2021   Procedure: COLONOSCOPY WITH PROPOFOL;  Surgeon: Eloise Harman, DO;  [facility];  Service: Endoscopy;  Laterality: N/A;  PM   ESOPHAGOGASTRODUODENOSCOPY  04/2008  Surgeon: Dr. Gala Romney; circumferential distal esophageal erosions consistent with nonerosive reflux esophagitis, small hiatal hernia, antral erosions, area of scar versus adenomatous change s/p biopsy (reactive gastropathy, negative H. pylori), normal examined duodenum.   ESOPHAGOGASTRODUODENOSCOPY (EGD) WITH PROPOFOL N/A 03/11/2021   Procedure: ESOPHAGOGASTRODUODENOSCOPY (EGD) WITH PROPOFOL;  Surgeon: Eloise Harman, DO;  [facility];  Service: Endoscopy;  Laterality: N/A;   ESOPHAGOGASTRODUODENOSCOPY (EGD) WITH PROPOFOL N/A 05/06/2021   Procedure: ESOPHAGOGASTRODUODENOSCOPY (EGD) WITH PROPOFOL;  Surgeon: Eloise Harman, DO;  [facility];  Service: Endoscopy;  Laterality: N/A;  7:30am   FRACTURE SURGERY Right    arm  1974   LUMBAR WOUND DEBRIDEMENT N/A 09/23/2017   Procedure: Exploration of Lumbar Wound; Repair of Pseudomeningocele;  Surgeon: Newman Pies, MD;  Location: [place];  Service: Neurosurgery;  Laterality: N/A;   POLYPECTOMY  03/11/2021   Procedure: POLYPECTOMY INTESTINAL;  Surgeon: Eloise Harman, DO;  [facility];  Service: Endoscopy;;   TONSILLECTOMY     TOTAL KNEE ARTHROPLASTY Right 05/30/2019   Procedure: RIGHT TOTAL KNEE ARTHROPLASTY;  Surgeon: Mcarthur Rossetti, MD;  Location: [facility];  Service: Orthopedics;  Laterality: Right;   TOTAL KNEE ARTHROPLASTY Left 10/17/2019   TOTAL KNEE ARTHROPLASTY Left 10/17/2019   Procedure: LEFT TOTAL KNEE ARTHROPLASTY;  Surgeon: Mcarthur Rossetti, MD;  Location: [place];  Service: Orthopedics;  Laterality: Left;    Current Outpatient Medications  Medication Sig Dispense Refill   aspirin EC 81 MG tablet Take 81 mg by mouth daily. Swallow whole.     atenolol (TENORMIN) 25 MG tablet Take 25 mg by mouth daily.     diclofenac (VOLTAREN) 50 MG EC tablet Take 1 tablet (50 mg total) by mouth 2 (two) times daily. 60 tablet 3   misoprostol (CYTOTEC) 200 MCG tablet Take 1 tablet (200 mcg total) by mouth 2 (two) times daily at 8 am and 10 pm. 60 tablet 3   omeprazole (PRILOSEC) 40 MG capsule Take 1 capsule (40 mg total) by mouth daily. 30 capsule 5   oxyCODONE-acetaminophen (PERCOCET/ROXICET) 5-325 MG tablet TAKE 1 TO 2 TABLETS BY MOUTH EVERY 6 HOURS AS NEEDED FOR SEVERE pain (Patient taking differently: Take 0.5 tablets by mouth 2 (two) times daily as needed for severe pain.) 40 tablet 0   polyethylene glycol-electrolytes (TRILYTE)  420 g solution Take 4,000 mLs by mouth as directed. 4000 mL 0   rosuvastatin (CRESTOR) 5 MG tablet Take 5 mg by mouth daily.     No current facility-administered medications for this visit.    Allergies as of 11/13/2021 - Review Complete 10/17/2021  Allergen Reaction Noted   Flexeril [cyclobenzaprine] Nausea Only 09/24/2017   Hydrocodone Nausea Only 08/03/2017   Dexlansoprazole Rash 02/22/2021   Lyrica [pregabalin] Palpitations 08/03/2017   Neurontin [gabapentin] Palpitations 12/28/2016    Family History  Problem Relation Age of Onset   Hypertension Mother    Stomach cancer Mother 26   Stomach cancer Maternal Aunt        late 62s   Breast cancer Neg Hx    Stroke Neg Hx    Colon cancer Neg Hx     Social History   Socioeconomic History   Marital status: Married    Spouse name: Not on file   Number of children: Not on file   Years of education: Not on file   Highest education level: Not on file  Occupational History   Not on file  Tobacco Use   Smoking status:  Never   Smokeless tobacco: Never  Vaping Use   Vaping Use: Never used  Substance and Sexual Activity   Alcohol use: No   Drug use: No   Sexual activity: Not Currently    Birth control/protection: Post-menopausal  Other Topics Concern   Not on file  Social History Narrative   Not on file   Social Determinants of Health   Financial Resource Strain: Not on file  Food Insecurity: Not on file  Transportation Needs: Not on file  Physical Activity: Not on file  Stress: Not on file  Social Connections: Not on file    Review of Systems: Gen: Denies fever, chills, anorexia. Denies fatigue, weakness, weight loss.  CV: Denies chest pain, palpitations, syncope, peripheral edema, and claudication. Resp: Denies dyspnea at rest, cough, wheezing, coughing up blood, and pleurisy. GI: Denies vomiting blood, jaundice, and fecal incontinence.   Denies dysphagia or odynophagia. Derm: Denies rash, itching, dry  skin Psych: Denies depression, anxiety, memory loss, confusion. No homicidal or suicidal ideation.  Heme: Denies bruising, bleeding, and enlarged lymph nodes.  Physical Exam: LMP  (LMP Unknown)  General:   Alert and oriented. No distress noted. Pleasant and cooperative.  Head:  Normocephalic and atraumatic. Eyes:  Conjuctiva clear without scleral icterus. Mouth:  Oral mucosa pink and moist. Good dentition. No lesions. Heart:  S1, S2 present without murmurs appreciated. Lungs:  Clear to auscultation bilaterally. No wheezes, rales, or rhonchi. No distress.  Abdomen:  +BS, soft, non-tender and non-distended. No rebound or guarding. No HSM or masses noted. Msk:  Symmetrical without gross deformities. Normal posture. Extremities:  Without edema. Neurologic:  Alert and  oriented x4 Psych:  Alert and cooperative. Normal mood and affect.

## 2021-11-13 ENCOUNTER — Ambulatory Visit: Payer: Medicare Other | Admitting: Gastroenterology

## 2021-11-14 DIAGNOSIS — I10 Essential (primary) hypertension: Secondary | ICD-10-CM | POA: Diagnosis not present

## 2021-11-14 DIAGNOSIS — E782 Mixed hyperlipidemia: Secondary | ICD-10-CM | POA: Diagnosis not present

## 2021-11-25 ENCOUNTER — Encounter: Payer: Self-pay | Admitting: Cardiology

## 2021-11-25 NOTE — Progress Notes (Signed)
Cardiology Office Note  Date: 11/26/2021   ID: EMALEA MIX, DOB 21-Sep-1946, MRN [id number]  PCP:  Celene Squibb, MD  Cardiologist:  Rozann Lesches, MD Electrophysiologist:  None   Chief Complaint  Patient presents with   Referred for evaluation of heart murmur    History of Present Illness: CALIYAH SIEH is a 76 y.o. female referred for cardiology consultation by Dr. Nevada Crane for evaluation of cardiac murmur.  She is here today with her husband.  She does not report any longstanding history of heart murmur.  No major functional limitations in terms of shortness of breath or chest discomfort, no syncope.  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  I reviewed the chart.  Echocardiogram from 2019 revealed LVEF 55 to 60% with mildly calcified aortic valve, trivial mitral regurgitation.  I went over her medications which are noted below.  She had lab work in September 2022, reports compliance with therapy.  I personally reviewed her ECG today which shows sinus rhythm with low voltage.  Past Medical History:  Diagnosis Date   Arthritis    Cataracts, bilateral    Chronic back pain    Essential hypertension    GERD (gastroesophageal reflux disease)    Impaired fasting glucose    Iron deficiency anemia    Mixed hyperlipidemia    Spinal stenosis    Stroke ([place]) 2019   Wears dentures     Past Surgical History:  Procedure Laterality Date   ABDOMINAL HYSTERECTOMY  47   APPENDECTOMY     BACK SURGERY  2010   BACK SURGERY  2015   BACK SURGERY  06/2016   BIOPSY  03/11/2021   Procedure: BIOPSY;  Surgeon: Eloise Harman, DO;  [facility];  Service: Endoscopy;;   BIOPSY  05/06/2021   Procedure: BIOPSY;  Surgeon: Eloise Harman, DO;  [facility];  Service: Endoscopy;;   CATARACT EXTRACTION W/PHACO Left 01/07/2017   Procedure: CATARACT EXTRACTION PHACO AND INTRAOCULAR LENS PLACEMENT ([place]);  Surgeon: Tonny Branch, MD;  [facility]: [facility];   Service: Ophthalmology;  Laterality: Left;  left CDE 9.31    CATARACT EXTRACTION W/PHACO Right 01/25/2017   Procedure: CATARACT EXTRACTION PHACO AND INTRAOCULAR LENS PLACEMENT ([place]) CDE:16.07;  Surgeon: Tonny Branch, MD;  [facility];  Service: Ophthalmology;  Laterality: Right;  right   COLONOSCOPY  04/2008   Surgeon: Dr. Gala Romney; anal papilla, internal hemorrhoids, 5 mm polyp and diminutive polyp removed.  Pathology with 1 tubular adenoma and benign colonic mucosa with focal surface erosion.  Recommend repeat colonoscopy in 5 years.   COLONOSCOPY WITH PROPOFOL N/A 03/11/2021   Procedure: COLONOSCOPY WITH PROPOFOL;  Surgeon: Eloise Harman, DO;  [facility];  Service: Endoscopy;  Laterality: N/A;  PM   ESOPHAGOGASTRODUODENOSCOPY  04/2008   Surgeon: Dr. Gala Romney; circumferential distal esophageal erosions consistent with nonerosive reflux esophagitis, small hiatal hernia, antral erosions, area of scar versus adenomatous change s/p biopsy (reactive gastropathy, negative H. pylori), normal examined duodenum.   ESOPHAGOGASTRODUODENOSCOPY (EGD) WITH PROPOFOL N/A 03/11/2021   Procedure: ESOPHAGOGASTRODUODENOSCOPY (EGD) WITH PROPOFOL;  Surgeon: Eloise Harman, DO;  [facility];  Service: Endoscopy;  Laterality: N/A;   ESOPHAGOGASTRODUODENOSCOPY (EGD) WITH PROPOFOL N/A 05/06/2021   Procedure: ESOPHAGOGASTRODUODENOSCOPY (EGD) WITH PROPOFOL;  Surgeon: Eloise Harman, DO;  [facility];  Service: Endoscopy;  Laterality: N/A;  7:30am   FRACTURE SURGERY Right    arm  1974  LUMBAR WOUND DEBRIDEMENT N/A 09/23/2017   Procedure: Exploration of Lumbar Wound; Repair of Pseudomeningocele;  Surgeon: Newman Pies, MD;  Location: [facility];  Service: Neurosurgery;  Laterality: N/A;   POLYPECTOMY  03/11/2021   Procedure: POLYPECTOMY INTESTINAL;  Surgeon: Eloise Harman, DO;  [facility];  Service: Endoscopy;;   TONSILLECTOMY     TOTAL KNEE ARTHROPLASTY Right  05/30/2019   Procedure: RIGHT TOTAL KNEE ARTHROPLASTY;  Surgeon: Mcarthur Rossetti, MD;  Location: [facility];  Service: Orthopedics;  Laterality: Right;   TOTAL KNEE ARTHROPLASTY Left 10/17/2019   TOTAL KNEE ARTHROPLASTY Left 10/17/2019   Procedure: LEFT TOTAL KNEE ARTHROPLASTY;  Surgeon: Mcarthur Rossetti, MD;  Location: [facility];  Service: Orthopedics;  Laterality: Left;    Current Outpatient Medications  Medication Sig Dispense Refill   aspirin EC 81 MG tablet Take 81 mg by mouth daily. Swallow whole.     atenolol (TENORMIN) 25 MG tablet Take 25 mg by mouth daily.     rosuvastatin (CRESTOR) 5 MG tablet Take 5 mg by mouth daily.     omeprazole (PRILOSEC) 40 MG capsule Take 1 capsule (40 mg total) by mouth daily. (Patient not taking: Reported on 11/26/2021) 30 capsule 5   No current facility-administered medications for this visit.   Allergies:  Flexeril [cyclobenzaprine], Hydrocodone, Dexlansoprazole, Lyrica [pregabalin], and Neurontin [gabapentin]   Social History: The patient  reports that she has never smoked. She has never used smokeless tobacco. She reports that she does not drink alcohol and does not use drugs.   Family History: The patient's family history includes Hypertension in her mother; Stomach cancer in her maternal aunt; Stomach cancer (age of onset: 36) in her mother.   ROS: No palpitations or syncope.  Chronic back pain.  Physical Exam: VS:  BP (!) 146/88    Pulse 68    Ht 5\' 4"  (1.626 m)    Wt 220 lb 6.4 oz (100 kg)    LMP  (LMP Unknown)    SpO2 97%    BMI 37.83 kg/m , BMI Body mass index is 37.83 kg/m.  Wt Readings from Last 3 Encounters:  11/26/21 220 lb 6.4 oz (100 kg)  10/17/21 220 lb (99.8 kg)  08/14/21 220 lb (99.8 kg)    General: Patient appears comfortable at rest. HEENT: Conjunctiva and lids normal, wearing a mask. Neck: Supple, no elevated JVP or carotid bruits, no thyromegaly. Lungs: Clear to auscultation, nonlabored breathing at rest. Cardiac:  Regular rate and rhythm, no S3, 3/6 systolic murmur in aortic position, no pericardial rub. Abdomen: Soft, nontender, bowel sounds present. Extremities: No pitting edema, distal pulses 2+. Skin: Warm and dry. Musculoskeletal: No kyphosis. Neuropsychiatric: Alert and oriented x3, affect grossly appropriate.  ECG:  An ECG dated 03/07/2021 was personally reviewed today and demonstrated:  Sinus rhythm with borderline low voltage.  Recent Labwork: 03/07/2021: BUN 26; Creatinine, Ser 1.03; Hemoglobin 10.6; Platelets 205; Potassium 4.2; Sodium 138     Component Value Date/Time   CHOL 258 (H) 08/05/2018 0453   TRIG 109 08/05/2018 0453   HDL 54 08/05/2018 0453   CHOLHDL 4.8 08/05/2018 0453   VLDL 22 08/05/2018 0453   LDLCALC 182 (H) 08/05/2018 0453  September 2022: Hemoglobin A1c 6.2%, cholesterol 173, triglycerides 122, HDL 44, LDL 107, BUN 20, creatinine 1.05, potassium 4.7, AST 16, ALT 14, hemoglobin 10.7, platelets 218  Other Studies Reviewed Today:  Echocardiogram 08/05/2018: - Left ventricle: The cavity size was normal. Wall thickness was    increased  in a pattern of mild LVH. Systolic function was normal.    The estimated ejection fraction was in the range of 55% to 60%.    Wall motion was normal; there were no regional wall motion    abnormalities. Doppler parameters are consistent with abnormal    left ventricular relaxation (grade 1 diastolic dysfunction).  - Aortic valve: Mildly calcified annulus. Probably trileaflet;    mildly calcified leaflets.  - Mitral valve: There was trivial regurgitation.  - Left atrium: The atrium was mildly dilated.  - Right atrium: Central venous pressure (est): 3 mm Hg.  - Atrial septum: No defect or patent foramen ovale was identified.  - Tricuspid valve: There was trivial regurgitation.  - Pulmonary arteries: Systolic pressure could not be accurately    estimated.  - Pericardium, extracardiac: There was no pericardial effusion.   Assessment and  Plan:  1.  Cardiac murmur in aortic position.  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  Not obviously symptomatic at this point however.  2.  Essential hypertension, at this point on atenolol with follow-up by Dr. Nevada Crane.  Systolic is mildly elevated today.  3.  Mixed hyperlipidemia on Crestor.  Last LDL down to 107 from 182.  Medication Adjustments/Labs and Tests Ordered: Current medicines are reviewed at length with the patient today.  Concerns regarding medicines are outlined above.   Tests Ordered: Orders Placed This Encounter  Procedures   EKG 12-Lead   ECHOCARDIOGRAM COMPLETE    Medication Changes: No orders of the defined types were placed in this encounter.   Disposition:  Follow up  test results  Signed, Satira Sark, MD, [facility] 11/26/2021 9:18 AM    [facility] at [facility] [address] [place], [place] [address] Phone: [phone]; Fax: [phone]

## 2021-11-26 ENCOUNTER — Ambulatory Visit: Payer: Medicare Other | Admitting: Cardiology

## 2021-11-26 ENCOUNTER — Other Ambulatory Visit: Payer: Self-pay

## 2021-11-26 ENCOUNTER — Encounter: Payer: Self-pay | Admitting: Cardiology

## 2021-11-26 VITALS — BP 146/88 | HR 68 | Ht 64.0 in | Wt 220.4 lb

## 2021-11-26 DIAGNOSIS — E782 Mixed hyperlipidemia: Secondary | ICD-10-CM

## 2021-11-26 DIAGNOSIS — I10 Essential (primary) hypertension: Secondary | ICD-10-CM

## 2021-11-26 DIAGNOSIS — R011 Cardiac murmur, unspecified: Secondary | ICD-10-CM

## 2021-11-26 NOTE — Patient Instructions (Addendum)

## 2021-12-24 DIAGNOSIS — L821 Other seborrheic keratosis: Secondary | ICD-10-CM | POA: Diagnosis not present

## 2021-12-24 DIAGNOSIS — I781 Nevus, non-neoplastic: Secondary | ICD-10-CM | POA: Diagnosis not present

## 2021-12-25 ENCOUNTER — Ambulatory Visit (HOSPITAL_COMMUNITY)
Admission: RE | Admit: 2021-12-25 | Discharge: 2021-12-25 | Disposition: A | Discharge status: Home / Self Care | Payer: Medicare Other | Source: Ambulatory Visit | Attending: Cardiology | Admitting: Cardiology

## 2021-12-25 DIAGNOSIS — R011 Cardiac murmur, unspecified: Secondary | ICD-10-CM | POA: Diagnosis not present

## 2021-12-25 LAB — ECHOCARDIOGRAM COMPLETE
Area-P 1/2: 2.26 cm2
S' Lateral: 2.7 cm

## 2021-12-25 NOTE — Progress Notes (Signed)
*  PRELIMINARY RESULTS* Echocardiogram 2D Echocardiogram has been performed.  [name] 12/25/2021, 10:04 AM

## 2021-12-27 DIAGNOSIS — J069 Acute upper respiratory infection, unspecified: Secondary | ICD-10-CM | POA: Diagnosis not present

## 2021-12-27 DIAGNOSIS — R229 Localized swelling, mass and lump, unspecified: Secondary | ICD-10-CM | POA: Diagnosis not present

## 2021-12-29 ENCOUNTER — Other Ambulatory Visit: Payer: Self-pay | Admitting: Nurse Practitioner

## 2021-12-29 DIAGNOSIS — R229 Localized swelling, mass and lump, unspecified: Secondary | ICD-10-CM

## 2022-01-02 ENCOUNTER — Ambulatory Visit (HOSPITAL_COMMUNITY)
Admission: RE | Admit: 2022-01-02 | Discharge: 2022-01-02 | Disposition: A | Discharge status: Home / Self Care | Payer: Medicare Other | Source: Ambulatory Visit | Attending: Nurse Practitioner | Admitting: Nurse Practitioner

## 2022-01-02 ENCOUNTER — Other Ambulatory Visit: Payer: Self-pay

## 2022-01-02 DIAGNOSIS — R229 Localized swelling, mass and lump, unspecified: Secondary | ICD-10-CM | POA: Insufficient documentation

## 2022-01-02 DIAGNOSIS — R222 Localized swelling, mass and lump, trunk: Secondary | ICD-10-CM | POA: Diagnosis not present

## 2022-01-06 DIAGNOSIS — R590 Localized enlarged lymph nodes: Secondary | ICD-10-CM | POA: Diagnosis not present

## 2022-01-06 DIAGNOSIS — R229 Localized swelling, mass and lump, unspecified: Secondary | ICD-10-CM | POA: Diagnosis not present

## 2022-01-06 DIAGNOSIS — J069 Acute upper respiratory infection, unspecified: Secondary | ICD-10-CM | POA: Diagnosis not present

## 2022-01-13 DIAGNOSIS — I10 Essential (primary) hypertension: Secondary | ICD-10-CM | POA: Diagnosis not present

## 2022-01-13 DIAGNOSIS — E782 Mixed hyperlipidemia: Secondary | ICD-10-CM | POA: Diagnosis not present

## 2022-02-26 DIAGNOSIS — Z136 Encounter for screening for cardiovascular disorders: Secondary | ICD-10-CM | POA: Diagnosis not present

## 2022-02-26 DIAGNOSIS — R7301 Impaired fasting glucose: Secondary | ICD-10-CM | POA: Diagnosis not present

## 2022-02-26 DIAGNOSIS — D509 Iron deficiency anemia, unspecified: Secondary | ICD-10-CM | POA: Diagnosis not present

## 2022-03-04 DIAGNOSIS — I10 Essential (primary) hypertension: Secondary | ICD-10-CM | POA: Diagnosis not present

## 2022-03-04 DIAGNOSIS — Z96652 Presence of left artificial knee joint: Secondary | ICD-10-CM | POA: Diagnosis not present

## 2022-03-04 DIAGNOSIS — R011 Cardiac murmur, unspecified: Secondary | ICD-10-CM | POA: Diagnosis not present

## 2022-03-04 DIAGNOSIS — R7301 Impaired fasting glucose: Secondary | ICD-10-CM | POA: Diagnosis not present

## 2022-03-04 DIAGNOSIS — E782 Mixed hyperlipidemia: Secondary | ICD-10-CM | POA: Diagnosis not present

## 2022-03-04 DIAGNOSIS — K219 Gastro-esophageal reflux disease without esophagitis: Secondary | ICD-10-CM | POA: Diagnosis not present

## 2022-03-04 DIAGNOSIS — D509 Iron deficiency anemia, unspecified: Secondary | ICD-10-CM | POA: Diagnosis not present

## 2022-03-04 DIAGNOSIS — Z96651 Presence of right artificial knee joint: Secondary | ICD-10-CM | POA: Diagnosis not present

## 2022-03-04 DIAGNOSIS — M48062 Spinal stenosis, lumbar region with neurogenic claudication: Secondary | ICD-10-CM | POA: Diagnosis not present

## 2022-03-04 DIAGNOSIS — G4700 Insomnia, unspecified: Secondary | ICD-10-CM | POA: Diagnosis not present

## 2022-03-04 DIAGNOSIS — N1831 Chronic kidney disease, stage 3a: Secondary | ICD-10-CM | POA: Diagnosis not present

## 2022-04-15 ENCOUNTER — Ambulatory Visit: Payer: Medicare Other | Admitting: Physician Assistant

## 2022-04-15 ENCOUNTER — Encounter: Payer: Self-pay | Admitting: Physician Assistant

## 2022-04-15 ENCOUNTER — Ambulatory Visit (INDEPENDENT_AMBULATORY_CARE_PROVIDER_SITE_OTHER): Payer: Medicare Other

## 2022-04-15 DIAGNOSIS — M25551 Pain in right hip: Secondary | ICD-10-CM

## 2022-04-15 NOTE — Addendum Note (Signed)
Addended by: Robyne Peers on: 04/15/2022 04:11 PM   Modules accepted: Orders

## 2022-04-15 NOTE — Progress Notes (Signed)
Office Visit Note   Patient: SYMIAH NOWOTNY           Date of Birth: 1946/07/26           MRN: [id number] Visit Date: 04/15/2022              Requested by: Celene Squibb, MD [place],  [place] [id number] PCP: Celene Squibb, MD   Assessment & Plan: Visit Diagnoses:  1. Pain in right hip     Plan: We will obtain an MRI of her right hip to evaluate the cartilage and also rule out occult fracture given her sudden onset of pain.  She will follow-up with Dr. Delilah Shan after the MRI to go over results and discuss further treatment.  Questions were encouraged and answered.  She will continue her oxycodone that she is taking for pain  Follow-Up Instructions: Return After MRI.   Orders:  Orders Placed This Encounter  Procedures   XR HIP UNILAT W OR W/O PELVIS 2-3 VIEWS RIGHT   No orders of the defined types were placed in this encounter.     Procedures: No procedures performed   Clinical Data: No additional findings.   Subjective: Chief Complaint  Patient presents with   Right Hip - Pain    HPI Mrs. Kozub comes in today complaining of right leg and hip pain.  She states she has had pain in the entire right leg for at least the last 2 months but over the weekend she developed severe pain and giving way of the right hip.  Is having now groin pain which she did not have in the past.  She denies any new injury.  She is walking with a cane.  She denies any new injuries.  She states epidural steroid injection drug injections in the past given her no relief with the leg pain and on the right. Review of Systems See HPI  Objective: Vital Signs: LMP  (LMP Unknown)   Physical Exam Constitutional:      Appearance: She is not ill-appearing or diaphoretic.  Pulmonary:     Effort: Pulmonary effort is normal.  Neurological:     Mental Status: She is alert and oriented to person, place, and time.  Psychiatric:        Mood and Affect: Mood normal.    Ortho Exam Good range  of motion without pain.  Right hip significant guarding and pain with any attempts of external and internal rotation.  Circumflexion of the the left hip causes no pain.  Right hip causes severe pain. Specialty Comments:  No specialty comments available.  Imaging: [facility] pelvis lateral view of the right hip: No acute fractures no acute findings.  Bilateral hips well located.  Periarticular spurring weightbearing aspect of the right hip.  Hip joint overall well-maintained.  Lumbar hardware present.   PMFS History: Patient Active Problem List   Diagnosis Date Noted   H/O adenomatous polyp of colon 02/24/2021   Melena 02/24/2021   Status post total left knee replacement 10/17/2019   Status post total right knee replacement 05/30/2019   Unilateral primary osteoarthritis, left knee 04/18/2019   Unilateral primary osteoarthritis, right knee 04/18/2019   CVA (cerebral vascular accident) ([place]) 08/04/2018   Stroke ([place]) 08/04/2018   Chronic back pain 08/04/2018   Post-operative pain 09/20/2017   Postprocedural pseudomeningocele 09/20/2017   Lumbar stenosis with neurogenic claudication 02/04/2015   Stiffness of joints, not elsewhere classified, multiple sites 10/27/2012   Weakness  of both legs 10/27/2012   Difficulty in walking(719.7) 10/27/2012   Past Medical History:  Diagnosis Date   Arthritis    Cataracts, bilateral    Chronic back pain    Essential hypertension    GERD (gastroesophageal reflux disease)    Impaired fasting glucose    Iron deficiency anemia    Mixed hyperlipidemia    Spinal stenosis    Stroke [facility]) 2019   Wears dentures     Family History  Problem Relation Age of Onset   Hypertension Mother    Stomach cancer Mother 45   Stomach cancer Maternal Aunt        late 3s   Breast cancer Neg Hx    Stroke Neg Hx    Colon cancer Neg Hx     Past Surgical History:  Procedure Laterality Date   ABDOMINAL HYSTERECTOMY  71   APPENDECTOMY     BACK SURGERY  2010   BACK  SURGERY  2015   BACK SURGERY  06/2016   BIOPSY  03/11/2021   Procedure: BIOPSY;  Surgeon: Eloise Harman, DO;  [facility];  Service: Endoscopy;;   BIOPSY  05/06/2021   Procedure: BIOPSY;  Surgeon: Eloise Harman, DO;  [facility];  Service: Endoscopy;;   CATARACT EXTRACTION W/PHACO Left 01/07/2017   Procedure: CATARACT EXTRACTION PHACO AND INTRAOCULAR LENS PLACEMENT ([place]);  Surgeon: Tonny Branch, MD;  [facility];  Service: Ophthalmology;  Laterality: Left;  left CDE 9.31    CATARACT EXTRACTION W/PHACO Right 01/25/2017   Procedure: CATARACT EXTRACTION PHACO AND INTRAOCULAR LENS PLACEMENT ([place]) CDE:16.07;  Surgeon: Tonny Branch, MD;  [facility]: [facility];  Service: Ophthalmology;  Laterality: Right;  right   COLONOSCOPY  04/2008   Surgeon: Dr. Gala Romney; anal papilla, internal hemorrhoids, 5 mm polyp and diminutive polyp removed.  Pathology with 1 tubular adenoma and benign colonic mucosa with focal surface erosion.  Recommend repeat colonoscopy in 5 years.   COLONOSCOPY WITH PROPOFOL N/A 03/11/2021   Procedure: COLONOSCOPY WITH PROPOFOL;  Surgeon: Eloise Harman, DO;  [facility];  Service: Endoscopy;  Laterality: N/A;  PM   ESOPHAGOGASTRODUODENOSCOPY  04/2008   Surgeon: Dr. Gala Romney; circumferential distal esophageal erosions consistent with nonerosive reflux esophagitis, small hiatal hernia, antral erosions, area of scar versus adenomatous change s/p biopsy (reactive gastropathy, negative H. pylori), normal examined duodenum.   ESOPHAGOGASTRODUODENOSCOPY (EGD) WITH PROPOFOL N/A 03/11/2021   Procedure: ESOPHAGOGASTRODUODENOSCOPY (EGD) WITH PROPOFOL;  Surgeon: Eloise Harman, DO;  [facility];  Service: Endoscopy;  Laterality: N/A;   ESOPHAGOGASTRODUODENOSCOPY (EGD) WITH PROPOFOL N/A 05/06/2021   Procedure: ESOPHAGOGASTRODUODENOSCOPY (EGD) WITH PROPOFOL;  Surgeon: Eloise Harman, DO;  [facility];  Service: Endoscopy;  Laterality: N/A;   7:30am   FRACTURE SURGERY Right    arm  1974   LUMBAR WOUND DEBRIDEMENT N/A 09/23/2017   Procedure: Exploration of Lumbar Wound; Repair of Pseudomeningocele;  Surgeon: Newman Pies, MD;  Location: [place];  Service: Neurosurgery;  Laterality: N/A;   POLYPECTOMY  03/11/2021   Procedure: POLYPECTOMY INTESTINAL;  Surgeon: Eloise Harman, DO;  [facility];  Service: Endoscopy;;   TONSILLECTOMY     TOTAL KNEE ARTHROPLASTY Right 05/30/2019   Procedure: RIGHT TOTAL KNEE ARTHROPLASTY;  Surgeon: Mcarthur Rossetti, MD;  Location: [place];  Service: Orthopedics;  Laterality: Right;   TOTAL KNEE ARTHROPLASTY Left 10/17/2019   TOTAL KNEE ARTHROPLASTY Left 10/17/2019   Procedure: LEFT TOTAL KNEE ARTHROPLASTY;  Surgeon:  Mcarthur Rossetti, MD;  Location: [facility];  Service: Orthopedics;  Laterality: Left;   Social History   Occupational History   Not on file  Tobacco Use   Smoking status: Never   Smokeless tobacco: Never  Vaping Use   Vaping Use: Never used  Substance and Sexual Activity   Alcohol use: No   Drug use: No   Sexual activity: Not Currently    Birth control/protection: Post-menopausal

## 2022-04-16 ENCOUNTER — Telehealth: Payer: Self-pay | Admitting: Orthopaedic Surgery

## 2022-04-16 ENCOUNTER — Ambulatory Visit
Admission: RE | Admit: 2022-04-16 | Discharge: 2022-04-16 | Disposition: A | Discharge status: Home / Self Care | Payer: Medicare Other | Source: Ambulatory Visit | Attending: Orthopaedic Surgery | Admitting: Orthopaedic Surgery

## 2022-04-16 DIAGNOSIS — M25551 Pain in right hip: Secondary | ICD-10-CM | POA: Diagnosis not present

## 2022-04-16 NOTE — Telephone Encounter (Signed)
Pt called requesting a call back to have MRI results over the phone. Pt states if is unacceptable that dr availability until June 12 because she is unable to walk. I explained to pt that we can only go by available dates and times Dr's and PA's have she stated she need an appt and hung up the phone. Pt phone number is [phone]

## 2022-04-17 NOTE — Telephone Encounter (Signed)
Patient aware and coming in next week

## 2022-04-20 ENCOUNTER — Ambulatory Visit: Payer: Medicare Other | Admitting: Orthopaedic Surgery

## 2022-04-20 ENCOUNTER — Other Ambulatory Visit: Payer: Self-pay

## 2022-04-20 DIAGNOSIS — M25551 Pain in right hip: Secondary | ICD-10-CM | POA: Diagnosis not present

## 2022-04-20 DIAGNOSIS — M545 Low back pain, unspecified: Secondary | ICD-10-CM

## 2022-04-20 NOTE — Progress Notes (Signed)
  The patient comes in today to go over MRI of her right hip.  She has debilitating pain in the right hip but really in her right thigh going all the way down her foot and ankle with numbness and tingling and severe chronic pain.  She has had a significant lumbar spine operation and she said the neurosurgeon said there is really nothing else that they could recommend for her spine.  Last year she had an intra-articular injection in her right hip and an injection in her back and she has been these injections have helped her at all.  She is ambulate with a cane.  She is 76 years old and said things are getting severely worse for her where she cannot walk.  On my exam today there is no blocks to rotation of her right hip.  She has a lot of guarding and is difficult to tell where the pain is coming from but it does not seem to be of the hip is much as other components of radicular type of pain.  The MRI of her right hip shows some moderate arthritic changes and I pulled up the x-rays and there is no hip joint effusion and there is really no edema in the femoral head or acetabulum that would be the source of severe acute pain.  I would like to set her up for an intra-articular injection in her right hip joint under fluoroscopy.  I would also like to to refer her to Dr. Rennis Harding of spine and scoliosis specialist to take a look at her lumbar spine to see if there is anything he could recommend.  I am not sure that the joint replacement would really help her at all in terms of all the other pain that she is describing.  I am happy to see her back after the steroid injection in her right hip and this can be set up when she has that injection and I will see her back about 2 weeks later.

## 2022-04-28 ENCOUNTER — Other Ambulatory Visit: Payer: Self-pay | Admitting: Student in an Organized Health Care Education/Training Program

## 2022-04-28 DIAGNOSIS — M4326 Fusion of spine, lumbar region: Secondary | ICD-10-CM

## 2022-04-28 DIAGNOSIS — M1611 Unilateral primary osteoarthritis, right hip: Secondary | ICD-10-CM | POA: Diagnosis not present

## 2022-04-28 DIAGNOSIS — M79604 Pain in right leg: Secondary | ICD-10-CM | POA: Diagnosis not present

## 2022-05-03 ENCOUNTER — Ambulatory Visit
Admission: RE | Admit: 2022-05-03 | Discharge: 2022-05-03 | Disposition: A | Discharge status: Home / Self Care | Payer: Medicare Other | Source: Ambulatory Visit | Attending: Student in an Organized Health Care Education/Training Program | Admitting: Student in an Organized Health Care Education/Training Program

## 2022-05-03 DIAGNOSIS — R29898 Other symptoms and signs involving the musculoskeletal system: Secondary | ICD-10-CM | POA: Diagnosis not present

## 2022-05-03 DIAGNOSIS — M4326 Fusion of spine, lumbar region: Secondary | ICD-10-CM

## 2022-05-03 DIAGNOSIS — R200 Anesthesia of skin: Secondary | ICD-10-CM | POA: Diagnosis not present

## 2022-05-05 ENCOUNTER — Telehealth: Payer: Self-pay | Admitting: Physical Medicine and Rehabilitation

## 2022-05-05 DIAGNOSIS — M4326 Fusion of spine, lumbar region: Secondary | ICD-10-CM | POA: Diagnosis not present

## 2022-05-05 DIAGNOSIS — M1611 Unilateral primary osteoarthritis, right hip: Secondary | ICD-10-CM | POA: Diagnosis not present

## 2022-05-05 NOTE — Telephone Encounter (Signed)
Patient called. Would like to cancel appointment with Dr. Ernestina Patches. She had an injection already.

## 2022-05-26 ENCOUNTER — Encounter: Payer: Medicare Other | Admitting: Physical Medicine and Rehabilitation

## 2022-06-02 DIAGNOSIS — M1611 Unilateral primary osteoarthritis, right hip: Secondary | ICD-10-CM | POA: Diagnosis not present

## 2022-06-02 DIAGNOSIS — M4326 Fusion of spine, lumbar region: Secondary | ICD-10-CM | POA: Diagnosis not present

## 2022-08-06 DIAGNOSIS — M961 Postlaminectomy syndrome, not elsewhere classified: Secondary | ICD-10-CM | POA: Diagnosis not present

## 2022-08-06 DIAGNOSIS — M4326 Fusion of spine, lumbar region: Secondary | ICD-10-CM | POA: Diagnosis not present

## 2022-08-06 DIAGNOSIS — G894 Chronic pain syndrome: Secondary | ICD-10-CM | POA: Diagnosis not present

## 2022-08-12 ENCOUNTER — Other Ambulatory Visit: Payer: Self-pay | Admitting: Orthopaedic Surgery

## 2022-08-12 DIAGNOSIS — M47894 Other spondylosis, thoracic region: Secondary | ICD-10-CM

## 2022-08-17 ENCOUNTER — Ambulatory Visit
Admission: RE | Admit: 2022-08-17 | Discharge: 2022-08-17 | Disposition: A | Discharge status: Home / Self Care | Payer: Medicare Other | Source: Ambulatory Visit | Attending: Orthopaedic Surgery | Admitting: Orthopaedic Surgery

## 2022-08-17 DIAGNOSIS — M47894 Other spondylosis, thoracic region: Secondary | ICD-10-CM

## 2022-08-17 DIAGNOSIS — M40204 Unspecified kyphosis, thoracic region: Secondary | ICD-10-CM | POA: Diagnosis not present

## 2022-08-17 DIAGNOSIS — M549 Dorsalgia, unspecified: Secondary | ICD-10-CM | POA: Diagnosis not present

## 2022-08-18 DIAGNOSIS — M48062 Spinal stenosis, lumbar region with neurogenic claudication: Secondary | ICD-10-CM | POA: Diagnosis not present

## 2022-08-18 DIAGNOSIS — R229 Localized swelling, mass and lump, unspecified: Secondary | ICD-10-CM | POA: Diagnosis not present

## 2022-08-20 DIAGNOSIS — M4326 Fusion of spine, lumbar region: Secondary | ICD-10-CM | POA: Diagnosis not present

## 2022-08-20 DIAGNOSIS — M961 Postlaminectomy syndrome, not elsewhere classified: Secondary | ICD-10-CM | POA: Diagnosis not present

## 2022-08-20 DIAGNOSIS — G894 Chronic pain syndrome: Secondary | ICD-10-CM | POA: Diagnosis not present

## 2022-08-27 DIAGNOSIS — R7301 Impaired fasting glucose: Secondary | ICD-10-CM | POA: Diagnosis not present

## 2022-08-27 DIAGNOSIS — D509 Iron deficiency anemia, unspecified: Secondary | ICD-10-CM | POA: Diagnosis not present

## 2022-09-03 ENCOUNTER — Other Ambulatory Visit: Payer: Self-pay | Admitting: Internal Medicine

## 2022-09-03 DIAGNOSIS — I10 Essential (primary) hypertension: Secondary | ICD-10-CM | POA: Diagnosis not present

## 2022-09-03 DIAGNOSIS — Z96651 Presence of right artificial knee joint: Secondary | ICD-10-CM | POA: Diagnosis not present

## 2022-09-03 DIAGNOSIS — N1831 Chronic kidney disease, stage 3a: Secondary | ICD-10-CM | POA: Diagnosis not present

## 2022-09-03 DIAGNOSIS — Z96652 Presence of left artificial knee joint: Secondary | ICD-10-CM | POA: Diagnosis not present

## 2022-09-03 DIAGNOSIS — K219 Gastro-esophageal reflux disease without esophagitis: Secondary | ICD-10-CM | POA: Diagnosis not present

## 2022-09-03 DIAGNOSIS — D509 Iron deficiency anemia, unspecified: Secondary | ICD-10-CM | POA: Diagnosis not present

## 2022-09-03 DIAGNOSIS — G4700 Insomnia, unspecified: Secondary | ICD-10-CM | POA: Diagnosis not present

## 2022-09-03 DIAGNOSIS — E782 Mixed hyperlipidemia: Secondary | ICD-10-CM | POA: Diagnosis not present

## 2022-09-03 DIAGNOSIS — M48062 Spinal stenosis, lumbar region with neurogenic claudication: Secondary | ICD-10-CM | POA: Diagnosis not present

## 2022-09-03 DIAGNOSIS — R011 Cardiac murmur, unspecified: Secondary | ICD-10-CM | POA: Diagnosis not present

## 2022-09-03 DIAGNOSIS — Z13820 Encounter for screening for osteoporosis: Secondary | ICD-10-CM

## 2022-09-03 DIAGNOSIS — R7301 Impaired fasting glucose: Secondary | ICD-10-CM | POA: Diagnosis not present

## 2022-09-14 ENCOUNTER — Other Ambulatory Visit: Payer: Self-pay | Admitting: Internal Medicine

## 2022-09-14 DIAGNOSIS — Z1231 Encounter for screening mammogram for malignant neoplasm of breast: Secondary | ICD-10-CM

## 2022-11-04 DIAGNOSIS — G894 Chronic pain syndrome: Secondary | ICD-10-CM | POA: Diagnosis not present

## 2022-11-10 DIAGNOSIS — H43393 Other vitreous opacities, bilateral: Secondary | ICD-10-CM | POA: Diagnosis not present

## 2022-12-03 DIAGNOSIS — M961 Postlaminectomy syndrome, not elsewhere classified: Secondary | ICD-10-CM | POA: Diagnosis not present

## 2022-12-03 DIAGNOSIS — Z981 Arthrodesis status: Secondary | ICD-10-CM | POA: Diagnosis not present

## 2022-12-03 DIAGNOSIS — G894 Chronic pain syndrome: Secondary | ICD-10-CM | POA: Diagnosis not present

## 2022-12-03 DIAGNOSIS — M5416 Radiculopathy, lumbar region: Secondary | ICD-10-CM | POA: Diagnosis not present

## 2022-12-11 DIAGNOSIS — M5416 Radiculopathy, lumbar region: Secondary | ICD-10-CM | POA: Diagnosis not present

## 2022-12-14 DIAGNOSIS — Z01810 Encounter for preprocedural cardiovascular examination: Secondary | ICD-10-CM | POA: Diagnosis not present

## 2022-12-14 DIAGNOSIS — Z8673 Personal history of transient ischemic attack (TIA), and cerebral infarction without residual deficits: Secondary | ICD-10-CM | POA: Diagnosis not present

## 2022-12-14 DIAGNOSIS — Z01812 Encounter for preprocedural laboratory examination: Secondary | ICD-10-CM | POA: Diagnosis not present

## 2022-12-14 DIAGNOSIS — M961 Postlaminectomy syndrome, not elsewhere classified: Secondary | ICD-10-CM | POA: Diagnosis not present

## 2022-12-17 DIAGNOSIS — H02412 Mechanical ptosis of left eyelid: Secondary | ICD-10-CM | POA: Diagnosis not present

## 2022-12-17 DIAGNOSIS — H02831 Dermatochalasis of right upper eyelid: Secondary | ICD-10-CM | POA: Diagnosis not present

## 2022-12-17 DIAGNOSIS — H02834 Dermatochalasis of left upper eyelid: Secondary | ICD-10-CM | POA: Diagnosis not present

## 2022-12-17 DIAGNOSIS — H02413 Mechanical ptosis of bilateral eyelids: Secondary | ICD-10-CM | POA: Diagnosis not present

## 2022-12-17 DIAGNOSIS — H0279 Other degenerative disorders of eyelid and periocular area: Secondary | ICD-10-CM | POA: Diagnosis not present

## 2022-12-17 DIAGNOSIS — H02411 Mechanical ptosis of right eyelid: Secondary | ICD-10-CM | POA: Diagnosis not present

## 2022-12-17 DIAGNOSIS — H57813 Brow ptosis, bilateral: Secondary | ICD-10-CM | POA: Diagnosis not present

## 2022-12-17 DIAGNOSIS — H53483 Generalized contraction of visual field, bilateral: Secondary | ICD-10-CM | POA: Diagnosis not present

## 2022-12-17 DIAGNOSIS — H02421 Myogenic ptosis of right eyelid: Secondary | ICD-10-CM | POA: Diagnosis not present

## 2022-12-22 DIAGNOSIS — M549 Dorsalgia, unspecified: Secondary | ICD-10-CM | POA: Diagnosis not present

## 2022-12-22 DIAGNOSIS — Z8673 Personal history of transient ischemic attack (TIA), and cerebral infarction without residual deficits: Secondary | ICD-10-CM | POA: Diagnosis not present

## 2022-12-22 DIAGNOSIS — M5416 Radiculopathy, lumbar region: Secondary | ICD-10-CM | POA: Diagnosis not present

## 2022-12-22 DIAGNOSIS — I10 Essential (primary) hypertension: Secondary | ICD-10-CM | POA: Diagnosis not present

## 2022-12-22 DIAGNOSIS — G629 Polyneuropathy, unspecified: Secondary | ICD-10-CM | POA: Diagnosis not present

## 2022-12-22 DIAGNOSIS — Z981 Arthrodesis status: Secondary | ICD-10-CM | POA: Diagnosis not present

## 2022-12-22 DIAGNOSIS — M961 Postlaminectomy syndrome, not elsewhere classified: Secondary | ICD-10-CM | POA: Diagnosis not present

## 2022-12-22 DIAGNOSIS — G894 Chronic pain syndrome: Secondary | ICD-10-CM | POA: Diagnosis not present

## 2022-12-22 DIAGNOSIS — Z4542 Encounter for adjustment and management of neuropacemaker (brain) (peripheral nerve) (spinal cord): Secondary | ICD-10-CM | POA: Diagnosis not present

## 2023-01-11 DIAGNOSIS — H53483 Generalized contraction of visual field, bilateral: Secondary | ICD-10-CM | POA: Diagnosis not present

## 2023-01-12 DIAGNOSIS — H53483 Generalized contraction of visual field, bilateral: Secondary | ICD-10-CM | POA: Diagnosis not present

## 2023-01-12 DIAGNOSIS — M5416 Radiculopathy, lumbar region: Secondary | ICD-10-CM | POA: Diagnosis not present

## 2023-01-27 ENCOUNTER — Telehealth: Payer: Self-pay | Admitting: *Deleted

## 2023-01-27 ENCOUNTER — Telehealth: Payer: Self-pay | Admitting: Cardiology

## 2023-01-27 NOTE — Telephone Encounter (Signed)
Left message to call back to schedule a tele pre op appt.  ?

## 2023-01-27 NOTE — Telephone Encounter (Signed)
Patient was returning call. Please advise ?

## 2023-01-27 NOTE — Telephone Encounter (Signed)
   Pre-operative Risk Assessment    Patient Name: [name]  DOB: 1946-04-20 MRN: [id number]      Request for Surgical Clearance    Procedure:   Bilateral lateral direct brow ptosis repair and bilateral upper eyelid blepharoplasty  Date of Surgery:  Clearance 02/09/23                                 Surgeon:  Dr. Sarajane Marek Surgeon's Group or Practice Name:  [facility] Phone number:  [phone] Fax number:  [phone]   Type of Clearance Requested:   - Medical  - Pharmacy:  Hold [facility]      Type of Anesthesia:  Not Indicated   Additional requests/questions:    SignedHipolito Bayley   01/27/2023, 12:59 PM

## 2023-01-27 NOTE — Telephone Encounter (Signed)
Pt has been scheduled for tele pre op appt 02/02/23 @ 9 am. Med rec and consent are done.     Patient Consent for Virtual Visit        [name] has provided verbal consent on 01/27/2023 for a virtual visit (video or telephone).   CONSENT FOR VIRTUAL VISIT FOR:  [name]  By participating in this virtual visit I agree to the following:  I hereby voluntarily request, consent and authorize [place] and its employed or contracted physicians, physician assistants, nurse practitioners or other licensed health care professionals (the Practitioner), to provide me with telemedicine health care services (the "Services") as deemed necessary by the treating Practitioner. I acknowledge and consent to receive the Services by the Practitioner via telemedicine. I understand that the telemedicine visit will involve communicating with the Practitioner through live audiovisual communication technology and the disclosure of certain medical information by electronic transmission. I acknowledge that I have been given the opportunity to request an in-person assessment or other available alternative prior to the telemedicine visit and am voluntarily participating in the telemedicine visit.  I understand that I have the right to withhold or withdraw my consent to the use of telemedicine in the course of my care at any time, without affecting my right to future care or treatment, and that the Practitioner or I may terminate the telemedicine visit at any time. I understand that I have the right to inspect all information obtained and/or recorded in the course of the telemedicine visit and may receive copies of available information for a reasonable fee.  I understand that some of the potential risks of receiving the Services via telemedicine include:  Delay or interruption in medical evaluation due to technological equipment failure or disruption; Information transmitted may not be sufficient (e.g. poor  resolution of images) to allow for appropriate medical decision making by the Practitioner; and/or  In rare instances, security protocols could fail, causing a breach of personal health information.  Furthermore, I acknowledge that it is my responsibility to provide information about my medical history, conditions and care that is complete and accurate to the best of my ability. I acknowledge that Practitioner's advice, recommendations, and/or decision may be based on factors not within their control, such as incomplete or inaccurate data provided by me or distortions of diagnostic images or specimens that may result from electronic transmissions. I understand that the practice of medicine is not an exact science and that Practitioner makes no warranties or guarantees regarding treatment outcomes. I acknowledge that a copy of this consent can be made available to me via my patient portal ([facility]), or I can request a printed copy by calling the office of [facility].    I understand that my insurance will be billed for this visit.   I have read or had this consent read to me. I understand the contents of this consent, which adequately explains the benefits and risks of the Services being provided via telemedicine.  I have been provided ample opportunity to ask questions regarding this consent and the Services and have had my questions answered to my satisfaction. I give my informed consent for the services to be provided through the use of telemedicine in my medical care

## 2023-01-27 NOTE — Telephone Encounter (Signed)
   Name: EVERLI ROTHER  DOB: Feb 18, 1946  MRN: [id number]  Primary Cardiologist: Rozann Lesches, MD   Preoperative team, please contact this patient and set up a phone call appointment for further preoperative risk assessment. Please obtain consent and complete medication review. Thank you for your help.  I confirm that guidance regarding antiplatelet and oral anticoagulation therapy has been completed and, if necessary, noted below  Patient is currently on aspirin for history of stroke.  Guidance for holding should come from prescribing provider.  Mable Fill, Marissa Nestle, NP 01/27/2023, 1:08 PM [place]

## 2023-01-27 NOTE — Telephone Encounter (Signed)
Pt has been scheduled for tele pre op appt 02/02/23 @ 9 am. Med rec and consent are done.

## 2023-02-02 ENCOUNTER — Ambulatory Visit: Payer: Medicare Other | Attending: Cardiovascular Disease | Admitting: Student

## 2023-02-02 ENCOUNTER — Encounter: Payer: Self-pay | Admitting: Student

## 2023-02-02 DIAGNOSIS — Z01810 Encounter for preprocedural cardiovascular examination: Secondary | ICD-10-CM

## 2023-02-02 NOTE — Progress Notes (Signed)
Virtual Visit via Telephone Note   Because of [name]'s co-morbid illnesses, she is at least at moderate risk for complications without adequate follow up.  This format is felt to be most appropriate for this patient at this time.  The patient did not have access to video technology/had technical difficulties with video requiring transitioning to audio format only (telephone).  All issues noted in this document were discussed and addressed.  No physical exam could be performed with this format.  Please refer to the patient's chart for her consent to telehealth for [facility].  Evaluation Performed:  Preoperative cardiovascular risk assessment _____________   Date:  02/02/2023   Patient ID:  [name], DOB 04-13-1946, MRN [id number] Patient Location:  Home Provider location:   Office  Primary Care Provider:  Celene Squibb, MD Primary Cardiologist:  Rozann Lesches, MD  Chief Complaint / Patient Profile   77 y.o. y/o female with a h/o cardiac murmur, hypertension, hyperlipidemia who is pending bilateral lateral direct brow ptosis repair and bilateral upper eyelid blepharoplasty by Dr. Sabino Dick and presents today for telephonic preoperative cardiovascular risk assessment.  History of Present Illness    [name] is a 77 y.o. female who presents via audio/video conferencing for a telehealth visit today.  Pt was last seen in cardiology clinic on 11/26/2021 by Dr. Domenic Polite.  At that time AMELEA KUKUK was doing well.  The patient is now pending procedure as outlined above. Since her last visit, she is doing well from a cardiac standpoint. Patient denies shortness of breath or dyspnea on exertion. No chest pain, pressure, or tightness. Denies lower extremity edema, orthopnea, or PND. No palpitations. She sleeps in a recliner secondary to back pain. Her activity is somewhat limited secondary to chronic back issues. She did have a spinal stimulator implanted last year and  that helps. 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   Past Medical History    Past Medical History:  Diagnosis Date   Arthritis    Cataracts, bilateral    Chronic back pain    Essential hypertension    GERD (gastroesophageal reflux disease)    Impaired fasting glucose    Iron deficiency anemia    Mixed hyperlipidemia    Spinal stenosis    Stroke ([place]) 2019   Wears dentures    Past Surgical History:  Procedure Laterality Date   ABDOMINAL HYSTERECTOMY  11   APPENDECTOMY     BACK SURGERY  2010   BACK SURGERY  2015   BACK SURGERY  06/2016   BIOPSY  03/11/2021   Procedure: BIOPSY;  Surgeon: Eloise Harman, DO;  [facility];  Service: Endoscopy;;   BIOPSY  05/06/2021   Procedure: BIOPSY;  Surgeon: Eloise Harman, DO;  [facility];  Service: Endoscopy;;   CATARACT EXTRACTION W/PHACO Left 01/07/2017   Procedure: CATARACT EXTRACTION PHACO AND INTRAOCULAR LENS PLACEMENT ([place]);  Surgeon: Tonny Branch, MD;  [facility];  Service: Ophthalmology;  Laterality: Left;  left CDE 9.31    CATARACT EXTRACTION W/PHACO Right 01/25/2017   Procedure: CATARACT EXTRACTION PHACO AND INTRAOCULAR LENS PLACEMENT ([place]) CDE:16.07;  Surgeon: Tonny Branch, MD;  [facility];  Service: Ophthalmology;  Laterality: Right;  right   COLONOSCOPY  04/2008   Surgeon: Dr. Gala Romney; anal papilla, internal hemorrhoids, 5 mm polyp and diminutive polyp removed.  Pathology with 1 tubular adenoma and benign colonic mucosa with focal surface erosion.  Recommend repeat colonoscopy in 5 years.   COLONOSCOPY WITH PROPOFOL N/A 03/11/2021   Procedure: COLONOSCOPY WITH PROPOFOL;  Surgeon: Eloise Harman, DO;  [facility];  Service: Endoscopy;  Laterality: N/A;  PM   ESOPHAGOGASTRODUODENOSCOPY  04/2008   Surgeon: Dr. Gala Romney; circumferential distal esophageal erosions consistent with nonerosive reflux esophagitis, small hiatal hernia, antral erosions, area of scar versus  adenomatous change s/p biopsy (reactive gastropathy, negative H. pylori), normal examined duodenum.   ESOPHAGOGASTRODUODENOSCOPY (EGD) WITH PROPOFOL N/A 03/11/2021   Procedure: ESOPHAGOGASTRODUODENOSCOPY (EGD) WITH PROPOFOL;  Surgeon: Eloise Harman, DO;  [facility];  Service: Endoscopy;  Laterality: N/A;   ESOPHAGOGASTRODUODENOSCOPY (EGD) WITH PROPOFOL N/A 05/06/2021   Procedure: ESOPHAGOGASTRODUODENOSCOPY (EGD) WITH PROPOFOL;  Surgeon: Eloise Harman, DO;  [facility];  Service: Endoscopy;  Laterality: N/A;  7:30am   FRACTURE SURGERY Right    arm  1974   LUMBAR WOUND DEBRIDEMENT N/A 09/23/2017   Procedure: Exploration of Lumbar Wound; Repair of Pseudomeningocele;  Surgeon: Newman Pies, MD;  Location: [place];  Service: Neurosurgery;  Laterality: N/A;   POLYPECTOMY  03/11/2021   Procedure: POLYPECTOMY INTESTINAL;  Surgeon: Eloise Harman, DO;  [facility];  Service: Endoscopy;;   TONSILLECTOMY     TOTAL KNEE ARTHROPLASTY Right 05/30/2019   Procedure: RIGHT TOTAL KNEE ARTHROPLASTY;  Surgeon: Mcarthur Rossetti, MD;  Location: [place];  Service: Orthopedics;  Laterality: Right;   TOTAL KNEE ARTHROPLASTY Left 10/17/2019   TOTAL KNEE ARTHROPLASTY Left 10/17/2019   Procedure: LEFT TOTAL KNEE ARTHROPLASTY;  Surgeon: Mcarthur Rossetti, MD;  Location: [place];  Service: Orthopedics;  Laterality: Left;    Allergies  Allergies  Allergen Reactions   Flexeril [Cyclobenzaprine] Nausea Only   Hydrocodone Nausea Only   Dexlansoprazole Rash   Lyrica [Pregabalin] Palpitations   Neurontin [Gabapentin] Palpitations    Home Medications    Prior to Admission medications   Medication Sig Start Date End Date Taking? Authorizing Provider  aspirin EC 81 MG tablet Take 81 mg by mouth daily. Swallow whole.    [provider]  atenolol (TENORMIN) 25 MG tablet Take 25 mg by mouth daily. 05/14/19   [provider]  omeprazole (PRILOSEC) 40 MG  capsule Take 1 capsule (40 mg total) by mouth daily. Patient not taking: Reported on 11/26/2021 05/06/21 11/02/21  Eloise Harman, DO  oxyCODONE-acetaminophen (PERCOCET/ROXICET) 5-325 MG tablet Take 1 tablet by mouth every 6 (six) hours as needed.    [provider]  rosuvastatin (CRESTOR) 5 MG tablet Take 5 mg by mouth daily. 12/26/20   [provider]    Physical Exam    Vital Signs:  DEVANN TROXLER does not have vital signs available for review today.  Given telephonic nature of communication, physical exam is limited. AAOx3. NAD. Normal affect.  Speech and respirations are unlabored.  Accessory Clinical Findings    None  Assessment & Plan    Primary Cardiologist: Rozann Lesches, MD  Preoperative cardiovascular risk assessment. Bilateral lateral direct brow ptosis repair and bilateral upper eyelid blepharoplasty by Dr. Sabino Dick.  Chart reviewed as part of pre-operative protocol coverage. According to the RCRI, patient has a 0.4% risk of MACE. 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   Given past medical history and time since last visit, based on ACC/AHA guidelines, KOOPER CALDARERA would be at acceptable risk for the planned procedure without further cardiovascular testing.   Patient was advised  that if she develops new symptoms prior to surgery to contact our office to arrange a follow-up appointment.  she verbalized understanding.  Ideally aspirin should be continued without interruption, however if the bleeding risk is too great, aspirin may be held for 5-7 days prior to surgery. Please resume aspirin post operatively when it is felt to be safe from a bleeding standpoint.    Time:   Today, I have spent 5 minutes with the patient with telehealth technology discussing medical history, symptoms, and management plan.     Mayra Reel, NP  02/02/2023, 7:53 AM

## 2023-02-04 DIAGNOSIS — M4326 Fusion of spine, lumbar region: Secondary | ICD-10-CM | POA: Diagnosis not present

## 2023-02-09 ENCOUNTER — Other Ambulatory Visit: Payer: Self-pay | Admitting: Physician Assistant

## 2023-02-09 DIAGNOSIS — M5416 Radiculopathy, lumbar region: Secondary | ICD-10-CM

## 2023-02-09 DIAGNOSIS — M4326 Fusion of spine, lumbar region: Secondary | ICD-10-CM

## 2023-02-24 DIAGNOSIS — H0279 Other degenerative disorders of eyelid and periocular area: Secondary | ICD-10-CM | POA: Diagnosis not present

## 2023-02-24 DIAGNOSIS — H02421 Myogenic ptosis of right eyelid: Secondary | ICD-10-CM | POA: Diagnosis not present

## 2023-02-24 DIAGNOSIS — H02413 Mechanical ptosis of bilateral eyelids: Secondary | ICD-10-CM | POA: Diagnosis not present

## 2023-02-24 DIAGNOSIS — H53483 Generalized contraction of visual field, bilateral: Secondary | ICD-10-CM | POA: Diagnosis not present

## 2023-02-24 DIAGNOSIS — H57813 Brow ptosis, bilateral: Secondary | ICD-10-CM | POA: Diagnosis not present

## 2023-02-24 DIAGNOSIS — H02834 Dermatochalasis of left upper eyelid: Secondary | ICD-10-CM | POA: Diagnosis not present

## 2023-02-24 DIAGNOSIS — H02411 Mechanical ptosis of right eyelid: Secondary | ICD-10-CM | POA: Diagnosis not present

## 2023-02-24 DIAGNOSIS — H02831 Dermatochalasis of right upper eyelid: Secondary | ICD-10-CM | POA: Diagnosis not present

## 2023-02-24 DIAGNOSIS — H02412 Mechanical ptosis of left eyelid: Secondary | ICD-10-CM | POA: Diagnosis not present

## 2023-03-02 ENCOUNTER — Ambulatory Visit
Admission: RE | Admit: 2023-03-02 | Discharge: 2023-03-02 | Disposition: A | Discharge status: Home / Self Care | Payer: Medicare Other | Source: Ambulatory Visit | Attending: Internal Medicine | Admitting: Internal Medicine

## 2023-03-02 DIAGNOSIS — Z1231 Encounter for screening mammogram for malignant neoplasm of breast: Secondary | ICD-10-CM | POA: Diagnosis not present

## 2023-03-02 DIAGNOSIS — Z780 Asymptomatic menopausal state: Secondary | ICD-10-CM | POA: Diagnosis not present

## 2023-03-02 DIAGNOSIS — M8589 Other specified disorders of bone density and structure, multiple sites: Secondary | ICD-10-CM | POA: Diagnosis not present

## 2023-03-02 DIAGNOSIS — Z13820 Encounter for screening for osteoporosis: Secondary | ICD-10-CM

## 2023-03-04 DIAGNOSIS — D509 Iron deficiency anemia, unspecified: Secondary | ICD-10-CM | POA: Diagnosis not present

## 2023-03-04 DIAGNOSIS — R7301 Impaired fasting glucose: Secondary | ICD-10-CM | POA: Diagnosis not present

## 2023-03-05 DIAGNOSIS — Z0000 Encounter for general adult medical examination without abnormal findings: Secondary | ICD-10-CM | POA: Diagnosis not present

## 2023-03-08 DIAGNOSIS — E782 Mixed hyperlipidemia: Secondary | ICD-10-CM | POA: Diagnosis not present

## 2023-03-08 DIAGNOSIS — R011 Cardiac murmur, unspecified: Secondary | ICD-10-CM | POA: Diagnosis not present

## 2023-03-08 DIAGNOSIS — G4700 Insomnia, unspecified: Secondary | ICD-10-CM | POA: Diagnosis not present

## 2023-03-08 DIAGNOSIS — I129 Hypertensive chronic kidney disease with stage 1 through stage 4 chronic kidney disease, or unspecified chronic kidney disease: Secondary | ICD-10-CM | POA: Diagnosis not present

## 2023-03-08 DIAGNOSIS — I10 Essential (primary) hypertension: Secondary | ICD-10-CM | POA: Diagnosis not present

## 2023-03-08 DIAGNOSIS — D509 Iron deficiency anemia, unspecified: Secondary | ICD-10-CM | POA: Diagnosis not present

## 2023-03-08 DIAGNOSIS — M48062 Spinal stenosis, lumbar region with neurogenic claudication: Secondary | ICD-10-CM | POA: Diagnosis not present

## 2023-03-08 DIAGNOSIS — M48061 Spinal stenosis, lumbar region without neurogenic claudication: Secondary | ICD-10-CM | POA: Diagnosis not present

## 2023-03-08 DIAGNOSIS — R7303 Prediabetes: Secondary | ICD-10-CM | POA: Diagnosis not present

## 2023-03-08 DIAGNOSIS — N1831 Chronic kidney disease, stage 3a: Secondary | ICD-10-CM | POA: Diagnosis not present

## 2023-03-08 DIAGNOSIS — K219 Gastro-esophageal reflux disease without esophagitis: Secondary | ICD-10-CM | POA: Diagnosis not present

## 2023-03-10 DIAGNOSIS — G894 Chronic pain syndrome: Secondary | ICD-10-CM | POA: Diagnosis not present

## 2023-08-25 ENCOUNTER — Other Ambulatory Visit (INDEPENDENT_AMBULATORY_CARE_PROVIDER_SITE_OTHER): Payer: Medicare Other

## 2023-08-25 ENCOUNTER — Encounter: Payer: Self-pay | Admitting: Orthopaedic Surgery

## 2023-08-25 ENCOUNTER — Ambulatory Visit: Payer: Medicare Other | Admitting: Orthopaedic Surgery

## 2023-08-25 DIAGNOSIS — M25572 Pain in left ankle and joints of left foot: Secondary | ICD-10-CM | POA: Diagnosis not present

## 2023-08-25 DIAGNOSIS — M79644 Pain in right finger(s): Secondary | ICD-10-CM | POA: Diagnosis not present

## 2023-08-25 DIAGNOSIS — M65311 Trigger thumb, right thumb: Secondary | ICD-10-CM | POA: Diagnosis not present

## 2023-08-25 MED ORDER — METHYLPREDNISOLONE 4 MG PO TABS: ORAL_TABLET | ORAL | 0 refills | Status: DC

## 2023-08-25 MED ORDER — LIDOCAINE HCL 1 % IJ SOLN
1.0000 mL | INTRAMUSCULAR | Status: AC | PRN
  Administered 2023-08-25: 1 mL

## 2023-08-25 MED ORDER — METHYLPREDNISOLONE ACETATE 40 MG/ML IJ SUSP
40.0000 mg | INTRAMUSCULAR | Status: AC | PRN
  Administered 2023-08-25: 40 mg

## 2023-08-25 NOTE — Progress Notes (Signed)
The patient mainly comes to see me today for right trigger thumb and is requesting injection in her thumb.  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  She is also been dealing with left foot and ankle pain and swelling.  She has seen the tried foot specialist in the past as well as Dr. Lajoyce Corners.  She has had a corn pared down from the plantar aspect of her foot from time to time.  She is not a diabetic.  She denies any injury to her foot.  On exam both her feet are wide.  Her left foot has no gross deformities other than a bunion deformity.  There are some painful areas of the midfoot and the plantar aspect of her foot to palpation as well as painful areas of the ankle but there is no instability on my exam.  Her Achilles is intact and her range of motion is good.  Her foot is well-perfused with normal sensation.  3 views of her left foot show no gross findings or acute findings.  Examination of her right thumb shows pain over the A1 pulley and active triggering.  The remainder of her hand exam is normal.  I did place a steroid injection over the A1 pulley of her right thumb.  If this worsens we can always perform an A1 pulley release but I would also recommend a second injection in 6 to 8 weeks if she still is having triggering.  She would like to follow-up with Dr. Lajoyce Corners for her left foot so we can see if we can get that appointment set up with him soon.    Procedure Note  Patient: [name]             Date of Birth: 1946-02-12           MRN: [id number]             Visit Date: 08/25/2023  Procedures: Visit Diagnoses:  1. Pain in left ankle and joints of left foot   2. Pain of right thumb   3. Trigger thumb, right thumb     Hand/UE Inj: R thumb A1 for trigger finger on 08/25/2023 1:19 PM Medications: 1 mL lidocaine 1 %; 40 mg methylPREDNISolone acetate 40 MG/ML

## 2023-08-31 DIAGNOSIS — Z136 Encounter for screening for cardiovascular disorders: Secondary | ICD-10-CM | POA: Diagnosis not present

## 2023-08-31 DIAGNOSIS — R7303 Prediabetes: Secondary | ICD-10-CM | POA: Diagnosis not present

## 2023-08-31 DIAGNOSIS — D509 Iron deficiency anemia, unspecified: Secondary | ICD-10-CM | POA: Diagnosis not present

## 2023-09-06 DIAGNOSIS — G4700 Insomnia, unspecified: Secondary | ICD-10-CM | POA: Diagnosis not present

## 2023-09-06 DIAGNOSIS — D509 Iron deficiency anemia, unspecified: Secondary | ICD-10-CM | POA: Diagnosis not present

## 2023-09-06 DIAGNOSIS — E782 Mixed hyperlipidemia: Secondary | ICD-10-CM | POA: Diagnosis not present

## 2023-09-06 DIAGNOSIS — I10 Essential (primary) hypertension: Secondary | ICD-10-CM | POA: Diagnosis not present

## 2023-09-06 DIAGNOSIS — I129 Hypertensive chronic kidney disease with stage 1 through stage 4 chronic kidney disease, or unspecified chronic kidney disease: Secondary | ICD-10-CM | POA: Diagnosis not present

## 2023-09-06 DIAGNOSIS — M8580 Other specified disorders of bone density and structure, unspecified site: Secondary | ICD-10-CM | POA: Diagnosis not present

## 2023-09-06 DIAGNOSIS — R7303 Prediabetes: Secondary | ICD-10-CM | POA: Diagnosis not present

## 2023-09-06 DIAGNOSIS — R229 Localized swelling, mass and lump, unspecified: Secondary | ICD-10-CM | POA: Diagnosis not present

## 2023-09-06 DIAGNOSIS — K219 Gastro-esophageal reflux disease without esophagitis: Secondary | ICD-10-CM | POA: Diagnosis not present

## 2023-09-06 DIAGNOSIS — R011 Cardiac murmur, unspecified: Secondary | ICD-10-CM | POA: Diagnosis not present

## 2023-09-06 DIAGNOSIS — N1831 Chronic kidney disease, stage 3a: Secondary | ICD-10-CM | POA: Diagnosis not present

## 2023-09-06 DIAGNOSIS — M48062 Spinal stenosis, lumbar region with neurogenic claudication: Secondary | ICD-10-CM | POA: Diagnosis not present

## 2023-09-09 ENCOUNTER — Other Ambulatory Visit (INDEPENDENT_AMBULATORY_CARE_PROVIDER_SITE_OTHER): Payer: Medicare Other

## 2023-09-09 ENCOUNTER — Ambulatory Visit: Payer: Medicare Other | Admitting: Orthopedic Surgery

## 2023-09-09 DIAGNOSIS — I872 Venous insufficiency (chronic) (peripheral): Secondary | ICD-10-CM | POA: Diagnosis not present

## 2023-09-09 DIAGNOSIS — M25572 Pain in left ankle and joints of left foot: Secondary | ICD-10-CM | POA: Diagnosis not present

## 2023-09-14 ENCOUNTER — Encounter: Payer: Self-pay | Admitting: Orthopedic Surgery

## 2023-09-14 DIAGNOSIS — M25572 Pain in left ankle and joints of left foot: Secondary | ICD-10-CM

## 2023-09-14 MED ORDER — LIDOCAINE HCL 1 % IJ SOLN
2.0000 mL | INTRAMUSCULAR | Status: AC | PRN
  Administered 2023-09-14: 2 mL

## 2023-09-14 MED ORDER — METHYLPREDNISOLONE ACETATE 40 MG/ML IJ SUSP
40.0000 mg | INTRAMUSCULAR | Status: AC | PRN
  Administered 2023-09-14: 40 mg via INTRA_ARTICULAR

## 2023-09-14 NOTE — Progress Notes (Signed)
Office Visit Note   Patient: [name]           Date of Birth: December 21, 1945           MRN: [id number] Visit Date: 09/09/2023              Requested by: Benita Stabile, MD [address] [place],  [place] [address] PCP: Benita Stabile, MD  Chief Complaint  Patient presents with   Left Foot - Pain      HPI: Patient is a 77 year old woman who presents with left ankle pain she has pain with ambulation.  She states her ankle feels like it will give way.  She has not fallen.  She states the pain is anteriorly to the ankle.  Patient also complains of a plantar corn on the third metatarsal head.  Assessment & Plan: Visit Diagnoses:  1. Pain in left ankle and joints of left foot     Plan: The ankle was injected the corn was pared.  Recommended compression for the venous insufficiency.  Follow-Up Instructions: Return if symptoms worsen or fail to improve.   Ortho Exam  Patient is alert, oriented, no adenopathy, well-dressed, normal affect, normal respiratory effort. Examination patient does have venous stasis swelling with varicose veins.  Examination of the ankle on the left there is no redness there is tenderness to palpation anteriorly over the ankle.  She has a hyperkeratotic lesion beneath the third metatarsal head.  After informed consent this hyperkeratotic lesion was pared.  Left ankle was injected.  Imaging: No results found. No images are attached to the encounter.  Labs: Lab Results  Component Value Date   HGBA1C 6.1 (H) 08/05/2018   ESRSEDRATE 38 (H) 08/05/2018   CRP <0.8 08/04/2018   REPTSTATUS 09/28/2017 FINAL 09/23/2017   GRAMSTAIN  09/23/2017    RARE WBC PRESENT, PREDOMINANTLY PMN NO ORGANISMS SEEN    CULT No growth aerobically or anaerobically. 09/23/2017     Lab Results  Component Value Date   ALBUMIN 4.1 08/04/2018   ALBUMIN 3.4 (L) 09/20/2017   ALBUMIN 4.3 01/26/2011    No results found for: "MG" No results found for: "VD25OH"  No results  found for: "PREALBUMIN"    Latest Ref Rng & Units 03/07/2021   11:17 AM 10/18/2019    5:15 AM 10/09/2019    9:27 AM  CBC EXTENDED  WBC 4.0 - 10.5 K/uL 5.2  6.6  5.2   RBC 3.87 - 5.11 MIL/uL 3.96  3.39  4.13   Hemoglobin 12.0 - 15.0 g/dL 19.1  9.0  47.8   HCT 29.5 - 46.0 % 34.5  28.6  36.0   Platelets 150 - 400 K/uL 205  174  212   NEUT# 1.7 - 7.7 K/uL 2.8     Lymph# 0.7 - 4.0 K/uL 1.7        There is no height or weight on file to calculate BMI.  Orders:  Orders Placed This Encounter  Procedures   XR Ankle 2 Views Left   No orders of the defined types were placed in this encounter.    Procedures: Medium Joint Inj: L ankle on 09/14/2023 4:58 PM Indications: pain and diagnostic evaluation Details: 22 G 1.5 in needle, anteromedial approach Medications: 2 mL lidocaine 1 %; 40 mg methylPREDNISolone acetate 40 MG/ML Outcome: tolerated well, no immediate complications Procedure, treatment alternatives, risks and benefits explained, specific risks discussed. Consent was given by the patient. Immediately prior to procedure a time  out was called to verify the correct patient, procedure, equipment, support staff and site/side marked as required. Patient was prepped and draped in the usual sterile fashion.      Clinical Data: No additional findings.  ROS:  All other systems negative, except as noted in the HPI. Review of Systems  Objective: Vital Signs: LMP  (LMP Unknown)   Specialty Comments:  No specialty comments available.  PMFS History: Patient Active Problem List   Diagnosis Date Noted   H/O adenomatous polyp of colon 02/24/2021   Melena 02/24/2021   Status post total left knee replacement 10/17/2019   Status post total right knee replacement 05/30/2019   Unilateral primary osteoarthritis, left knee 04/18/2019   Unilateral primary osteoarthritis, right knee 04/18/2019   CVA (cerebral vascular accident) ([facility]CC) 08/04/2018   Stroke ([facility]) 08/04/2018   Chronic back  pain 08/04/2018   Post-operative pain 09/20/2017   Postprocedural pseudomeningocele 09/20/2017   Lumbar stenosis with neurogenic claudication 02/04/2015   Stiffness of joints, not elsewhere classified, multiple sites 10/27/2012   Weakness of both legs 10/27/2012   Difficulty walking 10/27/2012   Past Medical History:  Diagnosis Date   Arthritis    Cataracts, bilateral    Chronic back pain    Essential hypertension    GERD (gastroesophageal reflux disease)    Impaired fasting glucose    Iron deficiency anemia    Mixed hyperlipidemia    Spinal stenosis    Stroke ([facility]) 2019   Wears dentures     Family History  Problem Relation Age of Onset   Hypertension Mother    Stomach cancer Mother 20   Stomach cancer Maternal Aunt        late 28s   Breast cancer Neg Hx    Stroke Neg Hx    Colon cancer Neg Hx     Past Surgical History:  Procedure Laterality Date   ABDOMINAL HYSTERECTOMY  70   APPENDECTOMY     BACK SURGERY  2010   BACK SURGERY  2015   BACK SURGERY  06/2016   BIOPSY  03/11/2021   Procedure: BIOPSY;  Surgeon: Lanelle Bal, DO;  [facility];  Service: Endoscopy;;   BIOPSY  05/06/2021   Procedure: BIOPSY;  Surgeon: Lanelle Bal, DO;  [facility];  Service: Endoscopy;;   CATARACT EXTRACTION W/PHACO Left 01/07/2017   Procedure: CATARACT EXTRACTION PHACO AND INTRAOCULAR LENS PLACEMENT ([facility]);  Surgeon: Gemma Payor, MD;  [facility];  Service: Ophthalmology;  Laterality: Left;  left CDE 9.31    CATARACT EXTRACTION W/PHACO Right 01/25/2017   Procedure: CATARACT EXTRACTION PHACO AND INTRAOCULAR LENS PLACEMENT ([facility]) CDE:16.07;  Surgeon: Gemma Payor, MD;  [facility];  Service: Ophthalmology;  Laterality: Right;  right   COLONOSCOPY  04/2008   Surgeon: Dr. Jena Gauss; anal papilla, internal hemorrhoids, 5 mm polyp and diminutive polyp removed.  Pathology with 1 tubular adenoma and benign colonic mucosa with focal surface erosion.  Recommend repeat  colonoscopy in 5 years.   COLONOSCOPY WITH PROPOFOL N/A 03/11/2021   Procedure: COLONOSCOPY WITH PROPOFOL;  Surgeon: Lanelle Bal, DO;  [facility];  Service: Endoscopy;  Laterality: N/A;  PM   ESOPHAGOGASTRODUODENOSCOPY  04/2008   Surgeon: Dr. Jena Gauss; circumferential distal esophageal erosions consistent with nonerosive reflux esophagitis, small hiatal hernia, antral erosions, area of scar versus adenomatous change s/p biopsy (reactive gastropathy, negative H. pylori), normal examined duodenum.   ESOPHAGOGASTRODUODENOSCOPY (EGD) WITH PROPOFOL N/A 03/11/2021   Procedure: ESOPHAGOGASTRODUODENOSCOPY (EGD)  WITH PROPOFOL;  Surgeon: Lanelle Bal, DO;  [facility];  Service: Endoscopy;  Laterality: N/A;   ESOPHAGOGASTRODUODENOSCOPY (EGD) WITH PROPOFOL N/A 05/06/2021   Procedure: ESOPHAGOGASTRODUODENOSCOPY (EGD) WITH PROPOFOL;  Surgeon: Lanelle Bal, DO;  [facility];  Service: Endoscopy;  Laterality: N/A;  7:30am   FRACTURE SURGERY Right    arm  1974   LUMBAR WOUND DEBRIDEMENT N/A 09/23/2017   Procedure: Exploration of Lumbar Wound; Repair of Pseudomeningocele;  Surgeon: Tressie Stalker, MD;  Location: [facility];  Service: Neurosurgery;  Laterality: N/A;   POLYPECTOMY  03/11/2021   Procedure: POLYPECTOMY INTESTINAL;  Surgeon: Lanelle Bal, DO;  [facility];  Service: Endoscopy;;   TONSILLECTOMY     TOTAL KNEE ARTHROPLASTY Right 05/30/2019   Procedure: RIGHT TOTAL KNEE ARTHROPLASTY;  Surgeon: Kathryne Hitch, MD;  [facility];  Service: Orthopedics;  Laterality: Right;   TOTAL KNEE ARTHROPLASTY Left 10/17/2019   TOTAL KNEE ARTHROPLASTY Left 10/17/2019   Procedure: LEFT TOTAL KNEE ARTHROPLASTY;  Surgeon: Kathryne Hitch, MD;  Location: [facility];  Service: Orthopedics;  Laterality: Left;   Social History   Occupational History   Not on file  Tobacco Use   Smoking status: Never   Smokeless tobacco: Never  Vaping Use    Vaping status: Never Used  Substance and Sexual Activity   Alcohol use: No   Drug use: No   Sexual activity: Not Currently    Birth control/protection: Post-menopausal

## 2023-10-05 DIAGNOSIS — I10 Essential (primary) hypertension: Secondary | ICD-10-CM | POA: Diagnosis not present

## 2023-10-05 DIAGNOSIS — Z713 Dietary counseling and surveillance: Secondary | ICD-10-CM | POA: Diagnosis not present

## 2023-10-05 DIAGNOSIS — M25572 Pain in left ankle and joints of left foot: Secondary | ICD-10-CM | POA: Diagnosis not present

## 2023-10-05 DIAGNOSIS — Z79899 Other long term (current) drug therapy: Secondary | ICD-10-CM | POA: Diagnosis not present

## 2023-10-05 DIAGNOSIS — Z2820 Immunization not carried out because of patient decision for unspecified reason: Secondary | ICD-10-CM | POA: Diagnosis not present

## 2023-10-05 DIAGNOSIS — M48062 Spinal stenosis, lumbar region with neurogenic claudication: Secondary | ICD-10-CM | POA: Diagnosis not present

## 2023-11-15 DIAGNOSIS — H04123 Dry eye syndrome of bilateral lacrimal glands: Secondary | ICD-10-CM | POA: Diagnosis not present

## 2023-12-02 DIAGNOSIS — M48062 Spinal stenosis, lumbar region with neurogenic claudication: Secondary | ICD-10-CM | POA: Diagnosis not present

## 2023-12-02 DIAGNOSIS — M25572 Pain in left ankle and joints of left foot: Secondary | ICD-10-CM | POA: Diagnosis not present

## 2023-12-02 DIAGNOSIS — R059 Cough, unspecified: Secondary | ICD-10-CM | POA: Diagnosis not present

## 2023-12-02 DIAGNOSIS — I10 Essential (primary) hypertension: Secondary | ICD-10-CM | POA: Diagnosis not present

## 2023-12-02 DIAGNOSIS — Z713 Dietary counseling and surveillance: Secondary | ICD-10-CM | POA: Diagnosis not present

## 2023-12-02 DIAGNOSIS — Z79899 Other long term (current) drug therapy: Secondary | ICD-10-CM | POA: Diagnosis not present

## 2024-02-22 ENCOUNTER — Other Ambulatory Visit (HOSPITAL_COMMUNITY): Payer: Self-pay | Admitting: Family Medicine

## 2024-02-22 DIAGNOSIS — M48061 Spinal stenosis, lumbar region without neurogenic claudication: Secondary | ICD-10-CM | POA: Diagnosis not present

## 2024-02-22 DIAGNOSIS — R221 Localized swelling, mass and lump, neck: Secondary | ICD-10-CM

## 2024-02-22 DIAGNOSIS — J302 Other seasonal allergic rhinitis: Secondary | ICD-10-CM | POA: Diagnosis not present

## 2024-02-28 ENCOUNTER — Ambulatory Visit (HOSPITAL_COMMUNITY)
Admission: RE | Admit: 2024-02-28 | Discharge: 2024-02-28 | Disposition: A | Discharge status: Home / Self Care | Source: Ambulatory Visit | Attending: Family Medicine | Admitting: Family Medicine

## 2024-02-28 DIAGNOSIS — R221 Localized swelling, mass and lump, neck: Secondary | ICD-10-CM | POA: Insufficient documentation

## 2024-02-29 DIAGNOSIS — R7303 Prediabetes: Secondary | ICD-10-CM | POA: Diagnosis not present

## 2024-02-29 DIAGNOSIS — E559 Vitamin D deficiency, unspecified: Secondary | ICD-10-CM | POA: Diagnosis not present

## 2024-02-29 DIAGNOSIS — M8580 Other specified disorders of bone density and structure, unspecified site: Secondary | ICD-10-CM | POA: Diagnosis not present

## 2024-02-29 DIAGNOSIS — Z136 Encounter for screening for cardiovascular disorders: Secondary | ICD-10-CM | POA: Diagnosis not present

## 2024-02-29 DIAGNOSIS — D509 Iron deficiency anemia, unspecified: Secondary | ICD-10-CM | POA: Diagnosis not present

## 2024-03-06 DIAGNOSIS — R221 Localized swelling, mass and lump, neck: Secondary | ICD-10-CM | POA: Diagnosis not present

## 2024-03-06 DIAGNOSIS — I10 Essential (primary) hypertension: Secondary | ICD-10-CM | POA: Diagnosis not present

## 2024-03-06 DIAGNOSIS — Z0001 Encounter for general adult medical examination with abnormal findings: Secondary | ICD-10-CM | POA: Diagnosis not present

## 2024-03-06 DIAGNOSIS — Z96651 Presence of right artificial knee joint: Secondary | ICD-10-CM | POA: Diagnosis not present

## 2024-03-06 DIAGNOSIS — R7303 Prediabetes: Secondary | ICD-10-CM | POA: Diagnosis not present

## 2024-03-06 DIAGNOSIS — I129 Hypertensive chronic kidney disease with stage 1 through stage 4 chronic kidney disease, or unspecified chronic kidney disease: Secondary | ICD-10-CM | POA: Diagnosis not present

## 2024-03-06 DIAGNOSIS — E782 Mixed hyperlipidemia: Secondary | ICD-10-CM | POA: Diagnosis not present

## 2024-03-06 DIAGNOSIS — K2100 Gastro-esophageal reflux disease with esophagitis, without bleeding: Secondary | ICD-10-CM | POA: Diagnosis not present

## 2024-03-06 DIAGNOSIS — D509 Iron deficiency anemia, unspecified: Secondary | ICD-10-CM | POA: Diagnosis not present

## 2024-03-06 DIAGNOSIS — Z96652 Presence of left artificial knee joint: Secondary | ICD-10-CM | POA: Diagnosis not present

## 2024-03-06 DIAGNOSIS — M25572 Pain in left ankle and joints of left foot: Secondary | ICD-10-CM | POA: Diagnosis not present

## 2024-03-06 DIAGNOSIS — M48061 Spinal stenosis, lumbar region without neurogenic claudication: Secondary | ICD-10-CM | POA: Diagnosis not present

## 2024-03-09 ENCOUNTER — Encounter: Payer: Self-pay | Admitting: *Deleted

## 2024-03-16 ENCOUNTER — Encounter: Payer: Self-pay | Admitting: Internal Medicine

## 2024-03-16 ENCOUNTER — Ambulatory Visit: Admitting: Internal Medicine

## 2024-03-16 ENCOUNTER — Telehealth: Payer: Self-pay

## 2024-03-16 VITALS — BP 133/79 | HR 82 | Temp 97.8°F | Ht 64.0 in | Wt 227.8 lb

## 2024-03-16 DIAGNOSIS — Z860101 Personal history of adenomatous and serrated colon polyps: Secondary | ICD-10-CM

## 2024-03-16 DIAGNOSIS — K5909 Other constipation: Secondary | ICD-10-CM | POA: Diagnosis not present

## 2024-03-16 DIAGNOSIS — K219 Gastro-esophageal reflux disease without esophagitis: Secondary | ICD-10-CM | POA: Diagnosis not present

## 2024-03-16 DIAGNOSIS — Z860100 Personal history of colon polyps, unspecified: Secondary | ICD-10-CM

## 2024-03-16 DIAGNOSIS — K5903 Drug induced constipation: Secondary | ICD-10-CM

## 2024-03-16 DIAGNOSIS — R1319 Other dysphagia: Secondary | ICD-10-CM

## 2024-03-16 MED ORDER — PANTOPRAZOLE SODIUM 40 MG PO TBEC: 40.0000 mg | DELAYED_RELEASE_TABLET | Freq: Every day | ORAL | 11 refills | Status: DC

## 2024-03-16 NOTE — Progress Notes (Signed)
 Primary Care Physician:  Omie Bickers, MD Primary Gastroenterologist:  Dr. Mordechai April  Chief Complaint  Patient presents with   New Patient (Initial Visit)    Pt referred for GERD and sore throat for weeks    HPI:   [name] is a 78 y.o. female who presents to clinic today by referral from her PCP Dr. Del Favia for evaluation.  Last seen in our office 02/24/2021.  Chronic GERD: Reports "sore throat" x 3-4 weeks.  Previously well-controlled on pantoprazole  though has not been taking a PPI in years.  Was prescribed omeprazole  and Carafate, she states this made her stomach sick.  Her symptoms are gradually improving however.  Her throat is not quite as sore anymore.  She also had some mild dysphagia symptoms when her symptoms started though this is also improved.  EGD 05/06/2021 showed esophagitis had healed, gastritis, normal duodenum.  GE junction biopsies with mild inflammation.  No Barrett's esophagus.  EGD 03/11/2021 with LA grade D esophagitis, gastritis, normal duodenum.  Gastric biopsies negative for H. pylori.  Placed on PPI twice daily.  EGD June 2009 for right flank pain with circumferential distal esophageal erosions consistent with mild erosive reflux esophagitis, small hiatal hernia, antral erosions, areas of scar versus adenomatous changes s/p biopsy (reactive gastropathy, negative H. pylori), normal examined duodenum.   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  Taking Dulcolax daily which is keeping her symptoms under good control.  No abdominal pain or bloating.  History of adenomatous colon polyps: Colonoscopy 03/11/2021 with internal hemorrhoids, sigmoid diverticulosis, 6 polyps removed, all very small 1 to 2 mm and one 4 mm polyp.  Polyps were a mix of tubular adenomas and benign polyps.  Recommended 5-year recall.   Past Medical History:  Diagnosis Date   Arthritis    Cataracts, bilateral    Chronic back pain    Essential hypertension    GERD  (gastroesophageal reflux disease)    Impaired fasting glucose    Iron deficiency anemia    Mixed hyperlipidemia    Spinal stenosis    Stroke (HCC) 2019   Wears dentures     Past Surgical History:  Procedure Laterality Date   ABDOMINAL HYSTERECTOMY  82   APPENDECTOMY     BACK SURGERY  2010   BACK SURGERY  2015   BACK SURGERY  06/2016   BIOPSY  03/11/2021   Procedure: BIOPSY;  Surgeon: Vinetta Greening, DO;  [facility];  Service: Endoscopy;;   BIOPSY  05/06/2021   Procedure: BIOPSY;  Surgeon: Vinetta Greening, DO;  [facility];  Service: Endoscopy;;   CATARACT EXTRACTION W/PHACO Left 01/07/2017   Procedure: CATARACT EXTRACTION PHACO AND INTRAOCULAR LENS PLACEMENT (IOC);  Surgeon: Anner Kill, MD;  [facility]: [facility];  Service: Ophthalmology;  Laterality: Left;  left CDE 9.31    CATARACT EXTRACTION W/PHACO Right 01/25/2017   Procedure: CATARACT EXTRACTION PHACO AND INTRAOCULAR LENS PLACEMENT (IOC) CDE:16.07;  Surgeon: Anner Kill, MD;  Location: [facility];  Service: Ophthalmology;  Laterality: Right;  right   COLONOSCOPY  04/2008   Surgeon: Dr. Riley Cheadle; anal papilla, internal hemorrhoids, 5 mm polyp and diminutive polyp removed.  Pathology with 1 tubular adenoma and benign colonic mucosa with focal surface erosion.  Recommend repeat colonoscopy in 5 years.   COLONOSCOPY WITH PROPOFOL  N/A 03/11/2021   Procedure: COLONOSCOPY WITH PROPOFOL ;  Surgeon: Vinetta Greening, DO;  [facility];  Service: Endoscopy;  Laterality: N/A;  PM  ESOPHAGOGASTRODUODENOSCOPY  04/2008   Surgeon: Dr. Riley Cheadle; circumferential distal esophageal erosions consistent with nonerosive reflux esophagitis, small hiatal hernia, antral erosions, area of scar versus adenomatous change s/p biopsy (reactive gastropathy, negative H. pylori), normal examined duodenum.   ESOPHAGOGASTRODUODENOSCOPY (EGD) WITH PROPOFOL  N/A 03/11/2021   Procedure: ESOPHAGOGASTRODUODENOSCOPY (EGD) WITH PROPOFOL ;  Surgeon:  Vinetta Greening, DO;  [facility];  Service: Endoscopy;  Laterality: N/A;   ESOPHAGOGASTRODUODENOSCOPY (EGD) WITH PROPOFOL  N/A 05/06/2021   Procedure: ESOPHAGOGASTRODUODENOSCOPY (EGD) WITH PROPOFOL ;  Surgeon: Vinetta Greening, DO;  [facility];  Service: Endoscopy;  Laterality: N/A;  7:30am   FRACTURE SURGERY Right    arm  1974   LUMBAR WOUND DEBRIDEMENT N/A 09/23/2017   Procedure: Exploration of Lumbar Wound; Repair of Pseudomeningocele;  Surgeon: Garry Kansas, MD;  [facility];  Service: Neurosurgery;  Laterality: N/A;   POLYPECTOMY  03/11/2021   Procedure: POLYPECTOMY INTESTINAL;  Surgeon: Vinetta Greening, DO;  [facility];  Service: Endoscopy;;   TONSILLECTOMY     TOTAL KNEE ARTHROPLASTY Right 05/30/2019   Procedure: RIGHT TOTAL KNEE ARTHROPLASTY;  Surgeon: Arnie Lao, MD;  Location: [facility];  Service: Orthopedics;  Laterality: Right;   TOTAL KNEE ARTHROPLASTY Left 10/17/2019   TOTAL KNEE ARTHROPLASTY Left 10/17/2019   Procedure: LEFT TOTAL KNEE ARTHROPLASTY;  Surgeon: Arnie Lao, MD;  Location: [facility];  Service: Orthopedics;  Laterality: Left;    Current Outpatient Medications  Medication Sig Dispense Refill   aspirin  EC 81 MG tablet Take 81 mg by mouth daily. Swallow whole.     atenolol  (TENORMIN ) 25 MG tablet Take 25 mg by mouth daily.     oxyCODONE -acetaminophen  (PERCOCET/ROXICET) 5-325 MG tablet Take 1 tablet by mouth every 6 (six) hours as needed.     rosuvastatin (CRESTOR) 5 MG tablet Take 5 mg by mouth daily.     sucralfate (CARAFATE) 1 g tablet Take 1 g by mouth 4 (four) times daily -  with meals and at bedtime.     methylPREDNISolone  (MEDROL ) 4 MG tablet Medrol  dose pack. Take as instructed (Patient not taking: Reported on 03/16/2024) 21 tablet 0   omeprazole  (PRILOSEC) 40 MG capsule Take 1 capsule (40 mg total) by mouth daily. (Patient not taking: Reported on 11/26/2021) 30 capsule 5   No current  facility-administered medications for this visit.    Allergies as of 03/16/2024 - Review Complete 03/16/2024  Allergen Reaction Noted   Flexeril  [cyclobenzaprine ] Nausea Only 09/24/2017   Hydrocodone  Nausea Only 08/03/2017   Dexlansoprazole Rash 02/22/2021   Lyrica [pregabalin] Palpitations 08/03/2017   Neurontin  [gabapentin ] Palpitations 12/28/2016    Family History  Problem Relation Age of Onset   Hypertension Mother    Stomach cancer Mother 78   Stomach cancer Maternal Aunt        late 59s   Breast cancer Neg Hx    Stroke Neg Hx    Colon cancer Neg Hx     Social History   Socioeconomic History   Marital status: Married    Spouse name: Not on file   Number of children: Not on file   Years of education: Not on file   Highest education level: Not on file  Occupational History   Not on file  Tobacco Use   Smoking status: Never   Smokeless tobacco: Never  Vaping Use   Vaping status: Never Used  Substance and Sexual Activity   Alcohol use: No   Drug use: No   Sexual  activity: Not Currently    Birth control/protection: Post-menopausal  Other Topics Concern   Not on file  Social History Narrative   Not on file   Social Drivers of Health   Financial Resource Strain: Not on file  Food Insecurity: Not on file  Transportation Needs: Not on file  Physical Activity: Not on file  Stress: Not on file  Social Connections: Not on file  Intimate Partner Violence: Not on file    Subjective: Review of Systems  Constitutional:  Negative for chills and fever.  HENT:  Negative for congestion and hearing loss.   Eyes:  Negative for blurred vision and double vision.  Respiratory:  Negative for cough and shortness of breath.   Cardiovascular:  Negative for chest pain and palpitations.  Gastrointestinal:  Positive for heartburn. Negative for abdominal pain, blood in stool, constipation, diarrhea, melena and vomiting.  Genitourinary:  Negative for dysuria and urgency.   Musculoskeletal:  Negative for joint pain and myalgias.  Skin:  Negative for itching and rash.  Neurological:  Negative for dizziness and headaches.  Psychiatric/Behavioral:  Negative for depression. The patient is not nervous/anxious.        Objective: BP 133/79   Pulse 82   Temp 97.8 F (36.6 C)   Ht 5\' 4"  (1.626 m)   Wt 227 lb 12.8 oz (103.3 kg)   LMP  (LMP Unknown)   BMI 39.10 kg/m  Physical Exam Constitutional:      Appearance: Normal appearance.  HENT:     Head: Normocephalic and atraumatic.  Eyes:     Extraocular Movements: Extraocular movements intact.     Conjunctiva/sclera: Conjunctivae normal.  Cardiovascular:     Rate and Rhythm: Normal rate and regular rhythm.     Heart sounds: Murmur heard.  Pulmonary:     Effort: Pulmonary effort is normal.     Breath sounds: Normal breath sounds.  Abdominal:     General: Bowel sounds are normal.     Palpations: Abdomen is soft.  Musculoskeletal:        General: No swelling. Normal range of motion.     Cervical back: Normal range of motion and neck supple.  Skin:    General: Skin is warm and dry.     Coloration: Skin is not jaundiced.  Neurological:     General: No focal deficit present.     Mental Status: She is alert and oriented to person, place, and time.  Psychiatric:        Mood and Affect: Mood normal.        Behavior: Behavior normal.      Assessment/Plan:  1.  Chronic GERD-symptoms steadily improving.  Dysphagia resolved.  Will resume on pantoprazole  40 mg daily as she previously tolerated this.  Omeprazole  giving her nausea.  Follow-up in 6 to 8 weeks, if not improved, then we can consider upper endoscopy to further evaluate.  2.  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  Symptoms well-controlled on Dulcolax.  Will continue.  3.  History of adenomatous colon polyps-colonoscopy recall 2027 if benefits outweigh risks.  Follow-up in 6 to 8 weeks.  03/16/2024 9:40  AM   Disclaimer: This note was dictated with voice recognition software. Similar sounding words can inadvertently be transcribed and may not be corrected upon review.

## 2024-03-16 NOTE — Patient Instructions (Addendum)
 I am going to restart you on pantoprazole  40 mg daily for your chronic reflux.  Follow-up in 6 to 8 weeks, if symptoms are not improved then we can consider repeat upper endoscopy.  Continue on Dulcolax for your chronic constipation.  It was very nice seeing both you today.  Dr. Mordechai April

## 2024-03-16 NOTE — Telephone Encounter (Signed)
 Meredith from [employer] phoned advising that you had sent in a Rx for Protonix  for this pt and she just picked up a Rx from Dr Del Favia for Omeprazole  with the same directions. She states this is a conflict of treatment. Please advise @ [phone]

## 2024-03-17 NOTE — Telephone Encounter (Signed)
 Phoned to [place] and spoke with [name] and was advised by her that she and the pt had a discussion yesterday about this and they had sorted it out that she needs to be on pantoprazole .

## 2024-03-17 NOTE — Telephone Encounter (Signed)
 I am stopping her omeprazole  and switching to pantoprazole  as she previously tolerated pantoprazole  better.  Omeprazole  made her "sick."

## 2024-03-27 DIAGNOSIS — N1831 Chronic kidney disease, stage 3a: Secondary | ICD-10-CM | POA: Diagnosis not present

## 2024-03-27 DIAGNOSIS — I10 Essential (primary) hypertension: Secondary | ICD-10-CM | POA: Diagnosis not present

## 2024-03-27 DIAGNOSIS — M8580 Other specified disorders of bone density and structure, unspecified site: Secondary | ICD-10-CM | POA: Diagnosis not present

## 2024-03-27 DIAGNOSIS — E782 Mixed hyperlipidemia: Secondary | ICD-10-CM | POA: Diagnosis not present

## 2024-04-05 DIAGNOSIS — E782 Mixed hyperlipidemia: Secondary | ICD-10-CM | POA: Diagnosis not present

## 2024-04-05 DIAGNOSIS — M48061 Spinal stenosis, lumbar region without neurogenic claudication: Secondary | ICD-10-CM | POA: Diagnosis not present

## 2024-04-05 DIAGNOSIS — R221 Localized swelling, mass and lump, neck: Secondary | ICD-10-CM | POA: Diagnosis not present

## 2024-04-05 DIAGNOSIS — M8580 Other specified disorders of bone density and structure, unspecified site: Secondary | ICD-10-CM | POA: Diagnosis not present

## 2024-04-05 DIAGNOSIS — I10 Essential (primary) hypertension: Secondary | ICD-10-CM | POA: Diagnosis not present

## 2024-04-05 DIAGNOSIS — R7303 Prediabetes: Secondary | ICD-10-CM | POA: Diagnosis not present

## 2024-04-05 DIAGNOSIS — J302 Other seasonal allergic rhinitis: Secondary | ICD-10-CM | POA: Diagnosis not present

## 2024-04-05 DIAGNOSIS — N1831 Chronic kidney disease, stage 3a: Secondary | ICD-10-CM | POA: Diagnosis not present

## 2024-04-05 DIAGNOSIS — K2100 Gastro-esophageal reflux disease with esophagitis, without bleeding: Secondary | ICD-10-CM | POA: Diagnosis not present

## 2024-04-05 DIAGNOSIS — K5909 Other constipation: Secondary | ICD-10-CM | POA: Diagnosis not present

## 2024-04-05 DIAGNOSIS — D509 Iron deficiency anemia, unspecified: Secondary | ICD-10-CM | POA: Diagnosis not present

## 2024-04-05 DIAGNOSIS — G4700 Insomnia, unspecified: Secondary | ICD-10-CM | POA: Diagnosis not present

## 2024-04-17 ENCOUNTER — Other Ambulatory Visit: Payer: Self-pay | Admitting: Internal Medicine

## 2024-04-17 DIAGNOSIS — Z1231 Encounter for screening mammogram for malignant neoplasm of breast: Secondary | ICD-10-CM

## 2024-04-17 DIAGNOSIS — E782 Mixed hyperlipidemia: Secondary | ICD-10-CM | POA: Diagnosis not present

## 2024-04-17 DIAGNOSIS — I10 Essential (primary) hypertension: Secondary | ICD-10-CM | POA: Diagnosis not present

## 2024-04-17 DIAGNOSIS — K2100 Gastro-esophageal reflux disease with esophagitis, without bleeding: Secondary | ICD-10-CM | POA: Diagnosis not present

## 2024-04-17 DIAGNOSIS — G4700 Insomnia, unspecified: Secondary | ICD-10-CM | POA: Diagnosis not present

## 2024-04-18 DIAGNOSIS — K2100 Gastro-esophageal reflux disease with esophagitis, without bleeding: Secondary | ICD-10-CM | POA: Diagnosis not present

## 2024-04-18 DIAGNOSIS — I10 Essential (primary) hypertension: Secondary | ICD-10-CM | POA: Diagnosis not present

## 2024-04-18 DIAGNOSIS — G4700 Insomnia, unspecified: Secondary | ICD-10-CM | POA: Diagnosis not present

## 2024-05-03 ENCOUNTER — Ambulatory Visit
Admission: RE | Admit: 2024-05-03 | Discharge: 2024-05-03 | Disposition: A | Discharge status: Home / Self Care | Source: Ambulatory Visit | Attending: Internal Medicine | Admitting: Internal Medicine

## 2024-05-03 DIAGNOSIS — Z1231 Encounter for screening mammogram for malignant neoplasm of breast: Secondary | ICD-10-CM

## 2024-05-11 NOTE — Progress Notes (Signed)
 Referring Provider: Shona Norleen PEDLAR, MD Primary Care Physician:  Shona Norleen PEDLAR, MD Primary GI Physician: Dr. Cindie  Chief Complaint  Patient presents with   Follow-up    Follow up. No problems     HPI:   [name] is a 78 y.o. female with history of GERD with esophagitis, drug-induced constipation, adenomatous colon polyps, presenting today for follow-up.  Last seen in the office 03/16/2024.  She had been dealing with sore throat for 3 to 4 weeks.  Had been off of PPI for quite some time.  Had more recently been prescribed omeprazole  and Carafate which made her stomach sick, but symptoms gradually improving.  Also had dysphagia at the onset of symptoms, but this was also improved.  She was currently her chronic constipation with daily Dulcolax.  Recommended resuming pantoprazole  40 mg daily as she previously did well with this.  Follow-up in 6-8 weeks.  If persistent symptoms, consider EGD.   Today: GERD:  Well-controlled on pantoprazole  40 mg daily.  Prior sore throat resolved.  No nausea, vomiting, abdominal pain.  Dysphagia: Resolved with pantoprazole .  No other GI concerns.   Prior endoscopic evaluation: EGD 05/06/2021 showed esophagitis had healed, gastritis, normal duodenum.  GE junction biopsies with mild inflammation.  No Barrett's esophagus.   EGD 03/11/2021 with LA grade D esophagitis, gastritis, normal duodenum.  Gastric biopsies negative for H. pylori.  Placed on PPI twice daily.   EGD June 2009 for right flank pain with circumferential distal esophageal erosions consistent with mild erosive reflux esophagitis, small hiatal hernia, antral erosions, areas of scar versus adenomatous changes s/p biopsy (reactive gastropathy, negative H. pylori), normal examined duodenum.   Colonoscopy 03/11/2021 with internal hemorrhoids, sigmoid diverticulosis, 6 polyps removed, all very small 1 to 2 mm and one 4 mm polyp. Polyps were a mix of tubular adenomas and benign polyps.  Recommended 5-year recall.   Past Medical History:  Diagnosis Date   Arthritis    Cataracts, bilateral    Chronic back pain    Essential hypertension    GERD (gastroesophageal reflux disease)    Impaired fasting glucose    Iron deficiency anemia    Mixed hyperlipidemia    Spinal stenosis    Stroke (HCC) 2019   Wears dentures     Past Surgical History:  Procedure Laterality Date   ABDOMINAL HYSTERECTOMY  82   APPENDECTOMY     BACK SURGERY  2010   BACK SURGERY  2015   BACK SURGERY  06/2016   BIOPSY  03/11/2021   Procedure: BIOPSY;  Surgeon: Cindie Carlin POUR, DO;  [facility];  Service: Endoscopy;;   BIOPSY  05/06/2021   Procedure: BIOPSY;  Surgeon: Cindie Carlin POUR, DO;  [facility];  Service: Endoscopy;;   CATARACT EXTRACTION W/PHACO Left 01/07/2017   Procedure: CATARACT EXTRACTION PHACO AND INTRAOCULAR LENS PLACEMENT (IOC);  Surgeon: Cherene Mania, MD;  Location: [facility];  Service: Ophthalmology;  Laterality: Left;  left CDE 9.31    CATARACT EXTRACTION W/PHACO Right 01/25/2017   Procedure: CATARACT EXTRACTION PHACO AND INTRAOCULAR LENS PLACEMENT (IOC) CDE:16.07;  Surgeon: Cherene Mania, MD;  Location: [facility];  Service: Ophthalmology;  Laterality: Right;  right   COLONOSCOPY  04/2008   Surgeon: Dr. Shaaron; anal papilla, internal hemorrhoids, 5 mm polyp and diminutive polyp removed.  Pathology with 1 tubular adenoma and benign colonic mucosa with focal surface erosion.  Recommend repeat colonoscopy in 5 years.   COLONOSCOPY WITH PROPOFOL  N/A  03/11/2021   Procedure: COLONOSCOPY WITH PROPOFOL ;  Surgeon: Cindie Carlin POUR, DO;  [facility];  Service: Endoscopy;  Laterality: N/A;  PM   ESOPHAGOGASTRODUODENOSCOPY  04/2008   Surgeon: Dr. Shaaron; circumferential distal esophageal erosions consistent with nonerosive reflux esophagitis, small hiatal hernia, antral erosions, area of scar versus adenomatous change s/p biopsy (reactive gastropathy, negative H.  pylori), normal examined duodenum.   ESOPHAGOGASTRODUODENOSCOPY (EGD) WITH PROPOFOL  N/A 03/11/2021   Procedure: ESOPHAGOGASTRODUODENOSCOPY (EGD) WITH PROPOFOL ;  Surgeon: Cindie Carlin POUR, DO;  [facility];  Service: Endoscopy;  Laterality: N/A;   ESOPHAGOGASTRODUODENOSCOPY (EGD) WITH PROPOFOL  N/A 05/06/2021   Procedure: ESOPHAGOGASTRODUODENOSCOPY (EGD) WITH PROPOFOL ;  Surgeon: Cindie Carlin POUR, DO;  [facility];  Service: Endoscopy;  Laterality: N/A;  7:30am   FRACTURE SURGERY Right    arm  1974   LUMBAR WOUND DEBRIDEMENT N/A 09/23/2017   Procedure: Exploration of Lumbar Wound; Repair of Pseudomeningocele;  Surgeon: Mavis Purchase, MD;  Location: [facility];  Service: Neurosurgery;  Laterality: N/A;   POLYPECTOMY  03/11/2021   Procedure: POLYPECTOMY INTESTINAL;  Surgeon: Cindie Carlin POUR, DO;  [facility];  Service: Endoscopy;;   TONSILLECTOMY     TOTAL KNEE ARTHROPLASTY Right 05/30/2019   Procedure: RIGHT TOTAL KNEE ARTHROPLASTY;  Surgeon: Vernetta Lonni GRADE, MD;  Location: [facility];  Service: Orthopedics;  Laterality: Right;   TOTAL KNEE ARTHROPLASTY Left 10/17/2019   TOTAL KNEE ARTHROPLASTY Left 10/17/2019   Procedure: LEFT TOTAL KNEE ARTHROPLASTY;  Surgeon: Vernetta Lonni GRADE, MD;  Location: [facility];  Service: Orthopedics;  Laterality: Left;    Current Outpatient Medications  Medication Sig Dispense Refill   aspirin  EC 81 MG tablet Take 81 mg by mouth daily. Swallow whole.     atenolol  (TENORMIN ) 25 MG tablet Take 25 mg by mouth daily.     oxyCODONE -acetaminophen  (PERCOCET/ROXICET) 5-325 MG tablet Take 1 tablet by mouth every 6 (six) hours as needed.     pantoprazole  (PROTONIX ) 40 MG tablet Take 1 tablet (40 mg total) by mouth daily. 30 tablet 11   rosuvastatin (CRESTOR) 5 MG tablet Take 5 mg by mouth daily.     No current facility-administered medications for this visit.    Allergies as of 05/12/2024 - Review Complete 05/12/2024  Allergen  Reaction Noted   Flexeril  [cyclobenzaprine ] Nausea Only 09/24/2017   Hydrocodone  Nausea Only 08/03/2017   Omeprazole   05/12/2024   Dexlansoprazole Rash 02/22/2021   Lyrica [pregabalin] Palpitations 08/03/2017   Neurontin  [gabapentin ] Palpitations 12/28/2016    Family History  Problem Relation Age of Onset   Hypertension Mother    Stomach cancer Mother 10   Stomach cancer Maternal Aunt        late 41s   Breast cancer Neg Hx    Stroke Neg Hx    Colon cancer Neg Hx     Social History   Socioeconomic History   Marital status: Married    Spouse name: Not on file   Number of children: Not on file   Years of education: Not on file   Highest education level: Not on file  Occupational History   Not on file  Tobacco Use   Smoking status: Never   Smokeless tobacco: Never  Vaping Use   Vaping status: Never Used  Substance and Sexual Activity   Alcohol use: No   Drug use: No   Sexual activity: Not Currently    Birth control/protection: Post-menopausal  Other Topics Concern   Not on file  Social  History Narrative   Not on file   Social Drivers of Health   Financial Resource Strain: Not on file  Food Insecurity: Not on file  Transportation Needs: Not on file  Physical Activity: Not on file  Stress: Not on file  Social Connections: Not on file    Review of Systems: Gen: Denies fever, chills, cold or flulike symptoms, presyncope, syncope. GI: See HPI Heme: See HPI  Physical Exam: BP 135/79 (BP Location: Right Arm, Patient Position: Sitting, Cuff Size: Large)   Pulse 66   Temp 97.7 F (36.5 C) (Temporal)   Ht 5' 4 (1.626 m)   Wt 223 lb 6.4 oz (101.3 kg)   LMP  (LMP Unknown)   BMI 38.35 kg/m  General:   Alert and oriented. No distress noted. Pleasant and cooperative.  Head:  Normocephalic and atraumatic. Eyes:  Conjuctiva clear without scleral icterus. Abdomen:  +BS, soft, non-tender and non-distended. No rebound or guarding. No HSM or masses noted. Msk:   Symmetrical without gross deformities. Normal posture. Neurologic:  Alert and  oriented x4 Psych:  Normal mood and affect.    Assessment:  78 y.o. female with history of GERD with esophagitis, drug-induced constipation, adenomatous colon polyps, presenting today for follow-up of GERD and dysphagia.   GERD: Well-controlled with pantoprazole  40 mg daily.  Dysphagia: Resolved with pantoprazole  40 mg daily.   Plan:  Continue pantoprazole  40 mg daily. Follow-up in 6 months or sooner if needed.   Josette Centers, PA-C [facility] 05/12/2024

## 2024-05-12 ENCOUNTER — Ambulatory Visit: Admitting: Gastroenterology

## 2024-05-12 ENCOUNTER — Encounter: Payer: Self-pay | Admitting: Gastroenterology

## 2024-05-12 VITALS — BP 135/79 | HR 66 | Temp 97.7°F | Ht 64.0 in | Wt 223.4 lb

## 2024-05-12 DIAGNOSIS — K219 Gastro-esophageal reflux disease without esophagitis: Secondary | ICD-10-CM

## 2024-05-12 DIAGNOSIS — R1319 Other dysphagia: Secondary | ICD-10-CM

## 2024-05-12 NOTE — Patient Instructions (Signed)
 Continue pantoprazole  40 mg daily.  We will follow-up in 6 months or sooner if needed.  Josette Centers, PA-C [facility]

## 2024-05-16 DIAGNOSIS — E782 Mixed hyperlipidemia: Secondary | ICD-10-CM | POA: Diagnosis not present

## 2024-05-16 DIAGNOSIS — I10 Essential (primary) hypertension: Secondary | ICD-10-CM | POA: Diagnosis not present

## 2024-05-16 DIAGNOSIS — M8580 Other specified disorders of bone density and structure, unspecified site: Secondary | ICD-10-CM | POA: Diagnosis not present

## 2024-05-16 DIAGNOSIS — K2100 Gastro-esophageal reflux disease with esophagitis, without bleeding: Secondary | ICD-10-CM | POA: Diagnosis not present

## 2024-05-23 DIAGNOSIS — M48061 Spinal stenosis, lumbar region without neurogenic claudication: Secondary | ICD-10-CM | POA: Diagnosis not present

## 2024-05-23 DIAGNOSIS — K5909 Other constipation: Secondary | ICD-10-CM | POA: Diagnosis not present

## 2024-05-23 DIAGNOSIS — K2100 Gastro-esophageal reflux disease with esophagitis, without bleeding: Secondary | ICD-10-CM | POA: Diagnosis not present

## 2024-05-23 DIAGNOSIS — N1831 Chronic kidney disease, stage 3a: Secondary | ICD-10-CM | POA: Diagnosis not present

## 2024-05-23 DIAGNOSIS — R7303 Prediabetes: Secondary | ICD-10-CM | POA: Diagnosis not present

## 2024-05-23 DIAGNOSIS — I10 Essential (primary) hypertension: Secondary | ICD-10-CM | POA: Diagnosis not present

## 2024-05-23 DIAGNOSIS — G4700 Insomnia, unspecified: Secondary | ICD-10-CM | POA: Diagnosis not present

## 2024-05-23 DIAGNOSIS — E782 Mixed hyperlipidemia: Secondary | ICD-10-CM | POA: Diagnosis not present

## 2024-06-06 DIAGNOSIS — R011 Cardiac murmur, unspecified: Secondary | ICD-10-CM | POA: Diagnosis not present

## 2024-06-06 DIAGNOSIS — Z96652 Presence of left artificial knee joint: Secondary | ICD-10-CM | POA: Diagnosis not present

## 2024-06-06 DIAGNOSIS — M25572 Pain in left ankle and joints of left foot: Secondary | ICD-10-CM | POA: Diagnosis not present

## 2024-06-06 DIAGNOSIS — R221 Localized swelling, mass and lump, neck: Secondary | ICD-10-CM | POA: Diagnosis not present

## 2024-06-06 DIAGNOSIS — I10 Essential (primary) hypertension: Secondary | ICD-10-CM | POA: Diagnosis not present

## 2024-06-06 DIAGNOSIS — G4700 Insomnia, unspecified: Secondary | ICD-10-CM | POA: Diagnosis not present

## 2024-06-06 DIAGNOSIS — K2100 Gastro-esophageal reflux disease with esophagitis, without bleeding: Secondary | ICD-10-CM | POA: Diagnosis not present

## 2024-06-06 DIAGNOSIS — Z713 Dietary counseling and surveillance: Secondary | ICD-10-CM | POA: Diagnosis not present

## 2024-06-06 DIAGNOSIS — Z96651 Presence of right artificial knee joint: Secondary | ICD-10-CM | POA: Diagnosis not present

## 2024-06-06 DIAGNOSIS — M48061 Spinal stenosis, lumbar region without neurogenic claudication: Secondary | ICD-10-CM | POA: Diagnosis not present

## 2024-06-16 DIAGNOSIS — I10 Essential (primary) hypertension: Secondary | ICD-10-CM | POA: Diagnosis not present

## 2024-06-16 DIAGNOSIS — K2100 Gastro-esophageal reflux disease with esophagitis, without bleeding: Secondary | ICD-10-CM | POA: Diagnosis not present

## 2024-06-16 DIAGNOSIS — E782 Mixed hyperlipidemia: Secondary | ICD-10-CM | POA: Diagnosis not present

## 2024-06-20 DIAGNOSIS — K5909 Other constipation: Secondary | ICD-10-CM | POA: Diagnosis not present

## 2024-06-20 DIAGNOSIS — K2100 Gastro-esophageal reflux disease with esophagitis, without bleeding: Secondary | ICD-10-CM | POA: Diagnosis not present

## 2024-06-20 DIAGNOSIS — N1831 Chronic kidney disease, stage 3a: Secondary | ICD-10-CM | POA: Diagnosis not present

## 2024-06-20 DIAGNOSIS — M48061 Spinal stenosis, lumbar region without neurogenic claudication: Secondary | ICD-10-CM | POA: Diagnosis not present

## 2024-06-20 DIAGNOSIS — R7303 Prediabetes: Secondary | ICD-10-CM | POA: Diagnosis not present

## 2024-06-20 DIAGNOSIS — I10 Essential (primary) hypertension: Secondary | ICD-10-CM | POA: Diagnosis not present

## 2024-06-20 DIAGNOSIS — G4700 Insomnia, unspecified: Secondary | ICD-10-CM | POA: Diagnosis not present

## 2024-06-20 DIAGNOSIS — E782 Mixed hyperlipidemia: Secondary | ICD-10-CM | POA: Diagnosis not present

## 2024-07-18 DIAGNOSIS — K5909 Other constipation: Secondary | ICD-10-CM | POA: Diagnosis not present

## 2024-07-18 DIAGNOSIS — M48061 Spinal stenosis, lumbar region without neurogenic claudication: Secondary | ICD-10-CM | POA: Diagnosis not present

## 2024-07-18 DIAGNOSIS — E782 Mixed hyperlipidemia: Secondary | ICD-10-CM | POA: Diagnosis not present

## 2024-07-18 DIAGNOSIS — N1831 Chronic kidney disease, stage 3a: Secondary | ICD-10-CM | POA: Diagnosis not present

## 2024-07-18 DIAGNOSIS — K2100 Gastro-esophageal reflux disease with esophagitis, without bleeding: Secondary | ICD-10-CM | POA: Diagnosis not present

## 2024-07-18 DIAGNOSIS — I10 Essential (primary) hypertension: Secondary | ICD-10-CM | POA: Diagnosis not present

## 2024-07-18 DIAGNOSIS — G4700 Insomnia, unspecified: Secondary | ICD-10-CM | POA: Diagnosis not present

## 2024-07-18 DIAGNOSIS — R7303 Prediabetes: Secondary | ICD-10-CM | POA: Diagnosis not present

## 2024-08-30 DIAGNOSIS — R7303 Prediabetes: Secondary | ICD-10-CM | POA: Diagnosis not present

## 2024-08-30 DIAGNOSIS — M8580 Other specified disorders of bone density and structure, unspecified site: Secondary | ICD-10-CM | POA: Diagnosis not present

## 2024-08-30 DIAGNOSIS — E782 Mixed hyperlipidemia: Secondary | ICD-10-CM | POA: Diagnosis not present

## 2024-09-05 ENCOUNTER — Encounter: Payer: Self-pay | Admitting: Internal Medicine

## 2024-09-05 DIAGNOSIS — K2100 Gastro-esophageal reflux disease with esophagitis, without bleeding: Secondary | ICD-10-CM | POA: Diagnosis not present

## 2024-09-05 DIAGNOSIS — R011 Cardiac murmur, unspecified: Secondary | ICD-10-CM | POA: Diagnosis not present

## 2024-09-05 DIAGNOSIS — M25572 Pain in left ankle and joints of left foot: Secondary | ICD-10-CM | POA: Diagnosis not present

## 2024-09-05 DIAGNOSIS — Z96651 Presence of right artificial knee joint: Secondary | ICD-10-CM | POA: Diagnosis not present

## 2024-09-05 DIAGNOSIS — M48061 Spinal stenosis, lumbar region without neurogenic claudication: Secondary | ICD-10-CM | POA: Diagnosis not present

## 2024-09-05 DIAGNOSIS — R7303 Prediabetes: Secondary | ICD-10-CM | POA: Diagnosis not present

## 2024-09-05 DIAGNOSIS — I129 Hypertensive chronic kidney disease with stage 1 through stage 4 chronic kidney disease, or unspecified chronic kidney disease: Secondary | ICD-10-CM | POA: Diagnosis not present

## 2024-09-05 DIAGNOSIS — E782 Mixed hyperlipidemia: Secondary | ICD-10-CM | POA: Diagnosis not present

## 2024-09-05 DIAGNOSIS — R221 Localized swelling, mass and lump, neck: Secondary | ICD-10-CM | POA: Diagnosis not present

## 2024-09-05 DIAGNOSIS — D509 Iron deficiency anemia, unspecified: Secondary | ICD-10-CM | POA: Diagnosis not present

## 2024-09-05 DIAGNOSIS — G4700 Insomnia, unspecified: Secondary | ICD-10-CM | POA: Diagnosis not present

## 2024-09-05 DIAGNOSIS — Z96652 Presence of left artificial knee joint: Secondary | ICD-10-CM | POA: Diagnosis not present

## 2024-09-06 DIAGNOSIS — Z981 Arthrodesis status: Secondary | ICD-10-CM | POA: Diagnosis not present

## 2024-09-06 DIAGNOSIS — M5416 Radiculopathy, lumbar region: Secondary | ICD-10-CM | POA: Diagnosis not present

## 2024-09-07 ENCOUNTER — Other Ambulatory Visit: Payer: Self-pay

## 2024-09-07 DIAGNOSIS — M5416 Radiculopathy, lumbar region: Secondary | ICD-10-CM

## 2024-09-13 ENCOUNTER — Encounter: Payer: Self-pay | Admitting: *Deleted

## 2024-09-13 ENCOUNTER — Ambulatory Visit: Attending: Cardiology | Admitting: Cardiology

## 2024-09-13 ENCOUNTER — Encounter: Payer: Self-pay | Admitting: Cardiology

## 2024-09-13 ENCOUNTER — Telehealth: Payer: Self-pay | Admitting: Cardiology

## 2024-09-13 VITALS — BP 168/98 | HR 61 | Ht 64.0 in | Wt 221.6 lb

## 2024-09-13 DIAGNOSIS — R011 Cardiac murmur, unspecified: Secondary | ICD-10-CM | POA: Diagnosis not present

## 2024-09-13 DIAGNOSIS — R072 Precordial pain: Secondary | ICD-10-CM | POA: Diagnosis not present

## 2024-09-13 DIAGNOSIS — I10 Essential (primary) hypertension: Secondary | ICD-10-CM | POA: Diagnosis not present

## 2024-09-13 DIAGNOSIS — Z79899 Other long term (current) drug therapy: Secondary | ICD-10-CM

## 2024-09-13 DIAGNOSIS — R0602 Shortness of breath: Secondary | ICD-10-CM | POA: Diagnosis not present

## 2024-09-13 DIAGNOSIS — E782 Mixed hyperlipidemia: Secondary | ICD-10-CM | POA: Diagnosis not present

## 2024-09-13 MED ORDER — ROSUVASTATIN CALCIUM 10 MG PO TABS: 10.0000 mg | ORAL_TABLET | Freq: Every day | ORAL | 1 refills | Status: AC

## 2024-09-13 MED ORDER — LOSARTAN POTASSIUM 25 MG PO TABS: 25.0000 mg | ORAL_TABLET | Freq: Every day | ORAL | 1 refills | Status: AC

## 2024-09-13 NOTE — Patient Instructions (Addendum)
 Medication Instructions:  Your physician has recommended you make the following change in your medication:  Start losartan  25 mg daily Increase rosuvastatin to 10 mg daily Continue all other medications as prescribed  Labwork: BMET in 2 weeks around 09/27/2024 Non-fasting [facility] ([address] [place])  Testing/Procedures: Your physician has requested that you have a lexiscan myoview. For further information please visit [email or web address] Please follow instruction sheet, as given. Your physician has requested that you have an echocardiogram. Echocardiography is a painless test that uses sound waves to create images of your heart. It provides your doctor with information about the size and shape of your heart and how well your heart's chambers and valves are working. This procedure takes approximately one hour. There are no restrictions for this procedure. Please do NOT wear cologne, perfume, aftershave, or lotions (deodorant is allowed). Please arrive 15 minutes prior to your appointment time.  Please note: We ask at that you not bring children with you during ultrasound (echo/ vascular) testing. Due to room size and safety concerns, children are not allowed in the ultrasound rooms during exams. Our front office staff cannot provide observation of children in our lobby area while testing is being conducted. An adult accompanying a patient to their appointment will only be allowed in the ultrasound room at the discretion of the ultrasound technician under special circumstances. We apologize for any inconvenience.  Follow-Up: Your physician recommends that you schedule a follow-up appointment in: pending  Any Other Special Instructions Will Be Listed Below (If Applicable).  If you need a refill on your cardiac medications before your next appointment, please call your pharmacy.

## 2024-09-13 NOTE — Progress Notes (Signed)
    Cardiology Office Note  Date: 09/13/2024   ID: [name] July 15, 1946, MRN [id number]  History of Present Illness: [name] is a 78 y.o. female last seen in January 2023 and referred back to the office by Dr. Shona for follow-up.  She is here today with her husband.  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  She states that over the last month she has been having intermittent episodes of chest tightness, predominantly in the evenings.  Not necessarily exertional in nature.  She has also been under emotional stress given illness in her grandson.  No sense of palpitations and no syncope.  She has a history of heart murmur. Echocardiogram in February 2023 showed LVEF 60 to 65% with moderately calcified aortic valve but no stenosis.  No known history of ischemic heart disease. I reviewed her ECG today which shows normal sinus rhythm.  She also tells me that her blood pressure has been up.  We went over her medications and discussed adjustments.  I reviewed her recent lab work as well.  Physical Exam: VS:  BP (!) 168/98   Pulse 61   Ht 5' 4 (1.626 m)   Wt 221 lb 9.6 oz (100.5 kg)   LMP  (LMP Unknown)   SpO2 95%   BMI 38.04 kg/m , BMI Body mass index is 38.04 kg/m.  Wt Readings from Last 3 Encounters:  09/13/24 221 lb 9.6 oz (100.5 kg)  05/12/24 223 lb 6.4 oz (101.3 kg)  03/16/24 227 lb 12.8 oz (103.3 kg)    General: Patient appears comfortable at rest. HEENT: Conjunctiva and lids normal. Neck: Supple, no elevated JVP or carotid bruits. Lungs: Clear to auscultation, nonlabored breathing at rest. Cardiac: Regular rate and rhythm, no S3, 3/6 systolic murmur. Extremities: No pitting edema.  ECG:  An ECG dated 11/26/2021 was personally reviewed today and demonstrated:  Sinus rhythm with low voltage.  Labwork:  October 2025: Hemoglobin 11.4, platelets 205, BUN 20, creatinine 0.96, GFR 61, potassium 5.3, AST 19, ALT 14, cholesterol 183, triglycerides 129,  HDL 46, LDL 114, hemoglobin A1c 6.2%  Other Studies Reviewed Today:  No interval cardiac testing for review today.  Assessment and Plan:  1.  Intermittent chest tightness over the last month as discussed above.  ECG is normal today.  She has no known history of ischemic heart disease, but no recent objective assessment.  Plan to obtain a Lexiscan Myoview for further evaluation.  We will also update echocardiogram to reevaluate aortic valve, although would be unlikely for sclerosis to progress to clinically significant stenosis within the last 2 years.   2.  Primary hypertension.  Blood pressure has been trending up.  Continue atenolol  25 mg daily and add Cozaar  25 mg daily.  Check BMET in 2 weeks.  Continue to track blood pressure at home.   3.  Mixed hyperlipidemia.  Recent LDL 114 and HDL 46.  Suggested increase in Crestor to 10 mg daily.  4.  Systolic murmur with evidence of moderately sclerotic aortic valve in 2023, but no stenosis at that time.  She does have a prominent systolic murmur in aortic position, follow-up echocardiogram will be obtained as discussed above.  Disposition:  Follow up test results.  Signed, [name], M.D., F.A.C.C. [facility] at [facility]

## 2024-09-13 NOTE — Telephone Encounter (Signed)
 Checking percert on the following patient for testing scheduled at [facility].    Lexiscan 09/20/2024

## 2024-09-20 ENCOUNTER — Encounter (HOSPITAL_COMMUNITY)
Admission: RE | Admit: 2024-09-20 | Discharge: 2024-09-20 | Disposition: A | Discharge status: Home / Self Care | Source: Ambulatory Visit | Attending: Cardiology | Admitting: Cardiology

## 2024-09-20 ENCOUNTER — Ambulatory Visit: Payer: Self-pay | Admitting: Cardiology

## 2024-09-20 ENCOUNTER — Ambulatory Visit (HOSPITAL_COMMUNITY)
Admission: RE | Admit: 2024-09-20 | Discharge: 2024-09-20 | Disposition: A | Discharge status: Home / Self Care | Source: Ambulatory Visit | Attending: Cardiology | Admitting: Cardiology

## 2024-09-20 ENCOUNTER — Other Ambulatory Visit: Payer: Self-pay | Admitting: Physician Assistant

## 2024-09-20 DIAGNOSIS — R011 Cardiac murmur, unspecified: Secondary | ICD-10-CM | POA: Diagnosis present

## 2024-09-20 DIAGNOSIS — R072 Precordial pain: Secondary | ICD-10-CM | POA: Insufficient documentation

## 2024-09-20 LAB — NM MYOCAR MULTI W/SPECT W/WALL MOTION / EF
Base ST Depression (mm): 0 mm
LV dias vol: 73 mL (ref 46–106)
LV sys vol: 29 mL (ref 3.8–5.2)
MPHR: 142 {beats}/min
Nuc Stress EF: 60 %
Peak HR: 93 {beats}/min
Percent HR: 65 %
RATE: 0.5
Rest HR: 62 {beats}/min
Rest Nuclear Isotope Dose: 10 mCi
SDS: 2
SRS: 0
SSS: 2
ST Depression (mm): 0 mm
Stress Nuclear Isotope Dose: 33 mCi
TID: 1.01

## 2024-09-20 MED ORDER — REGADENOSON 0.4 MG/5ML IV SOLN
INTRAVENOUS | Status: AC
  Administered 2024-09-20: 0.4 mg via INTRAVENOUS
  Filled 2024-09-20: qty 5

## 2024-09-20 MED ORDER — TECHNETIUM TC 99M TETROFOSMIN IV KIT
10.0000 | PACK | Freq: Once | INTRAVENOUS | Status: AC | PRN
  Administered 2024-09-20: 10 via INTRAVENOUS

## 2024-09-20 MED ORDER — SODIUM CHLORIDE FLUSH 0.9 % IV SOLN
INTRAVENOUS | Status: AC
  Filled 2024-09-20: qty 10

## 2024-09-20 MED ORDER — TECHNETIUM TC 99M TETROFOSMIN IV KIT
30.0000 | PACK | Freq: Once | INTRAVENOUS | Status: AC | PRN
  Administered 2024-09-20: 33 via INTRAVENOUS

## 2024-09-20 NOTE — Progress Notes (Signed)
     [name] presented for a Lexiscan nuclear stress test today.  I Lorette CINDERELLA Kapur, PA-C, provided direct supervision and was present during the stress portion of the study today, which was completed without significant symptoms, immediate complications, or acute ST/T changes on ECG.  Stress imaging is pending at this time.  Preliminary ECG findings may be listed in the chart, but the stress test result will not be finalized until perfusion imaging is complete.  Lorette CINDERELLA Kapur, PA-C  09/20/2024, 9:43 AM

## 2024-09-26 ENCOUNTER — Ambulatory Visit: Attending: Cardiology

## 2024-09-26 DIAGNOSIS — R011 Cardiac murmur, unspecified: Secondary | ICD-10-CM

## 2024-09-26 LAB — BASIC METABOLIC PANEL WITH GFR
BUN/Creatinine Ratio: 16 (ref 12–28)
BUN: 14 mg/dL (ref 8–27)
CO2: 19 mmol/L — ABNORMAL LOW (ref 20–29)
Calcium: 9.3 mg/dL (ref 8.7–10.3)
Chloride: 103 mmol/L (ref 96–106)
Creatinine, Ser: 0.9 mg/dL (ref 0.57–1.00)
Glucose: 110 mg/dL — ABNORMAL HIGH (ref 70–99)
Potassium: 4.7 mmol/L (ref 3.5–5.2)
Sodium: 138 mmol/L (ref 134–144)
eGFR: 65 mL/min/1.73 (ref >59)

## 2024-09-27 LAB — ECHOCARDIOGRAM COMPLETE
AR max vel: 1.82 cm2
AV Area VTI: 1.58 cm2
AV Area mean vel: 1.75 cm2
AV Mean grad: 10 mmHg
AV Peak grad: 16.8 mmHg
Ao pk vel: 2.05 m/s
Area-P 1/2: 2.94 cm2
Calc EF: 68.6 %
MV VTI: 2.21 cm2
S' Lateral: 2.3 cm
Single Plane A2C EF: 72.9 %
Single Plane A4C EF: 64.2 %

## 2024-10-04 ENCOUNTER — Encounter: Payer: Self-pay | Admitting: Gastroenterology

## 2024-10-05 ENCOUNTER — Ambulatory Visit (HOSPITAL_COMMUNITY)

## 2024-10-25 ENCOUNTER — Ambulatory Visit: Admitting: Cardiology

## 2024-11-12 ENCOUNTER — Encounter (HOSPITAL_COMMUNITY): Payer: Self-pay

## 2024-11-12 ENCOUNTER — Emergency Department (HOSPITAL_COMMUNITY)
Admission: EM | Admit: 2024-11-12 | Discharge: 2024-11-12 | Disposition: A | Discharge status: Home / Self Care | Attending: Emergency Medicine | Admitting: Emergency Medicine

## 2024-11-12 ENCOUNTER — Other Ambulatory Visit: Payer: Self-pay

## 2024-11-12 ENCOUNTER — Emergency Department (HOSPITAL_COMMUNITY)

## 2024-11-12 DIAGNOSIS — T148XXA Other injury of unspecified body region, initial encounter: Secondary | ICD-10-CM

## 2024-11-12 DIAGNOSIS — I10 Essential (primary) hypertension: Secondary | ICD-10-CM | POA: Diagnosis not present

## 2024-11-12 DIAGNOSIS — X501XXA Overexertion from prolonged static or awkward postures, initial encounter: Secondary | ICD-10-CM | POA: Diagnosis not present

## 2024-11-12 DIAGNOSIS — S40022A Contusion of left upper arm, initial encounter: Secondary | ICD-10-CM | POA: Diagnosis not present

## 2024-11-12 DIAGNOSIS — M79642 Pain in left hand: Secondary | ICD-10-CM | POA: Diagnosis present

## 2024-11-12 LAB — BASIC METABOLIC PANEL WITH GFR
Anion gap: 16 — ABNORMAL HIGH (ref 5–15)
BUN: 17 mg/dL (ref 8–23)
CO2: 21 mmol/L — ABNORMAL LOW (ref 22–32)
Calcium: 9 mg/dL (ref 8.9–10.3)
Chloride: 103 mmol/L (ref 98–111)
Creatinine, Ser: 0.79 mg/dL (ref 0.44–1.00)
GFR, Estimated: >60 mL/min
Glucose, Bld: 109 mg/dL — ABNORMAL HIGH (ref 70–99)
Potassium: 4.4 mmol/L (ref 3.5–5.1)
Sodium: 140 mmol/L (ref 135–145)

## 2024-11-12 LAB — CBC
HCT: 36.6 % (ref 36.0–46.0)
Hemoglobin: 11.3 g/dL — ABNORMAL LOW (ref 12.0–15.0)
MCH: 27.5 pg (ref 26.0–34.0)
MCHC: 30.9 g/dL (ref 30.0–36.0)
MCV: 89.1 fL (ref 80.0–100.0)
Platelets: 210 K/uL (ref 150–400)
RBC: 4.11 MIL/uL (ref 3.87–5.11)
RDW: 14.9 % (ref 11.5–15.5)
WBC: 5.8 K/uL (ref 4.0–10.5)
nRBC: 0 % (ref 0.0–0.2)

## 2024-11-12 LAB — PROTIME-INR
INR: 0.9 (ref 0.8–1.2)
Prothrombin Time: 13.2 s (ref 11.4–15.2)

## 2024-11-12 MED ORDER — IOHEXOL 350 MG/ML SOLN
100.0000 mL | Freq: Once | INTRAVENOUS | Status: AC | PRN
  Administered 2024-11-12: 100 mL via INTRAVENOUS

## 2024-11-12 NOTE — ED Triage Notes (Signed)
 Pt arrives ambulatory to ED with c/o pain and bruising to left arm since Christmas states that she was opening a gift and felt a 'pop' in her left upper arm bruising started and has worsened everyday since with some burning pain in the left arm. Bruising goes from mid upper arm to elbow crease.

## 2024-11-12 NOTE — Discharge Instructions (Signed)
 There is likely an injury to the muscle in your left arm causing the bleeding.  The wrapping up should help control the bleeding.  Although the bruising may spread more.  Watch for increasing pain.  Follow-up with orthopedic surgery.

## 2024-11-12 NOTE — ED Provider Notes (Signed)
 " [place] [facility] AT [facility] Provider Note   CSN: [id number] Arrival date & time: 11/12/24  1232     Patient presents with: Arm Injury   LAURIEANNE GALLOWAY is a 78 y.o. female.    Arm Injury Going.  Bruising.  On Christmas Day felt a pop in her left upper arm.  Pain in her left upper arm.  Developed bruising later.  Has progressed with more swelling and pain.  Good range of motion.  Not on blood thinners.    Past Medical History:  Diagnosis Date   Arthritis    Cataracts, bilateral    Chronic back pain    Essential hypertension    GERD (gastroesophageal reflux disease)    Impaired fasting glucose    Iron deficiency anemia    Mixed hyperlipidemia    Spinal stenosis    Stroke [facility]) 2019   Wears dentures     Prior to Admission medications  Medication Sig Start Date End Date Taking? Authorizing Provider  aspirin  EC 81 MG tablet Take 81 mg by mouth daily. Swallow whole.    [provider]  atenolol  (TENORMIN ) 25 MG tablet Take 25 mg by mouth daily. 05/14/19   [provider]  losartan  (COZAAR ) 25 MG tablet Take 1 tablet (25 mg total) by mouth daily. 09/13/24   Debera Jayson MATSU, MD  oxyCODONE -acetaminophen  (PERCOCET/ROXICET) 5-325 MG tablet Take 1 tablet by mouth every 6 (six) hours as needed.    [provider]  rosuvastatin  (CRESTOR ) 10 MG tablet Take 1 tablet (10 mg total) by mouth daily. 09/13/24   Debera Jayson MATSU, MD    Allergies: Flexeril  [cyclobenzaprine ], Hydrocodone , Omeprazole , Dexlansoprazole, Lyrica [pregabalin], and Neurontin  [gabapentin ]    Review of Systems  Updated Vital Signs BP (!) 186/78 (BP Location: Right Arm)   Pulse 74   Temp 98.4 F (36.9 C) (Oral)   Resp 18   Ht 5' 4 (1.626 m)   Wt 99.8 kg   LMP  (LMP Unknown)   SpO2 100%   BMI 37.76 kg/m   Physical Exam Vitals and nursing note reviewed.  Musculoskeletal:     Comments: Ecchymosis and swelling of the left upper arm.  Particularly  about halfway down and more distally.  Does have fullness.  On radial pulse.  Does have tenderness on the forearm but good range of motion of the elbow.  Skin:    Capillary Refill: Capillary refill takes less than 2 seconds.  Neurological:     Mental Status: She is alert and oriented to person, place, and time.     (all labs ordered are listed, but only abnormal results are displayed) Labs Reviewed  BASIC METABOLIC PANEL WITH GFR - Abnormal; Notable for the following components:      Result Value   CO2 21 (*)    Glucose, Bld 109 (*)    Anion gap 16 (*)    All other components within normal limits  CBC - Abnormal; Notable for the following components:   Hemoglobin 11.3 (*)    All other components within normal limits  PROTIME-INR    EKG: None  Radiology: No results found.   Procedures   Medications Ordered in the ED  iohexol  (OMNIPAQUE ) 350 MG/ML injection 100 mL (100 mLs Intravenous Contrast Given 11/12/24 1744)  Medical Decision Making Amount and/or Complexity of Data Reviewed Labs: ordered. Radiology: ordered.  Risk Prescription drug management.   Patient with bruising of left upper arm.  Began acutely.  Potentially muscle tear or equivalent.  However with worsening ecchymosis swelling and pain will get CT angiography to further evaluate.  Blood work reassuring.  However had extravasation of contrast right arm.  Will wrap him and follow-up in outpatient.  Patient did not want further imaging.  Do not think we need further imaging of the left extremity.  It also has been wrapped.  Can follow-up with Ortho.  Will discharge home.  Follow-up instructions and return precautions given.    Final diagnoses:  Hematoma    ED Discharge Orders     None          Patsey Lot, MD 11/12/24 2315  "

## 2024-11-12 NOTE — Progress Notes (Signed)
 CTA upper left extremity scan attempted.  IV extravasation occurred.

## 2024-12-21 ENCOUNTER — Encounter: Payer: Self-pay | Admitting: Cardiology

## 2024-12-21 ENCOUNTER — Ambulatory Visit: Admitting: Cardiology

## 2024-12-21 VITALS — BP 124/82 | HR 68 | Ht 64.0 in | Wt 226.2 lb

## 2024-12-21 DIAGNOSIS — I350 Nonrheumatic aortic (valve) stenosis: Secondary | ICD-10-CM | POA: Diagnosis not present

## 2024-12-21 DIAGNOSIS — I10 Essential (primary) hypertension: Secondary | ICD-10-CM

## 2024-12-21 DIAGNOSIS — E782 Mixed hyperlipidemia: Secondary | ICD-10-CM

## 2024-12-21 NOTE — Patient Instructions (Addendum)

## 2024-12-21 NOTE — Progress Notes (Signed)
 "    Cardiology Office Note  Date: 12/21/2024   ID: [name] 1946-05-20, MRN [id number]  History of Present Illness: [name] is a 79 y.o. female last seen in October 2025.  She is here for a follow-up visit.  States that she has been doing well, no recurring chest discomfort or change in stamina.  We discussed her interval cardiac testing including an echocardiogram and Lexiscan  Myoview  as noted below.  She had no evidence of ischemia and only mild aortic stenosis.  We went over her medications, no changes were made today.  Physical Exam: VS:  BP 124/82 (BP Location: Left Arm, Patient Position: Sitting, Cuff Size: Large)   Pulse 68   Ht 5' 4 (1.626 m)   Wt 226 lb 3.2 oz (102.6 kg)   LMP  (LMP Unknown)   SpO2 96%   BMI 38.83 kg/m , BMI Body mass index is 38.83 kg/m.  Wt Readings from Last 3 Encounters:  12/21/24 226 lb 3.2 oz (102.6 kg)  11/12/24 220 lb (99.8 kg)  09/13/24 221 lb 9.6 oz (100.5 kg)    General: Patient appears comfortable at rest. HEENT: Conjunctiva and lids normal. Neck: Supple, no elevated JVP or carotid bruits. Lungs: Clear to auscultation, nonlabored breathing at rest. Cardiac: Regular rate and rhythm, no S3, 3/6 systolic murmur.  ECG:  An ECG dated 09/13/2024 was personally reviewed today and demonstrated:  Sinus rhythm.  Labwork: October 2025: Hemoglobin 11.4, platelets 205, BUN 20, creatinine 0.96, GFR 61, potassium 5.3, AST 19, ALT 14, cholesterol 183, triglycerides 129, HDL 46, LDL 114, hemoglobin A1c 6.2%  11/12/2024: BUN 17; Creatinine, Ser 0.79; Hemoglobin 11.3; Platelets 210; Potassium 4.4; Sodium 140   Other Studies Reviewed Today:  Echocardiogram 09/13/2024:  1. Left ventricular ejection fraction, by estimation, is 60 to 65%. The  left ventricle has normal function. The left ventricle has no regional  wall motion abnormalities. There is mild left ventricular hypertrophy.  Left ventricular diastolic parameters  are consistent  with Grade I diastolic dysfunction (impaired relaxation).  Elevated left atrial pressure. The average left ventricular global  longitudinal strain is -17.5 %. The global longitudinal strain is normal.   2. Right ventricular systolic function is normal. The right ventricular  size is normal. There is mildly elevated pulmonary artery systolic  pressure.   3. The mitral valve is abnormal. Mild mitral valve regurgitation. No  evidence of mitral stenosis.   4. The tricuspid valve is abnormal.   5. The aortic valve is tricuspid. There is mild calcification of the  aortic valve. There is mild thickening of the aortic valve. Aortic valve  regurgitation is not visualized. Mild aortic valve stenosis. Aortic valve  area, by VTI measures 1.58 cm.  Aortic valve mean gradient measures 10.0 mmHg.   6. The inferior vena cava is normal in size with greater than 50%  respiratory variability, suggesting right atrial pressure of 3 mmHg.   Lexiscan  Myoview  09/20/2024:   Findings are consistent with no ischemia. The study is low risk.   No ST deviation was noted. The ECG was negative for ischemia.   LV perfusion is normal.  No significant myocardial perfusion defects to indicate scar or ischemia.   Left ventricular function is normal. Nuclear stress EF: 60%.  Assessment and Plan:  1.  Intermittent chest tightness, now resolved.  Lexiscan  Myoview  in November 2025 was negative for ischemia with LVEF 60%.  Continue observation at this point.   2.  Primary hypertension.  Blood pressure is adequately controlled today.  Continue atenolol  25 mg daily and Cozaar  25 mg daily.   3.  Mixed hyperlipidemia.  LDL 114 and HDL 46 in October 2025.  Crestor  increased to 10 mg daily at that time.  Keep interval follow-up with Dr. Shona.   4.  Mild aortic stenosis with mean AV gradient 10 mmHg by follow-up echocardiogram in October 2025.  Asymptomatic, will continue observation over time.  Disposition:  Follow up 1  year.  Signed, Jayson JUDITHANN Sierras, M.D., F.A.C.C. [facility] at [facility] "

## 2024-12-25 ENCOUNTER — Ambulatory Visit: Admitting: Cardiology
